# Patient Record
Sex: Female | Born: 1970 | Hispanic: No | Marital: Married | Smoking: Current every day smoker
Health system: Southern US, Community
[De-identification: ages and names within clinical notes are randomized; demographics above are authoritative.]

## PROBLEM LIST (undated history)

## (undated) DIAGNOSIS — N189 Chronic kidney disease, unspecified: Secondary | ICD-10-CM

## (undated) DIAGNOSIS — N3010 Interstitial cystitis (chronic) without hematuria: Secondary | ICD-10-CM

## (undated) DIAGNOSIS — G629 Polyneuropathy, unspecified: Secondary | ICD-10-CM

## (undated) DIAGNOSIS — D649 Anemia, unspecified: Secondary | ICD-10-CM

## (undated) DIAGNOSIS — J45909 Unspecified asthma, uncomplicated: Secondary | ICD-10-CM

## (undated) DIAGNOSIS — J189 Pneumonia, unspecified organism: Secondary | ICD-10-CM

## (undated) DIAGNOSIS — F1010 Alcohol abuse, uncomplicated: Secondary | ICD-10-CM

## (undated) DIAGNOSIS — I7300 Raynaud's syndrome without gangrene: Secondary | ICD-10-CM

## (undated) DIAGNOSIS — F552 Abuse of laxatives: Secondary | ICD-10-CM

## (undated) DIAGNOSIS — K219 Gastro-esophageal reflux disease without esophagitis: Secondary | ICD-10-CM

## (undated) HISTORY — PX: LEG SURGERY: SHX1003

## (undated) HISTORY — PX: FRACTURE SURGERY: SHX138

## (undated) HISTORY — PX: ESOPHAGOGASTRODUODENOSCOPY: SHX1529

## (undated) HISTORY — PX: COLONOSCOPY: SHX174

---

## 2014-11-18 DIAGNOSIS — N3010 Interstitial cystitis (chronic) without hematuria: Secondary | ICD-10-CM | POA: Diagnosis present

## 2014-12-31 DIAGNOSIS — F552 Abuse of laxatives: Secondary | ICD-10-CM | POA: Diagnosis present

## 2018-09-03 DIAGNOSIS — K5901 Slow transit constipation: Secondary | ICD-10-CM | POA: Insufficient documentation

## 2019-07-04 ENCOUNTER — Other Ambulatory Visit: Payer: Self-pay

## 2019-07-04 ENCOUNTER — Ambulatory Visit: Payer: Self-pay | Attending: Internal Medicine

## 2019-07-04 DIAGNOSIS — Z23 Encounter for immunization: Secondary | ICD-10-CM

## 2019-07-04 NOTE — Progress Notes (Signed)
   Covid-19 Vaccination Clinic  Name:  Katherine Perez    MRN: [id number] DOB: 03/15/1971  07/04/2019  [name] was observed post Covid-19 immunization for 15 minutes without incident. She was provided with Vaccine Information Sheet and instruction to access the V-Safe system.   [name] was instructed to call 911 with any severe reactions post vaccine: Marland Kitchen Difficulty breathing  . Swelling of face and throat  . A fast heartbeat  . A bad rash all over body  . Dizziness and weakness   Immunizations Administered    Name Date Dose VIS Date Route   Pfizer COVID-19 Vaccine 07/04/2019 10:01 AM 0.3 mL 04/03/2019 Intramuscular   Manufacturer: [place]   Lot: [id number]   [place]: [address]-0

## 2019-07-28 ENCOUNTER — Other Ambulatory Visit: Payer: Self-pay

## 2019-07-28 ENCOUNTER — Ambulatory Visit: Payer: Self-pay | Attending: Internal Medicine

## 2019-07-28 DIAGNOSIS — Z23 Encounter for immunization: Secondary | ICD-10-CM

## 2019-07-28 NOTE — Progress Notes (Signed)
   Covid-19 Vaccination Clinic  Name:  Katherine Perez    MRN: [id number] DOB: [birth year]  07/28/2019  Katherine Perez was observed post Covid-19 immunization for 15 minutes without incident. She was provided with Vaccine Information Sheet and instruction to access the V-Safe system.   Katherine Perez was instructed to call 911 with any severe reactions post vaccine: Marland Kitchen Difficulty breathing  . Swelling of face and throat  . A fast heartbeat  . A bad rash all over body  . Dizziness and weakness   Immunizations Administered    Name Date Dose VIS Date Route   Pfizer COVID-19 Vaccine 07/28/2019  3:12 PM 0.3 mL 04/03/2019 Intramuscular   Manufacturer: [place]   Lot: [id number]   [place]: [id number]

## 2019-10-26 ENCOUNTER — Other Ambulatory Visit: Payer: Self-pay

## 2019-10-26 ENCOUNTER — Inpatient Hospital Stay
Admission: EM | Admit: 2019-10-26 | Discharge: 2019-11-03 | DRG: 871 | Disposition: A | Discharge status: Home Health | Payer: BC Managed Care – PPO | Attending: Internal Medicine | Admitting: Internal Medicine

## 2019-10-26 ENCOUNTER — Emergency Department: Payer: BC Managed Care – PPO

## 2019-10-26 ENCOUNTER — Encounter: Payer: Self-pay | Admitting: Emergency Medicine

## 2019-10-26 ENCOUNTER — Inpatient Hospital Stay: Payer: BC Managed Care – PPO

## 2019-10-26 DIAGNOSIS — D72829 Elevated white blood cell count, unspecified: Secondary | ICD-10-CM | POA: Diagnosis not present

## 2019-10-26 DIAGNOSIS — Z79891 Long term (current) use of opiate analgesic: Secondary | ICD-10-CM

## 2019-10-26 DIAGNOSIS — N184 Chronic kidney disease, stage 4 (severe): Secondary | ICD-10-CM | POA: Diagnosis present

## 2019-10-26 DIAGNOSIS — E876 Hypokalemia: Secondary | ICD-10-CM | POA: Diagnosis present

## 2019-10-26 DIAGNOSIS — B961 Klebsiella pneumoniae [K. pneumoniae] as the cause of diseases classified elsewhere: Secondary | ICD-10-CM | POA: Diagnosis not present

## 2019-10-26 DIAGNOSIS — E872 Acidosis: Secondary | ICD-10-CM | POA: Diagnosis present

## 2019-10-26 DIAGNOSIS — J690 Pneumonitis due to inhalation of food and vomit: Secondary | ICD-10-CM | POA: Diagnosis present

## 2019-10-26 DIAGNOSIS — T39395A Adverse effect of other nonsteroidal anti-inflammatory drugs [NSAID], initial encounter: Secondary | ICD-10-CM | POA: Diagnosis present

## 2019-10-26 DIAGNOSIS — Z23 Encounter for immunization: Secondary | ICD-10-CM | POA: Diagnosis not present

## 2019-10-26 DIAGNOSIS — N179 Acute kidney failure, unspecified: Secondary | ICD-10-CM | POA: Diagnosis present

## 2019-10-26 DIAGNOSIS — Z681 Body mass index (BMI) 19 or less, adult: Secondary | ICD-10-CM | POA: Diagnosis not present

## 2019-10-26 DIAGNOSIS — K219 Gastro-esophageal reflux disease without esophagitis: Secondary | ICD-10-CM | POA: Diagnosis present

## 2019-10-26 DIAGNOSIS — E871 Hypo-osmolality and hyponatremia: Secondary | ICD-10-CM | POA: Diagnosis present

## 2019-10-26 DIAGNOSIS — F17210 Nicotine dependence, cigarettes, uncomplicated: Secondary | ICD-10-CM | POA: Diagnosis present

## 2019-10-26 DIAGNOSIS — Z20822 Contact with and (suspected) exposure to covid-19: Secondary | ICD-10-CM | POA: Diagnosis present

## 2019-10-26 DIAGNOSIS — R319 Hematuria, unspecified: Secondary | ICD-10-CM | POA: Diagnosis not present

## 2019-10-26 DIAGNOSIS — E875 Hyperkalemia: Secondary | ICD-10-CM | POA: Diagnosis not present

## 2019-10-26 DIAGNOSIS — R7881 Bacteremia: Secondary | ICD-10-CM

## 2019-10-26 DIAGNOSIS — A4159 Other Gram-negative sepsis: Principal | ICD-10-CM | POA: Diagnosis present

## 2019-10-26 DIAGNOSIS — N3011 Interstitial cystitis (chronic) with hematuria: Secondary | ICD-10-CM | POA: Diagnosis present

## 2019-10-26 DIAGNOSIS — J45909 Unspecified asthma, uncomplicated: Secondary | ICD-10-CM | POA: Diagnosis present

## 2019-10-26 DIAGNOSIS — K828 Other specified diseases of gallbladder: Secondary | ICD-10-CM | POA: Diagnosis present

## 2019-10-26 DIAGNOSIS — D509 Iron deficiency anemia, unspecified: Secondary | ICD-10-CM | POA: Diagnosis present

## 2019-10-26 DIAGNOSIS — B370 Candidal stomatitis: Secondary | ICD-10-CM | POA: Diagnosis present

## 2019-10-26 DIAGNOSIS — Z1624 Resistance to multiple antibiotics: Secondary | ICD-10-CM | POA: Diagnosis present

## 2019-10-26 DIAGNOSIS — E43 Unspecified severe protein-calorie malnutrition: Secondary | ICD-10-CM | POA: Diagnosis present

## 2019-10-26 DIAGNOSIS — N1831 Chronic kidney disease, stage 3a: Secondary | ICD-10-CM | POA: Diagnosis present

## 2019-10-26 DIAGNOSIS — G9341 Metabolic encephalopathy: Secondary | ICD-10-CM | POA: Diagnosis present

## 2019-10-26 DIAGNOSIS — E8729 Other acidosis: Secondary | ICD-10-CM | POA: Diagnosis present

## 2019-10-26 DIAGNOSIS — Z888 Allergy status to other drugs, medicaments and biological substances status: Secondary | ICD-10-CM

## 2019-10-26 DIAGNOSIS — F552 Abuse of laxatives: Secondary | ICD-10-CM | POA: Diagnosis present

## 2019-10-26 DIAGNOSIS — N390 Urinary tract infection, site not specified: Secondary | ICD-10-CM | POA: Diagnosis not present

## 2019-10-26 DIAGNOSIS — Z881 Allergy status to other antibiotic agents status: Secondary | ICD-10-CM

## 2019-10-26 DIAGNOSIS — N189 Chronic kidney disease, unspecified: Secondary | ICD-10-CM | POA: Diagnosis not present

## 2019-10-26 DIAGNOSIS — K5909 Other constipation: Secondary | ICD-10-CM | POA: Diagnosis present

## 2019-10-26 DIAGNOSIS — E860 Dehydration: Secondary | ICD-10-CM | POA: Diagnosis present

## 2019-10-26 DIAGNOSIS — Z1612 Extended spectrum beta lactamase (ESBL) resistance: Secondary | ICD-10-CM | POA: Diagnosis present

## 2019-10-26 DIAGNOSIS — Z87440 Personal history of urinary (tract) infections: Secondary | ICD-10-CM

## 2019-10-26 DIAGNOSIS — R109 Unspecified abdominal pain: Secondary | ICD-10-CM

## 2019-10-26 DIAGNOSIS — L8990 Pressure ulcer of unspecified site, unspecified stage: Secondary | ICD-10-CM | POA: Insufficient documentation

## 2019-10-26 DIAGNOSIS — L89151 Pressure ulcer of sacral region, stage 1: Secondary | ICD-10-CM | POA: Diagnosis present

## 2019-10-26 DIAGNOSIS — Z79899 Other long term (current) drug therapy: Secondary | ICD-10-CM

## 2019-10-26 DIAGNOSIS — Z7951 Long term (current) use of inhaled steroids: Secondary | ICD-10-CM

## 2019-10-26 DIAGNOSIS — R471 Dysarthria and anarthria: Secondary | ICD-10-CM | POA: Diagnosis present

## 2019-10-26 DIAGNOSIS — R6520 Severe sepsis without septic shock: Secondary | ICD-10-CM | POA: Diagnosis present

## 2019-10-26 DIAGNOSIS — I7300 Raynaud's syndrome without gangrene: Secondary | ICD-10-CM | POA: Diagnosis present

## 2019-10-26 DIAGNOSIS — Z7289 Other problems related to lifestyle: Secondary | ICD-10-CM

## 2019-10-26 HISTORY — DX: Chronic kidney disease, unspecified: N18.9

## 2019-10-26 HISTORY — DX: Anemia, unspecified: D64.9

## 2019-10-26 HISTORY — DX: Polyneuropathy, unspecified: G62.9

## 2019-10-26 HISTORY — DX: Alcohol abuse, uncomplicated: F10.10

## 2019-10-26 HISTORY — DX: Interstitial cystitis (chronic) without hematuria: N30.10

## 2019-10-26 HISTORY — DX: Abuse of laxatives: F55.2

## 2019-10-26 HISTORY — DX: Gastro-esophageal reflux disease without esophagitis: K21.9

## 2019-10-26 HISTORY — DX: Raynaud's syndrome without gangrene: I73.00

## 2019-10-26 LAB — CBC WITH DIFFERENTIAL/PLATELET
Abs Immature Granulocytes: 1.73 10*3/uL — ABNORMAL HIGH (ref 0.00–0.07)
Basophils Absolute: 0 10*3/uL (ref 0.0–0.1)
Basophils Relative: 0 %
Eosinophils Absolute: 0 10*3/uL (ref 0.0–0.5)
Eosinophils Relative: 0 %
HCT: 33.2 % — ABNORMAL LOW (ref 36.0–46.0)
Hemoglobin: 12.3 g/dL (ref 12.0–15.0)
Immature Granulocytes: 8 %
Lymphocytes Relative: 4 %
Lymphs Abs: 0.9 10*3/uL (ref 0.7–4.0)
MCH: 31.8 pg (ref 26.0–34.0)
MCHC: 37 g/dL — ABNORMAL HIGH (ref 30.0–36.0)
MCV: 85.8 fL (ref 80.0–100.0)
Monocytes Absolute: 1 10*3/uL (ref 0.1–1.0)
Monocytes Relative: 5 %
Neutro Abs: 17.9 10*3/uL — ABNORMAL HIGH (ref 1.7–7.7)
Neutrophils Relative %: 83 %
Platelets: 267 10*3/uL (ref 150–400)
RBC: 3.87 MIL/uL (ref 3.87–5.11)
RDW: 13.2 % (ref 11.5–15.5)
Smear Review: NORMAL
WBC: 21.6 10*3/uL — ABNORMAL HIGH (ref 4.0–10.5)
nRBC: 0.1 % (ref 0.0–0.2)

## 2019-10-26 LAB — HCG, QUANTITATIVE, PREGNANCY: hCG, Beta Chain, Quant, S: 3 m[IU]/mL (ref <5)

## 2019-10-26 LAB — LIPASE, BLOOD: Lipase: 24 U/L (ref 11–51)

## 2019-10-26 LAB — BASIC METABOLIC PANEL
Anion gap: 24 — ABNORMAL HIGH (ref 5–15)
BUN: 134 mg/dL — ABNORMAL HIGH (ref 6–20)
CO2: 7 mmol/L — ABNORMAL LOW (ref 22–32)
Calcium: 7.6 mg/dL — ABNORMAL LOW (ref 8.9–10.3)
Chloride: 87 mmol/L — ABNORMAL LOW (ref 98–111)
Creatinine, Ser: 4.95 mg/dL — ABNORMAL HIGH (ref 0.44–1.00)
GFR calc Af Amer: 11 mL/min — ABNORMAL LOW (ref >60)
GFR calc non Af Amer: 10 mL/min — ABNORMAL LOW (ref >60)
Glucose, Bld: 78 mg/dL (ref 70–99)
Potassium: 3.7 mmol/L (ref 3.5–5.1)
Sodium: 118 mmol/L — CL (ref 135–145)

## 2019-10-26 LAB — COMPREHENSIVE METABOLIC PANEL WITH GFR
ALT: 31 U/L (ref 0–44)
AST: 39 U/L (ref 15–41)
Albumin: 2.9 g/dL — ABNORMAL LOW (ref 3.5–5.0)
Alkaline Phosphatase: 177 U/L — ABNORMAL HIGH (ref 38–126)
Anion gap: 25 — ABNORMAL HIGH (ref 5–15)
BUN: 137 mg/dL — ABNORMAL HIGH (ref 6–20)
CO2: 7 mmol/L — ABNORMAL LOW (ref 22–32)
Calcium: 8.1 mg/dL — ABNORMAL LOW (ref 8.9–10.3)
Chloride: 86 mmol/L — ABNORMAL LOW (ref 98–111)
Creatinine, Ser: 4.88 mg/dL — ABNORMAL HIGH (ref 0.44–1.00)
GFR calc Af Amer: 11 mL/min — ABNORMAL LOW
GFR calc non Af Amer: 10 mL/min — ABNORMAL LOW
Glucose, Bld: 86 mg/dL (ref 70–99)
Potassium: 3.8 mmol/L (ref 3.5–5.1)
Sodium: 118 mmol/L — CL (ref 135–145)
Total Bilirubin: 1.6 mg/dL — ABNORMAL HIGH (ref 0.3–1.2)
Total Protein: 7.4 g/dL (ref 6.5–8.1)

## 2019-10-26 LAB — TROPONIN I (HIGH SENSITIVITY)
Troponin I (High Sensitivity): 28 ng/L — ABNORMAL HIGH (ref <18)
Troponin I (High Sensitivity): 29 ng/L — ABNORMAL HIGH (ref <18)

## 2019-10-26 LAB — URINALYSIS, COMPLETE (UACMP) WITH MICROSCOPIC
Bilirubin Urine: NEGATIVE
Glucose, UA: NEGATIVE mg/dL
Ketones, ur: 5 mg/dL — AB
Nitrite: POSITIVE — AB
Protein, ur: 30 mg/dL — AB
Specific Gravity, Urine: 1.008 (ref 1.005–1.030)
Squamous Epithelial / HPF: NONE SEEN (ref 0–5)
WBC, UA: >50 WBC/hpf — ABNORMAL HIGH (ref 0–5)
pH: 5 (ref 5.0–8.0)

## 2019-10-26 LAB — MAGNESIUM: Magnesium: 3 mg/dL — ABNORMAL HIGH (ref 1.7–2.4)

## 2019-10-26 LAB — SODIUM: Sodium: 118 mmol/L — CL (ref 135–145)

## 2019-10-26 LAB — LACTIC ACID, PLASMA
Lactic Acid, Venous: 0.9 mmol/L (ref 0.5–1.9)
Lactic Acid, Venous: 0.7 mmol/L (ref 0.5–1.9)

## 2019-10-26 LAB — ETHANOL: Alcohol, Ethyl (B): <10 mg/dL (ref <10)

## 2019-10-26 LAB — OSMOLALITY, URINE: Osmolality, Ur: 233 mosm/kg — ABNORMAL LOW (ref 300–900)

## 2019-10-26 LAB — SARS CORONAVIRUS 2 BY RT PCR (HOSPITAL ORDER, PERFORMED IN ~~LOC~~ HOSPITAL LAB): SARS Coronavirus 2: NEGATIVE

## 2019-10-26 LAB — AMMONIA: Ammonia: 16 umol/L (ref 9–35)

## 2019-10-26 LAB — GROUP A STREP BY PCR: Group A Strep by PCR: NOT DETECTED

## 2019-10-26 LAB — ACETAMINOPHEN LEVEL: Acetaminophen (Tylenol), Serum: 11 ug/mL (ref 10–30)

## 2019-10-26 LAB — PROCALCITONIN: Procalcitonin: 30.57 ng/mL

## 2019-10-26 LAB — SODIUM, URINE, RANDOM: Sodium, Ur: 27 mmol/L

## 2019-10-26 LAB — BETA-HYDROXYBUTYRIC ACID: Beta-Hydroxybutyric Acid: 2.23 mmol/L — ABNORMAL HIGH (ref 0.05–0.27)

## 2019-10-26 LAB — OSMOLALITY: Osmolality: 295 mOsm/kg (ref 275–295)

## 2019-10-26 LAB — SALICYLATE LEVEL: Salicylate Lvl: <7 mg/dL — ABNORMAL LOW (ref 7.0–30.0)

## 2019-10-26 LAB — BRAIN NATRIURETIC PEPTIDE: B Natriuretic Peptide: 230 pg/mL — ABNORMAL HIGH (ref 0.0–100.0)

## 2019-10-26 MED ORDER — FAMOTIDINE 20 MG PO TABS
40.0000 mg | ORAL_TABLET | Freq: Every day | ORAL | Status: DC
  Administered 2019-10-26: 40 mg via ORAL
  Filled 2019-10-26: qty 2

## 2019-10-26 MED ORDER — THEOPHYLLINE ER 400 MG PO TB24
400.0000 mg | ORAL_TABLET | Freq: Two times a day (BID) | ORAL | Status: DC
  Administered 2019-10-26: 400 mg via ORAL
  Filled 2019-10-26 (×3): qty 1

## 2019-10-26 MED ORDER — SODIUM BICARBONATE-DEXTROSE 150-5 MEQ/L-% IV SOLN
150.0000 meq | INTRAVENOUS | Status: DC
  Administered 2019-10-26: 150 meq via INTRAVENOUS
  Filled 2019-10-26 (×2): qty 1000

## 2019-10-26 MED ORDER — MOMETASONE FURO-FORMOTEROL FUM 100-5 MCG/ACT IN AERO
2.0000 | INHALATION_SPRAY | Freq: Two times a day (BID) | RESPIRATORY_TRACT | Status: DC
  Administered 2019-10-29 – 2019-11-03 (×11): 2 via RESPIRATORY_TRACT
  Filled 2019-10-26 (×2): qty 8.8

## 2019-10-26 MED ORDER — SODIUM CHLORIDE 0.9 % IV BOLUS
500.0000 mL | Freq: Once | INTRAVENOUS | Status: AC
  Administered 2019-10-26: 500 mL via INTRAVENOUS

## 2019-10-26 MED ORDER — THIAMINE HCL 100 MG PO TABS
100.0000 mg | ORAL_TABLET | Freq: Every day | ORAL | Status: DC
  Administered 2019-10-26 – 2019-11-03 (×8): 100 mg via ORAL
  Filled 2019-10-26 (×8): qty 1

## 2019-10-26 MED ORDER — SODIUM CHLORIDE 0.9 % IV SOLN
3.0000 g | Freq: Two times a day (BID) | INTRAVENOUS | Status: DC
  Administered 2019-10-26 – 2019-10-28 (×4): 3 g via INTRAVENOUS
  Filled 2019-10-26 (×3): qty 8
  Filled 2019-10-26: qty 3
  Filled 2019-10-26: qty 8

## 2019-10-26 MED ORDER — TAMSULOSIN HCL 0.4 MG PO CAPS
0.4000 mg | ORAL_CAPSULE | Freq: Every day | ORAL | Status: DC
  Administered 2019-10-26 – 2019-11-03 (×8): 0.4 mg via ORAL
  Filled 2019-10-26 (×8): qty 1

## 2019-10-26 MED ORDER — FLUCONAZOLE 100MG IVPB
100.0000 mg | INTRAVENOUS | Status: AC
  Administered 2019-10-26 – 2019-11-01 (×7): 100 mg via INTRAVENOUS
  Filled 2019-10-26 (×8): qty 50

## 2019-10-26 MED ORDER — HEPARIN SODIUM (PORCINE) 5000 UNIT/ML IJ SOLN
5000.0000 [IU] | Freq: Three times a day (TID) | INTRAMUSCULAR | Status: DC
  Administered 2019-10-26 – 2019-11-03 (×22): 5000 [IU] via SUBCUTANEOUS
  Filled 2019-10-26 (×22): qty 1

## 2019-10-26 MED ORDER — PLECANATIDE 3 MG PO TABS: 3.0000 mg | ORAL_TABLET | Freq: Every day | ORAL | Status: DC

## 2019-10-26 MED ORDER — SODIUM CHLORIDE 0.9 % IV BOLUS
250.0000 mL | Freq: Once | INTRAVENOUS | Status: AC
  Administered 2019-10-26: 250 mL via INTRAVENOUS

## 2019-10-26 MED ORDER — SODIUM CHLORIDE 0.45 % IV SOLN: INTRAVENOUS | Status: DC

## 2019-10-26 MED ORDER — SODIUM CHLORIDE 0.9% FLUSH
3.0000 mL | Freq: Two times a day (BID) | INTRAVENOUS | Status: DC
  Administered 2019-10-28 – 2019-11-01 (×7): 3 mL via INTRAVENOUS

## 2019-10-26 MED ORDER — NICOTINE 21 MG/24HR TD PT24
21.0000 mg | MEDICATED_PATCH | Freq: Every day | TRANSDERMAL | Status: DC
  Administered 2019-10-28 – 2019-11-03 (×7): 21 mg via TRANSDERMAL
  Filled 2019-10-26 (×8): qty 1

## 2019-10-26 MED ORDER — ONDANSETRON HCL 4 MG/2ML IJ SOLN: 4.0000 mg | Freq: Four times a day (QID) | INTRAMUSCULAR | Status: DC | PRN

## 2019-10-26 MED ORDER — ACETAMINOPHEN 325 MG PO TABS
650.0000 mg | ORAL_TABLET | Freq: Four times a day (QID) | ORAL | Status: DC | PRN
  Administered 2019-10-29: 650 mg via ORAL
  Filled 2019-10-26: qty 2

## 2019-10-26 MED ORDER — THIAMINE HCL 100 MG/ML IJ SOLN
100.0000 mg | Freq: Every day | INTRAMUSCULAR | Status: DC
  Administered 2019-10-27: 100 mg via INTRAVENOUS
  Filled 2019-10-26 (×2): qty 2

## 2019-10-26 MED ORDER — SODIUM CHLORIDE 0.9 % IV SOLN
1.0000 g | Freq: Once | INTRAVENOUS | Status: AC
  Administered 2019-10-26: 1 g via INTRAVENOUS
  Filled 2019-10-26: qty 1

## 2019-10-26 MED ORDER — ONDANSETRON HCL 4 MG PO TABS: 4.0000 mg | ORAL_TABLET | Freq: Four times a day (QID) | ORAL | Status: DC | PRN

## 2019-10-26 MED ORDER — LORAZEPAM 2 MG/ML IJ SOLN
1.0000 mg | INTRAMUSCULAR | Status: AC | PRN
  Administered 2019-10-27: 4 mg via INTRAVENOUS
  Filled 2019-10-26 (×2): qty 1

## 2019-10-26 MED ORDER — ADULT MULTIVITAMIN W/MINERALS CH
1.0000 | ORAL_TABLET | Freq: Every day | ORAL | Status: DC
  Administered 2019-10-26 – 2019-11-03 (×8): 1 via ORAL
  Filled 2019-10-26 (×8): qty 1

## 2019-10-26 MED ORDER — ALBUTEROL SULFATE (2.5 MG/3ML) 0.083% IN NEBU: 2.5000 mg | INHALATION_SOLUTION | RESPIRATORY_TRACT | Status: DC | PRN

## 2019-10-26 MED ORDER — FLUTICASONE PROPIONATE 50 MCG/ACT NA SUSP
2.0000 | Freq: Two times a day (BID) | NASAL | Status: DC
  Administered 2019-10-28 – 2019-11-03 (×12): 2 via NASAL
  Filled 2019-10-26 (×2): qty 16

## 2019-10-26 MED ORDER — ACETAMINOPHEN 650 MG RE SUPP: 650.0000 mg | Freq: Four times a day (QID) | RECTAL | Status: DC | PRN

## 2019-10-26 MED ORDER — SODIUM CHLORIDE 0.9 % IV SOLN: 2.0000 g | INTRAVENOUS | Status: DC

## 2019-10-26 MED ORDER — FOLIC ACID 1 MG PO TABS
1.0000 mg | ORAL_TABLET | Freq: Every day | ORAL | Status: DC
  Administered 2019-10-26 – 2019-11-03 (×8): 1 mg via ORAL
  Filled 2019-10-26 (×8): qty 1

## 2019-10-26 MED ORDER — LORAZEPAM 1 MG PO TABS
1.0000 mg | ORAL_TABLET | ORAL | Status: AC | PRN
  Administered 2019-10-26: 1 mg via ORAL
  Filled 2019-10-26: qty 1

## 2019-10-26 MED ORDER — PANTOPRAZOLE SODIUM 40 MG PO TBEC
40.0000 mg | DELAYED_RELEASE_TABLET | Freq: Every day | ORAL | Status: DC
  Administered 2019-10-26 – 2019-11-03 (×8): 40 mg via ORAL
  Filled 2019-10-26 (×8): qty 1

## 2019-10-26 NOTE — ED Triage Notes (Addendum)
Pt confused.  Mild dysarthria noted.  Pt feels has difficulty finding words at times.  fingers noted to be purple in color, when asked does have hx raynaud's. Pt poor historian, called husband to attempt to obtain symptoms and history; pt is [facility].  Here for [facility] per husband and confusion along with difficulty speaking.  Has been in bed past few days and not eating/drinkign well. 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  + fatigue.  Difficulty to obtain current history r/t pt mental status. Pt drinks 4-5 drinks per week per husband report. Skin color hard tell, pt uses lotion tanner.

## 2019-10-26 NOTE — Consult Note (Addendum)
[address] [place], [place] [address] Phone [phone]. Fax [phone]  Date: 10/26/2019                  Patient Name:  Katherine Perez  MRN: [id number]  DOB: 1970-10-03  Age / Sex: 49 y.o., female         PCP: Patient, No Pcp Per                 Service Requesting Consult: IM/ Modena Jansky, MD                 Reason for Consult: ARF            History of Present Illness: Patient is a 49 y.o. female with medical problems of multiple medical problems, who was admitted to [facility] on 10/26/2019 for evaluation of confusion and dysarthria.   Presented for dysarthryia and word finding difficulty. Fingers noted to be purple. Also concern of Shortness of breath and confusion. H/o laxative abuse. Ongoing alcohol abuse - 4-5 drinks per husband H/o colonic intertia, chronic constipation, pelvic floor dysfuncion, gastroparesis, malnutrition  + NSAIDs- diclonofenac-misoprostol started 09/25/2019  Most information is obtained from patient's husband and chart and primary team.  Patient is not able to provide a lot of information as she feels sick and had confusion earlier today.  Husband reports that she has improved; was much more confused this morning  Multiple lab abnormalities noted including severe acidosis, ARB and hyponatremia     Medications: Outpatient medications: (Not in a hospital admission)   Current medications: Current Facility-Administered Medications  Medication Dose Route Frequency Provider Last Rate Last Admin  . 0.45 % sodium chloride infusion   Intravenous Continuous Hongalgi, Anand D, MD      . acetaminophen (TYLENOL) tablet 650 mg  650 mg Oral Q6H PRN Hongalgi, Lenis Dickinson, MD       Or  . acetaminophen (TYLENOL) suppository 650 mg  650 mg Rectal Q6H PRN Hongalgi, Anand D, MD      . albuterol (PROVENTIL) (2.5 MG/3ML) 0.083% nebulizer solution 2.5 mg  2.5 mg Nebulization Q2H PRN Hongalgi, Anand D, MD      . heparin injection 5,000 Units  5,000 Units  Subcutaneous Q8H Hongalgi, Anand D, MD      . nicotine (NICODERM CQ - dosed in mg/24 hours) patch 21 mg  21 mg Transdermal Daily Hongalgi, Anand D, MD      . ondansetron (ZOFRAN) tablet 4 mg  4 mg Oral Q6H PRN Hongalgi, Lenis Dickinson, MD       Or  . ondansetron (ZOFRAN) injection 4 mg  4 mg Intravenous Q6H PRN Hongalgi, Anand D, MD      . sodium chloride flush (NS) 0.9 % injection 3 mL  3 mL Intravenous Q12H Hongalgi, Lenis Dickinson, MD       Current Outpatient Medications  Medication Sig Dispense Refill  . Calcium Polycarbophil (FIBER) 625 MG TABS Take 2 capsules by mouth 2 (two) times daily.    . Diclofenac-miSOPROStol 75-0.2 MG TBEC Take 1 tablet by mouth in the morning, at noon, and at bedtime.     . famotidine (PEPCID) 40 MG tablet Take 40 mg by mouth daily.    . fluticasone (FLONASE) 50 MCG/ACT nasal spray Place 2 sprays into both nostrils 2 (two) times daily.     . Fluticasone-Salmeterol (ADVAIR) 100-50 MCG/DOSE AEPB Inhale 1 puff into the lungs 2 (two) times daily.    . Fluticasone-Salmeterol (ADVAIR) 250-50 MCG/DOSE AEPB Inhale  1 puff into the lungs 2 (two) times daily.    Marland Kitchen gabapentin (NEURONTIN) 300 MG capsule Take 300 mg by mouth 5 (five) times daily as needed.    . metoCLOPramide (REGLAN) 10 MG tablet Take 20 mg by mouth in the morning and at bedtime.     . ondansetron (ZOFRAN) 8 MG tablet Take 16 mg by mouth 2 (two) times daily.     . promethazine (PHENERGAN) 25 MG tablet Take 25 mg by mouth every 8 (eight) hours as needed for nausea or vomiting.     . RABEprazole (ACIPHEX) 20 MG tablet Take 20 mg by mouth in the morning and at bedtime.     . tamsulosin (FLOMAX) 0.4 MG CAPS capsule Take 0.4 mg by mouth daily.    . theophylline (UNIPHYL) 400 MG 24 hr tablet Take 400 mg by mouth 2 (two) times daily.     Marland Kitchen trimethoprim (TRIMPEX) 100 MG tablet Take 100 mg by mouth daily.    . TRULANCE 3 MG TABS Take 3 mg by mouth daily at 6 (six) AM.     . albuterol (VENTOLIN HFA) 108 (90 Base) MCG/ACT inhaler  Inhale 2 puffs into the lungs every 4 (four) hours as needed.    . fexofenadine (ALLEGRA) 180 MG tablet Take 180 mg by mouth daily as needed for allergies.    Marland Kitchen psyllium (FIBER LAXATIVE) 0.52 g capsule Take 2 capsules by mouth 2 (two) times daily. (Patient not taking: Reported on 10/26/2019)    . traMADol (ULTRAM) 50 MG tablet Take 50 mg by mouth 3 (three) times daily as needed.        Allergies: Allergies  Allergen Reactions  . Cetirizine Hives  . Diazepam Other (See Comments)    Depression   . Baclofen Anxiety and Other (See Comments)    AMS - "Really messed me up, caused me to drop things"       Past Medical History: Past Medical History:  Diagnosis Date  . Anemia   . CKD (chronic kidney disease)   . ETOH abuse   . GERD (gastroesophageal reflux disease)   . IC (interstitial cystitis)   . Laxative abuse   . Neuropathy   . Raynaud disease      Past Surgical History: Past Surgical History:  Procedure Laterality Date  . LEG SURGERY       Family History: History reviewed. No pertinent family history.   Social History: Social History   Socioeconomic History  . Marital status: Married    Spouse name: Not on file  . Number of children: Not on file  . Years of education: Not on file  . Highest education level: Not on file  Occupational History  . Not on file  Tobacco Use  . Smoking status: Current Every Day Smoker  . Smokeless tobacco: Never Used  Substance and Sexual Activity  . Alcohol use: Yes    Comment: 4-5 drink per week  . Drug use: Not Currently  . Sexual activity: Not on file  Other Topics Concern  . Not on file  Social History Narrative  . Not on file   Social Determinants of Health   Financial Resource Strain:   . Difficulty of Paying Living Expenses:   Food Insecurity:   . Worried About Charity fundraiser in the Last Year:   . Arboriculturist in the Last Year:   Transportation Needs:   . Film/video editor (Medical):   Marland Kitchen Lack of  Transportation (  Non-Medical):   Physical Activity:   . Days of Exercise per Week:   . Minutes of Exercise per Session:   Stress:   . Feeling of Stress :   Social Connections:   . Frequency of Communication with Friends and Family:   . Frequency of Social Gatherings with Friends and Family:   . Attends Religious Services:   . Active Member of Clubs or Organizations:   . Attends Archivist Meetings:   Marland Kitchen Marital Status:   Intimate Partner Violence:   . Fear of Current or Ex-Partner:   . Emotionally Abused:   Marland Kitchen Physically Abused:   . Sexually Abused:      Review of Systems: not reliable Gen:  HEENT:  CV:  Resp:  GI: GU :  MS:  Derm:    Psych: Heme:  Neuro:  Endocrine  Vital Signs: Blood pressure 125/64, pulse (!) 105, temperature 97.7 F (36.5 C), temperature source Oral, resp. rate 18, height 5\' 9"  (1.753 m), weight 63.5 kg, SpO2 100 %.   Intake/Output Summary (Last 24 hours) at 10/26/2019 1646 Last data filed at 10/26/2019 1459 Gross per 24 hour  Intake 250 ml  Output --  Net 250 ml    Weight trends: [employer]   10/26/19 1128  Weight: 63.5 kg    Physical Exam: General:  alert, NAD  HEENT Dry oral mucus membranes  Neck:  supple, no JVD  Lungs: Coarse breath sounds b/l, room air  Heart::  regular  Abdomen: Soft, mild diffuse tenderness  Extremities:  no edema  Neurologic: Alert, able to answer questions appropriately  Skin: No acute rashes, fingertip cyanosis    Lab results: Basic Metabolic Panel: Recent Labs  Lab 10/26/19 1150 10/26/19 1303 10/26/19 1545  NA 118* 118* 118*  K 3.8  --  3.7  CL 86*  --  87*  CO2 7*  --  7*  GLUCOSE 86  --  78  BUN 137*  --  134*  CREATININE 4.88*  --  4.95*  CALCIUM 8.1*  --  7.6*  MG 3.0*  --   --     Liver Function Tests: Recent Labs  Lab 10/26/19 1150  AST 39  ALT 31  ALKPHOS 177*  BILITOT 1.6*  PROT 7.4  ALBUMIN 2.9*   Recent Labs  Lab 10/26/19 1150  LIPASE 24   Recent Labs   Lab 10/26/19 1150  AMMONIA 16    CBC: Recent Labs  Lab 10/26/19 1150  WBC 21.6*  NEUTROABS 17.9*  HGB 12.3  HCT 33.2*  MCV 85.8  PLT 267    Cardiac Enzymes: No results for input(s): CKTOTAL, TROPONINI in the last 168 hours.  BNP: Invalid input(s): POCBNP  CBG: No results for input(s): GLUCAP in the last 168 hours.  Microbiology: Recent Results (from the past 720 hour(s))  Group A Strep by PCR ([facility] Only)     Status: None   Collection Time: 10/26/19  1:20 PM   Specimen: Throat; Sterile Swab  Result Value Ref Range Status   Group A Strep by PCR NOT DETECTED NOT DETECTED Final    Comment: Performed at [address] [facility], [address] [place], [place] [address]  SARS Coronavirus 2 by RT PCR (hospital order, performed in [facility] hospital lab) Nasopharyngeal Nasopharyngeal Swab     Status: None   Collection Time: 10/26/19  1:40 PM   Specimen: Nasopharyngeal Swab  Result Value Ref Range Status   SARS Coronavirus 2 NEGATIVE NEGATIVE Final  Comment: (NOTE) SARS-CoV-2 target nucleic acids are NOT DETECTED.  The SARS-CoV-2 RNA is generally detectable in upper and lower respiratory specimens during the acute phase of infection. The lowest concentration of SARS-CoV-2 viral copies this assay can detect is 250 copies / mL. A negative result does not preclude SARS-CoV-2 infection and should not be used as the sole basis for treatment or other patient management decisions.  A negative result may occur with improper specimen collection / handling, submission of specimen other than nasopharyngeal swab, presence of viral mutation(s) within the areas targeted by this assay, and inadequate number of viral copies (<250 copies / mL). A negative result must be combined with clinical observations, patient history, and epidemiological information.  Fact Sheet for Patients:   StrictlyIdeas.no  Fact Sheet for Healthcare  Providers: BankingDealers.co.za  This test is not yet approved or  cleared by the [place] FDA and has been authorized for detection and/or diagnosis of SARS-CoV-2 by FDA under an Emergency Use Authorization (EUA).  This EUA will remain in effect (meaning this test can be used) for the duration of the COVID-19 declaration under Section 564(b)(1) of the Act, 21 U.S.C. section 360bbb-3(b)(1), unless the authorization is terminated or revoked sooner.  Performed at [facility], [address] [place], [place] [address]      Coagulation Studies: No results for input(s): LABPROT, INR in the last 72 hours.  Urinalysis: Recent Labs    10/26/19 1231  COLORURINE BIOCHEMICALS MAY BE AFFECTED BY COLOR*  LABSPEC 1.008  PHURINE 5.0  GLUCOSEU NEGATIVE  HGBUR MODERATE*  BILIRUBINUR NEGATIVE  KETONESUR 5*  PROTEINUR 30*  NITRITE POSITIVE*  LEUKOCYTESUR LARGE*        Imaging: DG Chest 2 View  Result Date: 10/26/2019 CLINICAL DATA:  Shortness of breath, slurred speech and dizziness. EXAM: CHEST - 2 VIEW COMPARISON:  None FINDINGS: Trachea midline. Cardiomediastinal contours and hilar structures are normal. Mild increased interstitial change in the lingula and likely in RIGHT middle lobe. No lobar level consolidative change.  No sign of pleural effusion. On limited assessment skeletal structures are unremarkable. IMPRESSION: Mild increased interstitial prominence without lobar consolidation predominantly in lingula and RIGHT middle lobe lower lobe. Correlate with any history of chronic infection. Findings could also be due to mild and or developing pneumonitis would also correlate for any risk factors for aspiration. Electronically Signed   By: Zetta Bills M.D.   On: 10/26/2019 12:54   CT Head Wo Contrast  Result Date: 10/26/2019 CLINICAL DATA:  Mental status change and dysarthria. EXAM: CT HEAD WITHOUT CONTRAST TECHNIQUE: Contiguous axial images were  obtained from the base of the skull through the vertex without intravenous contrast. COMPARISON:  None. FINDINGS: Brain: No evidence of acute infarction, hemorrhage, hydrocephalus, extra-axial collection or mass lesion/mass effect. Vascular: No hyperdense vessel or unexpected calcification. Skull: Normal. Negative for fracture or focal lesion. Sinuses/Orbits: No acute finding. Other: None. IMPRESSION: No focal acute intracranial abnormality identified. Electronically Signed   By: Abelardo Diesel M.D.   On: 10/26/2019 13:06     Assessment & Plan: Pt is a 49 y.o. 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, pelvic floor dysfunction, acid reflux, gastroparesis, iron deficiency anemia, malnutrition, alcohol abuse, tobacco abuse, anemia, asthma, chronic kidney disease, laxative abuse, recurrent UTIs, interstitial cystitis, Raynaud's disease, was admitted on 10/26/2019 with Hyponatremia [E87.1]   # ARF # Hyponatremia # Substance abuse - alcohol # heavy smoker 1.5 ppd # severe acidosis # pyuria (chronic interstitial cystitis)  ARF is likely multifactorial from volume depletion, possibly underlying infection and NSAIDs No recent iv contrast exposure -Baseline Cr 1.40/ GFR 45 from 09/23/2019 -obtain US renal - gentle volume repletion -Hold NSAIDs, avoid hypotension Hyponatremia is also multifactorial with contribution from volume depletion, alcohol abuse. Other ddx includes SIADH -Patient has received IV normal saline boluses but sodium has not improved - will correct underlying dehydration and treat with iv bicarb to correct acidosis - monitor Na frequently -goal of correction upto 124-126 by AM - if Na starts to go lower, would add 3% saline Withdrawal precautions as per ICU team Possible aspiration pneumonia treatment as per ICU Send urine for culture - patient has h/o interstitial cystitis- treat only if culture positive     LOS: 0 Patrik Turnbaugh Candiss Norse 7/5/20214:46  PM    Note: This note was prepared with Dragon dictation. Any transcription errors are unintentional

## 2019-10-26 NOTE — H&P (Addendum)
History and Physical    Katherine Perez ZSW:[id number] DOB: Dec 05, 1970 DOA: 10/26/2019  PCP: Patient, No Pcp Per   I have briefly reviewed patients previous medical reports in [facility].  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  Abuse, anemia, anorexia, asthma, stage III CKD, laxative abuse, recurrent UTI/interstitial cystitis, Raynaud's disease, ongoing tobacco abuse presented to [facility] ED with above complaints.  Patient and spouse at bedside provided history.  Patient has chronic GI symptoms but otherwise was in her usual state of health until 5 days ago when she started having decreased oral intake of both liquids and foods, decreased energy, progressive lethargy, mostly stayed in bed.  Both patient and spouse deny that she has been vomiting spontaneously or induced.  No history of diarrhea.  Chronic abdominal pain without change.  2 days PTA she noticed dyspnea, weak voice, mostly dry cough, chills but no fevers.  No chest pains.  Since last night spouse noticed that patient has been confused but no auditory or visual hallucinations.  No agitation.  Patient and spouse both deny her having suicidal or homicidal ideations.  Due to progressive symptoms as above, they presented to the ED for further evaluation and management  ED Course: Afebrile, not tachypneic, transient mild tachycardia, BP in the 150s/60s, not hypoxic.  Lab work shows multiple severe significant abnormalities: Sodium 118, chloride 86, bicarbonate 7, BUN 137,  creatinine 4.88 (1.4 about 4 weeks ago), anion gap 25, magnesium 3, lipase normal.  WBC 21.6, neutrophils 17.9.  BNP 230.  Troponin XX 8 > 29.  Lactate x2 normal.  Serum osmolarity 295.  Procalcitonin 30.57.  Beta hydroxybutyrate 2.23.  COVID-19 testing pending.  Urine microscopy: Large leukocyte, positive for nitrites, rare bacteria and >50 WBCs.  Group A strep PCR negative.  Urine culture pending.  And: No focal acute intracranial abnormality identified.  Chest x-ray: Mild increased interstitial prominence without lobar consolidation predominantly in lingula and right middle lobe lower lobe.  Review of Systems:  All other systems reviewed and apart from HPI, are negative.  Past Medical History:  Diagnosis Date  . Anemia   . CKD (chronic kidney disease)   . ETOH abuse   . GERD (gastroesophageal reflux disease)   . IC (interstitial cystitis)   . Laxative abuse   . Neuropathy   . Raynaud disease     Past Surgical History:  Procedure Laterality Date  . LEG SURGERY      Social History  reports that she has been smoking. She has never used smokeless tobacco. She reports current alcohol use. She reports previous drug use.  Allergies  Allergen Reactions  . Cetirizine Hives  . Diazepam Other (See Comments)    Depression   . Baclofen Anxiety and Other (See Comments)    AMS - "Really messed me up, caused me to drop things"     History reviewed. No pertinent family history.   Prior to Admission medications   Medication Sig Start Date End Date Taking? Authorizing Provider  Diclofenac-miSOPROStol 75-0.2 MG TBEC Take 1 tablet by mouth in the morning, at noon, and at bedtime.  09/25/19  Yes [provider]  famotidine (PEPCID) 40 MG tablet Take 40 mg by mouth daily. 10/07/19  Yes [provider]  fluticasone (FLONASE) 50 MCG/ACT nasal spray Place 2 sprays into both nostrils 2 (two) times daily.  09/14/19  Yes [provider]  gabapentin (NEURONTIN) 300 MG capsule  Take 300 mg by mouth 5 (five) times daily as needed. 10/07/19  Yes [provider]  metoCLOPramide (REGLAN) 10 MG tablet Take 20 mg by mouth in the morning and at bedtime.  10/17/19  Yes [provider]  ondansetron (ZOFRAN) 8 MG tablet Take 16 mg by mouth 2 (two) times daily.  10/23/19  Yes [provider]  promethazine (PHENERGAN) 25 MG tablet Take 25 mg by mouth every 8 (eight) hours as needed for nausea or vomiting.  10/23/19  Yes [provider]  RABEprazole (ACIPHEX) 20 MG tablet Take 20 mg by mouth in the morning and at bedtime.  10/12/19  Yes [provider]  tamsulosin (FLOMAX) 0.4 MG CAPS capsule Take 0.4 mg by mouth daily. 10/10/19  Yes [provider]  theophylline (UNIPHYL) 400 MG 24 hr tablet Take 400 mg by mouth 2 (two) times daily.  10/10/19  Yes [provider]  trimethoprim (TRIMPEX) 100 MG tablet Take 100 mg by mouth daily. 10/13/19  Yes [provider]  TRULANCE 3 MG TABS Take 3 mg by mouth daily at 6 (six) AM.  10/12/19  Yes [provider]  traMADol (ULTRAM) 50 MG tablet Take 50 mg by mouth 3 (three) times daily as needed. 10/03/19   [provider]    Physical Exam: Vitals:   10/26/19 1255 10/26/19 1400 10/26/19 1430 10/26/19 1554  BP:  (!) 152/82 (!) 152/66 125/64  Pulse: 93 98 (!) 102 (!) 105  Resp: 12 12 14 18   Temp:      TempSrc:      SpO2: 94% 100% 100% 100%  Weight:      Height:          Constitutional: Young female, moderately built, frail and chronically ill looking lying comfortably propped up in bed. Eyes: PERTLA, lids and conjunctivae normal ENMT: Mucous membranes extremely dry.  Tongue is beefy red. Posterior pharynx also dry and scaly. Normal dentition.  Neck: supple, no masses, no thyromegaly Respiratory: Slightly harsh breath sounds but no wheezing, rhonchi or crackles appreciated.  No increased work of breathing. Cardiovascular: S1 & S2 heard, regular rate and rhythm.  No JVD, murmurs, rubs or clicks. No pedal edema. Abdomen: Non distended. Non tender. Soft. No organomegaly or masses appreciated. No clinical Ascites. Normal bowel sounds heard. Musculoskeletal: no clubbing.  Fingertips are slightly dusky?  Related to her Raynaud's. No joint deformity upper and lower extremities. Good ROM, no contractures. Normal muscle tone.  Skin: no rashes, lesions, ulcers. No induration Neurologic: CN 2-12 grossly intact. Sensation intact, DTR normal. Strength 5/5 in all 4 limbs.  Psychiatric: Alert and oriented to person, place and partly to time.  Slightly impaired insight and judgment at this time.  Pleasant and cooperative..     Labs on Admission: I have personally reviewed following labs and imaging studies  CBC: Recent Labs  Lab 10/26/19 1150  WBC 21.6*  NEUTROABS 17.9*  HGB 12.3  HCT 33.2*  MCV 85.8  PLT 300    Basic Metabolic Panel: Recent Labs  Lab 10/26/19 1150 10/26/19 1303  NA 118* 118*  K 3.8  --   CL 86*  --   CO2 7*  --  GLUCOSE 86  --   BUN 137*  --   CREATININE 4.88*  --   CALCIUM 8.1*  --   MG 3.0*  --     Liver Function Tests: Recent Labs  Lab 10/26/19 1150  AST 39  ALT 31  ALKPHOS 177*  BILITOT 1.6*  PROT 7.4  ALBUMIN 2.9*    Urine analysis:    Component Value Date/Time   COLORURINE BIOCHEMICALS MAY BE AFFECTED BY COLOR (A) 10/26/2019 1231   APPEARANCEUR CLOUDY (A) 10/26/2019 1231   LABSPEC 1.008 10/26/2019 1231   PHURINE 5.0 10/26/2019 1231   GLUCOSEU NEGATIVE 10/26/2019 1231   HGBUR MODERATE (A) 10/26/2019 1231   BILIRUBINUR NEGATIVE 10/26/2019 1231   KETONESUR 5 (A) 10/26/2019 1231   PROTEINUR 30 (A) 10/26/2019 1231   NITRITE POSITIVE (A) 10/26/2019 1231   LEUKOCYTESUR LARGE (A) 10/26/2019 1231     Radiological Exams on Admission: DG Chest 2 View  Result Date: 10/26/2019 CLINICAL DATA:  Shortness of breath, slurred speech and dizziness. EXAM: CHEST - 2 VIEW COMPARISON:  None FINDINGS: Trachea midline.  Cardiomediastinal contours and hilar structures are normal. Mild increased interstitial change in the lingula and likely in RIGHT middle lobe. No lobar level consolidative change.  No sign of pleural effusion. On limited assessment skeletal structures are unremarkable. IMPRESSION: Mild increased interstitial prominence without lobar consolidation predominantly in lingula and RIGHT middle lobe lower lobe. Correlate with any history of chronic infection. Findings could also be due to mild and or developing pneumonitis would also correlate for any risk factors for aspiration. Electronically Signed   By: Zetta Bills M.D.   On: 10/26/2019 12:54   CT Head Wo Contrast  Result Date: 10/26/2019 CLINICAL DATA:  Mental status change and dysarthria. EXAM: CT HEAD WITHOUT CONTRAST TECHNIQUE: Contiguous axial images were obtained from the base of the skull through the vertex without intravenous contrast. COMPARISON:  None. FINDINGS: Brain: No evidence of acute infarction, hemorrhage, hydrocephalus, extra-axial collection or mass lesion/mass effect. Vascular: No hyperdense vessel or unexpected calcification. Skull: Normal. Negative for fracture or focal lesion. Sinuses/Orbits: No acute finding. Other: None. IMPRESSION: No focal acute intracranial abnormality identified. Electronically Signed   By: Abelardo Diesel M.D.   On: 10/26/2019 13:06    EKG: Independently reviewed.  Sinus tachycardia at 104/min, no acute findings, QTC 533 ms  Assessment/Plan Principal Problem:   Hyponatremia Active Problems:   High anion gap metabolic acidosis   Acute kidney injury superimposed on CKD ([place])   Dehydration   Acute metabolic encephalopathy   Leukocytosis     Hyponatremia: Sodium on 6/2: 134.  Presented with sodium of 118.  This could be acute or subacute (likely).  Multifactorial, likely due to dehydration from poor oral intake, ongoing free water intake and unclear if there is any history of intentional emesis or  catharsis.  Patient received normal saline bolus 750x1, repeat BMP still is 118.  IV hypertonic saline was ordered by EDP but canceled.  Patient has mild mental status changes but can carry on simple conversation and no focal deficits.  Ideally should be in ICU due to need for close monitoring and high risk for decompensation.  EDP and I communicated with CCM but unfortunately they have no beds and have couple of other patients still waiting in ED for ICU admission.  Thereby admitting to stepdown unit with frequent neuro checks q. hourly, close BMP monitoring q. hourly until sodium stabilizes.  I consulted critical care and Nephrology.  As discussed with nephrology,  goal is to correct serum sodium no faster than 8 to 10 mEq per 24 hours and would start with IV half normal saline at 75 mL per hour.  Acute on stage III chronic kidney disease: Creatinine was 1.4 on 6/2.  Now presents with creatinine of 4.88.  Most likely secondary to dehydration, home NSAID use, rule out obstruction.  IV fluids and follow BMP closely.  Strict intake output and daily weights.  No indications for acute dialysis.  Nephrology consulted.  Check renal ultrasound.  Hold NSAIDs.  High anion gap metabolic acidosis: Secondary to acute kidney injury and dehydration.  Lactate normal.  Elevated beta hydroxybutyrate.  Treat acute kidney injury as above, monitor BMP q. hourly.  If does not improve then may need to consider a bicarbonate drip.  Suspected aspiration pneumonia: Likely related to mental status changes.  Change to IV Unasyn per pharmacy.  Possible UTI complicating chronic interstitial cystitis: Follow urine culture results.  Changed to IV Unasyn per pharmacy.  Leukocytosis: Likely secondary to pneumonia, UTI and stress response.  Follow CBC daily.  Chronic constipation/anorexia: Bowel regimen for home.  PPI  Asthma: Stable without clinical bronchospasm.  As needed albuterol nebs.  Continue Advair/Dulera, also on  theophylline.  Tobacco abuse: Cessation counseled.  Nicotine patch per request.  Alcohol use: CIWA protocol.  Prolonged QTC: Avoid QT prolonging medications as much as possible.  Follow on telemetry.  Periodically check EKG.  Potassium is 3.8.  Magnesium is 3.   Addendum: Despite saline bolus in ED, sodium persists at 118, nephrology has evaluated and recommend sodium bicarbonate drip for slow correction of low sodium and also the acidosis and indicate that her mental status is already better.  DVT prophylaxis: Subcutaneous heparin Code Status: Full, confirmed with spouse at bedside Family Communication: Patient was evaluated with spouse at bedside at all times.  Updated care and answered questions Disposition Plan:   Patient is from:  Home  Anticipated DC to:  Home  Anticipated DC date:  To be determined  Anticipated DC barriers: To be determined   Consults called: Nephrology, CCM Admission status: Stepdown, inpatient  Severity of Illness: The appropriate patient status for this patient is INPATIENT. Inpatient status is judged to be reasonable and necessary in order to provide the required intensity of service to ensure the patient's safety. The patient's presenting symptoms, physical exam findings, and initial radiographic and laboratory data in the context of their chronic comorbidities is felt to place them at high risk for further clinical deterioration. Furthermore, it is not anticipated that the patient will be medically stable for discharge from the hospital within 2 midnights of admission. The following factors support the patient status of inpatient.   " The patient's presenting symptoms include poor oral intake, decreased energy, lethargy, dyspnea, cough and altered mental. " The worrisome physical exam findings include mucosa, slightly disoriented. " The initial radiographic and laboratory data are worrisome because of sodium 118, creatinine greater than 4, bicarbonate  7. " The chronic co-morbidities include anorexia, chronic constipation, stage III chronic kidney disease.   * I certify that at the point of admission it is my clinical judgment that the patient will require inpatient hospital care spanning beyond 2 midnights from the point of admission due to high intensity of service, high risk for further deterioration and high frequency of surveillance required.Vernell Leep MD [facility]  To contact the attending provider between 7A-7P or the covering provider during after hours 7P-7A, please  log into the web site [email or web address] and access using universal [place] password for that web site. If you do not have the password, please call the hospital operator.  10/26/2019, 4:16 PM

## 2019-10-26 NOTE — Consult Note (Signed)
Pharmacy Antibiotic Note  Katherine Perez is a 49 y.o. female admitted on 10/26/2019 with aspiration pneumoniaand uti.    Pharmacy has been consulted for Cefepime dosing.  Plan: Will dose Cefepime 2g q24h  Height: 5\' 9"  (175.3 cm) Weight: 63.5 kg (140 lb) IBW/kg (Calculated) : 66.2  Temp (24hrs), Avg:97.7 F (36.5 C), Min:97.7 F (36.5 C), Max:97.7 F (36.5 C)  Recent Labs  Lab 10/26/19 1150 10/26/19 1309 10/26/19 1447 10/26/19 1545  WBC 21.6*  --   --   --   CREATININE 4.88*  --   --  4.95*  LATICACIDVEN  --  0.9 0.7  --     Estimated Creatinine Clearance: 13.9 mL/min (A) (by C-G formula based on SCr of 4.95 mg/dL (H)).    Allergies  Allergen Reactions  . Cetirizine Hives  . Diazepam Other (See Comments)    Depression   . Baclofen Anxiety and Other (See Comments)    AMS - "Really messed me up, caused me to drop things"     Antimicrobials this admission: Cefepime 7/5 >>  Dose adjustments this admission: None  Microbiology results: 7/5 BCx: pending 7/5 UCx: pending  COVID NEG Group A Strep not detected  Thank you for allowing pharmacy to be a part of this patient's care.  Lu Duffel, PharmD, BCPS Clinical Pharmacist 10/26/2019 5:07 PM

## 2019-10-26 NOTE — Consult Note (Signed)
Pharmacy Antibiotic Note  Katherine Perez is a 49 y.o. female admitted on 10/26/2019 with aspiration pneumonia and uti.  Pharmacy has been consulted for Unasyn dosing.  Patient with AKI not on HD.  Plan: Cefepime changed to Unasyn 3g q12h.  Height: 5\' 9"  (175.3 cm) Weight: 63.5 kg (140 lb) IBW/kg (Calculated) : 66.2  Temp (24hrs), Avg:97.7 F (36.5 C), Min:97.7 F (36.5 C), Max:97.7 F (36.5 C)  Recent Labs  Lab 10/26/19 1150 10/26/19 1309 10/26/19 1447 10/26/19 1545  WBC 21.6*  --   --   --   CREATININE 4.88*  --   --  4.95*  LATICACIDVEN  --  0.9 0.7  --     Estimated Creatinine Clearance: 13.9 mL/min (A) (by C-G formula based on SCr of 4.95 mg/dL (H)).    Allergies  Allergen Reactions  . Cetirizine Hives  . Diazepam Other (See Comments)    Depression   . Baclofen Anxiety and Other (See Comments)    AMS - "Really messed me up, caused me to drop things"     Antimicrobials this admission: Cefepime 1g IV x 1   Unasyn 7/5 >>  Dose adjustments this admission: None  Microbiology results: 7/5 BCx: pending 7/5 UCx: pending  COVID NEG Group A Strep not detected  Thank you for allowing pharmacy to be a part of this patient's care.  Lu Duffel, PharmD, BCPS Clinical Pharmacist 10/26/2019 6:15 PM

## 2019-10-26 NOTE — ED Notes (Addendum)
Pt placed on 2L [place] per Dr Cinda Quest. Seizure pads placed on side rails. Suction and oxygen available at bedside.

## 2019-10-26 NOTE — ED Provider Notes (Addendum)
[facility] Emergency Department Provider Note   ____________________________________________   First MD Initiated Contact with Patient 10/26/19 1252     (approximate)  I have reviewed the triage vital signs and the nursing notes.   HISTORY  Chief Complaint Weakness    HPI Katherine Perez is a 49 y.o. female who comes in with intermittent confusion and some mild dysarthria blue fingers.  Patient's husband is here with her.  She is somewhat short of breath and has been in bed for the last few days not eating or drinking well.  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  Has been think she might still be using laxatives.  Patient drinks 4-5 drinks per week per her husband.  O2 sats on the earlobe 100% on the singular 88%.         Past Medical History:  Diagnosis Date  . Anemia   . CKD (chronic kidney disease)   . ETOH abuse   . GERD (gastroesophageal reflux disease)   . IC (interstitial cystitis)   . Laxative abuse   . Neuropathy   . Raynaud disease     There are no problems to display for this patient.   Past Surgical History:  Procedure Laterality Date  . LEG SURGERY      Prior to Admission medications   Not on File    Allergies Baclofen, Diazepam, and Zyrtec [cetirizine]  History reviewed. No pertinent family history.  Social History Social History   Tobacco Use  . Smoking status: Current Every Day Smoker  . Smokeless tobacco: Never Used  Substance Use Topics  . Alcohol use: Yes    Comment: 4-5 drink per week  . Drug use: Not Currently    Review of Systems  Constitutional: No fever she does complain of chills Eyes: No visual changes. ENT: No sore throat. Cardiovascular: Denies chest pain. Respiratory: Denies shortness of breath. Gastrointestinal: No abdominal pain.  No nausea, no vomiting.  No diarrhea.  No constipation. Genitourinary: Negative for dysuria. Musculoskeletal: Negative for  back pain. Skin: Negative for rash. Neurological: Negative for headaches, focal weakness   ____________________________________________   PHYSICAL EXAM:  VITAL SIGNS: ED Triage Vitals  Enc Vitals Group     BP 10/26/19 1130 (!) 154/74     Pulse Rate 10/26/19 1130 80     Resp 10/26/19 1130 (!) 24     Temp 10/26/19 1130 97.7 F (36.5 C)     Temp Source 10/26/19 1130 Oral     SpO2 10/26/19 1130 95 %     Weight 10/26/19 1128 140 lb (63.5 kg)     Height 10/26/19 1128 5\' 9"  (1.753 m)     Head Circumference --      Peak Flow --      Pain Score 10/26/19 1124 6     Pain Loc --      Pain Edu? --      Excl. in [place]? --     Constitutional: Currently alert and oriented. Well appearing and in no acute distress. Eyes: Conjunctivae are normal. PERRL. EOMI. Head: Atraumatic. Nose: No congestion/rhinnorhea. Mouth/Throat: Mucous membranes are dry oropharynx non-erythematous but dry. Neck: No stridor. Cardiovascular: Normal rate, regular rhythm. Grossly normal heart sounds.  Good peripheral circulation. Respiratory: Normal respiratory effort.  No retractions. Lungs scattered occasional wheezes Gastrointestinal: Soft and nontender. No distention. No abdominal bruits. No CVA tenderness. Musculoskeletal: No lower extremity tenderness nor edema. Neurologic:  Normal speech and language. No  gross focal neurologic deficits are appreciated. Skin:  Skin is warm, dry and intact. No rash noted.  Fingertips are blue as noted above   ____________________________________________   LABS (all labs ordered are listed, but only abnormal results are displayed)  Labs Reviewed  CBC WITH DIFFERENTIAL/PLATELET - Abnormal; Notable for the following components:      Result Value   WBC 21.6 (*)    HCT 33.2 (*)    MCHC 37.0 (*)    Neutro Abs 17.9 (*)    Abs Immature Granulocytes 1.73 (*)    All other components within normal limits  COMPREHENSIVE METABOLIC PANEL - Abnormal; Notable for the following  components:   Sodium 118 (*)    Chloride 86 (*)    CO2 7 (*)    BUN 137 (*)    Creatinine, Ser 4.88 (*)    Calcium 8.1 (*)    Albumin 2.9 (*)    Alkaline Phosphatase 177 (*)    Total Bilirubin 1.6 (*)    GFR calc non Af Amer 10 (*)    GFR calc Af Amer 11 (*)    Anion gap 25 (*)    All other components within normal limits  URINALYSIS, COMPLETE (UACMP) WITH MICROSCOPIC - Abnormal; Notable for the following components:   Color, Urine BIOCHEMICALS MAY BE AFFECTED BY COLOR (*)    APPearance CLOUDY (*)    Hgb urine dipstick MODERATE (*)    Ketones, ur 5 (*)    Protein, ur 30 (*)    Nitrite POSITIVE (*)    Leukocytes,Ua LARGE (*)    WBC, UA >50 (*)    Bacteria, UA RARE (*)    All other components within normal limits  MAGNESIUM - Abnormal; Notable for the following components:   Magnesium 3.0 (*)    All other components within normal limits  GROUP A STREP BY PCR  URINE CULTURE  CULTURE, BLOOD (ROUTINE X 2)  CULTURE, BLOOD (ROUTINE X 2)  LIPASE, BLOOD  AMMONIA  SODIUM  SODIUM  SODIUM  OSMOLALITY  OSMOLALITY, URINE  SODIUM, URINE, RANDOM  PROCALCITONIN  BRAIN NATRIURETIC PEPTIDE  LACTIC ACID, PLASMA  LACTIC ACID, PLASMA  BLOOD GAS, VENOUS  THEOPHYLLINE LEVEL  ETHANOL  ACETAMINOPHEN LEVEL  SALICYLATE LEVEL  VOLATILES,BLD-ACETONE,ETHANOL,ISOPROP,METHANOL  BETA-HYDROXYBUTYRIC ACID  TROPONIN I (HIGH SENSITIVITY)   ____________________________________________  EKG  EKG read interpreted by me shows sinus tachycardia rate of 104 normal axis no acute ST-T changes ____________________________________________  RADIOLOGY  ED MD interpretation: Chest x-ray shows some interstitial prominence in the lingula and the right middle lobe.  This could be infectious.  CT read by radiology as no acute disease.  I reviewed both of these films.  Official radiology report(s): DG Chest 2 View  Result Date: 10/26/2019 CLINICAL DATA:  Shortness of breath, slurred speech and dizziness.  EXAM: CHEST - 2 VIEW COMPARISON:  None FINDINGS: Trachea midline. Cardiomediastinal contours and hilar structures are normal. Mild increased interstitial change in the lingula and likely in RIGHT middle lobe. No lobar level consolidative change.  No sign of pleural effusion. On limited assessment skeletal structures are unremarkable. IMPRESSION: Mild increased interstitial prominence without lobar consolidation predominantly in lingula and RIGHT middle lobe lower lobe. Correlate with any history of chronic infection. Findings could also be due to mild and or developing pneumonitis would also correlate for any risk factors for aspiration. Electronically Signed   By: Zetta Bills M.D.   On: 10/26/2019 12:54   CT Head Wo Contrast  Result Date:  10/26/2019 CLINICAL DATA:  Mental status change and dysarthria. EXAM: CT HEAD WITHOUT CONTRAST TECHNIQUE: Contiguous axial images were obtained from the base of the skull through the vertex without intravenous contrast. COMPARISON:  None. FINDINGS: Brain: No evidence of acute infarction, hemorrhage, hydrocephalus, extra-axial collection or mass lesion/mass effect. Vascular: No hyperdense vessel or unexpected calcification. Skull: Normal. Negative for fracture or focal lesion. Sinuses/Orbits: No acute finding. Other: None. IMPRESSION: No focal acute intracranial abnormality identified. Electronically Signed   By: Abelardo Diesel M.D.   On: 10/26/2019 13:06    ____________________________________________   PROCEDURES  Procedure(s) performed (including Critical Care): Critical care time 30 minutes.  This includes reviewing the patient's old records speaking with the patient and husband and talking to the hospitalist and examining the patient.  Procedures   ____________________________________________   INITIAL IMPRESSION / ASSESSMENT AND PLAN / ED COURSE  Patient is hyponatremic with hypermagnesemia and AKI with an elevated white blood count.  She has a slight  cough.  She has chills but no fever.  She has urinary frequency.  She has a UTI she appears to be dehydrated.  She also is hyponatremic.  I will give her some IV fluids and some hyperosmolar saline as well.  She smells ketotic and likely has starvation ketosis.  She has a history consistent with the likelihood of starvation ketosis.  We will have to get her in the hospital.              ____________________________________________   FINAL CLINICAL IMPRESSION(S) / ED DIAGNOSES  Final diagnoses:  Dehydration  AKI (acute kidney injury) ([place])  Hyponatremia  Urinary tract infection with hematuria, site unspecified     ED Discharge Orders    None       Note:  This document was prepared using Dragon voice recognition software and may include unintentional dictation errors.    Nena Polio, MD 10/26/19 1420    Nena Polio, MD 10/26/19 1421    Nena Polio, MD 10/26/19 1423 ----------------------------------------- 3:04 PM on 10/26/2019 -----------------------------------------  Discussed patient with hospitalist who wanted me to talk to the intensivist as the hospitalist was worried about acute decompensation.  I spoke with the intensivist who reviewed the patient's chart and told me that there are no beds in the ICU currently and the patient would then have to stay in the ER.  He thought the patient probably could go to stepdown without any difficulty.  I called the and internal medicine hospitalist back again and relayed this information to them went over the patient with him in some detail.  He will come down and evaluate the patient himself.   Nena Polio, MD 10/26/19 [phone]

## 2019-10-26 NOTE — Consult Note (Addendum)
CRITICAL CARE PROGRESS NOTE    Name: Katherine Perez MRN: [id number] DOB: 05-28-1970     LOS: 0   SUBJECTIVE FINDINGS & SIGNIFICANT EVENTS    Patient description:  61 female admitted with altered mental status with confusion as well as acrocyanosis, mild shortness of breath, history of anorexia and bulimia with laxative abuse.  Patient admits to malnourishment as well as active alcoholism however states this has improved over the last 10 years.  She has a history of recurrent urinary tract infections and states that she has not been without a urinary infection last 6 months.  She does not have a history of recent weight loss and reports an improvement in her weight over the last 1 month however has not been able to eat over the last 1 week.  Generally patient is Pescatarian and eats fish for protein. She had mother with breast and ovarian cancer. She is actively smoking 1.5packs daily.  She has significant oral thrush extending down past uvula on exam.   Lines/tubes :   Microbiology/Sepsis markers: Results for orders placed or performed during the hospital encounter of 10/26/19  Group A Strep by PCR ([place] Only)     Status: None   Collection Time: 10/26/19  1:20 PM   Specimen: Throat; Sterile Swab  Result Value Ref Range Status   Group A Strep by PCR NOT DETECTED NOT DETECTED Final    Comment: Performed at [facility], [address] [place], [place] [address]    Anti-infectives:  Anti-infectives (From admission, onward)   Start     Dose/Rate Route Frequency Ordered Stop   10/26/19 1315  ceFEPIme (MAXIPIME) 1 g in sodium chloride 0.9 % 100 mL IVPB        1 g 200 mL/hr over 30 Minutes Intravenous  Once 10/26/19 1308 10/26/19 1441        PAST MEDICAL HISTORY   Past Medical History:  Diagnosis  Date  . Anemia   . CKD (chronic kidney disease)   . ETOH abuse   . GERD (gastroesophageal reflux disease)   . IC (interstitial cystitis)   . Laxative abuse   . Neuropathy   . Raynaud disease      SURGICAL HISTORY   Past Surgical History:  Procedure Laterality Date  . LEG SURGERY       FAMILY HISTORY   History reviewed. No pertinent family history.   SOCIAL HISTORY   Social History   Tobacco Use  . Smoking status: Current Every Day Smoker  . Smokeless tobacco: Never Used  Substance Use Topics  . Alcohol use: Yes    Comment: 4-5 drink per week  . Drug use: Not Currently     MEDICATIONS   Current Medication:  Current Facility-Administered Medications:  .  0.45 % sodium chloride infusion, , Intravenous, Continuous, Hongalgi, Anand D, MD .  acetaminophen (TYLENOL) tablet 650 mg, 650 mg, Oral, Q6H PRN **OR** acetaminophen (TYLENOL) suppository 650 mg, 650 mg, Rectal, Q6H PRN, Hongalgi, Anand D, MD .  albuterol (PROVENTIL) (2.5 MG/3ML) 0.083% nebulizer solution 2.5 mg, 2.5 mg, Nebulization, Q2H PRN, Hongalgi, Anand D, MD .  heparin injection 5,000 Units, 5,000 Units, Subcutaneous, Q8H, Hongalgi, Anand D, MD .  nicotine (NICODERM CQ - dosed in mg/24 hours) patch 21 mg, 21 mg, Transdermal, Daily, Hongalgi, Anand D, MD .  ondansetron (ZOFRAN) tablet 4 mg, 4 mg, Oral, Q6H PRN **OR** ondansetron (ZOFRAN) injection 4 mg, 4 mg, Intravenous, Q6H PRN, Modena Jansky, MD .  sodium chloride flush (NS) 0.9 % injection 3 mL, 3 mL, Intravenous, Q12H, Hongalgi, Lenis Dickinson, MD  Current Outpatient Medications:  .  Diclofenac-miSOPROStol 75-0.2 MG TBEC, Take 1 tablet by mouth in the morning, at noon, and at bedtime. , Disp: , Rfl:  .  famotidine (PEPCID) 40 MG tablet, Take 40 mg by mouth daily., Disp: , Rfl:  .  fluticasone (FLONASE) 50 MCG/ACT nasal spray, Place 2 sprays into both nostrils 2 (two) times daily. , Disp: , Rfl:  .  gabapentin (NEURONTIN) 300 MG capsule, Take 300 mg by  mouth 5 (five) times daily as needed., Disp: , Rfl:  .  metoCLOPramide (REGLAN) 10 MG tablet, Take 20 mg by mouth in the morning and at bedtime. , Disp: , Rfl:  .  ondansetron (ZOFRAN) 8 MG tablet, Take 16 mg by mouth 2 (two) times daily. , Disp: , Rfl:  .  promethazine (PHENERGAN) 25 MG tablet, Take 25 mg by mouth every 8 (eight) hours as needed for nausea or vomiting. , Disp: , Rfl:  .  RABEprazole (ACIPHEX) 20 MG tablet, Take 20 mg by mouth in the morning and at bedtime. , Disp: , Rfl:  .  tamsulosin (FLOMAX) 0.4 MG CAPS capsule, Take 0.4 mg by mouth daily., Disp: , Rfl:  .  theophylline (UNIPHYL) 400 MG 24 hr tablet, Take 400 mg by mouth 2 (two) times daily. , Disp: , Rfl:  .  trimethoprim (TRIMPEX) 100 MG tablet, Take 100 mg by mouth daily., Disp: , Rfl:  .  TRULANCE 3 MG TABS, Take 3 mg by mouth daily at 6 (six) AM. , Disp: , Rfl:  .  traMADol (ULTRAM) 50 MG tablet, Take 50 mg by mouth 3 (three) times daily as needed., Disp: , Rfl:     ALLERGIES   Cetirizine, Diazepam, and Baclofen    REVIEW OF SYSTEMS    10 point ROS done and is negative except    PHYSICAL EXAMINATION   Vital Signs: Temp:  [97.7 F (36.5 C)] 97.7 F (36.5 C) (07/05 1130) Pulse Rate:  [80-105] 105 (07/05 1554) Resp:  [12-24] 18 (07/05 1554) BP: (125-156)/(64-82) 125/64 (07/05 1554) SpO2:  [94 %-100 %] 100 % (07/05 1554) Weight:  [63.5 kg] 63.5 kg (07/05 1128)  GENERAL:NAD mild confusion HEAD: Normocephalic, atraumatic.  EYES: Pupils equal, round, reactive to light.  No scleral icterus.  MOUTH: Moist mucosal membrane. NECK: Supple. No thyromegaly. No nodules. No JVD.  PULMONARY: CTAB CARDIOVASCULAR: S1 and S2. Regular rate and rhythm. No murmurs, rubs, or gallops.  GASTROINTESTINAL: Soft, nontender, non-distended. No masses. Positive bowel sounds. No hepatosplenomegaly.  MUSCULOSKELETAL: No swelling, clubbing, or edema.  NEUROLOGIC: Mild distress due to acute  illness SKIN:intact,warm,dry   PERTINENT DATA     Infusions: . sodium chloride     Scheduled Medications: . heparin  5,000 Units Subcutaneous Q8H  . nicotine  21 mg Transdermal Daily  . sodium chloride flush  3 mL Intravenous Q12H   PRN Medications: acetaminophen **OR** acetaminophen, albuterol, ondansetron **OR** ondansetron (ZOFRAN) IV Hemodynamic parameters:   Intake/Output: No intake/output data recorded.  Ventilator  Settings:      LAB RESULTS:  Basic Metabolic Panel: Recent Labs  Lab 10/26/19 1150 10/26/19 1303  NA 118* 118*  K 3.8  --   CL 86*  --   CO2 7*  --   GLUCOSE 86  --   BUN 137*  --   CREATININE 4.88*  --   CALCIUM 8.1*  --   MG  3.0*  --    Liver Function Tests: Recent Labs  Lab 10/26/19 1150  AST 39  ALT 31  ALKPHOS 177*  BILITOT 1.6*  PROT 7.4  ALBUMIN 2.9*   Recent Labs  Lab 10/26/19 1150  LIPASE 24   Recent Labs  Lab 10/26/19 1150  AMMONIA 16   CBC: Recent Labs  Lab 10/26/19 1150  WBC 21.6*  NEUTROABS 17.9*  HGB 12.3  HCT 33.2*  MCV 85.8  PLT 267   Cardiac Enzymes: No results for input(s): CKTOTAL, CKMB, CKMBINDEX, TROPONINI in the last 168 hours. BNP: Invalid input(s): POCBNP CBG: No results for input(s): GLUCAP in the last 168 hours.     IMAGING RESULTS:  Imaging: DG Chest 2 View  Result Date: 10/26/2019 CLINICAL DATA:  Shortness of breath, slurred speech and dizziness. EXAM: CHEST - 2 VIEW COMPARISON:  None FINDINGS: Trachea midline. Cardiomediastinal contours and hilar structures are normal. Mild increased interstitial change in the lingula and likely in RIGHT middle lobe. No lobar level consolidative change.  No sign of pleural effusion. On limited assessment skeletal structures are unremarkable. IMPRESSION: Mild increased interstitial prominence without lobar consolidation predominantly in lingula and RIGHT middle lobe lower lobe. Correlate with any history of chronic infection. Findings could also be  due to mild and or developing pneumonitis would also correlate for any risk factors for aspiration. Electronically Signed   By: Zetta Bills M.D.   On: 10/26/2019 12:54   CT Head Wo Contrast  Result Date: 10/26/2019 CLINICAL DATA:  Mental status change and dysarthria. EXAM: CT HEAD WITHOUT CONTRAST TECHNIQUE: Contiguous axial images were obtained from the base of the skull through the vertex without intravenous contrast. COMPARISON:  None. FINDINGS: Brain: No evidence of acute infarction, hemorrhage, hydrocephalus, extra-axial collection or mass lesion/mass effect. Vascular: No hyperdense vessel or unexpected calcification. Skull: Normal. Negative for fracture or focal lesion. Sinuses/Orbits: No acute finding. Other: None. IMPRESSION: No focal acute intracranial abnormality identified. Electronically Signed   By: Abelardo Diesel M.D.   On: 10/26/2019 13:06   @PROBHOSP @ DG Chest 2 View  Result Date: 10/26/2019 CLINICAL DATA:  Shortness of breath, slurred speech and dizziness. EXAM: CHEST - 2 VIEW COMPARISON:  None FINDINGS: Trachea midline. Cardiomediastinal contours and hilar structures are normal. Mild increased interstitial change in the lingula and likely in RIGHT middle lobe. No lobar level consolidative change.  No sign of pleural effusion. On limited assessment skeletal structures are unremarkable. IMPRESSION: Mild increased interstitial prominence without lobar consolidation predominantly in lingula and RIGHT middle lobe lower lobe. Correlate with any history of chronic infection. Findings could also be due to mild and or developing pneumonitis would also correlate for any risk factors for aspiration. Electronically Signed   By: Zetta Bills M.D.   On: 10/26/2019 12:54   CT Head Wo Contrast  Result Date: 10/26/2019 CLINICAL DATA:  Mental status change and dysarthria. EXAM: CT HEAD WITHOUT CONTRAST TECHNIQUE: Contiguous axial images were obtained from the base of the skull through the vertex  without intravenous contrast. COMPARISON:  None. FINDINGS: Brain: No evidence of acute infarction, hemorrhage, hydrocephalus, extra-axial collection or mass lesion/mass effect. Vascular: No hyperdense vessel or unexpected calcification. Skull: Normal. Negative for fracture or focal lesion. Sinuses/Orbits: No acute finding. Other: None. IMPRESSION: No focal acute intracranial abnormality identified. Electronically Signed   By: Abelardo Diesel M.D.   On: 10/26/2019 13:06       ASSESSMENT AND PLAN    -Multidisciplinary rounds held today   Altered mental status  with confusion  - due to hyponatremia with toxic metabolic encephalopathy and intercurrent UTI with interstitial nephritis -slow correction of Na per nephro  COPD with centrilobular emphysema   -continue home Advair   - no signs of exacerbation during examination today   Aspiration pneumonia   - hx of EtOH   -Unasyn    -monitor signs of whithdrawal   - CIWA protocol   - thiamine/folate repletion   Oral thrush   - HCV/HIV eval    - patient also reports hx of vaginal yeast infection    - Diflucan 100 IV daily x5d    Severe protein calorie malnutrition   -low albumin   - bitemporal and peripheral muscle wasting.    ID -continue IV abx as prescibed -follow up cultures  GI/Nutrition GI PROPHYLAXIS as indicated DIET-->TF's as tolerated Constipation protocol as indicated  ENDO - ICU hypoglycemic\Hyperglycemia protocol -check FSBS per protocol   ELECTROLYTES -follow labs as needed -replace as needed -pharmacy consultation   DVT/GI PRX ordered -SCDs  TRANSFUSIONS AS NEEDED MONITOR FSBS ASSESS the need for LABS as needed   Critical care provider statement:    Critical care time (minutes):  34   Critical care time was exclusive of:  Separately billable procedures and treating other patients   Critical care was necessary to treat or prevent imminent or life-threatening deterioration of the following  conditions:  Altered mental status, confusion, AKI, anorexia, severe protein cal malnutrition   Critical care was time spent personally by me on the following activities:  Development of treatment plan with patient or surrogate, discussions with consultants, evaluation of patient's response to treatment, examination of patient, obtaining history from patient or surrogate, ordering and performing treatments and interventions, ordering and review of laboratory studies and re-evaluation of patient's condition.  I assumed direction of critical care for this patient from another provider in my specialty: no    This document was prepared using Dragon voice recognition software and may include unintentional dictation errors.    Ottie Glazier, M.D.  Division of [facility]

## 2019-10-27 DIAGNOSIS — L8990 Pressure ulcer of unspecified site, unspecified stage: Secondary | ICD-10-CM | POA: Insufficient documentation

## 2019-10-27 LAB — BASIC METABOLIC PANEL
Anion gap: 23 — ABNORMAL HIGH (ref 5–15)
Anion gap: 23 — ABNORMAL HIGH (ref 5–15)
Anion gap: 22 — ABNORMAL HIGH (ref 5–15)
Anion gap: 24 — ABNORMAL HIGH (ref 5–15)
BUN: 127 mg/dL — ABNORMAL HIGH (ref 6–20)
BUN: 134 mg/dL — ABNORMAL HIGH (ref 6–20)
BUN: 133 mg/dL — ABNORMAL HIGH (ref 6–20)
BUN: 134 mg/dL — ABNORMAL HIGH (ref 6–20)
CO2: 7 mmol/L — ABNORMAL LOW (ref 22–32)
CO2: 9 mmol/L — ABNORMAL LOW (ref 22–32)
CO2: 12 mmol/L — ABNORMAL LOW (ref 22–32)
CO2: 9 mmol/L — ABNORMAL LOW (ref 22–32)
Calcium: 7.2 mg/dL — ABNORMAL LOW (ref 8.9–10.3)
Calcium: 7.3 mg/dL — ABNORMAL LOW (ref 8.9–10.3)
Calcium: 6.6 mg/dL — ABNORMAL LOW (ref 8.9–10.3)
Calcium: 7.1 mg/dL — ABNORMAL LOW (ref 8.9–10.3)
Chloride: 87 mmol/L — ABNORMAL LOW (ref 98–111)
Chloride: 88 mmol/L — ABNORMAL LOW (ref 98–111)
Chloride: 87 mmol/L — ABNORMAL LOW (ref 98–111)
Chloride: 89 mmol/L — ABNORMAL LOW (ref 98–111)
Creatinine, Ser: 4.94 mg/dL — ABNORMAL HIGH (ref 0.44–1.00)
Creatinine, Ser: 4.95 mg/dL — ABNORMAL HIGH (ref 0.44–1.00)
Creatinine, Ser: 4.58 mg/dL — ABNORMAL HIGH (ref 0.44–1.00)
Creatinine, Ser: 4.78 mg/dL — ABNORMAL HIGH (ref 0.44–1.00)
GFR calc Af Amer: 11 mL/min — ABNORMAL LOW (ref >60)
GFR calc Af Amer: 11 mL/min — ABNORMAL LOW (ref >60)
GFR calc Af Amer: 12 mL/min — ABNORMAL LOW (ref >60)
GFR calc Af Amer: 12 mL/min — ABNORMAL LOW (ref >60)
GFR calc non Af Amer: 10 mL/min — ABNORMAL LOW (ref >60)
GFR calc non Af Amer: 10 mL/min — ABNORMAL LOW (ref >60)
GFR calc non Af Amer: 10 mL/min — ABNORMAL LOW (ref >60)
GFR calc non Af Amer: 11 mL/min — ABNORMAL LOW (ref >60)
Glucose, Bld: 76 mg/dL (ref 70–99)
Glucose, Bld: 85 mg/dL (ref 70–99)
Glucose, Bld: 110 mg/dL — ABNORMAL HIGH (ref 70–99)
Glucose, Bld: 82 mg/dL (ref 70–99)
Potassium: 3.4 mmol/L — ABNORMAL LOW (ref 3.5–5.1)
Potassium: 3.6 mmol/L (ref 3.5–5.1)
Potassium: 2.4 mmol/L — CL (ref 3.5–5.1)
Potassium: 3.1 mmol/L — ABNORMAL LOW (ref 3.5–5.1)
Sodium: 118 mmol/L — CL (ref 135–145)
Sodium: 119 mmol/L — CL (ref 135–145)
Sodium: 118 mmol/L — CL (ref 135–145)
Sodium: 125 mmol/L — ABNORMAL LOW (ref 135–145)

## 2019-10-27 LAB — BLOOD GAS, VENOUS
Acid-base deficit: 11.5 mmol/L — ABNORMAL HIGH (ref 0.0–2.0)
Bicarbonate: 12.4 mmol/L — ABNORMAL LOW (ref 20.0–28.0)
O2 Saturation: 88.5 %
Patient temperature: 37
pCO2, Ven: 22 mmHg — ABNORMAL LOW (ref 44.0–60.0)
pH, Ven: 7.36 (ref 7.250–7.430)
pO2, Ven: 58 mmHg — ABNORMAL HIGH (ref 32.0–45.0)

## 2019-10-27 LAB — COMPREHENSIVE METABOLIC PANEL
ALT: 24 U/L (ref 0–44)
AST: 37 U/L (ref 15–41)
Albumin: 2.2 g/dL — ABNORMAL LOW (ref 3.5–5.0)
Alkaline Phosphatase: 144 U/L — ABNORMAL HIGH (ref 38–126)
Anion gap: 25 — ABNORMAL HIGH (ref 5–15)
BUN: 138 mg/dL — ABNORMAL HIGH (ref 6–20)
CO2: 10 mmol/L — ABNORMAL LOW (ref 22–32)
Calcium: 6.9 mg/dL — ABNORMAL LOW (ref 8.9–10.3)
Chloride: 88 mmol/L — ABNORMAL LOW (ref 98–111)
Creatinine, Ser: 4.71 mg/dL — ABNORMAL HIGH (ref 0.44–1.00)
GFR calc Af Amer: 12 mL/min — ABNORMAL LOW (ref >60)
GFR calc non Af Amer: 10 mL/min — ABNORMAL LOW (ref >60)
Glucose, Bld: 93 mg/dL (ref 70–99)
Potassium: 2.8 mmol/L — ABNORMAL LOW (ref 3.5–5.1)
Sodium: 123 mmol/L — ABNORMAL LOW (ref 135–145)
Total Bilirubin: 1.8 mg/dL — ABNORMAL HIGH (ref 0.3–1.2)
Total Protein: 5.9 g/dL — ABNORMAL LOW (ref 6.5–8.1)

## 2019-10-27 LAB — CBC
HCT: 27.3 % — ABNORMAL LOW (ref 36.0–46.0)
Hemoglobin: 10.1 g/dL — ABNORMAL LOW (ref 12.0–15.0)
MCH: 31.4 pg (ref 26.0–34.0)
MCHC: 37 g/dL — ABNORMAL HIGH (ref 30.0–36.0)
MCV: 84.8 fL (ref 80.0–100.0)
Platelets: 228 10*3/uL (ref 150–400)
RBC: 3.22 MIL/uL — ABNORMAL LOW (ref 3.87–5.11)
RDW: 12.4 % (ref 11.5–15.5)
WBC: 17.6 10*3/uL — ABNORMAL HIGH (ref 4.0–10.5)
nRBC: 0 % (ref 0.0–0.2)

## 2019-10-27 LAB — MRSA PCR SCREENING: MRSA by PCR: NEGATIVE

## 2019-10-27 LAB — HIV ANTIBODY (ROUTINE TESTING W REFLEX): HIV Screen 4th Generation wRfx: NONREACTIVE

## 2019-10-27 LAB — SODIUM
Sodium: 129 mmol/L — ABNORMAL LOW (ref 135–145)
Sodium: 131 mmol/L — ABNORMAL LOW (ref 135–145)

## 2019-10-27 LAB — PHOSPHORUS: Phosphorus: 7.2 mg/dL — ABNORMAL HIGH (ref 2.5–4.6)

## 2019-10-27 LAB — OSMOLALITY, URINE: Osmolality, Ur: 233 mOsm/kg — ABNORMAL LOW (ref 300–900)

## 2019-10-27 LAB — MAGNESIUM: Magnesium: 2.3 mg/dL (ref 1.7–2.4)

## 2019-10-27 LAB — SODIUM, URINE, RANDOM: Sodium, Ur: 27 mmol/L

## 2019-10-27 MED ORDER — SODIUM BICARBONATE-DEXTROSE 150-5 MEQ/L-% IV SOLN
150.0000 meq | INTRAVENOUS | Status: DC
  Administered 2019-10-27 – 2019-10-28 (×3): 150 meq via INTRAVENOUS
  Filled 2019-10-27 (×4): qty 1000

## 2019-10-27 MED ORDER — SODIUM CHLORIDE 3 % IV SOLN
INTRAVENOUS | Status: DC
  Filled 2019-10-27 (×2): qty 500

## 2019-10-27 MED ORDER — POTASSIUM CHLORIDE 10 MEQ/100ML IV SOLN
10.0000 meq | INTRAVENOUS | Status: AC
  Administered 2019-10-27 (×4): 10 meq via INTRAVENOUS
  Filled 2019-10-27 (×4): qty 100

## 2019-10-27 MED ORDER — POTASSIUM CHLORIDE CRYS ER 20 MEQ PO TBCR
40.0000 meq | EXTENDED_RELEASE_TABLET | Freq: Once | ORAL | Status: DC
  Filled 2019-10-27: qty 2

## 2019-10-27 MED ORDER — CHLORHEXIDINE GLUCONATE CLOTH 2 % EX PADS
6.0000 | MEDICATED_PAD | Freq: Every day | CUTANEOUS | Status: DC
  Administered 2019-10-27 – 2019-11-03 (×6): 6 via TOPICAL

## 2019-10-27 MED ORDER — FAMOTIDINE 20 MG PO TABS
20.0000 mg | ORAL_TABLET | Freq: Every day | ORAL | Status: DC
  Administered 2019-10-28 – 2019-11-03 (×7): 20 mg via ORAL
  Filled 2019-10-27 (×7): qty 1

## 2019-10-27 MED ORDER — SODIUM CHLORIDE 0.9% FLUSH
10.0000 mL | Freq: Two times a day (BID) | INTRAVENOUS | Status: DC
  Administered 2019-10-27 – 2019-11-01 (×6): 10 mL

## 2019-10-27 MED ORDER — SODIUM BICARBONATE 8.4 % IV SOLN
INTRAVENOUS | Status: AC
  Filled 2019-10-27: qty 50

## 2019-10-27 MED ORDER — SODIUM CHLORIDE 0.9% FLUSH: 10.0000 mL | INTRAVENOUS | Status: DC | PRN

## 2019-10-27 MED ORDER — SODIUM BICARBONATE 8.4 % IV SOLN
100.0000 meq | Freq: Once | INTRAVENOUS | Status: AC
  Administered 2019-10-27: 100 meq via INTRAVENOUS
  Filled 2019-10-27: qty 50

## 2019-10-27 NOTE — Progress Notes (Signed)
Pt resting in bed quietly with no s/s of distress at this time. 3% hypertonic saline and bicarb gtt infusing. Pt required ativan once throughout shift per CIWA protocol. Pt has been very lethargic since ativan given this afternoon. VSS on room air. Safety sitter remains at bedside. Husband has visited twice today.

## 2019-10-27 NOTE — ED Notes (Signed)
Pt assisted with sliding up in bed. Pt slid self up on own and HOB elevated for comfort

## 2019-10-27 NOTE — Progress Notes (Signed)
Patient tried to get up again. Bed alarm on. RN and Charge RN attempted to redirect patient. Patient insisting to go to bathroom and get up. Placed bedpan under patient. Even with bedpan on, patient tried several times to get out of bed. RN at bedside during that time and tried to redirect patient. Patient unable to pee with bedpan. Bedpan removed. With bed alarm on, RN fetched bladder scanner. Patient's bed alarm went off and the patient was halfway out of the bed with legs dangling. Patient did not fall. Pulled the patient back to bed. Bladder scanned patient and found 643 mL urine in bladder. NP notified about situation. Received new orders for intermittent catheter and a Air cabin crew. Got out 600 mL urine from the intermittent catheter.

## 2019-10-27 NOTE — Consult Note (Signed)
CRITICAL CARE PROGRESS NOTE    Name: Katherine Perez MRN: [id number] DOB: 1970-09-07     LOS: 1   SUBJECTIVE FINDINGS & SIGNIFICANT EVENTS    Patient description:  23 female admitted with altered mental status with confusion as well as acrocyanosis, mild shortness of breath, history of anorexia and bulimia with laxative abuse.  Patient admits to malnourishment as well as active alcoholism however states this has improved over the last 10 years.  She has a history of recurrent urinary tract infections and states that she has not been without a urinary infection last 6 months.  She does not have a history of recent weight loss and reports an improvement in her weight over the last 1 month however has not been able to eat over the last 1 week.  Generally patient is Pescatarian and eats fish for protein. She had mother with breast and ovarian cancer. She is actively smoking 1.5packs daily.  She has significant oral thrush extending down past uvula on exam.   Lines/tubes : 3NS  Microbiology/Sepsis markers: Results for orders placed or performed during the hospital encounter of 10/26/19  Group A Strep by PCR ([facility] Only)     Status: None   Collection Time: 10/26/19  1:20 PM   Specimen: Throat; Sterile Swab  Result Value Ref Range Status   Group A Strep by PCR NOT DETECTED NOT DETECTED Final    Comment: Performed at [facility], [address] [place], [place] [address]  SARS Coronavirus 2 by RT PCR (hospital order, performed in [facility] hospital lab) Nasopharyngeal Nasopharyngeal Swab     Status: None   Collection Time: 10/26/19  1:40 PM   Specimen: Nasopharyngeal Swab  Result Value Ref Range Status   SARS Coronavirus 2 NEGATIVE NEGATIVE Final    Comment: (NOTE) SARS-CoV-2 target nucleic acids are NOT  DETECTED.  The SARS-CoV-2 RNA is generally detectable in upper and lower respiratory specimens during the acute phase of infection. The lowest concentration of SARS-CoV-2 viral copies this assay can detect is 250 copies / mL. A negative result does not preclude SARS-CoV-2 infection and should not be used as the sole basis for treatment or other patient management decisions.  A negative result may occur with improper specimen collection / handling, submission of specimen other than nasopharyngeal swab, presence of viral mutation(s) within the areas targeted by this assay, and inadequate number of viral copies (<250 copies / mL). A negative result must be combined with clinical observations, patient history, and epidemiological information.  Fact Sheet for Patients:   StrictlyIdeas.no  Fact Sheet for Healthcare Providers: BankingDealers.co.za  This test is not yet approved or  cleared by the [place] FDA and has been authorized for detection and/or diagnosis of SARS-CoV-2 by FDA under an Emergency Use Authorization (EUA).  This EUA will remain in effect (meaning this test can be used) for the duration of the COVID-19 declaration under Section 564(b)(1) of the Act, 21 U.S.C. section 360bbb-3(b)(1), unless the authorization is terminated or revoked sooner.  Performed at [facility], [address] [place], [place] [address]   Culture, blood (routine x 2)     Status: None (Preliminary result)   Collection Time: 10/26/19  1:41 PM   Specimen: BLOOD  Result Value Ref Range Status   Specimen Description BLOOD RIGHT ANTECUBITAL  Final   Special Requests   Final    BOTTLES DRAWN AEROBIC AND ANAEROBIC Blood Culture adequate volume   Culture   Final  NO GROWTH < 24 HOURS Performed at [facility], [address] [place], [place] [address]    Report Status PENDING  Incomplete  Culture, blood (routine x 2)      Status: None (Preliminary result)   Collection Time: 10/26/19  1:41 PM   Specimen: BLOOD  Result Value Ref Range Status   Specimen Description BLOOD BLOOD LEFT FOREARM  Final   Special Requests   Final    BOTTLES DRAWN AEROBIC AND ANAEROBIC Blood Culture adequate volume   Culture   Final    NO GROWTH < 24 HOURS Performed at [facility], [address] [place], [place] [address]    Report Status PENDING  Incomplete  MRSA PCR Screening     Status: None   Collection Time: 10/27/19  2:34 AM   Specimen: Nasopharyngeal  Result Value Ref Range Status   MRSA by PCR NEGATIVE NEGATIVE Final    Comment:        The GeneXpert MRSA Assay (FDA approved for NASAL specimens only), is one component of a comprehensive MRSA colonization surveillance program. It is not intended to diagnose MRSA infection nor to guide or monitor treatment for MRSA infections. Performed at [facility], [address], [place], [place] [address]     Anti-infectives:  Anti-infectives (From admission, onward)   Start     Dose/Rate Route Frequency Ordered Stop   10/26/19 2200  ceFEPIme (MAXIPIME) 2 g in sodium chloride 0.9 % 100 mL IVPB  Status:  Discontinued        2 g 200 mL/hr over 30 Minutes Intravenous Every 24 hours 10/26/19 1708 10/26/19 1751   10/26/19 2000  Ampicillin-Sulbactam (UNASYN) 3 g in sodium chloride 0.9 % 100 mL IVPB     Discontinue     3 g 200 mL/hr over 30 Minutes Intravenous Every 12 hours 10/26/19 1817     10/26/19 1900  fluconazole (DIFLUCAN) IVPB 100 mg     Discontinue     100 mg 50 mL/hr over 60 Minutes Intravenous Every 24 hours 10/26/19 1757     10/26/19 1315  ceFEPIme (MAXIPIME) 1 g in sodium chloride 0.9 % 100 mL IVPB        1 g 200 mL/hr over 30 Minutes Intravenous  Once 10/26/19 1308 10/26/19 1441        PAST MEDICAL HISTORY   Past Medical History:  Diagnosis Date  . Anemia   . CKD (chronic kidney disease)   . ETOH abuse   . GERD (gastroesophageal  reflux disease)   . IC (interstitial cystitis)   . Laxative abuse   . Neuropathy   . Raynaud disease      SURGICAL HISTORY   Past Surgical History:  Procedure Laterality Date  . LEG SURGERY       FAMILY HISTORY   History reviewed. No pertinent family history.   SOCIAL HISTORY   Social History   Tobacco Use  . Smoking status: Current Every Day Smoker  . Smokeless tobacco: Never Used  Substance Use Topics  . Alcohol use: Yes    Comment: 4-5 drink per week  . Drug use: Not Currently     MEDICATIONS   Current Medication:  Current Facility-Administered Medications:  .  acetaminophen (TYLENOL) tablet 650 mg, 650 mg, Oral, Q6H PRN **OR** acetaminophen (TYLENOL) suppository 650 mg, 650 mg, Rectal, Q6H PRN, Hongalgi, Anand D, MD .  albuterol (PROVENTIL) (2.5 MG/3ML) 0.083% nebulizer solution 2.5 mg, 2.5 mg, Nebulization, Q2H PRN, Hongalgi, Anand  D, MD .  Ampicillin-Sulbactam (UNASYN) 3 g in sodium chloride 0.9 % 100 mL IVPB, 3 g, Intravenous, Q12H, Lu Duffel, RPH, Stopped at 10/27/19 [phone] .  Chlorhexidine Gluconate Cloth 2 % PADS 6 each, 6 each, Topical, Q0600, Sharion Settler, NP, 6 each at 10/27/19 0226 .  [START ON 10/28/2019] famotidine (PEPCID) tablet 20 mg, 20 mg, Oral, Daily, Dallie Piles, RPH .  fluconazole (DIFLUCAN) IVPB 100 mg, 100 mg, Intravenous, Q24H, Ottie Glazier, MD, Paused at 10/26/19 2155 .  fluticasone (FLONASE) 50 MCG/ACT nasal spray 2 spray, 2 spray, Each Nare, BID, Hongalgi, Anand D, MD .  folic acid (FOLVITE) tablet 1 mg, 1 mg, Oral, Daily, Hongalgi, Anand D, MD, 1 mg at 10/26/19 1853 .  heparin injection 5,000 Units, 5,000 Units, Subcutaneous, Q8H, Modena Jansky, MD, 5,000 Units at 10/27/19 0946 .  LORazepam (ATIVAN) tablet 1-4 mg, 1-4 mg, Oral, Q1H PRN, 1 mg at 10/26/19 2052 **OR** LORazepam (ATIVAN) injection 1-4 mg, 1-4 mg, Intravenous, Q1H PRN, Hongalgi, Anand D, MD, 4 mg at 10/27/19 1019 .  mometasone-formoterol (DULERA) 100-5  MCG/ACT inhaler 2 puff, 2 puff, Inhalation, BID, Hongalgi, Anand D, MD .  multivitamin with minerals tablet 1 tablet, 1 tablet, Oral, Daily, Hongalgi, Lenis Dickinson, MD, 1 tablet at 10/26/19 1852 .  nicotine (NICODERM CQ - dosed in mg/24 hours) patch 21 mg, 21 mg, Transdermal, Daily, Hongalgi, Anand D, MD .  ondansetron (ZOFRAN) tablet 4 mg, 4 mg, Oral, Q6H PRN **OR** ondansetron (ZOFRAN) injection 4 mg, 4 mg, Intravenous, Q6H PRN, Hongalgi, Anand D, MD .  pantoprazole (PROTONIX) EC tablet 40 mg, 40 mg, Oral, Daily, Hongalgi, Anand D, MD, 40 mg at 10/26/19 1852 .  Plecanatide TABS 3 mg, 3 mg, Oral, Q0600, Hongalgi, Anand D, MD .  potassium chloride 10 mEq in 100 mL IVPB, 10 mEq, Intravenous, Q1 Hr x 4, Singh, Harmeet, MD, Last Rate: 100 mL/hr at 10/27/19 1209, 10 mEq at 10/27/19 1209 .  potassium chloride SA (KLOR-CON) CR tablet 40 mEq, 40 mEq, Oral, Once, Sharion Settler, NP .  sodium bicarbonate 150 mEq in dextrose 5% 1000 mL infusion, 150 mEq, Intravenous, Continuous, Blakeney, Dreama Saa, NP, Last Rate: 100 mL/hr at 10/27/19 1112, Rate Verify at 10/27/19 1112 .  sodium chloride (hypertonic) 3 % solution, , Intravenous, Continuous, Hall, Scott A, RPH, Last Rate: 25 mL/hr at 10/27/19 1112, Rate Verify at 10/27/19 1112 .  sodium chloride flush (NS) 0.9 % injection 10-40 mL, 10-40 mL, Intracatheter, Q12H, Hongalgi, Anand D, MD, 10 mL at 10/27/19 0140 .  sodium chloride flush (NS) 0.9 % injection 10-40 mL, 10-40 mL, Intracatheter, PRN, Hongalgi, Anand D, MD .  sodium chloride flush (NS) 0.9 % injection 3 mL, 3 mL, Intravenous, Q12H, Hongalgi, Anand D, MD .  tamsulosin (FLOMAX) capsule 0.4 mg, 0.4 mg, Oral, Daily, Hongalgi, Anand D, MD, 0.4 mg at 10/26/19 1853 .  thiamine tablet 100 mg, 100 mg, Oral, Daily, 100 mg at 10/26/19 1853 **OR** thiamine (B-1) injection 100 mg, 100 mg, Intravenous, Daily, Hongalgi, Anand D, MD, 100 mg at 10/27/19 0943    ALLERGIES   Cetirizine, Diazepam, and Baclofen    REVIEW  OF SYSTEMS    10 point ROS done and is negative except    PHYSICAL EXAMINATION   Vital Signs: Temp:  [97.3 F (36.3 C)-97.9 F (36.6 C)] 97.9 F (36.6 C) (07/06 0800) Pulse Rate:  [93-113] 110 (07/06 1200) Resp:  [12-28] 15 (07/06 1200) BP: (102-156)/(57-83) 119/83 (07/06 1200) SpO2:  [  94 %-100 %] 97 % (07/06 1200) Weight:  [57.4 kg] 57.4 kg (07/06 0230)  GENERAL:NAD mild confusion HEAD: Normocephalic, atraumatic.  EYES: Pupils equal, round, reactive to light.  No scleral icterus.  MOUTH: Moist mucosal membrane. NECK: Supple. No thyromegaly. No nodules. No JVD.  PULMONARY: CTAB CARDIOVASCULAR: S1 and S2. Regular rate and rhythm. No murmurs, rubs, or gallops.  GASTROINTESTINAL: Soft, nontender, non-distended. No masses. Positive bowel sounds. No hepatosplenomegaly.  MUSCULOSKELETAL: No swelling, clubbing, or edema.  NEUROLOGIC: Mild distress due to acute illness SKIN:intact,warm,dry   PERTINENT DATA     Infusions: . ampicillin-sulbactam (UNASYN) IV Stopped (10/27/19 0816)  . fluconazole (DIFLUCAN) IV Stopped (10/26/19 2155)  . potassium chloride 10 mEq (10/27/19 1209)  . sodium bicarbonate 150 mEq in dextrose 5% 1000 mL 100 mL/hr at 10/27/19 1112  . sodium chloride (hypertonic) 25 mL/hr at 10/27/19 1112   Scheduled Medications: . Chlorhexidine Gluconate Cloth  6 each Topical Q0600  . [START ON 10/28/2019] famotidine  20 mg Oral Daily  . fluticasone  2 spray Each Nare BID  . folic acid  1 mg Oral Daily  . heparin  5,000 Units Subcutaneous Q8H  . mometasone-formoterol  2 puff Inhalation BID  . multivitamin with minerals  1 tablet Oral Daily  . nicotine  21 mg Transdermal Daily  . pantoprazole  40 mg Oral Daily  . Plecanatide  3 mg Oral Q0600  . potassium chloride  40 mEq Oral Once  . sodium chloride flush  10-40 mL Intracatheter Q12H  . sodium chloride flush  3 mL Intravenous Q12H  . tamsulosin  0.4 mg Oral Daily  . thiamine  100 mg Oral Daily   Or  . thiamine   100 mg Intravenous Daily   PRN Medications: acetaminophen **OR** acetaminophen, albuterol, LORazepam **OR** LORazepam, ondansetron **OR** ondansetron (ZOFRAN) IV, sodium chloride flush Hemodynamic parameters:   Intake/Output: 07/05 0701 - 07/06 0700 In: 1590.6 [I.V.:728.4; IV Piggyback:862.2] Out: 600 [Urine:600]  Ventilator  Settings:      LAB RESULTS:  Basic Metabolic Panel: Recent Labs  Lab 10/26/19 1150 10/26/19 1303 10/26/19 2000 10/26/19 2000 10/26/19 2354 10/26/19 2354 10/27/19 0241 10/27/19 0241 10/27/19 0545 10/27/19 0957  NA 118*   < > 118*  --  119*  --  118*  --  123* 125*  K 3.8   < > 3.6   < > 3.4*   < > 3.1*   < > 2.8* 2.4*  CL 86*   < > 88*  --  87*  --  87*  --  88* 89*  CO2 7*   < > 7*  --  9*  --  9*  --  10* 12*  GLUCOSE 86   < > 76  --  85  --  82  --  93 110*  BUN 137*   < > 134*  --  127*  --  133*  --  138* 134*  CREATININE 4.88*   < > 4.95*  --  4.94*  --  4.78*  --  4.71* 4.58*  CALCIUM 8.1*   < > 7.3*  --  7.2*  --  7.1*  --  6.9* 6.6*  MG 3.0*  --   --   --   --   --   --   --  2.3  --   PHOS  --   --   --   --   --   --   --   --  7.2*  --    < > =  values in this interval not displayed.   Liver Function Tests: Recent Labs  Lab 10/26/19 1150 10/27/19 0545  AST 39 37  ALT 31 24  ALKPHOS 177* 144*  BILITOT 1.6* 1.8*  PROT 7.4 5.9*  ALBUMIN 2.9* 2.2*   Recent Labs  Lab 10/26/19 1150  LIPASE 24   Recent Labs  Lab 10/26/19 1150  AMMONIA 16   CBC: Recent Labs  Lab 10/26/19 1150 10/27/19 0545  WBC 21.6* 17.6*  NEUTROABS 17.9*  --   HGB 12.3 10.1*  HCT 33.2* 27.3*  MCV 85.8 84.8  PLT 267 228   Cardiac Enzymes: No results for input(s): CKTOTAL, CKMB, CKMBINDEX, TROPONINI in the last 168 hours. BNP: Invalid input(s): POCBNP CBG: No results for input(s): GLUCAP in the last 168 hours.     IMAGING RESULTS:  Imaging: DG Chest 2 View  Result Date: 10/26/2019 CLINICAL DATA:  Shortness of breath, slurred speech and  dizziness. EXAM: CHEST - 2 VIEW COMPARISON:  None FINDINGS: Trachea midline. Cardiomediastinal contours and hilar structures are normal. Mild increased interstitial change in the lingula and likely in RIGHT middle lobe. No lobar level consolidative change.  No sign of pleural effusion. On limited assessment skeletal structures are unremarkable. IMPRESSION: Mild increased interstitial prominence without lobar consolidation predominantly in lingula and RIGHT middle lobe lower lobe. Correlate with any history of chronic infection. Findings could also be due to mild and or developing pneumonitis would also correlate for any risk factors for aspiration. Electronically Signed   By: Zetta Bills M.D.   On: 10/26/2019 12:54   CT Head Wo Contrast  Result Date: 10/26/2019 CLINICAL DATA:  Mental status change and dysarthria. EXAM: CT HEAD WITHOUT CONTRAST TECHNIQUE: Contiguous axial images were obtained from the base of the skull through the vertex without intravenous contrast. COMPARISON:  None. FINDINGS: Brain: No evidence of acute infarction, hemorrhage, hydrocephalus, extra-axial collection or mass lesion/mass effect. Vascular: No hyperdense vessel or unexpected calcification. Skull: Normal. Negative for fracture or focal lesion. Sinuses/Orbits: No acute finding. Other: None. IMPRESSION: No focal acute intracranial abnormality identified. Electronically Signed   By: Abelardo Diesel M.D.   On: 10/26/2019 13:06   US RENAL  Result Date: 10/26/2019 CLINICAL DATA:  Acute renal failure EXAM: RENAL / URINARY TRACT ULTRASOUND COMPLETE COMPARISON:  None. FINDINGS: Right Kidney: Renal measurements: 12.0 x 5.3 x 6.9 cm = volume: 227 mL . Echogenicity is increased. No mass or hydronephrosis visualized. Left Kidney: Renal measurements: 10.5 x 5.6 x 4.7 cm = volume: 147 mL. Echogenicity is increased. No mass or hydronephrosis visualized. Bladder: Debris is seen within the bladder. Other: None. IMPRESSION: Increased renal cortical  echogenicity may reflect chronic renal disease. No hydronephrosis. Electronically Signed   By: Zerita Boers M.D.   On: 10/26/2019 17:23   @PROBHOSP @ CT Head Wo Contrast  Result Date: 10/26/2019 CLINICAL DATA:  Mental status change and dysarthria. EXAM: CT HEAD WITHOUT CONTRAST TECHNIQUE: Contiguous axial images were obtained from the base of the skull through the vertex without intravenous contrast. COMPARISON:  None. FINDINGS: Brain: No evidence of acute infarction, hemorrhage, hydrocephalus, extra-axial collection or mass lesion/mass effect. Vascular: No hyperdense vessel or unexpected calcification. Skull: Normal. Negative for fracture or focal lesion. Sinuses/Orbits: No acute finding. Other: None. IMPRESSION: No focal acute intracranial abnormality identified. Electronically Signed   By: Abelardo Diesel M.D.   On: 10/26/2019 13:06   US RENAL  Result Date: 10/26/2019 CLINICAL DATA:  Acute renal failure EXAM: RENAL / URINARY TRACT ULTRASOUND COMPLETE COMPARISON:  None. FINDINGS: Right Kidney: Renal measurements: 12.0 x 5.3 x 6.9 cm = volume: 227 mL . Echogenicity is increased. No mass or hydronephrosis visualized. Left Kidney: Renal measurements: 10.5 x 5.6 x 4.7 cm = volume: 147 mL. Echogenicity is increased. No mass or hydronephrosis visualized. Bladder: Debris is seen within the bladder. Other: None. IMPRESSION: Increased renal cortical echogenicity may reflect chronic renal disease. No hydronephrosis. Electronically Signed   By: Zerita Boers M.D.   On: 10/26/2019 17:23       ASSESSMENT AND PLAN    -Multidisciplinary rounds held today   Altered mental status with confusion  - due to hyponatremia with toxic metabolic encephalopathy and intercurrent UTI with interstitial nephritis -slow correction of Na per nephro  COPD with centrilobular emphysema   -continue home Advair   - no signs of exacerbation during examination today   Aspiration pneumonia   - hx of EtOH   -Unasyn    -monitor  signs of whithdrawal   - CIWA protocol   - thiamine/folate repletion   Oral thrush   - HCV/HIV eval    - patient also reports hx of vaginal yeast infection    - Diflucan 100 IV daily x5d    Severe protein calorie malnutrition   -low albumin   - bitemporal and peripheral muscle wasting.    ID -continue IV abx as prescibed -follow up cultures  GI/Nutrition GI PROPHYLAXIS as indicated DIET-->TF's as tolerated Constipation protocol as indicated  ENDO - ICU hypoglycemic\Hyperglycemia protocol -check FSBS per protocol   ELECTROLYTES -follow labs as needed -replace as needed -pharmacy consultation   DVT/GI PRX ordered -SCDs  TRANSFUSIONS AS NEEDED MONITOR FSBS ASSESS the need for LABS as needed   Critical care provider statement:    Critical care time (minutes):  34   Critical care time was exclusive of:  Separately billable procedures and treating other patients   Critical care was necessary to treat or prevent imminent or life-threatening deterioration of the following conditions:  Altered mental status, confusion, AKI, anorexia, severe protein cal malnutrition   Critical care was time spent personally by me on the following activities:  Development of treatment plan with patient or surrogate, discussions with consultants, evaluation of patient's response to treatment, examination of patient, obtaining history from patient or surrogate, ordering and performing treatments and interventions, ordering and review of laboratory studies and re-evaluation of patient's condition.  I assumed direction of critical care for this patient from another provider in my specialty: no    This document was prepared using Dragon voice recognition software and may include unintentional dictation errors.    Ottie Glazier, M.D.  Division of [place]

## 2019-10-27 NOTE — Consult Note (Signed)
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 Abuse, anemia, anorexia, asthma, stage III CKD, laxative abuse, recurrent UTI/interstitial cystitis, Raynaud's disease, ongoing tobacco abuse admitted for hyponatremia.  Goal of Therapy:  Electrolytes WNL  Plan:   Continue 3% hypertonic saline per nephrology at 25 mL/hr  Sodium checks every 4 hours  Most recent sodium level 125 mmol/L (0957)  Goal of correction:  131-133 by tomorrow morning (per Dr Candiss Norse)  < 6 mmol/L increase per 4 hour interval (per protocol)  She remains on a bicarb drip at 100 mL/hr per Dr Candiss Norse  10 mEq IV KCl x 4 per Dr Truitt Merle ,PharmD Clinical Pharmacist 10/27/2019 12:53 PM

## 2019-10-27 NOTE — Progress Notes (Signed)
Patient trying to get out of bed, sayng that she needed to get up to the bathroom. Patient had purewick on. RN explained several times what it was designed for. Patient extremely forgetful and kept trying to get up no matter how many times RN explained to patient about the [place]. Bed alarm on. RN got BSC as the patient was still insisting on getting up and was not understanding of accepting the education RN was giving to her.RN assisted patient to [facility]. When getting back to bed with RN holding onto the patient. Patient was trying to walk away from the bed. Patient wobbly and fell to the floor. Patient was saying she needed to go to the bathroom. RN explained to patient she already went on the [facility].RN called for help. Another RN came to help assist RN to get patient back in bed. VSS. No new bruising noted.

## 2019-10-27 NOTE — Progress Notes (Signed)
[address] [place], [place] [address] Phone [phone]. Fax [phone]  Date: 10/27/2019                  Patient Name:  Katherine Perez  MRN: [id number]  DOB: 1970-11-24  Age / Sex: 50 y.o., female         PCP: Patient, No Pcp Per                 Service Requesting Consult: IM/ Bonnielee Haff, MD                 Reason for Consult: ARF            History of Present Illness: Patient is a 49 y.o. female with medical problems of multiple medical problems, who was admitted to [facility] on 10/26/2019 for evaluation of confusion and dysarthria.   Presented for dysarthryia and word finding difficulty. Fingers noted to be purple. Also concern of Shortness of breath and confusion. H/o laxative abuse. Ongoing alcohol abuse - 4-5 drinks per husband H/o colonic intertia, chronic constipation, pelvic floor dysfuncion, gastroparesis, malnutrition  + NSAIDs- diclonofenac-misoprostol started 09/25/2019  Patient is very lethargic this morning She has been agitated overnight and try to climb out of bed Currently there is a sitter in the room  Current medications: Current Facility-Administered Medications  Medication Dose Route Frequency Provider Last Rate Last Admin  . acetaminophen (TYLENOL) tablet 650 mg  650 mg Oral Q6H PRN Hongalgi, Lenis Dickinson, MD       Or  . acetaminophen (TYLENOL) suppository 650 mg  650 mg Rectal Q6H PRN Hongalgi, Anand D, MD      . albuterol (PROVENTIL) (2.5 MG/3ML) 0.083% nebulizer solution 2.5 mg  2.5 mg Nebulization Q2H PRN Hongalgi, Anand D, MD      . Ampicillin-Sulbactam (UNASYN) 3 g in sodium chloride 0.9 % 100 mL IVPB  3 g Intravenous Q12H Lu Duffel, RPH 200 mL/hr at 10/27/19 0815 Rate Verify at 10/27/19 0815  . Chlorhexidine Gluconate Cloth 2 % PADS 6 each  6 each Topical Q0600 Sharion Settler, NP   6 each at 10/27/19 0226  . famotidine (PEPCID) tablet 40 mg  40 mg Oral Daily Modena Jansky, MD   40 mg at 10/26/19 1853  . fluconazole  (DIFLUCAN) IVPB 100 mg  100 mg Intravenous Q24H Ottie Glazier, MD   Paused at 10/26/19 2155  . fluticasone (FLONASE) 50 MCG/ACT nasal spray 2 spray  2 spray Each Nare BID Hongalgi, Anand D, MD      . folic acid (FOLVITE) tablet 1 mg  1 mg Oral Daily Modena Jansky, MD   1 mg at 10/26/19 1853  . heparin injection 5,000 Units  5,000 Units Subcutaneous Q8H Modena Jansky, MD   5,000 Units at 10/27/19 0152  . LORazepam (ATIVAN) tablet 1-4 mg  1-4 mg Oral Q1H PRN Modena Jansky, MD   1 mg at 10/26/19 2052   Or  . LORazepam (ATIVAN) injection 1-4 mg  1-4 mg Intravenous Q1H PRN Hongalgi, Anand D, MD      . mometasone-formoterol (DULERA) 100-5 MCG/ACT inhaler 2 puff  2 puff Inhalation BID Hongalgi, Anand D, MD      . multivitamin with minerals tablet 1 tablet  1 tablet Oral Daily Modena Jansky, MD   1 tablet at 10/26/19 1852  . nicotine (NICODERM CQ - dosed in mg/24 hours) patch 21 mg  21 mg Transdermal Daily Modena Jansky, MD      .  ondansetron (ZOFRAN) tablet 4 mg  4 mg Oral Q6H PRN Hongalgi, Lenis Dickinson, MD       Or  . ondansetron (ZOFRAN) injection 4 mg  4 mg Intravenous Q6H PRN Hongalgi, Anand D, MD      . pantoprazole (PROTONIX) EC tablet 40 mg  40 mg Oral Daily Modena Jansky, MD   40 mg at 10/26/19 1852  . Plecanatide TABS 3 mg  3 mg Oral Q0600 Hongalgi, Anand D, MD      . potassium chloride 10 mEq in 100 mL IVPB  10 mEq Intravenous Q1 Hr x 4 Emira Eubanks, MD      . potassium chloride SA (KLOR-CON) CR tablet 40 mEq  40 mEq Oral Once Sharion Settler, NP      . sodium bicarbonate 150 mEq in dextrose 5% 1000 mL infusion  150 mEq Intravenous Continuous Awilda Bill, NP 100 mL/hr at 10/27/19 0815 Rate Verify at 10/27/19 0815  . sodium chloride (hypertonic) 3 % solution   Intravenous Continuous Hart Robinsons A, RPH 25 mL/hr at 10/27/19 0815 Rate Verify at 10/27/19 0815  . sodium chloride flush (NS) 0.9 % injection 10-40 mL  10-40 mL Intracatheter Q12H Hongalgi, Lenis Dickinson, MD   10 mL  at 10/27/19 0140  . sodium chloride flush (NS) 0.9 % injection 10-40 mL  10-40 mL Intracatheter PRN Hongalgi, Anand D, MD      . sodium chloride flush (NS) 0.9 % injection 3 mL  3 mL Intravenous Q12H Hongalgi, Anand D, MD      . tamsulosin (FLOMAX) capsule 0.4 mg  0.4 mg Oral Daily Modena Jansky, MD   0.4 mg at 10/26/19 1853  . theophylline (UNIPHYL) 400 MG 24 hr tablet 400 mg  400 mg Oral BID Modena Jansky, MD   400 mg at 10/26/19 2245  . thiamine tablet 100 mg  100 mg Oral Daily Vernell Leep D, MD   100 mg at 10/26/19 1853   Or  . thiamine (B-1) injection 100 mg  100 mg Intravenous Daily Hongalgi, Lenis Dickinson, MD          Vital Signs: Blood pressure (!) 102/57, pulse (!) 105, temperature 97.9 F (36.6 C), temperature source Oral, resp. rate (!) 26, height 5\' 9"  (1.753 m), weight 57.4 kg, SpO2 100 %.   Intake/Output Summary (Last 24 hours) at 10/27/2019 0922 Last data filed at 10/27/2019 0815 Gross per 24 hour  Intake 2174.64 ml  Output 600 ml  Net 1574.64 ml    Weight trends: Filed Weights   10/26/19 1128 10/27/19 0230  Weight: 63.5 kg 57.4 kg    Physical Exam: General:  NAD  HEENT Dry oral mucus membranes  Neck:  supple, no JVD  Lungs: Coarse breath sounds b/l, room air  Heart::  regular  Abdomen: Soft, nontender  Extremities:  no edema  Neurologic:  Sleeping.  Did not wake up to answer questions.  Skin: No acute rashes,    Lab results: Basic Metabolic Panel: Recent Labs  Lab 10/26/19 1150 10/26/19 1303 10/26/19 2354 10/27/19 0241 10/27/19 0545  NA 118*   < > 119* 118* 123*  K 3.8   < > 3.4* 3.1* 2.8*  CL 86*   < > 87* 87* 88*  CO2 7*   < > 9* 9* 10*  GLUCOSE 86   < > 85 82 93  BUN 137*   < > 127* 269* 138*  CREATININE 4.88*   < > 4.94* 4.78* 4.71*  CALCIUM 8.1*   < > 7.2* 7.1* 6.9*  MG 3.0*  --   --   --  2.3  PHOS  --   --   --   --  7.2*   < > = values in this interval not displayed.    Liver Function Tests: Recent Labs  Lab 10/27/19 0545   AST 37  ALT 24  ALKPHOS 144*  BILITOT 1.8*  PROT 5.9*  ALBUMIN 2.2*   Recent Labs  Lab 10/26/19 1150  LIPASE 24   Recent Labs  Lab 10/26/19 1150  AMMONIA 16    CBC: Recent Labs  Lab 10/26/19 1150 10/27/19 0545  WBC 21.6* 17.6*  NEUTROABS 17.9*  --   HGB 12.3 10.1*  HCT 33.2* 27.3*  MCV 85.8 84.8  PLT 267 228    Cardiac Enzymes: No results for input(s): CKTOTAL, TROPONINI in the last 168 hours.  BNP: Invalid input(s): POCBNP  CBG: No results for input(s): GLUCAP in the last 168 hours.  Microbiology: Recent Results (from the past 720 hour(s))  Group A Strep by PCR ([facility] Only)     Status: None   Collection Time: 10/26/19  1:20 PM   Specimen: Throat; Sterile Swab  Result Value Ref Range Status   Group A Strep by PCR NOT DETECTED NOT DETECTED Final    Comment: Performed at [facility], [address] [place], [place] [address]  SARS Coronavirus 2 by RT PCR (hospital order, performed in [facility] hospital lab) Nasopharyngeal Nasopharyngeal Swab     Status: None   Collection Time: 10/26/19  1:40 PM   Specimen: Nasopharyngeal Swab  Result Value Ref Range Status   SARS Coronavirus 2 NEGATIVE NEGATIVE Final    Comment: (NOTE) SARS-CoV-2 target nucleic acids are NOT DETECTED.  The SARS-CoV-2 RNA is generally detectable in upper and lower respiratory specimens during the acute phase of infection. The lowest concentration of SARS-CoV-2 viral copies this assay can detect is 250 copies / mL. A negative result does not preclude SARS-CoV-2 infection and should not be used as the sole basis for treatment or other patient management decisions.  A negative result may occur with improper specimen collection / handling, submission of specimen other than nasopharyngeal swab, presence of viral mutation(s) within the areas targeted by this assay, and inadequate number of viral copies (<250 copies / mL). A negative result must be combined with  clinical observations, patient history, and epidemiological information.  Fact Sheet for Patients:   StrictlyIdeas.no  Fact Sheet for Healthcare Providers: BankingDealers.co.za  This test is not yet approved or  cleared by the [place] FDA and has been authorized for detection and/or diagnosis of SARS-CoV-2 by FDA under an Emergency Use Authorization (EUA).  This EUA will remain in effect (meaning this test can be used) for the duration of the COVID-19 declaration under Section 564(b)(1) of the Act, 21 U.S.C. section 360bbb-3(b)(1), unless the authorization is terminated or revoked sooner.  Performed at [facility], [address] [place], [place] [address]   Culture, blood (routine x 2)     Status: None (Preliminary result)   Collection Time: 10/26/19  1:41 PM   Specimen: BLOOD  Result Value Ref Range Status   Specimen Description BLOOD RIGHT ANTECUBITAL  Final   Special Requests   Final    BOTTLES DRAWN AEROBIC AND ANAEROBIC Blood Culture adequate volume   Culture   Final    NO GROWTH < 24 HOURS Performed at [facility], [address] [place], [place] [address]    Report Status PENDING  Incomplete  Culture, blood (routine x 2)     Status: None (Preliminary result)   Collection Time: 10/26/19  1:41 PM   Specimen: BLOOD  Result Value Ref Range Status   Specimen Description BLOOD BLOOD LEFT FOREARM  Final   Special Requests   Final    BOTTLES DRAWN AEROBIC AND ANAEROBIC Blood Culture adequate volume   Culture   Final    NO GROWTH < 24 HOURS Performed at [facility], [address] [place], [place] [address]    Report Status PENDING  Incomplete  MRSA PCR Screening     Status: None   Collection Time: 10/27/19  2:34 AM   Specimen: Nasopharyngeal  Result Value Ref Range Status   MRSA by PCR NEGATIVE NEGATIVE Final    Comment:        The GeneXpert MRSA Assay (FDA approved for NASAL  specimens only), is one component of a comprehensive MRSA colonization surveillance program. It is not intended to diagnose MRSA infection nor to guide or monitor treatment for MRSA infections. Performed at [facility], [address] [place],  [address]      Coagulation Studies: No results for input(s): LABPROT, INR in the last 72 hours.  Urinalysis: Recent Labs    10/26/19 1231  COLORURINE BIOCHEMICALS MAY BE AFFECTED BY COLOR*  LABSPEC 1.008  PHURINE 5.0  GLUCOSEU NEGATIVE  HGBUR MODERATE*  BILIRUBINUR NEGATIVE  KETONESUR 5*  PROTEINUR 30*  NITRITE POSITIVE*  LEUKOCYTESUR LARGE*        Imaging: DG Chest 2 View  Result Date: 10/26/2019 CLINICAL DATA:  Shortness of breath, slurred speech and dizziness. EXAM: CHEST - 2 VIEW COMPARISON:  None FINDINGS: Trachea midline. Cardiomediastinal contours and hilar structures are normal. Mild increased interstitial change in the lingula and likely in RIGHT middle lobe. No lobar level consolidative change.  No sign of pleural effusion. On limited assessment skeletal structures are unremarkable. IMPRESSION: Mild increased interstitial prominence without lobar consolidation predominantly in lingula and RIGHT middle lobe lower lobe. Correlate with any history of chronic infection. Findings could also be due to mild and or developing pneumonitis would also correlate for any risk factors for aspiration. Electronically Signed   By: Zetta Bills M.D.   On: 10/26/2019 12:54   CT Head Wo Contrast  Result Date: 10/26/2019 CLINICAL DATA:  Mental status change and dysarthria. EXAM: CT HEAD WITHOUT CONTRAST TECHNIQUE: Contiguous axial images were obtained from the base of the skull through the vertex without intravenous contrast. COMPARISON:  None. FINDINGS: Brain: No evidence of acute infarction, hemorrhage, hydrocephalus, extra-axial collection or mass lesion/mass effect. Vascular: No hyperdense vessel or unexpected  calcification. Skull: Normal. Negative for fracture or focal lesion. Sinuses/Orbits: No acute finding. Other: None. IMPRESSION: No focal acute intracranial abnormality identified. Electronically Signed   By: Abelardo Diesel M.D.   On: 10/26/2019 13:06   US RENAL  Result Date: 10/26/2019 CLINICAL DATA:  Acute renal failure EXAM: RENAL / URINARY TRACT ULTRASOUND COMPLETE COMPARISON:  None. FINDINGS: Right Kidney: Renal measurements: 12.0 x 5.3 x 6.9 cm = volume: 227 mL . Echogenicity is increased. No mass or hydronephrosis visualized. Left Kidney: Renal measurements: 10.5 x 5.6 x 4.7 cm = volume: 147 mL. Echogenicity is increased. No mass or hydronephrosis visualized. Bladder: Debris is seen within the bladder. Other: None. IMPRESSION: Increased renal cortical echogenicity may reflect chronic renal disease. No hydronephrosis. Electronically Signed   By: Dorothea Ogle  Litton M.D.   On: 10/26/2019 17:23     Assessment & Plan: Pt is a 49 y.o. 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, pelvic floor dysfunction, acid reflux, gastroparesis, iron deficiency anemia, malnutrition, alcohol abuse, tobacco abuse, anemia, asthma, chronic kidney disease, laxative abuse, recurrent UTIs, interstitial cystitis, Raynaud's disease, was admitted on 10/26/2019 with Dehydration [E86.0] Hyponatremia [E87.1] ARF (acute renal failure) (HCC) [N17.9] AKI (acute kidney injury) ([place]) [N17.9] Urinary tract infection with hematuria, site unspecified [N39.0, R31.9]   # ARF # Hyponatremia # Substance abuse - alcohol # heavy smoker 1.5 ppd # severe acidosis # pyuria (chronic interstitial cystitis)  ARF is likely multifactorial from volume depletion, possibly underlying infection and NSAIDs No recent iv contrast exposure -Baseline Cr 1.40/ GFR 45 from 09/23/2019 -US renal: Debris seen within bladder.  Increased renal cortical echogenicity.  No hydronephrosis - gentle volume repletion -Hold NSAIDs, avoid  hypotension  Hyponatremia is also multifactorial with contribution from volume depletion, alcohol abuse. Other ddx includes SIADH -Patient has received IV normal saline boluses but sodium has not improved - will correct underlying dehydration and treat with iv bicarb to correct acidosis - monitor Na frequently -Continue low-dose 3% saline at 25 cc/h.  Monitor sodium frequently. -Goal of correction is 131-133 by tomorrow morning  Alcohol withdrawal precautions as per ICU team Possible aspiration pneumonia treatment as per ICU Send urine for culture - patient has h/o interstitial cystitis- treat only if culture positive     LOS: 1 Galo Sayed 7/6/20219:22 AM    Note: This note was prepared with Dragon dictation. Any transcription errors are unintentional

## 2019-10-27 NOTE — ED Notes (Signed)
Pt had removed IV due to crawling to end of bed and sitting on edn of bed. Site cleaned

## 2019-10-27 NOTE — ED Notes (Signed)
Respiratory placed VBG that was resulted for pt at 1400 but never came into computer.   Results are as followed:  PH 7.09 PCO2 21 PO2 41 tHb 11.0 O2Hb 56.1 COHb 1.6 MetHb 2.1 sO2 58.3 BE(B) -21.7 SO2(C) 53.8 HCO3(c) 6.4 Hct(c) 33

## 2019-10-27 NOTE — Progress Notes (Signed)
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  Abuse, anemia, anorexia, asthma, stage III CKD, laxative abuse, recurrent UTI/interstitial cystitis, Raynaud's disease, ongoing tobacco abuse presented to [facility] ED with poor appetite, decreased energy lethargy and confusion.  She was found to have severe hyponatremia, urine concerning for infection chest x-ray also concerning for infection.  She was hospitalized for further management.    Reason for Visit: Hyponatremia  Consultants: Nephrology.  Critical care medicine.  Procedures: None yet  Antibiotics: Anti-infectives (From admission, onward)   Start     Dose/Rate Route Frequency Ordered Stop   10/26/19 2200  ceFEPIme (MAXIPIME) 2 g in sodium chloride 0.9 % 100 mL IVPB  Status:  Discontinued        2 g 200 mL/hr over 30 Minutes Intravenous Every 24 hours 10/26/19 1708 10/26/19 1751   10/26/19 2000  Ampicillin-Sulbactam (UNASYN) 3 g in sodium chloride 0.9 % 100 mL IVPB     Discontinue     3 g 200 mL/hr over 30 Minutes Intravenous Every 12 hours 10/26/19 1817     10/26/19 1900  fluconazole (DIFLUCAN) IVPB 100 mg     Discontinue     100 mg 50 mL/hr over 60 Minutes Intravenous Every 24 hours 10/26/19 1757     10/26/19 1315  ceFEPIme (MAXIPIME) 1 g in sodium chloride 0.9 % 100 mL IVPB        1 g 200 mL/hr over 30 Minutes Intravenous  Once 10/26/19 1308 10/26/19 1441      Subjective/Interval History: Patient noted to be confused this morning.   Overnight events noted.  She was noted to be retaining urine.  Discussed with nursing staff.  Patient not very communicative.  She does open her eyes.  ROS: Unable to do due to her encephalopathy.    Assessment/Plan:  Hyponatremia Appears to be acute.  Her sodium level was 134 on 6/2.  Presented with a sodium of 118.  Likely multifactorial including dehydration, alcohol abuse.  Patient was given IV fluids with no improvement in sodium level.  Nephrology was consulted.  Urine osmolality noted to be 233.  Serum osmolality 295.  Patient was subsequently started on 3% saline last night with improvement in sodium level this morning.  Further management per nephrology.  Acute metabolic encephalopathy Most likely due to combination of hyponatremia, infection, acidosis.  No focal neurological deficits noted.  Continue to monitor closely.  Reorient.  CT head does not show any acute findings.  Continue thiamine.  Acute on chronic kidney disease stage III Creatinine was 1.4 on June 2.  Presented with a creatinine of 4.88.  Most likely due to NSAID use and dehydration.  Continue with IV fluids.  She has been retaining urine.  If she has more urinary retention may benefit from Foley catheter placement.  Nephrology is following.  Electrolytes to be corrected by ICU pharmacy.  She remains on bicarbonate infusion as well.  Acidosis appears to be improving.  Bicarbonate was noted to be 10 this morning.  Continue to monitor labs and monitor urine output.  Renal ultrasound does not show any hydronephrosis.  Elevated  anion gap metabolic acidosis Most likely due to acute kidney injury and hypovolemia.  Alcohol likely contributing as well.  She is not a diabetic.  Ketones noted in the urine however this could be due to starvation.  Suspected aspiration pneumonia Continue cefepime per pharmacy.  Possible UTI complicating chronic interstitial cystitis Follow-up urine culture reports.  Continue  cefepime.  Leukocytosis Nature of infection as well as reactive.  Continue to monitor.  Chronic constipation and anorexia Bowel regimen to continue.  History of asthma Patient noted to be on inhalers.  Also noted to be on theophylline.  Will hold for now.  Tobacco abuse Nicotine patch  History of alcohol abuse Continue CIWA protocol.  Prolonged QTC Avoid QT prolonging medications.  Check EKGs periodically.  Correct electrolytes.  Pressure injury Pressure Injury 10/27/19 Sacrum Stage 1 -  Intact skin with non-blanchable redness of a localized area usually over a bony prominence. (Active)  10/27/19 0820  Location: Sacrum  Location Orientation:   Staging: Stage 1 -  Intact skin with non-blanchable redness of a localized area usually over a bony prominence.  Wound Description (Comments):   Present on Admission: Yes    DVT Prophylaxis: Subcutaneous heparin Code Status: Full code Family Communication: No family at bedside. Disposition Plan:  Status is: Inpatient  Remains inpatient appropriate because:Persistent severe electrolyte disturbances and Altered mental status   Dispo: The patient is from: Home              Anticipated d/c is to: To be determined              Anticipated d/c date is: > 3 days              Patient currently is not medically stable to d/c.      Medications:  Scheduled:  Chlorhexidine Gluconate Cloth  6 each Topical Q0600   [START ON 10/28/2019] famotidine  20 mg Oral Daily   fluticasone  2 spray Each Nare BID   folic acid  1 mg Oral Daily   heparin  5,000 Units Subcutaneous Q8H   mometasone-formoterol  2 puff Inhalation BID   multivitamin with minerals  1 tablet Oral Daily   nicotine  21 mg Transdermal Daily   pantoprazole  40 mg Oral Daily   Plecanatide  3 mg Oral Q0600   potassium chloride  40 mEq Oral Once   sodium chloride flush  10-40 mL Intracatheter Q12H   sodium chloride flush  3 mL Intravenous Q12H   tamsulosin  0.4  mg Oral Daily   theophylline  400 mg Oral BID   thiamine  100 mg Oral Daily   Or   thiamine  100 mg Intravenous Daily   Continuous:  ampicillin-sulbactam (UNASYN) IV 200 mL/hr at 10/27/19 0815   fluconazole (DIFLUCAN) IV Stopped (10/26/19 2155)   potassium chloride 10 mEq (10/27/19 0948)   sodium bicarbonate 150 mEq in dextrose 5% 1000 mL 100 mL/hr at 10/27/19 0815   sodium chloride (hypertonic) 25 mL/hr at 10/27/19 0815   NWG:[id number] **OR** acetaminophen, albuterol, LORazepam **OR** LORazepam, ondansetron **OR** ondansetron (ZOFRAN) IV, sodium chloride flush   Objective:  Vital Signs  Vitals:   10/27/19 0700 10/27/19 0800 10/27/19 0900 10/27/19 1000  BP: 108/69 (!) 102/57 104/66   Pulse: (!) 109 (!) 105 (!) 102 (!) 108  Resp: (!) 24 (!) 26 15 (!) 22  Temp:  97.9 F (36.6 C)    TempSrc:  Oral    SpO2: 97% 100% 100% 100%  Weight:      Height:        Intake/Output Summary (Last 24 hours) at 10/27/2019 1019 Last data filed at 10/27/2019 0815 Gross per 24 hour  Intake 2174.64 ml  Output 600 ml  Net 1574.64 ml   Filed Weights   10/26/19 1128 10/27/19 0230  Weight: 63.5 kg 57.4 kg    General appearance: Somnolent easily arousable.  Encephalopathic.  In no distress. Resp: Mildly tachypneic.  Coarse breath sounds with crackles at the bases.  No wheezing or rhonchi.   Cardio: S1-S2 is normal regular.  No S3-S4.  No rubs murmurs or bruit GI: Abdomen is soft.  Nontender nondistended.  Bowel sounds are present normal.  No masses organomegaly Extremities: No edema.  Moving all extremities. Neurologic: Noted to be delirious.  Does not follow commands.  Moving all her extremities.  No obvious focal deficits appreciated.   Lab Results:  Data Reviewed: I have personally reviewed following labs and imaging studies  CBC: Recent Labs  Lab 10/26/19 1150 10/27/19 0545  WBC 21.6* 17.6*  NEUTROABS 17.9*  --   HGB 12.3 10.1*  HCT 33.2* 27.3*  MCV 85.8 84.8  PLT 267  253    Basic Metabolic Panel: Recent Labs  Lab 10/26/19 1150 10/26/19 1303 10/26/19 1545 10/26/19 2000 10/26/19 2354 10/27/19 0241 10/27/19 0545  NA 118*   < > 118* 118* 119* 118* 123*  K 3.8   < > 3.7 3.6 3.4* 3.1* 2.8*  CL 86*   < > 87* 88* 87* 87* 88*  CO2 7*   < > 7* 7* 9* 9* 10*  GLUCOSE 86   < > 78 76 85 82 93  BUN 137*   < > 134* 134* 127* 269* 138*  CREATININE 4.88*   < > 4.95* 4.95* 4.94* 4.78* 4.71*  CALCIUM 8.1*   < > 7.6* 7.3* 7.2* 7.1* 6.9*  MG 3.0*  --   --   --   --   --  2.3  PHOS  --   --   --   --   --   --  7.2*   < > = values in this interval not displayed.    GFR: Estimated Creatinine Clearance: 13.2 mL/min (A) (by C-G formula based on SCr of 4.71 mg/dL (H)).  Liver Function Tests: Recent Labs  Lab 10/26/19 1150 10/27/19 0545  AST 39 37  ALT 31 24  ALKPHOS 177* 144*  BILITOT 1.6* 1.8*  PROT 7.4 5.9*  ALBUMIN 2.9* 2.2*    Recent Labs  Lab 10/26/19 1150  LIPASE 24   Recent Labs  Lab 10/26/19 1150  AMMONIA 16     Recent Results (from the past 240 hour(s))  Group A Strep by PCR ([place] Only)     Status: None   Collection Time: 10/26/19  1:20 PM   Specimen: Throat; Sterile Swab  Result Value Ref Range Status   Group A Strep by PCR NOT DETECTED NOT DETECTED Final    Comment: Performed at [facility], [address] [place], [place] [address]  SARS Coronavirus 2 by RT PCR (hospital order, performed in [facility] hospital lab) Nasopharyngeal Nasopharyngeal Swab     Status: None   Collection Time: 10/26/19  1:40 PM   Specimen: Nasopharyngeal Swab  Result Value Ref Range Status   SARS Coronavirus 2 NEGATIVE NEGATIVE Final    Comment: (NOTE) SARS-CoV-2 target nucleic acids are NOT DETECTED.  The SARS-CoV-2 RNA is generally detectable in upper  and lower respiratory specimens during the acute phase of infection. The lowest concentration of SARS-CoV-2 viral copies this assay can detect is 250 copies / mL. A negative result does  not preclude SARS-CoV-2 infection and should not be used as the sole basis for treatment or other patient management decisions.  A negative result may occur with improper specimen collection / handling, submission of specimen other than nasopharyngeal swab, presence of viral mutation(s) within the areas targeted by this assay, and inadequate number of viral copies (<250 copies / mL). A negative result must be combined with clinical observations, patient history, and epidemiological information.  Fact Sheet for Patients:   StrictlyIdeas.no  Fact Sheet for Healthcare Providers: BankingDealers.co.za  This test is not yet approved or  cleared by the [place] FDA and has been authorized for detection and/or diagnosis of SARS-CoV-2 by FDA under an Emergency Use Authorization (EUA).  This EUA will remain in effect (meaning this test can be used) for the duration of the COVID-19 declaration under Section 564(b)(1) of the Act, 21 U.S.C. section 360bbb-3(b)(1), unless the authorization is terminated or revoked sooner.  Performed at [facility], [address] [place], [place] [address]   Culture, blood (routine x 2)     Status: None (Preliminary result)   Collection Time: 10/26/19  1:41 PM   Specimen: BLOOD  Result Value Ref Range Status   Specimen Description BLOOD RIGHT ANTECUBITAL  Final   Special Requests   Final    BOTTLES DRAWN AEROBIC AND ANAEROBIC Blood Culture adequate volume   Culture   Final    NO GROWTH < 24 HOURS Performed at [facility], [address] [place], [place] [address]    Report Status PENDING  Incomplete  Culture, blood (routine x 2)     Status: None (Preliminary result)   Collection Time: 10/26/19  1:41 PM   Specimen: BLOOD  Result Value Ref Range Status   Specimen Description BLOOD BLOOD LEFT FOREARM  Final   Special Requests   Final    BOTTLES DRAWN AEROBIC AND ANAEROBIC Blood  Culture adequate volume   Culture   Final    NO GROWTH < 24 HOURS Performed at [facility], [address] [place], [place] [address]    Report Status PENDING  Incomplete  MRSA PCR Screening     Status: None   Collection Time: 10/27/19  2:34 AM   Specimen: Nasopharyngeal  Result Value Ref Range Status   MRSA by PCR NEGATIVE NEGATIVE Final    Comment:        The GeneXpert MRSA Assay (FDA approved for NASAL specimens only), is one component of a comprehensive MRSA colonization surveillance program. It is not intended to diagnose MRSA infection nor to guide or monitor treatment for MRSA infections. Performed at [facility], [address], [place], [place] [address]       Radiology Studies: DG Chest 2 View  Result Date: 10/26/2019 CLINICAL DATA:  Shortness of breath, slurred speech and dizziness. EXAM: CHEST - 2 VIEW COMPARISON:  None FINDINGS: Trachea midline. Cardiomediastinal contours and hilar structures are normal. Mild increased interstitial change in the lingula and likely in RIGHT middle lobe. No lobar level consolidative change.  No sign of pleural effusion. On limited assessment skeletal structures are unremarkable. IMPRESSION: Mild increased interstitial prominence without lobar consolidation predominantly in lingula and RIGHT middle lobe lower lobe. Correlate with any history of chronic infection. Findings could also be due to mild and or developing  pneumonitis would also correlate for any risk factors for aspiration. Electronically Signed   By: Zetta Bills M.D.   On: 10/26/2019 12:54   CT Head Wo Contrast  Result Date: 10/26/2019 CLINICAL DATA:  Mental status change and dysarthria. EXAM: CT HEAD WITHOUT CONTRAST TECHNIQUE: Contiguous axial images were obtained from the base of the skull through the vertex without intravenous contrast. COMPARISON:  None. FINDINGS: Brain: No evidence of acute infarction, hemorrhage, hydrocephalus, extra-axial  collection or mass lesion/mass effect. Vascular: No hyperdense vessel or unexpected calcification. Skull: Normal. Negative for fracture or focal lesion. Sinuses/Orbits: No acute finding. Other: None. IMPRESSION: No focal acute intracranial abnormality identified. Electronically Signed   By: Abelardo Diesel M.D.   On: 10/26/2019 13:06   US RENAL  Result Date: 10/26/2019 CLINICAL DATA:  Acute renal failure EXAM: RENAL / URINARY TRACT ULTRASOUND COMPLETE COMPARISON:  None. FINDINGS: Right Kidney: Renal measurements: 12.0 x 5.3 x 6.9 cm = volume: 227 mL . Echogenicity is increased. No mass or hydronephrosis visualized. Left Kidney: Renal measurements: 10.5 x 5.6 x 4.7 cm = volume: 147 mL. Echogenicity is increased. No mass or hydronephrosis visualized. Bladder: Debris is seen within the bladder. Other: None. IMPRESSION: Increased renal cortical echogenicity may reflect chronic renal disease. No hydronephrosis. Electronically Signed   By: Zerita Boers M.D.   On: 10/26/2019 17:23       LOS: 1 day   [facility] Pager on [email or web address]  10/27/2019, 10:19 AM

## 2019-10-27 NOTE — ED Notes (Signed)
IV team at bedside 

## 2019-10-28 ENCOUNTER — Inpatient Hospital Stay: Payer: BC Managed Care – PPO

## 2019-10-28 LAB — BLOOD CULTURE ID PANEL (REFLEXED)

## 2019-10-28 LAB — VOLATILES,BLD-ACETONE,ETHANOL,ISOPROP,METHANOL
Acetone, blood: <0.01 g/dL (ref 0.000–0.010)
Ethanol, blood: <0.01 g/dL (ref 0.000–0.010)
Isopropanol, blood: <0.01 g/dL (ref 0.000–0.010)
Methanol, blood: <0.01 g/dL (ref 0.000–0.010)

## 2019-10-28 LAB — CBC
HCT: 27.8 % — ABNORMAL LOW (ref 36.0–46.0)
Hemoglobin: 10.4 g/dL — ABNORMAL LOW (ref 12.0–15.0)
MCH: 31.1 pg (ref 26.0–34.0)
MCHC: 37.4 g/dL — ABNORMAL HIGH (ref 30.0–36.0)
MCV: 83.2 fL (ref 80.0–100.0)
Platelets: 231 10*3/uL (ref 150–400)
RBC: 3.34 MIL/uL — ABNORMAL LOW (ref 3.87–5.11)
RDW: 12 % (ref 11.5–15.5)
WBC: 19.5 10*3/uL — ABNORMAL HIGH (ref 4.0–10.5)
nRBC: 0 % (ref 0.0–0.2)

## 2019-10-28 LAB — BLOOD GAS, VENOUS
Acid-base deficit: 19 mmol/L — ABNORMAL HIGH (ref 0.0–2.0)
Bicarbonate: 7.3 mmol/L — ABNORMAL LOW (ref 20.0–28.0)
FIO2: 0.21
O2 Saturation: 54.6 %
Patient temperature: 37
pCO2, Ven: 19 mmHg — CL (ref 44.0–60.0)

## 2019-10-28 LAB — BASIC METABOLIC PANEL
Anion gap: 18 — ABNORMAL HIGH (ref 5–15)
BUN: 119 mg/dL — ABNORMAL HIGH (ref 6–20)
CO2: 25 mmol/L (ref 22–32)
Calcium: 6.9 mg/dL — ABNORMAL LOW (ref 8.9–10.3)
Chloride: 92 mmol/L — ABNORMAL LOW (ref 98–111)
Creatinine, Ser: 4.18 mg/dL — ABNORMAL HIGH (ref 0.44–1.00)
GFR calc Af Amer: 14 mL/min — ABNORMAL LOW (ref >60)
GFR calc non Af Amer: 12 mL/min — ABNORMAL LOW (ref >60)
Glucose, Bld: 144 mg/dL — ABNORMAL HIGH (ref 70–99)
Potassium: 2.3 mmol/L — CL (ref 3.5–5.1)
Sodium: 135 mmol/L (ref 135–145)

## 2019-10-28 LAB — THEOPHYLLINE LEVEL
Theophylline Lvl: 22.4 ug/mL — ABNORMAL HIGH (ref 10.0–20.0)
Theophylline Lvl: 17.8 ug/mL (ref 10.0–20.0)

## 2019-10-28 LAB — MAGNESIUM: Magnesium: 2.3 mg/dL (ref 1.7–2.4)

## 2019-10-28 LAB — GLUCOSE, CAPILLARY: Glucose-Capillary: 135 mg/dL — ABNORMAL HIGH (ref 70–99)

## 2019-10-28 LAB — SODIUM: Sodium: 133 mmol/L — ABNORMAL LOW (ref 135–145)

## 2019-10-28 LAB — PHOSPHORUS: Phosphorus: 4.5 mg/dL (ref 2.5–4.6)

## 2019-10-28 MED ORDER — POTASSIUM CHLORIDE 10 MEQ/100ML IV SOLN
10.0000 meq | INTRAVENOUS | Status: AC
  Administered 2019-10-28 (×4): 10 meq via INTRAVENOUS
  Filled 2019-10-28 (×4): qty 100

## 2019-10-28 MED ORDER — METRONIDAZOLE IN NACL 5-0.79 MG/ML-% IV SOLN
500.0000 mg | Freq: Three times a day (TID) | INTRAVENOUS | Status: DC
  Administered 2019-10-28 – 2019-10-29 (×3): 500 mg via INTRAVENOUS
  Filled 2019-10-28 (×4): qty 100

## 2019-10-28 MED ORDER — SODIUM CHLORIDE 0.9 % IV SOLN
2.0000 g | INTRAVENOUS | Status: DC
  Administered 2019-10-28: 2 g via INTRAVENOUS
  Filled 2019-10-28: qty 20
  Filled 2019-10-28: qty 2

## 2019-10-28 MED ORDER — LACTATED RINGERS IV SOLN: INTRAVENOUS | Status: DC

## 2019-10-28 MED ORDER — WHITE PETROLATUM EX OINT
TOPICAL_OINTMENT | CUTANEOUS | Status: DC | PRN
  Administered 2019-10-29 (×2): 1 via TOPICAL
  Filled 2019-10-28 (×4): qty 5

## 2019-10-28 NOTE — Consult Note (Signed)
CRITICAL CARE PROGRESS NOTE    Name: Yazaira Speas MRN: [id number] DOB: 1970/10/06     LOS: 2   SUBJECTIVE FINDINGS & SIGNIFICANT EVENTS    Patient description:  77 female admitted with altered mental status with confusion as well as acrocyanosis, mild shortness of breath, history of anorexia and bulimia with laxative abuse.  Patient admits to malnourishment as well as active alcoholism however states this has improved over the last 10 years.  She has a history of recurrent urinary tract infections and states that she has not been without a urinary infection last 6 months.  She does not have a history of recent weight loss and reports an improvement in her weight over the last 1 month however has not been able to eat over the last 1 week.  Generally patient is Pescatarian and eats fish for protein. She had mother with breast and ovarian cancer. She is actively smoking 1.5packs daily.  She has significant oral thrush extending down past uvula on exam.   Lines/tubes : 3NS  Microbiology/Sepsis markers: Results for orders placed or performed during the hospital encounter of 10/26/19  Group A Strep by PCR ([facility] Only)     Status: None   Collection Time: 10/26/19  1:20 PM   Specimen: Throat; Sterile Swab  Result Value Ref Range Status   Group A Strep by PCR NOT DETECTED NOT DETECTED Final    Comment: Performed at [facility], [address] [place], [place] [address]  Urine culture     Status: Abnormal (Preliminary result)   Collection Time: 10/26/19  1:21 PM   Specimen: Urine, Random  Result Value Ref Range Status   Specimen Description   Final    URINE, RANDOM Performed at [facility], [address] [place], [place] [address]    Special Requests   Final    NONE Performed at [facility], [address], [place], [place] [address]    Culture (A)  Final    >=100,000 COLONIES/mL KLEBSIELLA PNEUMONIAE SUSCEPTIBILITIES TO FOLLOW Performed at [facility], [place] [address] [place], [place] [address]    Report Status PENDING  Incomplete  SARS Coronavirus 2 by RT PCR (hospital order, performed in [facility] hospital lab) Nasopharyngeal Nasopharyngeal Swab     Status: None   Collection Time: 10/26/19  1:40 PM   Specimen: Nasopharyngeal Swab  Result Value Ref Range Status   SARS Coronavirus 2 NEGATIVE NEGATIVE Final    Comment: (NOTE) SARS-CoV-2 target nucleic acids are NOT DETECTED.  The SARS-CoV-2 RNA is generally detectable in upper and lower respiratory specimens during the acute phase of infection. The lowest concentration of SARS-CoV-2 viral copies this assay can detect is 250 copies / mL. A negative result does not preclude SARS-CoV-2 infection and should not be used as the sole basis for treatment or other patient management decisions.  A negative result may occur with improper specimen collection / handling, submission of specimen other than nasopharyngeal swab, presence of viral mutation(s) within the areas targeted by this assay, and inadequate number of viral copies (<250 copies / mL). A negative result must be combined with clinical observations, patient history, and epidemiological information.  Fact Sheet for Patients:   StrictlyIdeas.no  Fact Sheet for Healthcare Providers: BankingDealers.co.za  This test is not yet approved or  cleared by the [place] FDA and has been authorized for detection and/or diagnosis of SARS-CoV-2 by FDA under an Emergency Use Authorization (EUA).  This EUA  will remain in effect (meaning this test can be used) for the duration of the COVID-19 declaration under Section 564(b)(1) of the Act, 21 U.S.C. section 360bbb-3(b)(1), unless the authorization is  terminated or revoked sooner.  Performed at [facility], [address] [place], [place] [address]   Culture, blood (routine x 2)     Status: None (Preliminary result)   Collection Time: 10/26/19  1:41 PM   Specimen: BLOOD  Result Value Ref Range Status   Specimen Description BLOOD RIGHT ANTECUBITAL  Final   Special Requests   Final    BOTTLES DRAWN AEROBIC AND ANAEROBIC Blood Culture adequate volume   Culture   Final    NO GROWTH 2 DAYS Performed at [facility], [address] [place], [place] [address]    Report Status PENDING  Incomplete  Culture, blood (routine x 2)     Status: None (Preliminary result)   Collection Time: 10/26/19  1:41 PM   Specimen: BLOOD  Result Value Ref Range Status   Specimen Description BLOOD BLOOD LEFT FOREARM  Final   Special Requests   Final    BOTTLES DRAWN AEROBIC AND ANAEROBIC Blood Culture adequate volume   Culture  Setup Time   Final    GRAM NEGATIVE RODS ANAEROBIC BOTTLE ONLY Organism ID to follow CRITICAL RESULT CALLED TO, READ BACK BY AND VERIFIED WITH: SCOTT HALL AT 0559 10/28/19 SDR Performed at [facility], [address] [place], [place] [address]    Culture GRAM NEGATIVE RODS  Final   Report Status PENDING  Incomplete  Blood Culture ID Panel (Reflexed)     Status: Abnormal   Collection Time: 10/26/19  1:41 PM  Result Value Ref Range Status   Enterococcus species NOT DETECTED NOT DETECTED Final   Listeria monocytogenes NOT DETECTED NOT DETECTED Final   Staphylococcus species NOT DETECTED NOT DETECTED Final   Staphylococcus aureus (BCID) NOT DETECTED NOT DETECTED Final   Streptococcus species NOT DETECTED NOT DETECTED Final   Streptococcus agalactiae NOT DETECTED NOT DETECTED Final   Streptococcus pneumoniae NOT DETECTED NOT DETECTED Final   Streptococcus pyogenes NOT DETECTED NOT DETECTED Final   Acinetobacter baumannii NOT DETECTED NOT DETECTED Final   Enterobacteriaceae species DETECTED (A)  NOT DETECTED Final    Comment: Enterobacteriaceae represent a large family of gram-negative bacteria, not a single organism. CRITICAL RESULT CALLED TO, READ BACK BY AND VERIFIED WITH:  SCOTT HALL AT 0559 10/28/19 SDR    Enterobacter cloacae complex NOT DETECTED NOT DETECTED Final   Escherichia coli NOT DETECTED NOT DETECTED Final   Klebsiella oxytoca NOT DETECTED NOT DETECTED Final   Klebsiella pneumoniae DETECTED (A) NOT DETECTED Final    Comment: CRITICAL RESULT CALLED TO, READ BACK BY AND VERIFIED WITH: SCOTT HALL AT 0559 10/28/19 SDR    Proteus species NOT DETECTED NOT DETECTED Final   Serratia marcescens NOT DETECTED NOT DETECTED Final   Carbapenem resistance NOT DETECTED NOT DETECTED Final   Haemophilus influenzae NOT DETECTED NOT DETECTED Final   Neisseria meningitidis NOT DETECTED NOT DETECTED Final   Pseudomonas aeruginosa NOT DETECTED NOT DETECTED Final   Candida albicans NOT DETECTED NOT DETECTED Final   Candida glabrata NOT DETECTED NOT DETECTED Final   Candida krusei NOT DETECTED NOT DETECTED Final   Candida parapsilosis NOT DETECTED NOT DETECTED Final   Candida tropicalis NOT DETECTED NOT DETECTED Final    Comment: Performed at [facility], [address] [place], [place] [address]  MRSA PCR Screening  Status: None   Collection Time: 10/27/19  2:34 AM   Specimen: Nasopharyngeal  Result Value Ref Range Status   MRSA by PCR NEGATIVE NEGATIVE Final    Comment:        The GeneXpert MRSA Assay (FDA approved for NASAL specimens only), is one component of a comprehensive MRSA colonization surveillance program. It is not intended to diagnose MRSA infection nor to guide or monitor treatment for MRSA infections. Performed at [facility], [address], [place], [place] [address]     Anti-infectives:  Anti-infectives (From admission, onward)   Start     Dose/Rate Route Frequency Ordered Stop   10/28/19 0900  cefTRIAXone (ROCEPHIN) 2 g in sodium  chloride 0.9 % 100 mL IVPB     Discontinue     2 g 200 mL/hr over 30 Minutes Intravenous Every 24 hours 10/28/19 0634     10/26/19 2200  ceFEPIme (MAXIPIME) 2 g in sodium chloride 0.9 % 100 mL IVPB  Status:  Discontinued        2 g 200 mL/hr over 30 Minutes Intravenous Every 24 hours 10/26/19 1708 10/26/19 1751   10/26/19 2000  Ampicillin-Sulbactam (UNASYN) 3 g in sodium chloride 0.9 % 100 mL IVPB     Discontinue     3 g 200 mL/hr over 30 Minutes Intravenous Every 12 hours 10/26/19 1817     10/26/19 1900  fluconazole (DIFLUCAN) IVPB 100 mg     Discontinue     100 mg 50 mL/hr over 60 Minutes Intravenous Every 24 hours 10/26/19 1757     10/26/19 1315  ceFEPIme (MAXIPIME) 1 g in sodium chloride 0.9 % 100 mL IVPB        1 g 200 mL/hr over 30 Minutes Intravenous  Once 10/26/19 1308 10/26/19 1441        PAST MEDICAL HISTORY   Past Medical History:  Diagnosis Date  . Anemia   . CKD (chronic kidney disease)   . ETOH abuse   . GERD (gastroesophageal reflux disease)   . IC (interstitial cystitis)   . Laxative abuse   . Neuropathy   . Raynaud disease      SURGICAL HISTORY   Past Surgical History:  Procedure Laterality Date  . LEG SURGERY       FAMILY HISTORY   History reviewed. No pertinent family history.   SOCIAL HISTORY   Social History   Tobacco Use  . Smoking status: Current Every Day Smoker  . Smokeless tobacco: Never Used  Substance Use Topics  . Alcohol use: Yes    Comment: 4-5 drink per week  . Drug use: Not Currently     MEDICATIONS   Current Medication:  Current Facility-Administered Medications:  .  acetaminophen (TYLENOL) tablet 650 mg, 650 mg, Oral, Q6H PRN **OR** acetaminophen (TYLENOL) suppository 650 mg, 650 mg, Rectal, Q6H PRN, Hongalgi, Anand D, MD .  albuterol (PROVENTIL) (2.5 MG/3ML) 0.083% nebulizer solution 2.5 mg, 2.5 mg, Nebulization, Q2H PRN, Hongalgi, Anand D, MD .  Ampicillin-Sulbactam (UNASYN) 3 g in sodium chloride 0.9 % 100 mL  IVPB, 3 g, Intravenous, Q12H, Shanlever, Pierce Crane, RPH, Last Rate: 200 mL/hr at 10/28/19 0803, 3 g at 10/28/19 0803 .  cefTRIAXone (ROCEPHIN) 2 g in sodium chloride 0.9 % 100 mL IVPB, 2 g, Intravenous, Q24H, Hall, Scott A, RPH, Last Rate: 200 mL/hr at 10/28/19 0919, 2 g at 10/28/19 0919 .  Chlorhexidine Gluconate Cloth 2 % PADS 6 each, 6 each, Topical, Q0600, Sharion Settler, NP,  6 each at 10/27/19 0226 .  famotidine (PEPCID) tablet 20 mg, 20 mg, Oral, Daily, Dallie Piles, RPH, 20 mg at 10/28/19 0950 .  fluconazole (DIFLUCAN) IVPB 100 mg, 100 mg, Intravenous, Q24H, Ottie Glazier, MD, Stopped at 10/27/19 1840 .  fluticasone (FLONASE) 50 MCG/ACT nasal spray 2 spray, 2 spray, Each Nare, BID, Hongalgi, Anand D, MD .  folic acid (FOLVITE) tablet 1 mg, 1 mg, Oral, Daily, Hongalgi, Anand D, MD, 1 mg at 10/28/19 0950 .  heparin injection 5,000 Units, 5,000 Units, Subcutaneous, Q8H, Modena Jansky, MD, 5,000 Units at 10/28/19 0950 .  lactated ringers infusion, , Intravenous, Continuous, Murlean Iba, MD, Last Rate: 100 mL/hr at 10/28/19 0952, New Bag at 10/28/19 0952 .  LORazepam (ATIVAN) tablet 1-4 mg, 1-4 mg, Oral, Q1H PRN, 1 mg at 10/26/19 2052 **OR** LORazepam (ATIVAN) injection 1-4 mg, 1-4 mg, Intravenous, Q1H PRN, Hongalgi, Anand D, MD, 4 mg at 10/27/19 1019 .  mometasone-formoterol (DULERA) 100-5 MCG/ACT inhaler 2 puff, 2 puff, Inhalation, BID, Hongalgi, Anand D, MD .  multivitamin with minerals tablet 1 tablet, 1 tablet, Oral, Daily, Hongalgi, Lenis Dickinson, MD, 1 tablet at 10/28/19 0950 .  nicotine (NICODERM CQ - dosed in mg/24 hours) patch 21 mg, 21 mg, Transdermal, Daily, Hongalgi, Anand D, MD, 21 mg at 10/28/19 1002 .  ondansetron (ZOFRAN) tablet 4 mg, 4 mg, Oral, Q6H PRN **OR** ondansetron (ZOFRAN) injection 4 mg, 4 mg, Intravenous, Q6H PRN, Hongalgi, Anand D, MD .  pantoprazole (PROTONIX) EC tablet 40 mg, 40 mg, Oral, Daily, Hongalgi, Anand D, MD, 40 mg at 10/28/19 0950 .  Plecanatide TABS  3 mg, 3 mg, Oral, Q0600, Hongalgi, Anand D, MD .  potassium chloride 10 mEq in 100 mL IVPB, 10 mEq, Intravenous, Q1 Hr x 4, Dallie Piles, RPH, Last Rate: 100 mL/hr at 10/28/19 0955, 10 mEq at 10/28/19 0955 .  potassium chloride SA (KLOR-CON) CR tablet 40 mEq, 40 mEq, Oral, Once, Sharion Settler, NP .  sodium chloride flush (NS) 0.9 % injection 10-40 mL, 10-40 mL, Intracatheter, Q12H, Hongalgi, Anand D, MD, 10 mL at 10/27/19 0140 .  sodium chloride flush (NS) 0.9 % injection 10-40 mL, 10-40 mL, Intracatheter, PRN, Hongalgi, Anand D, MD .  sodium chloride flush (NS) 0.9 % injection 3 mL, 3 mL, Intravenous, Q12H, Hongalgi, Anand D, MD .  tamsulosin (FLOMAX) capsule 0.4 mg, 0.4 mg, Oral, Daily, Hongalgi, Anand D, MD, 0.4 mg at 10/28/19 0950 .  thiamine tablet 100 mg, 100 mg, Oral, Daily, 100 mg at 10/28/19 0950 **OR** thiamine (B-1) injection 100 mg, 100 mg, Intravenous, Daily, Hongalgi, Anand D, MD, 100 mg at 10/27/19 0943    ALLERGIES   Cetirizine, Diazepam, and Baclofen    REVIEW OF SYSTEMS    10 point ROS done and is negative except    PHYSICAL EXAMINATION   Vital Signs: Temp:  [97.8 F (36.6 C)-99.3 F (37.4 C)] 97.8 F (36.6 C) (07/07 0153) Pulse Rate:  [53-113] 88 (07/07 0600) Resp:  [14-30] 14 (07/07 0600) BP: (96-147)/(61-86) 143/82 (07/07 0600) SpO2:  [93 %-100 %] 97 % (07/07 0600) Weight:  [57.5 kg] 57.5 kg (07/07 0155)  GENERAL:NAD mild confusion HEAD: Normocephalic, atraumatic.  EYES: Pupils equal, round, reactive to light.  No scleral icterus.  MOUTH: Moist mucosal membrane. NECK: Supple. No thyromegaly. No nodules. No JVD.  PULMONARY: CTAB CARDIOVASCULAR: S1 and S2. Regular rate and rhythm. No murmurs, rubs, or gallops.  GASTROINTESTINAL: Soft, nontender, non-distended. No masses. Positive bowel sounds. No  hepatosplenomegaly.  MUSCULOSKELETAL: No swelling, clubbing, or edema.  NEUROLOGIC: Mild distress due to acute  illness SKIN:intact,warm,dry   PERTINENT DATA     Infusions: . ampicillin-sulbactam (UNASYN) IV 3 g (10/28/19 0803)  . cefTRIAXone (ROCEPHIN)  IV 2 g (10/28/19 0919)  . fluconazole (DIFLUCAN) IV Stopped (10/27/19 1840)  . lactated ringers 100 mL/hr at 10/28/19 0952  . potassium chloride 10 mEq (10/28/19 0955)   Scheduled Medications: . Chlorhexidine Gluconate Cloth  6 each Topical Q0600  . famotidine  20 mg Oral Daily  . fluticasone  2 spray Each Nare BID  . folic acid  1 mg Oral Daily  . heparin  5,000 Units Subcutaneous Q8H  . mometasone-formoterol  2 puff Inhalation BID  . multivitamin with minerals  1 tablet Oral Daily  . nicotine  21 mg Transdermal Daily  . pantoprazole  40 mg Oral Daily  . Plecanatide  3 mg Oral Q0600  . potassium chloride  40 mEq Oral Once  . sodium chloride flush  10-40 mL Intracatheter Q12H  . sodium chloride flush  3 mL Intravenous Q12H  . tamsulosin  0.4 mg Oral Daily  . thiamine  100 mg Oral Daily   Or  . thiamine  100 mg Intravenous Daily   PRN Medications: acetaminophen **OR** acetaminophen, albuterol, LORazepam **OR** LORazepam, ondansetron **OR** ondansetron (ZOFRAN) IV, sodium chloride flush Hemodynamic parameters:   Intake/Output: 07/06 0701 - 07/07 0700 In: 3357.6 [I.V.:3002.3; IV Piggyback:355.3] Out: 1025 [Urine:1025]  Ventilator  Settings:      LAB RESULTS:  Basic Metabolic Panel: Recent Labs  Lab 10/26/19 1150 10/26/19 1303 10/26/19 2354 10/26/19 2354 10/27/19 0241 10/27/19 0241 10/27/19 0545 10/27/19 0545 10/27/19 0957 10/27/19 1601 10/27/19 2058 10/27/19 2329 10/28/19 0654  NA 118*   < > 119*   < > 118*   < > 123*   < > 125* 129* 131* 133* 135  K 3.8   < > 3.4*   < > 3.1*   < > 2.8*   < > 2.4*  --   --   --  2.3*  CL 86*   < > 87*  --  87*  --  88*  --  89*  --   --   --  92*  CO2 7*   < > 9*  --  9*  --  10*  --  12*  --   --   --  25  GLUCOSE 86   < > 85  --  82  --  93  --  110*  --   --   --  144*  BUN  137*   < > 127*  --  133*  --  138*  --  134*  --   --   --  119*  CREATININE 4.88*   < > 4.94*  --  4.78*  --  4.71*  --  4.58*  --   --   --  4.18*  CALCIUM 8.1*   < > 7.2*  --  7.1*  --  6.9*  --  6.6*  --   --   --  6.9*  MG 3.0*  --   --   --   --   --  2.3  --   --   --   --   --  2.3  PHOS  --   --   --   --   --   --  7.2*  --   --   --   --   --  4.5   < > = values in this interval not displayed.   Liver Function Tests: Recent Labs  Lab 10/26/19 1150 10/27/19 0545  AST 39 37  ALT 31 24  ALKPHOS 177* 144*  BILITOT 1.6* 1.8*  PROT 7.4 5.9*  ALBUMIN 2.9* 2.2*   Recent Labs  Lab 10/26/19 1150  LIPASE 24   Recent Labs  Lab 10/26/19 1150  AMMONIA 16   CBC: Recent Labs  Lab 10/26/19 1150 10/27/19 0545 10/28/19 0654  WBC 21.6* 17.6* 19.5*  NEUTROABS 17.9*  --   --   HGB 12.3 10.1* 10.4*  HCT 33.2* 27.3* 27.8*  MCV 85.8 84.8 83.2  PLT 267 228 231   Cardiac Enzymes: No results for input(s): CKTOTAL, CKMB, CKMBINDEX, TROPONINI in the last 168 hours. BNP: Invalid input(s): POCBNP CBG: No results for input(s): GLUCAP in the last 168 hours.     IMAGING RESULTS:  Imaging: DG Chest 2 View  Result Date: 10/26/2019 CLINICAL DATA:  Shortness of breath, slurred speech and dizziness. EXAM: CHEST - 2 VIEW COMPARISON:  None FINDINGS: Trachea midline. Cardiomediastinal contours and hilar structures are normal. Mild increased interstitial change in the lingula and likely in RIGHT middle lobe. No lobar level consolidative change.  No sign of pleural effusion. On limited assessment skeletal structures are unremarkable. IMPRESSION: Mild increased interstitial prominence without lobar consolidation predominantly in lingula and RIGHT middle lobe lower lobe. Correlate with any history of chronic infection. Findings could also be due to mild and or developing pneumonitis would also correlate for any risk factors for aspiration. Electronically Signed   By: Zetta Bills M.D.   On:  10/26/2019 12:54   CT Head Wo Contrast  Result Date: 10/26/2019 CLINICAL DATA:  Mental status change and dysarthria. EXAM: CT HEAD WITHOUT CONTRAST TECHNIQUE: Contiguous axial images were obtained from the base of the skull through the vertex without intravenous contrast. COMPARISON:  None. FINDINGS: Brain: No evidence of acute infarction, hemorrhage, hydrocephalus, extra-axial collection or mass lesion/mass effect. Vascular: No hyperdense vessel or unexpected calcification. Skull: Normal. Negative for fracture or focal lesion. Sinuses/Orbits: No acute finding. Other: None. IMPRESSION: No focal acute intracranial abnormality identified. Electronically Signed   By: Abelardo Diesel M.D.   On: 10/26/2019 13:06   US RENAL  Result Date: 10/26/2019 CLINICAL DATA:  Acute renal failure EXAM: RENAL / URINARY TRACT ULTRASOUND COMPLETE COMPARISON:  None. FINDINGS: Right Kidney: Renal measurements: 12.0 x 5.3 x 6.9 cm = volume: 227 mL . Echogenicity is increased. No mass or hydronephrosis visualized. Left Kidney: Renal measurements: 10.5 x 5.6 x 4.7 cm = volume: 147 mL. Echogenicity is increased. No mass or hydronephrosis visualized. Bladder: Debris is seen within the bladder. Other: None. IMPRESSION: Increased renal cortical echogenicity may reflect chronic renal disease. No hydronephrosis. Electronically Signed   By: Zerita Boers M.D.   On: 10/26/2019 17:23   @PROBHOSP @ No results found.     ASSESSMENT AND PLAN    -Multidisciplinary rounds held today   Altered mental status with confusion  - due to hyponatremia with toxic metabolic encephalopathy and intercurrent UTI with interstitial nephritis -slow correction of Na per nephro-improved 10/28/19  COPD with centrilobular emphysema   -continue home Advair   - no signs of exacerbation during examination today   Sepsis with multi organ dysfunction syndrome  - present on admission - due to resistant Klebsiella bacteremia from Klebsiella UTI  - discussed  with pharmD - reviewed previous sensitivities - will narrow regimen to Rocephin and flagyl-no ESBL  per previous cx  - patient is not currently requiring vasopressor support -ID on case - appreciate input   Aspiration pneumonia   - hx of EtOH   -Unasyn    -monitor signs of whithdrawal   - CIWA protocol   - thiamine/folate repletion   Oral thrush   - HCV/HIV eval negative    - patient also reports hx of vaginal yeast infection    - Diflucan 100 IV daily x5d    Severe protein calorie malnutrition   -low albumin   - bitemporal and peripheral muscle wasting.    ID -continue IV abx as prescibed -follow up cultures  GI/Nutrition GI PROPHYLAXIS as indicated DIET-->TF's as tolerated Constipation protocol as indicated  ENDO - ICU hypoglycemic\Hyperglycemia protocol -check FSBS per protocol   ELECTROLYTES -follow labs as needed -replace as needed -pharmacy consultation   DVT/GI PRX ordered -SCDs  TRANSFUSIONS AS NEEDED MONITOR FSBS ASSESS the need for LABS as needed   Critical care provider statement:    Critical care time (minutes):  34   Critical care time was exclusive of:  Separately billable procedures and treating other patients   Critical care was necessary to treat or prevent imminent or life-threatening deterioration of the following conditions:  Altered mental status, confusion, AKI, anorexia, severe protein cal malnutrition   Critical care was time spent personally by me on the following activities:  Development of treatment plan with patient or surrogate, discussions with consultants, evaluation of patient's response to treatment, examination of patient, obtaining history from patient or surrogate, ordering and performing treatments and interventions, ordering and review of laboratory studies and re-evaluation of patient's condition.  I assumed direction of critical care for this patient from another provider in my specialty: no    This document was prepared  using Dragon voice recognition software and may include unintentional dictation errors.    Ottie Glazier, M.D.  Division of [place]

## 2019-10-28 NOTE — Progress Notes (Signed)
[address] [place], [place] [address] Phone [phone]. Fax [phone]  Date: 10/28/2019                  Patient Name:  Katherine Perez  MRN: [id number]  DOB: 15-Feb-1971  Age / Sex: 49 y.o., female         PCP: Patient, No Pcp Per                 Service Requesting Consult: IM/ Bonnielee Haff, MD                 Reason for Consult: ARF            History of Present Illness: Patient is a 49 y.o. female with medical problems of multiple medical problems, who was admitted to [facility] on 10/26/2019 for evaluation of confusion and dysarthria.   Presented for dysarthryia and word finding difficulty. Fingers noted to be purple. Also concern of Shortness of breath and confusion. H/o laxative abuse. Ongoing alcohol abuse - 4-5 drinks per husband H/o colonic intertia, chronic constipation, pelvic floor dysfuncion, gastroparesis, malnutrition  + NSAIDs- diclonofenac-misoprostol started 09/25/2019  Patient remains very lethargic this morning Nursing staff report that she is not eating much.  Patient's husband is at bedside Lab results were reviewed on the computer and discussed with her husband  Current medications: Current Facility-Administered Medications  Medication Dose Route Frequency Provider Last Rate Last Admin  . acetaminophen (TYLENOL) tablet 650 mg  650 mg Oral Q6H PRN Hongalgi, Lenis Dickinson, MD       Or  . acetaminophen (TYLENOL) suppository 650 mg  650 mg Rectal Q6H PRN Hongalgi, Anand D, MD      . albuterol (PROVENTIL) (2.5 MG/3ML) 0.083% nebulizer solution 2.5 mg  2.5 mg Nebulization Q2H PRN Hongalgi, Anand D, MD      . Ampicillin-Sulbactam (UNASYN) 3 g in sodium chloride 0.9 % 100 mL IVPB  3 g Intravenous Q12H Lu Duffel, RPH 200 mL/hr at 10/28/19 0803 3 g at 10/28/19 0803  . cefTRIAXone (ROCEPHIN) 2 g in sodium chloride 0.9 % 100 mL IVPB  2 g Intravenous Q24H Hall, Scott A, RPH 200 mL/hr at 10/28/19 0919 2 g at 10/28/19 0919  . Chlorhexidine Gluconate Cloth  2 % PADS 6 each  6 each Topical Q0600 Sharion Settler, NP   6 each at 10/27/19 0226  . famotidine (PEPCID) tablet 20 mg  20 mg Oral Daily Dallie Piles, [facility]      . fluconazole (DIFLUCAN) IVPB 100 mg  100 mg Intravenous Q24H Ottie Glazier, MD   Stopped at 10/27/19 1840  . fluticasone (FLONASE) 50 MCG/ACT nasal spray 2 spray  2 spray Each Nare BID Hongalgi, Anand D, MD      . folic acid (FOLVITE) tablet 1 mg  1 mg Oral Daily Modena Jansky, MD   1 mg at 10/26/19 1853  . heparin injection 5,000 Units  5,000 Units Subcutaneous Q8H Modena Jansky, MD   5,000 Units at 10/28/19 0149  . LORazepam (ATIVAN) tablet 1-4 mg  1-4 mg Oral Q1H PRN Modena Jansky, MD   1 mg at 10/26/19 2052   Or  . LORazepam (ATIVAN) injection 1-4 mg  1-4 mg Intravenous Q1H PRN Modena Jansky, MD   4 mg at 10/27/19 1019  . mometasone-formoterol (DULERA) 100-5 MCG/ACT inhaler 2 puff  2 puff Inhalation BID Hongalgi, Anand D, MD      . multivitamin with minerals tablet 1 tablet  1 tablet Oral Daily Modena Jansky, MD   1 tablet at 10/26/19 2956  . nicotine (NICODERM CQ - dosed in mg/24 hours) patch 21 mg  21 mg Transdermal Daily Hongalgi, Anand D, MD      . ondansetron (ZOFRAN) tablet 4 mg  4 mg Oral Q6H PRN Hongalgi, Lenis Dickinson, MD       Or  . ondansetron (ZOFRAN) injection 4 mg  4 mg Intravenous Q6H PRN Hongalgi, Anand D, MD      . pantoprazole (PROTONIX) EC tablet 40 mg  40 mg Oral Daily Modena Jansky, MD   40 mg at 10/26/19 1852  . Plecanatide TABS 3 mg  3 mg Oral Q0600 Hongalgi, Anand D, MD      . potassium chloride 10 mEq in 100 mL IVPB  10 mEq Intravenous Q1 Hr x 4 Dallie Piles, RPH      . potassium chloride SA (KLOR-CON) CR tablet 40 mEq  40 mEq Oral Once Sharion Settler, NP      . sodium bicarbonate 150 mEq in dextrose 5% 1000 mL infusion  150 mEq Intravenous Continuous Awilda Bill, NP 100 mL/hr at 10/28/19 0620 Rate Verify at 10/28/19 0620  . sodium chloride flush (NS) 0.9 % injection 10-40 mL   10-40 mL Intracatheter Q12H Hongalgi, Lenis Dickinson, MD   10 mL at 10/27/19 0140  . sodium chloride flush (NS) 0.9 % injection 10-40 mL  10-40 mL Intracatheter PRN Hongalgi, Anand D, MD      . sodium chloride flush (NS) 0.9 % injection 3 mL  3 mL Intravenous Q12H Hongalgi, Anand D, MD      . tamsulosin (FLOMAX) capsule 0.4 mg  0.4 mg Oral Daily Modena Jansky, MD   0.4 mg at 10/26/19 1853  . thiamine tablet 100 mg  100 mg Oral Daily Vernell Leep D, MD   100 mg at 10/26/19 1853   Or  . thiamine (B-1) injection 100 mg  100 mg Intravenous Daily Vernell Leep D, MD   100 mg at 10/27/19 0943      Vital Signs: Blood pressure (!) 143/82, pulse 88, temperature 97.8 F (36.6 C), temperature source Oral, resp. rate 14, height 5\' 9"  (1.753 m), weight 57.5 kg, SpO2 97 %.   Intake/Output Summary (Last 24 hours) at 10/28/2019 0935 Last data filed at 10/28/2019 2130 Gross per 24 hour  Intake 2773.52 ml  Output 1025 ml  Net 1748.52 ml    Weight trends: Filed Weights   10/26/19 1128 10/27/19 0230 10/28/19 0155  Weight: 63.5 kg 57.4 kg 57.5 kg    Physical Exam: General:  NAD  HEENT Dry oral mucus membranes  Neck:  supple, no JVD  Lungs: Coarse breath sounds b/l, room air  Heart::  regular  Abdomen: Soft, nontender  Extremities:  no edema  Neurologic:  Very lethargic, arousable, able to follow few simple commands  Skin: No acute rashes,    Lab results: Basic Metabolic Panel: Recent Labs  Lab 10/26/19 1150 10/26/19 1303 10/27/19 0545 10/27/19 0545 10/27/19 0957 10/27/19 1601 10/27/19 2058 10/27/19 2329 10/28/19 0654  NA 118*   < > 123*   < > 125*   < > 131* 133* 135  K 3.8   < > 2.8*  --  2.4*  --   --   --  2.3*  CL 86*   < > 88*  --  89*  --   --   --  92*  CO2 7*   < >  10*  --  12*  --   --   --  25  GLUCOSE 86   < > 93  --  110*  --   --   --  144*  BUN 137*   < > 138*  --  134*  --   --   --  119*  CREATININE 4.88*   < > 4.71*  --  4.58*  --   --   --  4.18*  CALCIUM 8.1*    < > 6.9*  --  6.6*  --   --   --  6.9*  MG 3.0*  --  2.3  --   --   --   --   --  2.3  PHOS  --   --  7.2*  --   --   --   --   --  4.5   < > = values in this interval not displayed.    Liver Function Tests: Recent Labs  Lab 10/27/19 0545  AST 37  ALT 24  ALKPHOS 144*  BILITOT 1.8*  PROT 5.9*  ALBUMIN 2.2*   Recent Labs  Lab 10/26/19 1150  LIPASE 24   Recent Labs  Lab 10/26/19 1150  AMMONIA 16    CBC: Recent Labs  Lab 10/26/19 1150 10/26/19 1150 10/27/19 0545 10/28/19 0654  WBC 21.6*   < > 17.6* 19.5*  NEUTROABS 17.9*  --   --   --   HGB 12.3   < > 10.1* 10.4*  HCT 33.2*   < > 27.3* 27.8*  MCV 85.8   < > 84.8 83.2  PLT 267   < > 228 231   < > = values in this interval not displayed.    Cardiac Enzymes: No results for input(s): CKTOTAL, TROPONINI in the last 168 hours.  BNP: Invalid input(s): POCBNP  CBG: No results for input(s): GLUCAP in the last 168 hours.  Microbiology: Recent Results (from the past 720 hour(s))  Group A Strep by PCR ([facility] Only)     Status: None   Collection Time: 10/26/19  1:20 PM   Specimen: Throat; Sterile Swab  Result Value Ref Range Status   Group A Strep by PCR NOT DETECTED NOT DETECTED Final    Comment: Performed at [facility], [address], [place], [place] [address]  Urine culture     Status: Abnormal (Preliminary result)   Collection Time: 10/26/19  1:21 PM   Specimen: Urine, Random  Result Value Ref Range Status   Specimen Description   Final    URINE, RANDOM Performed at [facility], [address], [place], [place] [address]    Special Requests   Final    NONE Performed at [facility], [address], [place], [place] [address]    Culture (A)  Final    >=100,000 COLONIES/mL Lonell Grandchild NEGATIVE RODS SUSCEPTIBILITIES TO FOLLOW Performed at [facility], [place] [address], [place], [place] [address]    Report Status PENDING  Incomplete  SARS Coronavirus 2 by RT PCR (hospital  order, performed in [facility] hospital lab) Nasopharyngeal Nasopharyngeal Swab     Status: None   Collection Time: 10/26/19  1:40 PM   Specimen: Nasopharyngeal Swab  Result Value Ref Range Status   SARS Coronavirus 2 NEGATIVE NEGATIVE Final    Comment: (NOTE) SARS-CoV-2 target nucleic acids are NOT DETECTED.  The SARS-CoV-2 RNA is generally detectable in upper and lower respiratory specimens during the acute  phase of infection. The lowest concentration of SARS-CoV-2 viral copies this assay can detect is 250 copies / mL. A negative result does not preclude SARS-CoV-2 infection and should not be used as the sole basis for treatment or other patient management decisions.  A negative result may occur with improper specimen collection / handling, submission of specimen other than nasopharyngeal swab, presence of viral mutation(s) within the areas targeted by this assay, and inadequate number of viral copies (<250 copies / mL). A negative result must be combined with clinical observations, patient history, and epidemiological information.  Fact Sheet for Patients:   StrictlyIdeas.no  Fact Sheet for Healthcare Providers: BankingDealers.co.za  This test is not yet approved or  cleared by the [place] FDA and has been authorized for detection and/or diagnosis of SARS-CoV-2 by FDA under an Emergency Use Authorization (EUA).  This EUA will remain in effect (meaning this test can be used) for the duration of the COVID-19 declaration under Section 564(b)(1) of the Act, 21 U.S.C. section 360bbb-3(b)(1), unless the authorization is terminated or revoked sooner.  Performed at [facility], [address] [place], [place] [address]   Culture, blood (routine x 2)     Status: None (Preliminary result)   Collection Time: 10/26/19  1:41 PM   Specimen: BLOOD  Result Value Ref Range Status   Specimen Description BLOOD RIGHT ANTECUBITAL   Final   Special Requests   Final    BOTTLES DRAWN AEROBIC AND ANAEROBIC Blood Culture adequate volume   Culture   Final    NO GROWTH 2 DAYS Performed at [facility], [address], [place], [place] [address]    Report Status PENDING  Incomplete  Culture, blood (routine x 2)     Status: None (Preliminary result)   Collection Time: 10/26/19  1:41 PM   Specimen: BLOOD  Result Value Ref Range Status   Specimen Description BLOOD BLOOD LEFT FOREARM  Final   Special Requests   Final    BOTTLES DRAWN AEROBIC AND ANAEROBIC Blood Culture adequate volume   Culture  Setup Time   Final    GRAM NEGATIVE RODS ANAEROBIC BOTTLE ONLY Organism ID to follow CRITICAL RESULT CALLED TO, READ BACK BY AND VERIFIED WITH: SCOTT HALL AT 0559 10/28/19 SDR Performed at [facility], [address] [place], [place] [address]    Culture GRAM NEGATIVE RODS  Final   Report Status PENDING  Incomplete  Blood Culture ID Panel (Reflexed)     Status: Abnormal   Collection Time: 10/26/19  1:41 PM  Result Value Ref Range Status   Enterococcus species NOT DETECTED NOT DETECTED Final   Listeria monocytogenes NOT DETECTED NOT DETECTED Final   Staphylococcus species NOT DETECTED NOT DETECTED Final   Staphylococcus aureus (BCID) NOT DETECTED NOT DETECTED Final   Streptococcus species NOT DETECTED NOT DETECTED Final   Streptococcus agalactiae NOT DETECTED NOT DETECTED Final   Streptococcus pneumoniae NOT DETECTED NOT DETECTED Final   Streptococcus pyogenes NOT DETECTED NOT DETECTED Final   Acinetobacter baumannii NOT DETECTED NOT DETECTED Final   Enterobacteriaceae species DETECTED (A) NOT DETECTED Final    Comment: Enterobacteriaceae represent a large family of gram-negative bacteria, not a single organism. CRITICAL RESULT CALLED TO, READ BACK BY AND VERIFIED WITH:  SCOTT HALL AT 0559 10/28/19 SDR    Enterobacter cloacae complex NOT DETECTED NOT DETECTED Final   Escherichia coli NOT DETECTED NOT  DETECTED Final   Klebsiella oxytoca NOT DETECTED NOT DETECTED Final  Klebsiella pneumoniae DETECTED (A) NOT DETECTED Final    Comment: CRITICAL RESULT CALLED TO, READ BACK BY AND VERIFIED WITH: SCOTT HALL AT 0559 10/28/19 SDR    Proteus species NOT DETECTED NOT DETECTED Final   Serratia marcescens NOT DETECTED NOT DETECTED Final   Carbapenem resistance NOT DETECTED NOT DETECTED Final   Haemophilus influenzae NOT DETECTED NOT DETECTED Final   Neisseria meningitidis NOT DETECTED NOT DETECTED Final   Pseudomonas aeruginosa NOT DETECTED NOT DETECTED Final   Candida albicans NOT DETECTED NOT DETECTED Final   Candida glabrata NOT DETECTED NOT DETECTED Final   Candida krusei NOT DETECTED NOT DETECTED Final   Candida parapsilosis NOT DETECTED NOT DETECTED Final   Candida tropicalis NOT DETECTED NOT DETECTED Final    Comment: Performed at [facility], [address] [place], [place] [address]  MRSA PCR Screening     Status: None   Collection Time: 10/27/19  2:34 AM   Specimen: Nasopharyngeal  Result Value Ref Range Status   MRSA by PCR NEGATIVE NEGATIVE Final    Comment:        The GeneXpert MRSA Assay (FDA approved for NASAL specimens only), is one component of a comprehensive MRSA colonization surveillance program. It is not intended to diagnose MRSA infection nor to guide or monitor treatment for MRSA infections. Performed at [facility], [address] [place], [place] [address]      Coagulation Studies: No results for input(s): LABPROT, INR in the last 72 hours.  Urinalysis: Recent Labs    10/26/19 1231  COLORURINE BIOCHEMICALS MAY BE AFFECTED BY COLOR*  LABSPEC 1.008  PHURINE 5.0  GLUCOSEU NEGATIVE  HGBUR MODERATE*  BILIRUBINUR NEGATIVE  KETONESUR 5*  PROTEINUR 30*  NITRITE POSITIVE*  LEUKOCYTESUR LARGE*        Imaging: DG Chest 2 View  Result Date: 10/26/2019 CLINICAL DATA:  Shortness of breath, slurred speech and dizziness. EXAM:  CHEST - 2 VIEW COMPARISON:  None FINDINGS: Trachea midline. Cardiomediastinal contours and hilar structures are normal. Mild increased interstitial change in the lingula and likely in RIGHT middle lobe. No lobar level consolidative change.  No sign of pleural effusion. On limited assessment skeletal structures are unremarkable. IMPRESSION: Mild increased interstitial prominence without lobar consolidation predominantly in lingula and RIGHT middle lobe lower lobe. Correlate with any history of chronic infection. Findings could also be due to mild and or developing pneumonitis would also correlate for any risk factors for aspiration. Electronically Signed   By: Zetta Bills M.D.   On: 10/26/2019 12:54   CT Head Wo Contrast  Result Date: 10/26/2019 CLINICAL DATA:  Mental status change and dysarthria. EXAM: CT HEAD WITHOUT CONTRAST TECHNIQUE: Contiguous axial images were obtained from the base of the skull through the vertex without intravenous contrast. COMPARISON:  None. FINDINGS: Brain: No evidence of acute infarction, hemorrhage, hydrocephalus, extra-axial collection or mass lesion/mass effect. Vascular: No hyperdense vessel or unexpected calcification. Skull: Normal. Negative for fracture or focal lesion. Sinuses/Orbits: No acute finding. Other: None. IMPRESSION: No focal acute intracranial abnormality identified. Electronically Signed   By: Abelardo Diesel M.D.   On: 10/26/2019 13:06   US RENAL  Result Date: 10/26/2019 CLINICAL DATA:  Acute renal failure EXAM: RENAL / URINARY TRACT ULTRASOUND COMPLETE COMPARISON:  None. FINDINGS: Right Kidney: Renal measurements: 12.0 x 5.3 x 6.9 cm = volume: 227 mL . Echogenicity is increased. No mass or hydronephrosis visualized. Left Kidney: Renal measurements: 10.5 x 5.6 x 4.7 cm = volume: 147 mL. Echogenicity is  increased. No mass or hydronephrosis visualized. Bladder: Debris is seen within the bladder. Other: None. IMPRESSION: Increased renal cortical echogenicity may  reflect chronic renal disease. No hydronephrosis. Electronically Signed   By: Zerita Boers M.D.   On: 10/26/2019 17:23     Assessment & Plan: Pt is a 49 y.o. 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, pelvic floor dysfunction, acid reflux, gastroparesis, iron deficiency anemia, malnutrition, alcohol abuse, tobacco abuse, anemia, asthma, chronic kidney disease, laxative abuse, recurrent UTIs, interstitial cystitis, Raynaud's disease, was admitted on 10/26/2019 with Dehydration [E86.0] Hyponatremia [E87.1] ARF (acute renal failure) (HCC) [N17.9] AKI (acute kidney injury) ([place]) [N17.9] Urinary tract infection with hematuria, site unspecified [N39.0, R31.9]   # ARF # Hyponatremia # Substance abuse - alcohol # heavy smoker 1.5 ppd # severe acidosis # pyuria (chronic interstitial cystitis) #Sepsis-Klebsiella and Enterococcus in blood #Severe hypokalemia  ARF is likely multifactorial from volume depletion, sepsis, and NSAIDs leading to ATN No recent iv contrast exposure -Baseline Cr 1.40/ GFR 45 from 09/23/2019 -US renal: Debris seen within bladder.  Increased renal cortical echogenicity.  No hydronephrosis - gentle volume repletion -Hold NSAIDs, avoid hypotension  Lab Results  Component Value Date   CREATININE 4.18 (H) 10/28/2019   CREATININE 4.58 (H) 10/27/2019   CREATININE 4.71 (H) 10/27/2019   Serum creatinine trend is improving slowly UOP >1000 cc Electrolytes and volume status are acceptable.  No acute indication for dialysis.  Hyponatremia is also multifactorial with contribution from volume depletion, alcohol abuse. Other ddx includes SIADH -Sodium has improved slowly with 3% saline given at a lower rate of 25 cc/h.  It was discontinued yesterday when sodium improved to 131 -Sodium level now up to 135 Patient's husband reports that she drinks excessive amount of water at home but does not eat much IV fluids now changed to LR at 100 cc/h for volume  repletion  Alcohol withdrawal precautions as per ICU team Possible aspiration pneumonia treatment as per ICU  Sepsis With Enterobacter species and Klebsiella detected in the blood Greater than 100,000 Klebsiella in the urine Sepsis is likely from urinary source Primary team is also contemplating a CT of the abdomen noncontrast  Hypokalemia Replace IV as needed Discontinue bicarb and change IV fluids to lactated Ringer    LOS: 2 Luvina Poirier 7/7/20219:35 AM    Note: This note was prepared with Dragon dictation. Any transcription errors are unintentional

## 2019-10-28 NOTE — Progress Notes (Addendum)
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  Abuse, anemia, anorexia, asthma, stage III CKD, laxative abuse, recurrent UTI/interstitial cystitis, Raynaud's disease, ongoing tobacco abuse presented to [facility] ED with poor appetite, decreased energy lethargy and confusion.  She was found to have severe hyponatremia, urine concerning for infection chest x-ray also concerning for infection.  She was hospitalized for further management.    Reason for Visit: Hyponatremia  Consultants: Nephrology.  Critical care medicine.  Procedures: None yet  Antibiotics: Anti-infectives (From admission, onward)   Start     Dose/Rate Route Frequency Ordered Stop   10/28/19 0900  cefTRIAXone (ROCEPHIN) 2 g in sodium chloride 0.9 % 100 mL IVPB     Discontinue     2 g 200 mL/hr over 30 Minutes Intravenous Every 24 hours 10/28/19 0634     10/26/19 2200  ceFEPIme (MAXIPIME) 2 g in sodium chloride 0.9 % 100 mL IVPB  Status:  Discontinued        2 g 200 mL/hr over 30 Minutes Intravenous Every 24 hours 10/26/19 1708 10/26/19 1751   10/26/19 2000  Ampicillin-Sulbactam (UNASYN) 3 g in sodium chloride 0.9 % 100 mL IVPB     Discontinue     3 g 200 mL/hr over 30 Minutes Intravenous Every 12 hours 10/26/19 1817     10/26/19 1900  fluconazole (DIFLUCAN) IVPB 100 mg     Discontinue     100 mg 50 mL/hr over 60 Minutes Intravenous Every 24 hours 10/26/19 1757     10/26/19 1315  ceFEPIme (MAXIPIME) 1 g in sodium chloride 0.9 %  100 mL IVPB        1 g 200 mL/hr over 30 Minutes Intravenous  Once 10/26/19 1308 10/26/19 1441      Subjective/Interval History: Patient noted to be much more responsive today compared to yesterday.  Still confused and distracted.  Unable to answer questions appropriately.  But does follow commands.    ROS: Unable to do due to her encephalopathy.    Assessment/Plan:  Hyponatremia Appears to be acute.  Her sodium level was 134 on 6/2.  Presented with a sodium of 118.  Likely multifactorial including dehydration, alcohol abuse.  Patient was given IV fluids with no improvement in sodium level.  Nephrology was consulted.  Urine osmolality noted to be 233.  Serum osmolality 295.  Patient was subsequently started on 3% saline. Her sodium has responded.  Her 3% saline was discontinued last night.  Sodium is 135 this morning.  Nephrology continues to follow.    Acute metabolic encephalopathy Most likely due to combination of hyponatremia, infection, acidosis, alcohol withdrawal.  No focal neurological deficits noted.  Seems to be a bit more responsive today compared to yesterday.  CT head did not show any acute findings.  Continue thiamine.  Continue CIWA protocol.   Acute on chronic kidney disease stage III/hypokalemia Creatinine was 1.4 on June 2.  Presented with a creatinine of 4.88.  Most likely due to NSAID use and dehydration.   She was started on IV fluids.  Nephrology is following.  Creatinine has improved to 4.18 today.  BUN is also improved to 119.  Potassium level remains low.  This is being repleted by pharmacy in the ICU.  Nephrology also assisting.  Her bicarbonate level has also improved.  She was on a bicarbonate infusion.  This has been changed over to LR this morning.   Continue to monitor labs and monitor urine output.  Renal ultrasound does not show any hydronephrosis.  Elevated anion gap metabolic acidosis Most likely due to acute kidney injury and hypovolemia.  Alcohol  likely contributing as well.  She is not a diabetic.  Ketones noted in the urine however this could be due to starvation.  Metabolic acidosis has resolved this morning.  Bicarbonate infusion has been discontinued.  Klebsiella bacteremia/UTI/chronic interstitial cystitis/sepsis present on admission. This was reported last night.  Patient was on Unasyn for aspiration pneumonia.  It appears that ceftriaxone has been added.  Source of this bacteremia is most likely GU tract.  Noted to be abnormal with positive nitrite large leukocytes and more than 50 WBC.  Urine culture is growing gram-negative bacteria.  Nonspecific abdominal tenderness on examination.  Will do CT scan of her abdomen and pelvis.  Oral candidiasis On Diflucan per PCCM.  Suspected aspiration pneumonia Continue Unasyn  Leukocytosis Secondary to infection as well as reactive component.   Normocytic anemia Likely due to acute illness.  No evidence for overt bleeding.  Continue to monitor.  Chronic constipation and anorexia Bowel regimen to continue.  History of asthma Patient noted to be on inhalers.  Also noted to be on theophylline.  Will hold for now.  Theophylline level is 17.8.  Tobacco abuse Nicotine patch  History of alcohol abuse Continue CIWA protocol.  Prolonged QTC Avoid QT prolonging medications.  Check EKGs periodically.  Correct electrolytes.  Pressure injury Pressure Injury 10/27/19 Sacrum Stage 1 -  Intact skin with non-blanchable redness of a localized area usually over a bony prominence. (Active)  10/27/19 0820  Location: Sacrum  Location Orientation:   Staging: Stage 1 -  Intact skin with non-blanchable redness of a localized area usually over a bony prominence.  Wound Description (Comments):   Present on Admission: Yes    DVT Prophylaxis: Subcutaneous heparin Code Status: Full code Family Communication: No family at bedside. Disposition Plan:  Status is: Inpatient  Remains inpatient  appropriate because:Persistent severe electrolyte disturbances   Dispo:  Patient From: Home  Planned Disposition: To be determined  Expected discharge date: 11/02/19  Medically stable for discharge: No     Medications:  Scheduled: . Chlorhexidine Gluconate Cloth  6 each Topical Q0600  . famotidine  20 mg Oral Daily  . fluticasone  2 spray Each Nare BID  . folic acid  1 mg Oral Daily  . heparin  5,000 Units Subcutaneous Q8H  . mometasone-formoterol  2 puff Inhalation BID  . multivitamin with minerals  1 tablet Oral Daily  . nicotine  21 mg Transdermal Daily  . pantoprazole  40 mg Oral Daily  . Plecanatide  3 mg Oral Q0600  . potassium chloride  40 mEq Oral Once  . sodium chloride flush  10-40 mL Intracatheter Q12H  . sodium chloride flush  3 mL Intravenous Q12H  . tamsulosin  0.4 mg Oral Daily  . thiamine  100 mg Oral Daily   Or  . thiamine  100 mg Intravenous Daily   Continuous: . ampicillin-sulbactam (UNASYN) IV 3 g (10/28/19 0803)  . cefTRIAXone (ROCEPHIN)  IV 2 g (10/28/19 0919)  . fluconazole (DIFLUCAN) IV Stopped (  10/27/19 1840)  . potassium chloride    . sodium bicarbonate 150 mEq in dextrose 5% 1000 mL 100 mL/hr at 10/28/19 0620   XFG:[id number] **OR** acetaminophen, albuterol, LORazepam **OR** LORazepam, ondansetron **OR** ondansetron (ZOFRAN) IV, sodium chloride flush   Objective:  Vital Signs  Vitals:   10/28/19 0300 10/28/19 0400 10/28/19 0500 10/28/19 0600  BP: 138/69 (!) 143/77 (!) 147/86 (!) 143/82  Pulse: 98 (!) 53 99 88  Resp: 18 (!) 21 20 14   Temp:      TempSrc:      SpO2: 99% 98% 99% 97%  Weight:      Height:        Intake/Output Summary (Last 24 hours) at 10/28/2019 0944 Last data filed at 10/28/2019 9381 Gross per 24 hour  Intake 2773.52 ml  Output 1025 ml  Net 1748.52 ml   Filed Weights   10/26/19 1128 10/27/19 0230 10/28/19 0155  Weight: 63.5 kg 57.4 kg 57.5 kg    General appearance: Lethargic but easily arousable.   Distracted. Resp: Clear to auscultation bilaterally.  Normal effort Cardio: S1-S2 is normal regular.  No S3-S4.  No rubs murmurs or bruit GI: Abdomen is soft.  Nonspecific tenderness appreciated throughout the abdomen without any rebound rigidity or guarding.  No masses organomegaly.  Extremities: No edema.  Noted to be moving all her extremities. Neurologic: Remains delirious.  No obvious focal deficits.    Lab Results:  Data Reviewed: I have personally reviewed following labs and imaging studies  CBC: Recent Labs  Lab 10/26/19 1150 10/27/19 0545 10/28/19 0654  WBC 21.6* 17.6* 19.5*  NEUTROABS 17.9*  --   --   HGB 12.3 10.1* 10.4*  HCT 33.2* 27.3* 27.8*  MCV 85.8 84.8 83.2  PLT 267 228 017    Basic Metabolic Panel: Recent Labs  Lab 10/26/19 1150 10/26/19 1303 10/26/19 2354 10/26/19 2354 10/27/19 0241 10/27/19 0241 10/27/19 0545 10/27/19 0545 10/27/19 0957 10/27/19 1601 10/27/19 2058 10/27/19 2329 10/28/19 0654  NA 118*   < > 119*   < > 118*   < > 123*   < > 125* 129* 131* 133* 135  K 3.8   < > 3.4*  --  3.1*  --  2.8*  --  2.4*  --   --   --  2.3*  CL 86*   < > 87*  --  87*  --  88*  --  89*  --   --   --  92*  CO2 7*   < > 9*  --  9*  --  10*  --  12*  --   --   --  25  GLUCOSE 86   < > 85  --  82  --  93  --  110*  --   --   --  144*  BUN 137*   < > 127*  --  133*  --  138*  --  134*  --   --   --  119*  CREATININE 4.88*   < > 4.94*  --  4.78*  --  4.71*  --  4.58*  --   --   --  4.18*  CALCIUM 8.1*   < > 7.2*  --  7.1*  --  6.9*  --  6.6*  --   --   --  6.9*  MG 3.0*  --   --   --   --   --  2.3  --   --   --   --   --  2.3  PHOS  --   --   --   --   --   --  7.2*  --   --   --   --   --  4.5   < > = values in this interval not displayed.    GFR: Estimated Creatinine Clearance: 14.9 mL/min (A) (by C-G formula based on SCr of 4.18 mg/dL (H)).  Liver Function Tests: Recent Labs  Lab 10/26/19 1150 10/27/19 0545  AST 39 37  ALT 31 24  ALKPHOS 177* 144*    BILITOT 1.6* 1.8*  PROT 7.4 5.9*  ALBUMIN 2.9* 2.2*    Recent Labs  Lab 10/26/19 1150  LIPASE 24   Recent Labs  Lab 10/26/19 1150  AMMONIA 16     Recent Results (from the past 240 hour(s))  Group A Strep by PCR ([place] Only)     Status: None   Collection Time: 10/26/19  1:20 PM   Specimen: Throat; Sterile Swab  Result Value Ref Range Status   Group A Strep by PCR NOT DETECTED NOT DETECTED Final    Comment: Performed at [facility], [address] [place], [place] [address]  Urine culture     Status: Abnormal (Preliminary result)   Collection Time: 10/26/19  1:21 PM   Specimen: Urine, Random  Result Value Ref Range Status   Specimen Description   Final    URINE, RANDOM Performed at [facility], [address] [place], [place] [address]    Special Requests   Final    NONE Performed at [facility], [address], [place], [place] [address]    Culture (A)  Final    >=100,000 COLONIES/mL GRAM NEGATIVE RODS SUSCEPTIBILITIES TO FOLLOW Performed at [facility], [place] [address] [place], [place] [address]    Report Status PENDING  Incomplete  SARS Coronavirus 2 by RT PCR (hospital order, performed in [facility] hospital lab) Nasopharyngeal Nasopharyngeal Swab     Status: None   Collection Time: 10/26/19  1:40 PM   Specimen: Nasopharyngeal Swab  Result Value Ref Range Status   SARS Coronavirus 2 NEGATIVE NEGATIVE Final    Comment: (NOTE) SARS-CoV-2 target nucleic acids are NOT DETECTED.  The SARS-CoV-2 RNA is generally detectable in upper and lower respiratory specimens during the acute phase of infection. The lowest concentration of SARS-CoV-2 viral copies this assay can detect is 250 copies / mL. A negative result does not preclude SARS-CoV-2 infection and should not be used as the sole basis for treatment or other patient management decisions.  A negative result may occur with improper specimen collection / handling,  submission of specimen other than nasopharyngeal swab, presence of viral mutation(s) within the areas targeted by this assay, and inadequate number of viral copies (<250 copies / mL). A negative result must be combined with clinical observations, patient history, and epidemiological information.  Fact Sheet for Patients:   StrictlyIdeas.no  Fact Sheet for Healthcare Providers: BankingDealers.co.za  This test is not yet approved or  cleared by the [place] FDA and has been authorized for detection and/or diagnosis of SARS-CoV-2 by FDA under an Emergency Use Authorization (EUA).  This EUA will remain in effect (meaning this test can be used) for the duration of the COVID-19 declaration under Section 564(b)(1) of the Act, 21 U.S.C. section 360bbb-3(b)(1), unless the authorization is terminated or revoked sooner.  Performed at [facility], [address], [place], [place] [address]   Culture, blood (  routine x 2)     Status: None (Preliminary result)   Collection Time: 10/26/19  1:41 PM   Specimen: BLOOD  Result Value Ref Range Status   Specimen Description BLOOD RIGHT ANTECUBITAL  Final   Special Requests   Final    BOTTLES DRAWN AEROBIC AND ANAEROBIC Blood Culture adequate volume   Culture   Final    NO GROWTH 2 DAYS Performed at [facility], [address] [place], [place] [address]    Report Status PENDING  Incomplete  Culture, blood (routine x 2)     Status: None (Preliminary result)   Collection Time: 10/26/19  1:41 PM   Specimen: BLOOD  Result Value Ref Range Status   Specimen Description BLOOD BLOOD LEFT FOREARM  Final   Special Requests   Final    BOTTLES DRAWN AEROBIC AND ANAEROBIC Blood Culture adequate volume   Culture  Setup Time   Final    GRAM NEGATIVE RODS ANAEROBIC BOTTLE ONLY Organism ID to follow CRITICAL RESULT CALLED TO, READ BACK BY AND VERIFIED WITH: SCOTT HALL AT 0559 10/28/19  SDR Performed at [facility], [address] [place], [place] [address]    Culture GRAM NEGATIVE RODS  Final   Report Status PENDING  Incomplete  Blood Culture ID Panel (Reflexed)     Status: Abnormal   Collection Time: 10/26/19  1:41 PM  Result Value Ref Range Status   Enterococcus species NOT DETECTED NOT DETECTED Final   Listeria monocytogenes NOT DETECTED NOT DETECTED Final   Staphylococcus species NOT DETECTED NOT DETECTED Final   Staphylococcus aureus (BCID) NOT DETECTED NOT DETECTED Final   Streptococcus species NOT DETECTED NOT DETECTED Final   Streptococcus agalactiae NOT DETECTED NOT DETECTED Final   Streptococcus pneumoniae NOT DETECTED NOT DETECTED Final   Streptococcus pyogenes NOT DETECTED NOT DETECTED Final   Acinetobacter baumannii NOT DETECTED NOT DETECTED Final   Enterobacteriaceae species DETECTED (A) NOT DETECTED Final    Comment: Enterobacteriaceae represent a large family of gram-negative bacteria, not a single organism. CRITICAL RESULT CALLED TO, READ BACK BY AND VERIFIED WITH:  SCOTT HALL AT 0559 10/28/19 SDR    Enterobacter cloacae complex NOT DETECTED NOT DETECTED Final   Escherichia coli NOT DETECTED NOT DETECTED Final   Klebsiella oxytoca NOT DETECTED NOT DETECTED Final   Klebsiella pneumoniae DETECTED (A) NOT DETECTED Final    Comment: CRITICAL RESULT CALLED TO, READ BACK BY AND VERIFIED WITH: SCOTT HALL AT 0559 10/28/19 SDR    Proteus species NOT DETECTED NOT DETECTED Final   Serratia marcescens NOT DETECTED NOT DETECTED Final   Carbapenem resistance NOT DETECTED NOT DETECTED Final   Haemophilus influenzae NOT DETECTED NOT DETECTED Final   Neisseria meningitidis NOT DETECTED NOT DETECTED Final   Pseudomonas aeruginosa NOT DETECTED NOT DETECTED Final   Candida albicans NOT DETECTED NOT DETECTED Final   Candida glabrata NOT DETECTED NOT DETECTED Final   Candida krusei NOT DETECTED NOT DETECTED Final   Candida parapsilosis NOT DETECTED NOT  DETECTED Final   Candida tropicalis NOT DETECTED NOT DETECTED Final    Comment: Performed at [facility], [address] [place],  [address]  MRSA PCR Screening     Status: None   Collection Time: 10/27/19  2:34 AM   Specimen: Nasopharyngeal  Result Value Ref Range Status   MRSA by PCR NEGATIVE NEGATIVE Final    Comment:        The GeneXpert MRSA Assay (FDA approved for NASAL specimens only),  is one component of a comprehensive MRSA colonization surveillance program. It is not intended to diagnose MRSA infection nor to guide or monitor treatment for MRSA infections. Performed at [facility], [address], [place], [place] [address]       Radiology Studies: DG Chest 2 View  Result Date: 10/26/2019 CLINICAL DATA:  Shortness of breath, slurred speech and dizziness. EXAM: CHEST - 2 VIEW COMPARISON:  None FINDINGS: Trachea midline. Cardiomediastinal contours and hilar structures are normal. Mild increased interstitial change in the lingula and likely in RIGHT middle lobe. No lobar level consolidative change.  No sign of pleural effusion. On limited assessment skeletal structures are unremarkable. IMPRESSION: Mild increased interstitial prominence without lobar consolidation predominantly in lingula and RIGHT middle lobe lower lobe. Correlate with any history of chronic infection. Findings could also be due to mild and or developing pneumonitis would also correlate for any risk factors for aspiration. Electronically Signed   By: Zetta Bills M.D.   On: 10/26/2019 12:54   CT Head Wo Contrast  Result Date: 10/26/2019 CLINICAL DATA:  Mental status change and dysarthria. EXAM: CT HEAD WITHOUT CONTRAST TECHNIQUE: Contiguous axial images were obtained from the base of the skull through the vertex without intravenous contrast. COMPARISON:  None. FINDINGS: Brain: No evidence of acute infarction, hemorrhage, hydrocephalus, extra-axial collection or mass lesion/mass  effect. Vascular: No hyperdense vessel or unexpected calcification. Skull: Normal. Negative for fracture or focal lesion. Sinuses/Orbits: No acute finding. Other: None. IMPRESSION: No focal acute intracranial abnormality identified. Electronically Signed   By: Abelardo Diesel M.D.   On: 10/26/2019 13:06   US RENAL  Result Date: 10/26/2019 CLINICAL DATA:  Acute renal failure EXAM: RENAL / URINARY TRACT ULTRASOUND COMPLETE COMPARISON:  None. FINDINGS: Right Kidney: Renal measurements: 12.0 x 5.3 x 6.9 cm = volume: 227 mL . Echogenicity is increased. No mass or hydronephrosis visualized. Left Kidney: Renal measurements: 10.5 x 5.6 x 4.7 cm = volume: 147 mL. Echogenicity is increased. No mass or hydronephrosis visualized. Bladder: Debris is seen within the bladder. Other: None. IMPRESSION: Increased renal cortical echogenicity may reflect chronic renal disease. No hydronephrosis. Electronically Signed   By: Zerita Boers M.D.   On: 10/26/2019 17:23       LOS: 2 days   [facility] Pager on [email or web address]  10/28/2019, 9:44 AM

## 2019-10-28 NOTE — Consult Note (Signed)
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 Abuse, anemia, anorexia, asthma, stage III CKD, laxative abuse, recurrent UTI/interstitial cystitis, Raynaud's disease, ongoing tobacco abuse admitted for hyponatremia.  Goal of Therapy:  Electrolytes WNL  Plan:  Hyponatremia resolved: hypertonic saline stopped by Dr Candiss Norse  bicarb drip stopped and lactated ringers infusion started at 100 mL/hr per Dr Candiss Norse  Repeat 10 mEq IV KCl x 4   F/u electrolytes in am 7/8  Dallie Piles ,PharmD Clinical Pharmacist 10/28/2019 8:44 AM

## 2019-10-28 NOTE — Consult Note (Signed)
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 Abuse, anemia, anorexia, asthma, stage III CKD, laxative abuse, recurrent UTI/interstitial cystitis, Raynaud's disease, ongoing tobacco abuse admitted for hyponatremia.  Goal of Therapy:  Electrolytes WNL  Plan:   Continue 3% hypertonic saline per nephrology at 25 mL/hr  Sodium checks every 4 hours  Most recent sodium level m133 mmol/L (2329)  Goal of correction:  131-133 by tomorrow morning (per Dr Candiss Norse)  < 6 mmol/L increase per 4 hour interval (per protocol)  She remains on a bicarb drip at 100 mL/hr per Dr Noberto Retort, Elayne Snare ,PharmD Clinical Pharmacist 10/28/2019 1:14 AM

## 2019-10-28 NOTE — Progress Notes (Signed)
PHARMACY - PHYSICIAN COMMUNICATION CRITICAL VALUE ALERT - BLOOD CULTURE IDENTIFICATION (BCID)  Katherine Perez is an 49 y.o. female who presented to [facility] on 10/26/2019 with a chief complaint of SOB  Assessment:  Lab reports 1 of 4 bottles w/ GNR, Kleb pneumo, KPC (-)  Name of physician (or Provider) ContactedRachael Fee, NP  Current antibiotics: Unasyn  Changes to prescribed antibiotics recommended: Add Rocephin pending more data Recommendations accepted by provider  Results for orders placed or performed during the hospital encounter of 10/26/19  Blood Culture ID Panel (Reflexed) (Collected: 10/26/2019  1:41 PM)  Result Value Ref Range   Enterococcus species NOT DETECTED NOT DETECTED   Listeria monocytogenes NOT DETECTED NOT DETECTED   Staphylococcus species NOT DETECTED NOT DETECTED   Staphylococcus aureus (BCID) NOT DETECTED NOT DETECTED   Streptococcus species NOT DETECTED NOT DETECTED   Streptococcus agalactiae NOT DETECTED NOT DETECTED   Streptococcus pneumoniae NOT DETECTED NOT DETECTED   Streptococcus pyogenes NOT DETECTED NOT DETECTED   Acinetobacter baumannii NOT DETECTED NOT DETECTED   Enterobacteriaceae species DETECTED (A) NOT DETECTED   Enterobacter cloacae complex NOT DETECTED NOT DETECTED   Escherichia coli NOT DETECTED NOT DETECTED   Klebsiella oxytoca NOT DETECTED NOT DETECTED   Klebsiella pneumoniae DETECTED (A) NOT DETECTED   Proteus species NOT DETECTED NOT DETECTED   Serratia marcescens NOT DETECTED NOT DETECTED   Carbapenem resistance NOT DETECTED NOT DETECTED   Haemophilus influenzae NOT DETECTED NOT DETECTED   Neisseria meningitidis NOT DETECTED NOT DETECTED   Pseudomonas aeruginosa NOT DETECTED NOT DETECTED   Candida albicans NOT DETECTED NOT DETECTED   Candida glabrata NOT DETECTED NOT DETECTED   Candida krusei NOT DETECTED NOT DETECTED   Candida parapsilosis NOT DETECTED NOT DETECTED   Candida tropicalis NOT DETECTED NOT DETECTED    Hart Robinsons A 10/28/2019  6:04 AM

## 2019-10-29 ENCOUNTER — Inpatient Hospital Stay: Payer: BC Managed Care – PPO

## 2019-10-29 DIAGNOSIS — D72829 Elevated white blood cell count, unspecified: Secondary | ICD-10-CM

## 2019-10-29 DIAGNOSIS — R7881 Bacteremia: Secondary | ICD-10-CM

## 2019-10-29 DIAGNOSIS — N189 Chronic kidney disease, unspecified: Secondary | ICD-10-CM

## 2019-10-29 DIAGNOSIS — N390 Urinary tract infection, site not specified: Secondary | ICD-10-CM

## 2019-10-29 DIAGNOSIS — B961 Klebsiella pneumoniae [K. pneumoniae] as the cause of diseases classified elsewhere: Secondary | ICD-10-CM

## 2019-10-29 DIAGNOSIS — N179 Acute kidney failure, unspecified: Secondary | ICD-10-CM

## 2019-10-29 DIAGNOSIS — G9341 Metabolic encephalopathy: Secondary | ICD-10-CM

## 2019-10-29 DIAGNOSIS — R319 Hematuria, unspecified: Secondary | ICD-10-CM

## 2019-10-29 LAB — URINE CULTURE: Culture: >=100000 — AB

## 2019-10-29 LAB — COMPREHENSIVE METABOLIC PANEL
ALT: 18 U/L (ref 0–44)
AST: 21 U/L (ref 15–41)
Albumin: 1.9 g/dL — ABNORMAL LOW (ref 3.5–5.0)
Alkaline Phosphatase: 120 U/L (ref 38–126)
Anion gap: 17 — ABNORMAL HIGH (ref 5–15)
BUN: 96 mg/dL — ABNORMAL HIGH (ref 6–20)
CO2: 24 mmol/L (ref 22–32)
Calcium: 7.4 mg/dL — ABNORMAL LOW (ref 8.9–10.3)
Chloride: 90 mmol/L — ABNORMAL LOW (ref 98–111)
Creatinine, Ser: 3.84 mg/dL — ABNORMAL HIGH (ref 0.44–1.00)
GFR calc Af Amer: 15 mL/min — ABNORMAL LOW (ref >60)
GFR calc non Af Amer: 13 mL/min — ABNORMAL LOW (ref >60)
Glucose, Bld: 100 mg/dL — ABNORMAL HIGH (ref 70–99)
Potassium: 2.6 mmol/L — CL (ref 3.5–5.1)
Sodium: 131 mmol/L — ABNORMAL LOW (ref 135–145)
Total Bilirubin: 1.2 mg/dL (ref 0.3–1.2)
Total Protein: 5.8 g/dL — ABNORMAL LOW (ref 6.5–8.1)

## 2019-10-29 LAB — CBC
HCT: 25.8 % — ABNORMAL LOW (ref 36.0–46.0)
Hemoglobin: 9.5 g/dL — ABNORMAL LOW (ref 12.0–15.0)
MCH: 31.3 pg (ref 26.0–34.0)
MCHC: 36.8 g/dL — ABNORMAL HIGH (ref 30.0–36.0)
MCV: 84.9 fL (ref 80.0–100.0)
Platelets: 269 10*3/uL (ref 150–400)
RBC: 3.04 MIL/uL — ABNORMAL LOW (ref 3.87–5.11)
RDW: 12.1 % (ref 11.5–15.5)
WBC: 17.2 10*3/uL — ABNORMAL HIGH (ref 4.0–10.5)
nRBC: 0 % (ref 0.0–0.2)

## 2019-10-29 LAB — MAGNESIUM: Magnesium: 1.8 mg/dL (ref 1.7–2.4)

## 2019-10-29 LAB — GLUCOSE, CAPILLARY: Glucose-Capillary: 109 mg/dL — ABNORMAL HIGH (ref 70–99)

## 2019-10-29 LAB — PHOSPHORUS: Phosphorus: 3.3 mg/dL (ref 2.5–4.6)

## 2019-10-29 MED ORDER — SPIRONOLACTONE 25 MG PO TABS
25.0000 mg | ORAL_TABLET | Freq: Every day | ORAL | Status: DC
  Administered 2019-10-29 – 2019-10-31 (×3): 25 mg via ORAL
  Filled 2019-10-29 (×3): qty 1

## 2019-10-29 MED ORDER — POTASSIUM CHLORIDE 2 MEQ/ML IV SOLN
INTRAVENOUS | Status: DC
  Filled 2019-10-29 (×4): qty 1000

## 2019-10-29 MED ORDER — SODIUM CHLORIDE 0.9 % IV SOLN
500.0000 mg | Freq: Two times a day (BID) | INTRAVENOUS | Status: DC
  Administered 2019-10-29 – 2019-11-01 (×7): 500 mg via INTRAVENOUS
  Filled 2019-10-29: qty 500
  Filled 2019-10-29: qty 0.5
  Filled 2019-10-29: qty 500
  Filled 2019-10-29: qty 0.5
  Filled 2019-10-29: qty 500
  Filled 2019-10-29: qty 0.5
  Filled 2019-10-29: qty 500
  Filled 2019-10-29 (×2): qty 0.5

## 2019-10-29 MED ORDER — MAGNESIUM SULFATE IN D5W 1-5 GM/100ML-% IV SOLN
1.0000 g | Freq: Once | INTRAVENOUS | Status: AC
  Administered 2019-10-29: 1 g via INTRAVENOUS
  Filled 2019-10-29: qty 100

## 2019-10-29 NOTE — Progress Notes (Signed)
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  Abuse, anemia, anorexia, asthma, stage III CKD, laxative abuse, recurrent UTI/interstitial cystitis, Raynaud's disease, ongoing tobacco abuse presented to [facility] ED with poor appetite, decreased energy lethargy and confusion.  She was found to have severe hyponatremia, urine concerning for infection chest x-ray also concerning for infection.  She was hospitalized for further management.    Reason for Visit: Hyponatremia  Consultants: Nephrology.  Critical care medicine.  Procedures: None yet  Antibiotics: Anti-infectives (From admission, onward)   Start     Dose/Rate Route Frequency Ordered Stop   10/29/19 1000  meropenem (MERREM) 500 mg in sodium chloride 0.9 % 100 mL IVPB     Discontinue     500 mg 200 mL/hr over 30 Minutes Intravenous Every 12 hours 10/29/19 0801     10/28/19 1400  metroNIDAZOLE (FLAGYL) IVPB 500 mg  Status:  Discontinued        500 mg 100 mL/hr over 60 Minutes Intravenous Every 8 hours 10/28/19 1138 10/29/19 0755   10/28/19 0900  cefTRIAXone (ROCEPHIN) 2 g in sodium chloride 0.9 % 100 mL IVPB  Status:  Discontinued        2 g 200 mL/hr over 30 Minutes Intravenous Every 24 hours 10/28/19 0634 10/29/19 0755   10/26/19 2200  ceFEPIme (MAXIPIME) 2 g in sodium chloride 0.9 % 100 mL IVPB  Status:  Discontinued        2 g 200 mL/hr over 30 Minutes Intravenous Every 24 hours 10/26/19 1708 10/26/19 1751   10/26/19 2000   Ampicillin-Sulbactam (UNASYN) 3 g in sodium chloride 0.9 % 100 mL IVPB  Status:  Discontinued        3 g 200 mL/hr over 30 Minutes Intravenous Every 12 hours 10/26/19 1817 10/28/19 1138   10/26/19 1900  fluconazole (DIFLUCAN) IVPB 100 mg     Discontinue     100 mg 50 mL/hr over 60 Minutes Intravenous Every 24 hours 10/26/19 1757     10/26/19 1315  ceFEPIme (MAXIPIME) 1 g in sodium chloride 0.9 % 100 mL IVPB        1 g 200 mL/hr over 30 Minutes Intravenous  Once 10/26/19 1308 10/26/19 1441      Subjective/Interval History: Patient much more awake and responsive today.  Following commands.  Still quite distracted.  Mentions some abdominal pain.     ROS: Unable to do due to her encephalopathy.    Assessment/Plan:  Hyponatremia Appears to be acute. Presented with a sodium of 118. Her sodium level was 134 on 6/2.    Likely multifactorial including dehydration, alcohol abuse.  Patient was given IV fluids with no improvement in sodium level.  Nephrology was consulted.  Urine osmolality noted to be 233.  Serum osmolality 295.  Patient was subsequently started on 3% saline. Her sodium has responded.  Her 3% saline was discontinued .she was changed over to LR infusion.  Sodium level noted to be 131 today.  Discussed with nephrology who continues to follow.     Acute metabolic encephalopathy Most likely due to combination of hyponatremia, infection, acidosis, alcohol withdrawal.  Mentation seems to be improving gradually though she remains distracted and mildly encephalopathic.  CT head did not show any acute findings.  No focal neurological deficits noted.  Continue thiamine.  Continue CIWA protocol for now.    Acute on chronic kidney disease stage III/hypokalemia Creatinine was 1.4 on June 2.  Presented with a creatinine of 4.88.  Most likely due to NSAID use and dehydration.   She was started on IV fluids.  Nephrology was consulted. Creatinine gradually improving.  Down to 3.84 today.  Monitor  urine output.  Potassium level remains low which is being corrected by pharmacy.   Acidosis has also improved.  Bicarbonate infusion was discontinued. Continue to monitor labs and monitor urine output.  Renal ultrasound does not show any hydronephrosis.  Elevated anion gap metabolic acidosis Most likely due to acute kidney injury and hypovolemia.  Alcohol likely contributing as well.  She is not a diabetic.  Ketones noted in the urine however this could be due to starvation.  Metabolic acidosis has resolved.  Bicarbonate infusion was discontinued.  Klebsiella bacteremia/UTI with ESBL Klebsiella/chronic interstitial cystitis/sepsis present on admission. Patient was initially on Unasyn for aspiration pneumonia.  Subsequently on ceftriaxone and metronidazole.  Her urine culture is growing ESBL Klebsiella.  She was changed over to meropenem this morning. She was also bacteremic.  CT scan of the abdomen pelvis did not show any acute issues in the GU tract however an enlarged gallbladder was noted.  Tiny bilateral nonobstructing renal calculi were seen.  Sepsis physiology appears to be improving.  We will also request ID input.  Significantly distended gallbladder The scan.  She does have vague abdominal tenderness more so in the right upper quadrant.  Her LFTs however are unremarkable.  Bilirubin is normal.  Alkaline phosphatase is normal today.  We will get general surgery input.  Oral candidiasis On Diflucan per PCCM.  Suspected aspiration pneumonia Respiratory status seems to be stable.  Continue antibacterials.  Leukocytosis Secondary to infection as well as reactive component.  WBC slowly improving.  Normocytic anemia Likely due to acute illness.  No evidence for overt bleeding.  Hemoglobin stable for the most part.  No evidence of overt bleeding.  Chronic constipation and anorexia Bowel regimen to continue.  History of asthma Patient noted to be on inhalers.  Also noted to be on  theophylline.  Holding it for now.  Theophylline level is 17.8.  Tobacco abuse Nicotine patch  History of alcohol abuse Continue CIWA protocol.  Prolonged QTC Avoid QT prolonging medications.  Check EKG today.  Correct electrolytes.  Pressure injury Pressure Injury 10/27/19 Sacrum Stage 1 -  Intact skin with non-blanchable redness of a localized area usually over a bony prominence. (Active)  10/27/19 0820  Location: Sacrum  Location Orientation:   Staging: Stage 1 -  Intact skin with non-blanchable redness of a localized area usually over a bony prominence.  Wound Description (Comments):   Present on Admission: Yes    DVT Prophylaxis: Subcutaneous heparin Code Status: Full code Family Communication: No family at bedside. Disposition Plan:  Status is: Inpatient  Remains inpatient appropriate because:Persistent severe electrolyte disturbances and Altered mental status   Dispo:  Patient From: Home  Planned Disposition: To be determined  Expected discharge date: 11/02/19  Medically stable for discharge: No      Medications:  Scheduled: . Chlorhexidine Gluconate Cloth  6 each Topical Q0600  . famotidine  20 mg Oral Daily  . fluticasone  2 spray Each Nare BID  .  folic acid  1 mg Oral Daily  . heparin  5,000 Units Subcutaneous Q8H  . mometasone-formoterol  2 puff Inhalation BID  . multivitamin with minerals  1 tablet Oral Daily  . nicotine  21 mg Transdermal Daily  . pantoprazole  40 mg Oral Daily  . Plecanatide  3 mg Oral Q0600  . sodium chloride flush  10-40 mL Intracatheter Q12H  . sodium chloride flush  3 mL Intravenous Q12H  . tamsulosin  0.4 mg Oral Daily  . thiamine  100 mg Oral Daily   Or  . thiamine  100 mg Intravenous Daily   Continuous: . fluconazole (DIFLUCAN) IV Stopped (10/28/19 1921)  . lactated ringers with kcl 100 mL/hr at 10/29/19 0940  . meropenem (MERREM) IV 500 mg (10/29/19 0946)   IWL:NLGXQJJHERDEY **OR** acetaminophen, albuterol,  LORazepam **OR** LORazepam, ondansetron **OR** ondansetron (ZOFRAN) IV, sodium chloride flush, white petrolatum   Objective:  Vital Signs  Vitals:   10/29/19 0300 10/29/19 0400 10/29/19 0500 10/29/19 0600  BP: 127/72 133/75 124/66 128/77  Pulse: 95 91 86 93  Resp: 14 12 12 10   Temp:      TempSrc:      SpO2: 99% 100% 99% 100%  Weight:   57.1 kg   Height:        Intake/Output Summary (Last 24 hours) at 10/29/2019 1005 Last data filed at 10/29/2019 0623 Gross per 24 hour  Intake 1943.32 ml  Output 1950 ml  Net -6.68 ml   Filed Weights   10/27/19 0230 10/28/19 0155 10/29/19 0500  Weight: 57.4 kg 57.5 kg 57.1 kg    General appearance: Much more awake and alert though remains distracted.  In no distress. Resp: Normal effort at rest.  Few crackles at the bases.  No wheezing or rhonchi.  Cardio: S1-S2 is normal regular.  No S3-S4.  No rubs murmurs or bruit GI: Abdomen is soft.  Nonspecific tenderness appreciated throughout abdomen more so in the right upper quadrant.  No rebound rigidity or guarding.  Unable to ascertain Murphy sign.   Extremities: No edema.  Moving all her extremities. Neurologic: Awake alert.  Disoriented.  No focal neurological deficits.     Lab Results:  Data Reviewed: I have personally reviewed following labs and imaging studies  CBC: Recent Labs  Lab 10/26/19 1150 10/27/19 0545 10/28/19 0654 10/29/19 0421  WBC 21.6* 17.6* 19.5* 17.2*  NEUTROABS 17.9*  --   --   --   HGB 12.3 10.1* 10.4* 9.5*  HCT 33.2* 27.3* 27.8* 25.8*  MCV 85.8 84.8 83.2 84.9  PLT 267 228 231 814    Basic Metabolic Panel: Recent Labs  Lab 10/26/19 1150 10/26/19 1303 10/27/19 0241 10/27/19 0241 10/27/19 0545 10/27/19 0545 10/27/19 0957 10/27/19 0957 10/27/19 1601 10/27/19 2058 10/27/19 2329 10/28/19 0654 10/29/19 0421  NA 118*   < > 118*   < > 123*   < > 125*   < > 129* 131* 133* 135 131*  K 3.8   < > 3.1*  --  2.8*  --  2.4*  --   --   --   --  2.3* 2.6*  CL 86*    < > 87*  --  88*  --  89*  --   --   --   --  92* 90*  CO2 7*   < > 9*  --  10*  --  12*  --   --   --   --  25 24  GLUCOSE 86   < > 82  --  93  --  110*  --   --   --   --  144* 100*  BUN 137*   < > 133*  --  138*  --  134*  --   --   --   --  119* 96*  CREATININE 4.88*   < > 4.78*  --  4.71*  --  4.58*  --   --   --   --  4.18* 3.84*  CALCIUM 8.1*   < > 7.1*  --  6.9*  --  6.6*  --   --   --   --  6.9* 7.4*  MG 3.0*  --   --   --  2.3  --   --   --   --   --   --  2.3 1.8  PHOS  --   --   --   --  7.2*  --   --   --   --   --   --  4.5 3.3   < > = values in this interval not displayed.    GFR: Estimated Creatinine Clearance: 16.2 mL/min (A) (by C-G formula based on SCr of 3.84 mg/dL (H)).  Liver Function Tests: Recent Labs  Lab 10/26/19 1150 10/27/19 0545 10/29/19 0421  AST 39 37 21  ALT 31 24 18   ALKPHOS 177* 144* 120  BILITOT 1.6* 1.8* 1.2  PROT 7.4 5.9* 5.8*  ALBUMIN 2.9* 2.2* 1.9*    Recent Labs  Lab 10/26/19 1150  LIPASE 24   Recent Labs  Lab 10/26/19 1150  AMMONIA 16     Recent Results (from the past 240 hour(s))  Group A Strep by PCR ([facility] Only)     Status: None   Collection Time: 10/26/19  1:20 PM   Specimen: Throat; Sterile Swab  Result Value Ref Range Status   Group A Strep by PCR NOT DETECTED NOT DETECTED Final    Comment: Performed at [facility], [address], [place], [place] [address]  Urine culture     Status: Abnormal   Collection Time: 10/26/19  1:21 PM   Specimen: Urine, Random  Result Value Ref Range Status   Specimen Description   Final    URINE, RANDOM Performed at [facility], [address] [place], [place] [address]    Special Requests   Final    NONE Performed at [facility], [address] [place], [place] [address]    Culture (A)  Final    >=100,000 COLONIES/mL KLEBSIELLA PNEUMONIAE Confirmed Extended Spectrum Beta-Lactamase Producer (ESBL).  In bloodstream infections from ESBL organisms,  carbapenems are preferred over piperacillin/tazobactam. They are shown to have a lower risk of mortality.    Report Status 10/29/2019 FINAL  Final   Organism ID, Bacteria KLEBSIELLA PNEUMONIAE (A)  Final      Susceptibility   Klebsiella pneumoniae - MIC*    AMPICILLIN >=32 RESISTANT Resistant     CEFAZOLIN >=64 RESISTANT Resistant     CEFTRIAXONE >=64 RESISTANT Resistant     CIPROFLOXACIN >=4 RESISTANT Resistant     GENTAMICIN <=1 SENSITIVE Sensitive     IMIPENEM <=0.25 SENSITIVE Sensitive     NITROFURANTOIN 64 INTERMEDIATE Intermediate     TRIMETH/SULFA >=320 RESISTANT Resistant     AMPICILLIN/SULBACTAM >=32 RESISTANT Resistant     PIP/TAZO 16 SENSITIVE Sensitive     * >=100,000 COLONIES/mL KLEBSIELLA PNEUMONIAE  SARS Coronavirus 2  by RT PCR (hospital order, performed in [facility] hospital lab) Nasopharyngeal Nasopharyngeal Swab     Status: None   Collection Time: 10/26/19  1:40 PM   Specimen: Nasopharyngeal Swab  Result Value Ref Range Status   SARS Coronavirus 2 NEGATIVE NEGATIVE Final    Comment: (NOTE) SARS-CoV-2 target nucleic acids are NOT DETECTED.  The SARS-CoV-2 RNA is generally detectable in upper and lower respiratory specimens during the acute phase of infection. The lowest concentration of SARS-CoV-2 viral copies this assay can detect is 250 copies / mL. A negative result does not preclude SARS-CoV-2 infection and should not be used as the sole basis for treatment or other patient management decisions.  A negative result may occur with improper specimen collection / handling, submission of specimen other than nasopharyngeal swab, presence of viral mutation(s) within the areas targeted by this assay, and inadequate number of viral copies (<250 copies / mL). A negative result must be combined with clinical observations, patient history, and epidemiological information.  Fact Sheet for Patients:   StrictlyIdeas.no  Fact Sheet for  Healthcare Providers: BankingDealers.co.za  This test is not yet approved or  cleared by the [place] FDA and has been authorized for detection and/or diagnosis of SARS-CoV-2 by FDA under an Emergency Use Authorization (EUA).  This EUA will remain in effect (meaning this test can be used) for the duration of the COVID-19 declaration under Section 564(b)(1) of the Act, 21 U.S.C. section 360bbb-3(b)(1), unless the authorization is terminated or revoked sooner.  Performed at [facility], [address] [place], [place] [address]   Culture, blood (routine x 2)     Status: None (Preliminary result)   Collection Time: 10/26/19  1:41 PM   Specimen: BLOOD  Result Value Ref Range Status   Specimen Description BLOOD RIGHT ANTECUBITAL  Final   Special Requests   Final    BOTTLES DRAWN AEROBIC AND ANAEROBIC Blood Culture adequate volume   Culture  Setup Time   Final    GRAM NEGATIVE RODS AEROBIC BOTTLE ONLY Performed at [facility], [address], [place], [place] [address]    Culture GRAM NEGATIVE RODS  Final   Report Status PENDING  Incomplete  Culture, blood (routine x 2)     Status: Abnormal (Preliminary result)   Collection Time: 10/26/19  1:41 PM   Specimen: BLOOD  Result Value Ref Range Status   Specimen Description   Final    BLOOD BLOOD LEFT FOREARM Performed at [facility], [address], [place], [place] [address]    Special Requests   Final    BOTTLES DRAWN AEROBIC AND ANAEROBIC Blood Culture adequate volume Performed at [facility], [address] [place], [place] [address]    Culture  Setup Time   Final    GRAM NEGATIVE RODS IN BOTH AEROBIC AND ANAEROBIC BOTTLES CRITICAL RESULT CALLED TO, READ BACK BY AND VERIFIED WITH: [place] 10/28/19 SDR Performed at [facility], [place] [address] [place], [place] [address]    Culture KLEBSIELLA PNEUMONIAE (A)  Final   Report Status PENDING   Incomplete  Blood Culture ID Panel (Reflexed)     Status: Abnormal   Collection Time: 10/26/19  1:41 PM  Result Value Ref Range Status   Enterococcus species NOT DETECTED NOT DETECTED Final   Listeria monocytogenes NOT DETECTED NOT DETECTED Final   Staphylococcus species NOT DETECTED NOT DETECTED Final   Staphylococcus aureus (BCID) NOT DETECTED NOT DETECTED Final  Streptococcus species NOT DETECTED NOT DETECTED Final   Streptococcus agalactiae NOT DETECTED NOT DETECTED Final   Streptococcus pneumoniae NOT DETECTED NOT DETECTED Final   Streptococcus pyogenes NOT DETECTED NOT DETECTED Final   Acinetobacter baumannii NOT DETECTED NOT DETECTED Final   Enterobacteriaceae species DETECTED (A) NOT DETECTED Final    Comment: Enterobacteriaceae represent a large family of gram-negative bacteria, not a single organism. CRITICAL RESULT CALLED TO, READ BACK BY AND VERIFIED WITH:  SCOTT HALL AT 0559 10/28/19 SDR    Enterobacter cloacae complex NOT DETECTED NOT DETECTED Final   Escherichia coli NOT DETECTED NOT DETECTED Final   Klebsiella oxytoca NOT DETECTED NOT DETECTED Final   Klebsiella pneumoniae DETECTED (A) NOT DETECTED Final    Comment: CRITICAL RESULT CALLED TO, READ BACK BY AND VERIFIED WITH: SCOTT HALL AT 0559 10/28/19 SDR    Proteus species NOT DETECTED NOT DETECTED Final   Serratia marcescens NOT DETECTED NOT DETECTED Final   Carbapenem resistance NOT DETECTED NOT DETECTED Final   Haemophilus influenzae NOT DETECTED NOT DETECTED Final   Neisseria meningitidis NOT DETECTED NOT DETECTED Final   Pseudomonas aeruginosa NOT DETECTED NOT DETECTED Final   Candida albicans NOT DETECTED NOT DETECTED Final   Candida glabrata NOT DETECTED NOT DETECTED Final   Candida krusei NOT DETECTED NOT DETECTED Final   Candida parapsilosis NOT DETECTED NOT DETECTED Final   Candida tropicalis NOT DETECTED NOT DETECTED Final    Comment: Performed at [facility], [address] [place],  [place] [address]  MRSA PCR Screening     Status: None   Collection Time: 10/27/19  2:34 AM   Specimen: Nasopharyngeal  Result Value Ref Range Status   MRSA by PCR NEGATIVE NEGATIVE Final    Comment:        The GeneXpert MRSA Assay (FDA approved for NASAL specimens only), is one component of a comprehensive MRSA colonization surveillance program. It is not intended to diagnose MRSA infection nor to guide or monitor treatment for MRSA infections. Performed at [facility], [address], [place], [place] [address]       Radiology Studies: CT ABDOMEN PELVIS WO CONTRAST  Result Date: 10/28/2019 CLINICAL DATA:  Chronic constipation due to colonic inertia, pelvic floor dysfunction, acid reflux, gastric paresis, iron deficiency anemia, malabsorption, recurrent UTI and interstitial cystitis, ethanol abuse, smoker, presents with severe hyponatremia EXAM: CT ABDOMEN AND PELVIS WITHOUT CONTRAST TECHNIQUE: Multidetector CT imaging of the abdomen and pelvis was performed following the standard protocol without IV contrast. Sagittal and coronal MPR images reconstructed from axial data set. No oral contrast was administered. Exam utilized mA and/or kV adjustment based on patient size in order to minimize patient radiation dose. COMPARISON:  None FINDINGS: Lower chest: Patchy bibasilar airspace infiltrates consistent with multifocal pneumonia Hepatobiliary: Significantly distended gallbladder 6.4 x 6.5 cm in transverse dimensions and extending it least 11 cm length no definite wall thickening. Large calcified granuloma RIGHT lobe liver posterior to gallbladder. Remainder of liver unremarkable. Pancreas: Normal appearance Spleen: Normal appearance Adrenals/Urinary Tract: Adrenal thickening without focal mass. Tiny BILATERAL renal calculi. No renal mass lesion. Minimal stranding of perinephric fat planes bilaterally. Mild RIGHT renal collecting system dilatation. Ureters unremarkable. Bladder well distended,  unremarkable. Stomach/Bowel: Slightly prominent stool in rectum. Diffuse wall thickening of the colon consistent with colitis. Normal appendix. Small bowel loops less well evaluated, grossly unremarkable. Stomach underdistended, cannot exclude gastric wall thickening diffusely. Vascular/Lymphatic: Extensive atherosclerotic calcifications aorta and iliac arteries for age. Aorta normal caliber. No adenopathy.  Reproductive: Atrophic uterus.  Nonvisualization of ovaries. Other: Minimal free fluid. No free air. Tiny umbilical hernia containing fat. Musculoskeletal: Unremarkable IMPRESSION: Patchy bibasilar airspace infiltrates consistent with multifocal pneumonia. Significantly distended question hydropic gallbladder 6.4 x 6.5 x 11 cm in transverse dimensions, recommend correlation with LFTs and potentially RIGHT upper quadrant ultrasound. Diffuse wall thickening of the colon consistent with colitis; differential diagnosis includes infection and inflammatory bowel disease, ischemia considered less likely due to length of involvement. Tiny umbilical hernia containing fat. Tiny BILATERAL nonobstructing renal calculi. Questionable gastric wall thickening versus artifact related to underdistention. Aortic Atherosclerosis (ICD10-I70.0). Electronically Signed   By: Lavonia Dana M.D.   On: 10/28/2019 14:37       LOS: 3 days   [facility] Pager on [email or web address]  10/29/2019, 10:05 AM

## 2019-10-29 NOTE — Consult Note (Signed)
NAME: Katherine Perez  DOB: [birth year]  MRN: [id number]  Date/Time: 10/29/2019 6:11 PM  REQUESTING PROVIDER: Dr. Lanney Gins Subjective:  REASON FOR CONSULT: Klebsiella bacteremia ?History from chart and spoke to her husband as well- some from patient Katherine Perez is a 49 y.o. female with a complex history of interstitial cystitis, chronic constipation requiring laxative use, chronic kidney disease, hyponatremia osteomyelitis of mandible presented to the ED on 10/26/2019 with confusion and dysarthria.  As per patient and her spouse patient has had chronic GI symptoms otherwise was in her usual state of health until 5 days ago when she started having decreased oral intake of both liquids and food.  She also noted decreased energy and was progressively lethargic staying mostly in bed.   In the ED temperature of 97.7, blood pressure of 154/74, pulse rate of 80, sats of 95%, and respiratory rate of 24. Labs revealed a WBC of 21.6, hemoglobin of 12.3 and platelet of 267.  Sodium was 118, creatinine 4.88, BUN 137, potassium 3.8, chloride of 86 and CO2 of 7.  Blood cultures were sent. Osmolality was 295 and procalcitonin was 30.57 CT head no evidence of acute infarction, hemorrhage, hydrocephalus.  Ultrasound done on 10/26/2019 showed increased renal cortical echogenicity  and no hydronephrosis. She was seen by intensivist and admitted to ICU. She was seen by renal and the acute renal failure was thought to be due to volume depletion and underlying infection and NSAID use. I am seeing the patient for Klebsiella bacteremia and Klebsiella in the urine which is extended spectrum beta-lactamase producing organism.  Patient initially was on cefepime and Unasyn and now she is on meropenem.  Medical history history of mandible osteomyelitis in 2020 and was treated with ciprofloxacin and clindamycin for a few months.  Was followed by ID at [facility]. 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  Seen by neurologist who thought it was a combination of history of alcohol abuse, CKD, history of malnutrition. ESBL Klebsiella in the urine January 2020.  Past Surgical History:  Procedure Laterality Date   LEG SURGERY      Social History   Socioeconomic History   Marital status: Married    Spouse name: Not on file   Number of children: Not on file   Years of education: Not on file   Highest education level: Not on file  Occupational History   Not on file  Tobacco Use   Smoking status: Current Every Day Smoker   Smokeless tobacco: Never Used  Substance and Sexual Activity   Alcohol use: Yes    Comment: 4-5 drink per week   Drug use: Not Currently   Sexual activity: Not on file  Other Topics Concern   Not on file  Social History Narrative   Not on file   Social Determinants of Health   Financial Resource Strain:    Difficulty of Paying Living Expenses:   Food Insecurity:    Worried About [place] in the Last Year:    Arboriculturist in the Last Year:   Transportation Needs:    Film/video editor (Medical):    Lack  of Transportation (Non-Medical):   Physical Activity:    Days of Exercise per Week:    Minutes of Exercise per Session:   Stress:    Feeling of Stress :   Social Connections:    Frequency of Communication with Friends and Family:    Frequency of Social Gatherings with Friends and Family:    Attends Religious Services:    Active Member of Clubs or Organizations:    Attends Programme researcher, broadcasting/film/video:    Marital Status:   Intimate Partner Violence:    Fear of Current or Ex-Partner:    Emotionally Abused:    Physically Abused:    Sexually Abused:     History reviewed. No pertinent family history. Allergies  Allergen Reactions   Cetirizine Hives   Diazepam Other (See Comments)    Depression    Baclofen Anxiety and Other (See Comments)    AMS - "Really messed me up, caused me to drop things"    Current Facility-Administered Medications  Medication Dose Route Frequency Provider Last Rate Last Admin   acetaminophen (TYLENOL) tablet 650 mg  650 mg Oral Q6H PRN Hongalgi, Lenis Dickinson, MD       Or   acetaminophen (TYLENOL) suppository 650 mg  650 mg Rectal Q6H PRN Hongalgi, Anand D, MD       albuterol (PROVENTIL) (2.5 MG/3ML) 0.083% nebulizer solution 2.5 mg  2.5 mg Nebulization Q2H PRN Hongalgi, Lenis Dickinson, MD       Chlorhexidine Gluconate Cloth 2 % PADS 6 each  6 each Topical Q0600 Sharion Settler, NP   6 each at 10/29/19 0633   famotidine (PEPCID) tablet 20 mg  20 mg Oral Daily Dallie Piles, RPH   20 mg at 10/29/19 4540   fluconazole (DIFLUCAN) IVPB 100 mg  100 mg Intravenous Q24H Ottie Glazier, MD   Stopped at 10/28/19 1921   fluticasone (FLONASE) 50 MCG/ACT nasal spray 2 spray  2 spray Each Nare BID Modena Jansky, MD   2 spray at 98/11/91 4782   folic acid (FOLVITE) tablet 1 mg  1 mg Oral Daily Vernell Leep D, MD   1 mg at 10/29/19 0938   heparin injection 5,000 Units  5,000 Units Subcutaneous Q8H Hongalgi, Everlene Farrier D, MD   5,000 Units at 10/29/19 1737   lactated ringers 1,000 mL with potassium chloride 40 mEq infusion   Intravenous Continuous Dallie Piles, RPH 100 mL/hr at 10/29/19 1500 Rate Verify at 10/29/19 1500   meropenem (MERREM) 500 mg in sodium chloride 0.9 % 100 mL IVPB  500 mg Intravenous Q12H Dallie Piles, RPH   Stopped at 10/29/19 1016   mometasone-formoterol (DULERA) 100-5 MCG/ACT inhaler 2 puff  2 puff Inhalation BID  Modena Jansky, MD   2 puff at 10/29/19 0840   multivitamin with minerals tablet 1 tablet  1 tablet Oral Daily Modena Jansky, MD   1 tablet at 10/29/19 0936   nicotine (NICODERM CQ - dosed in mg/24 hours) patch 21 mg  21 mg Transdermal Daily Vernell Leep D, MD   21 mg at 10/29/19 0939   ondansetron (ZOFRAN) tablet 4 mg  4 mg Oral Q6H PRN Hongalgi, Lenis Dickinson, MD       Or   ondansetron (ZOFRAN) injection 4 mg  4 mg Intravenous Q6H PRN Hongalgi, Lenis Dickinson, MD       pantoprazole (PROTONIX) EC tablet 40 mg  40 mg Oral Daily Modena Jansky, MD   40 mg at 10/29/19  7564   Plecanatide TABS 3 mg  3 mg Oral Q0600 Hongalgi, Anand D, MD       sodium chloride flush (NS) 0.9 % injection 10-40 mL  10-40 mL Intracatheter Q12H Hongalgi, Anand D, MD   10 mL at 10/28/19 2200   sodium chloride flush (NS) 0.9 % injection 10-40 mL  10-40 mL Intracatheter PRN Hongalgi, Lenis Dickinson, MD       sodium chloride flush (NS) 0.9 % injection 3 mL  3 mL Intravenous Q12H Hongalgi, Lenis Dickinson, MD   3 mL at 10/28/19 2200   spironolactone (ALDACTONE) tablet 25 mg  25 mg Oral Daily Murlean Iba, MD   25 mg at 10/29/19 1218   tamsulosin (FLOMAX) capsule 0.4 mg  0.4 mg Oral Daily Vernell Leep D, MD   0.4 mg at 10/29/19 0940   thiamine tablet 100 mg  100 mg Oral Daily Vernell Leep D, MD   100 mg at 10/29/19 0940   Or   thiamine (B-1) injection 100 mg  100 mg Intravenous Daily Vernell Leep D, MD   100 mg at 10/27/19 3329   white petrolatum (VASELINE) gel   Topical PRN Bonnielee Haff, MD   1 application at 51/88/41 6606     Abtx:  Anti-infectives (From admission, onward)   Start     Dose/Rate Route Frequency Ordered Stop   10/29/19 1000  meropenem (MERREM) 500 mg in sodium chloride 0.9 % 100 mL IVPB     Discontinue     500 mg 200 mL/hr over 30 Minutes Intravenous Every 12 hours 10/29/19 0801     10/28/19 1400  metroNIDAZOLE (FLAGYL) IVPB 500 mg  Status:  Discontinued        500 mg 100 mL/hr over 60  Minutes Intravenous Every 8 hours 10/28/19 1138 10/29/19 0755   10/28/19 0900  cefTRIAXone (ROCEPHIN) 2 g in sodium chloride 0.9 % 100 mL IVPB  Status:  Discontinued        2 g 200 mL/hr over 30 Minutes Intravenous Every 24 hours 10/28/19 0634 10/29/19 0755   10/26/19 2200  ceFEPIme (MAXIPIME) 2 g in sodium chloride 0.9 % 100 mL IVPB  Status:  Discontinued        2 g 200 mL/hr over 30 Minutes Intravenous Every 24 hours 10/26/19 1708 10/26/19 1751   10/26/19 2000  Ampicillin-Sulbactam (UNASYN) 3 g in sodium chloride 0.9 % 100 mL IVPB  Status:  Discontinued        3 g 200 mL/hr over 30 Minutes Intravenous Every 12 hours 10/26/19 1817 10/28/19 1138   10/26/19 1900  fluconazole (DIFLUCAN) IVPB 100 mg     Discontinue     100 mg 50 mL/hr over 60 Minutes Intravenous Every 24 hours 10/26/19 1757     10/26/19 1315  ceFEPIme (MAXIPIME) 1 g in sodium chloride 0.9 % 100 mL IVPB        1 g 200 mL/hr over 30 Minutes Intravenous  Once 10/26/19 1308 10/26/19 1441      REVIEW OF SYSTEMS:  Const: no  fever, chills, negative weight loss Eyes: negative diplopia or visual changes, negative eye pain ENT: negative coryza, negative sore throat Resp: ++ cough, no hemoptysis, dyspnea Cards: negative for chest pain, palpitations, lower extremity edema GU:  frequency, dysuria  GI: Negative for abdominal pain, diarrhea, bleeding, has chronic constipation Skin: negative for rash and pruritus Heme: negative for easy bruising and gum/nose bleeding MS: muscle weakness Neurolo: dizziness, confusion Endocrine: negative for thyroid, diabetes Allergy/Immunology-as  above Objective:  VITALS:  BP 125/78    Pulse 98    Temp 98.3 F (36.8 C) (Oral)    Resp (!) 9    Ht 5\' 9"  (1.753 m)    Wt 57.1 kg    SpO2 94%    BMI 18.59 kg/m  PHYSICAL EXAM:  General: Alert, cooperative, no distress, still unable to recollect  Head: Normocephalic, without obvious abnormality, atraumatic. Eyes: Conjunctivae clear, anicteric sclerae.  Pupils are equal ENT Nares normal. No drainage or sinus tenderness. Lips, mucosa, and tongue normal. No Thrush Neck: Supple, symmetrical, no adenopathy, thyroid: non tender no carotid bruit and no JVD. Back: No CVA tenderness. Lungs: b/l air entry Heart: s1s2 Abdomen: Soft, non-tender,not distended. Bowel sounds normal. No masses Extremities: arthritic changes fingers Skin: No rashes or lesions. Or bruising Lymph: Cervical, supraclavicular normal. Neurologic: Grossly non-focal Pertinent Labs Lab Results CBC    Component Value Date/Time   WBC 17.2 (H) 10/29/2019 0421   RBC 3.04 (L) 10/29/2019 0421   HGB 9.5 (L) 10/29/2019 0421   HCT 25.8 (L) 10/29/2019 0421   PLT 269 10/29/2019 0421   MCV 84.9 10/29/2019 0421   MCH 31.3 10/29/2019 0421   MCHC 36.8 (H) 10/29/2019 0421   RDW 12.1 10/29/2019 0421   LYMPHSABS 0.9 10/26/2019 1150   MONOABS 1.0 10/26/2019 1150   EOSABS 0.0 10/26/2019 1150   BASOSABS 0.0 10/26/2019 1150    CMP Latest Ref Rng & Units 10/29/2019 10/28/2019 10/27/2019  Glucose 70 - 99 mg/dL 100(H) 144(H) -  BUN 6 - 20 mg/dL 96(H) 119(H) -  Creatinine 0.44 - 1.00 mg/dL 3.84(H) 4.18(H) -  Sodium 135 - 145 mmol/L 131(L) 135 133(L)  Potassium 3.5 - 5.1 mmol/L 2.6(LL) 2.3(LL) -  Chloride 98 - 111 mmol/L 90(L) 92(L) -  CO2 22 - 32 mmol/L 24 25 -  Calcium 8.9 - 10.3 mg/dL 7.4(L) 6.9(L) -  Total Protein 6.5 - 8.1 g/dL 5.8(L) - -  Total Bilirubin 0.3 - 1.2 mg/dL 1.2 - -  Alkaline Phos 38 - 126 U/L 120 - -  AST 15 - 41 U/L 21 - -  ALT 0 - 44 U/L 18 - -      Microbiology: Recent Results (from the past 240 hour(s))  Group A Strep by PCR ([facility] Only)     Status: None   Collection Time: 10/26/19  1:20 PM   Specimen: Throat; Sterile Swab  Result Value Ref Range Status   Group A Strep by PCR NOT DETECTED NOT DETECTED Final    Comment: Performed at [facility], [address], [place], [place] [address]  Urine culture     Status: Abnormal   Collection Time:  10/26/19  1:21 PM   Specimen: Urine, Random  Result Value Ref Range Status   Specimen Description   Final    URINE, RANDOM Performed at [facility], [address] [place], [place] [address]    Special Requests   Final    NONE Performed at [facility], [address] [place], [place] [address]    Culture (A)  Final    >=100,000 COLONIES/mL KLEBSIELLA PNEUMONIAE Confirmed Extended Spectrum Beta-Lactamase Producer (ESBL).  In bloodstream infections from ESBL organisms, carbapenems are preferred over piperacillin/tazobactam. They are shown to have a lower risk of mortality.    Report Status 10/29/2019 FINAL  Final   Organism ID, Bacteria KLEBSIELLA PNEUMONIAE (A)  Final      Susceptibility   Klebsiella pneumoniae - MIC*  AMPICILLIN >=32 RESISTANT Resistant     CEFAZOLIN >=64 RESISTANT Resistant     CEFTRIAXONE >=64 RESISTANT Resistant     CIPROFLOXACIN >=4 RESISTANT Resistant     GENTAMICIN <=1 SENSITIVE Sensitive     IMIPENEM <=0.25 SENSITIVE Sensitive     NITROFURANTOIN 64 INTERMEDIATE Intermediate     TRIMETH/SULFA >=320 RESISTANT Resistant     AMPICILLIN/SULBACTAM >=32 RESISTANT Resistant     PIP/TAZO 16 SENSITIVE Sensitive     * >=100,000 COLONIES/mL KLEBSIELLA PNEUMONIAE  SARS Coronavirus 2 by RT PCR (hospital order, performed in [place] hospital lab) Nasopharyngeal Nasopharyngeal Swab     Status: None   Collection Time: 10/26/19  1:40 PM   Specimen: Nasopharyngeal Swab  Result Value Ref Range Status   SARS Coronavirus 2 NEGATIVE NEGATIVE Final    Comment: (NOTE) SARS-CoV-2 target nucleic acids are NOT DETECTED.  The SARS-CoV-2 RNA is generally detectable in upper and lower respiratory specimens during the acute phase of infection. The lowest concentration of SARS-CoV-2 viral copies this assay can detect is 250 copies / mL. A negative result does not preclude SARS-CoV-2 infection and should not be used as the sole basis for treatment or  other patient management decisions.  A negative result may occur with improper specimen collection / handling, submission of specimen other than nasopharyngeal swab, presence of viral mutation(s) within the areas targeted by this assay, and inadequate number of viral copies (<250 copies / mL). A negative result must be combined with clinical observations, patient history, and epidemiological information.  Fact Sheet for Patients:   StrictlyIdeas.no  Fact Sheet for Healthcare Providers: BankingDealers.co.za  This test is not yet approved or  cleared by the [place] FDA and has been authorized for detection and/or diagnosis of SARS-CoV-2 by FDA under an Emergency Use Authorization (EUA).  This EUA will remain in effect (meaning this test can be used) for the duration of the COVID-19 declaration under Section 564(b)(1) of the Act, 21 U.S.C. section 360bbb-3(b)(1), unless the authorization is terminated or revoked sooner.  Performed at [facility], [address] [place], [place] [address]   Culture, blood (routine x 2)     Status: None (Preliminary result)   Collection Time: 10/26/19  1:41 PM   Specimen: BLOOD  Result Value Ref Range Status   Specimen Description BLOOD RIGHT ANTECUBITAL  Final   Special Requests   Final    BOTTLES DRAWN AEROBIC AND ANAEROBIC Blood Culture adequate volume   Culture  Setup Time   Final    GRAM NEGATIVE RODS AEROBIC BOTTLE ONLY Performed at [facility], [address], [place], [place] [address]    Culture GRAM NEGATIVE RODS  Final   Report Status PENDING  Incomplete  Culture, blood (routine x 2)     Status: Abnormal (Preliminary result)   Collection Time: 10/26/19  1:41 PM   Specimen: BLOOD  Result Value Ref Range Status   Specimen Description   Final    BLOOD BLOOD LEFT FOREARM Performed at [facility], [address], [place], [place] [address]    Special  Requests   Final    BOTTLES DRAWN AEROBIC AND ANAEROBIC Blood Culture adequate volume Performed at [facility], [address] [place], [place] [address]    Culture  Setup Time   Final    GRAM NEGATIVE RODS IN BOTH AEROBIC AND ANAEROBIC BOTTLES CRITICAL RESULT CALLED TO, READ BACK BY AND VERIFIED WITH: [place] 10/28/19 SDR Performed at [place]  [facility], [place] [address] [place], [place] [address]    Culture KLEBSIELLA PNEUMONIAE (A)  Final   Report Status PENDING  Incomplete  Blood Culture ID Panel (Reflexed)     Status: Abnormal   Collection Time: 10/26/19  1:41 PM  Result Value Ref Range Status   Enterococcus species NOT DETECTED NOT DETECTED Final   Listeria monocytogenes NOT DETECTED NOT DETECTED Final   Staphylococcus species NOT DETECTED NOT DETECTED Final   Staphylococcus aureus (BCID) NOT DETECTED NOT DETECTED Final   Streptococcus species NOT DETECTED NOT DETECTED Final   Streptococcus agalactiae NOT DETECTED NOT DETECTED Final   Streptococcus pneumoniae NOT DETECTED NOT DETECTED Final   Streptococcus pyogenes NOT DETECTED NOT DETECTED Final   Acinetobacter baumannii NOT DETECTED NOT DETECTED Final   Enterobacteriaceae species DETECTED (A) NOT DETECTED Final    Comment: Enterobacteriaceae represent a large family of gram-negative bacteria, not a single organism. CRITICAL RESULT CALLED TO, READ BACK BY AND VERIFIED WITH:  SCOTT HALL AT 0559 10/28/19 SDR    Enterobacter cloacae complex NOT DETECTED NOT DETECTED Final   Escherichia coli NOT DETECTED NOT DETECTED Final   Klebsiella oxytoca NOT DETECTED NOT DETECTED Final   Klebsiella pneumoniae DETECTED (A) NOT DETECTED Final    Comment: CRITICAL RESULT CALLED TO, READ BACK BY AND VERIFIED WITH: SCOTT HALL AT 0559 10/28/19 SDR    Proteus species NOT DETECTED NOT DETECTED Final   Serratia marcescens NOT DETECTED NOT DETECTED Final   Carbapenem resistance NOT DETECTED NOT DETECTED Final   Haemophilus  influenzae NOT DETECTED NOT DETECTED Final   Neisseria meningitidis NOT DETECTED NOT DETECTED Final   Pseudomonas aeruginosa NOT DETECTED NOT DETECTED Final   Candida albicans NOT DETECTED NOT DETECTED Final   Candida glabrata NOT DETECTED NOT DETECTED Final   Candida krusei NOT DETECTED NOT DETECTED Final   Candida parapsilosis NOT DETECTED NOT DETECTED Final   Candida tropicalis NOT DETECTED NOT DETECTED Final    Comment: Performed at [facility], [address] [place], [place] [address]  MRSA PCR Screening     Status: None   Collection Time: 10/27/19  2:34 AM   Specimen: Nasopharyngeal  Result Value Ref Range Status   MRSA by PCR NEGATIVE NEGATIVE Final    Comment:        The GeneXpert MRSA Assay (FDA approved for NASAL specimens only), is one component of a comprehensive MRSA colonization surveillance program. It is not intended to diagnose MRSA infection nor to guide or monitor treatment for MRSA infections. Performed at [facility], [address] [place], [place] [address]     IMAGING RESULTS: CT abdomen and pelvis showed adrenal thickening without focal mass Tiny bilateral renal calculi Minimal stranding of perinephric fat planes bilaterally Mild renal collecting system dilatation Slightly prominent stool in rectum Diffuse wall thickening of the colon consistent with colitis Significantly distended hydropic gallbladder 6.4 into 6.5 into 11 cm in transverse dimension  I have personally reviewed the films ? Impression/Recommendation ?   Metabolic encephalopathy secondary to hyponatremia and uremic encephalopathy.  Much improved  Klebsiella bacteremia Klebsiella urinary tract infection with possible pyelonephritis.  Patient is currently on meropenem as the Klebsiella is an ESBL. Patient is going to need at least 10- days of IV antibiotic on discharge.  Very likely that would be ertapenem.  End date will be 11/07/2019. Recommend to check a  post void bladder scan to make sure there is no incomplete bladder emptying History of interstitial cystitis  followed at [facility] History of recurrent UTI as per patient has been on multiple antibiotics in the past   Acute on chronic renal disease: Multifactorial: Seen by nephrology and he thinks the combination of infection, NSAID and volume depletion  CT scan yesterday showed significantly distended gallbladder which could be due to her not eating.  Further imaging pending  CT scan also showed thickening of the colon wall.  Suggesting colitis but patient clinically does not behave like colitis.  ___________________________________________________ Discussed with patient, and her husband and the requesting provider Note:  This document was prepared using Dragon voice recognition software and may include unintentional dictation errors.

## 2019-10-29 NOTE — Consult Note (Signed)
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 Abuse, anemia, anorexia, asthma, stage III CKD, laxative abuse, recurrent UTI/interstitial cystitis, Raynaud's disease, ongoing tobacco abuse admitted for hyponatremia. spironolactone 25 mg daily was started today  Goal of Therapy:  Electrolytes WNL  Plan:  lactated ringers infusion at 100 mL/hr per Dr Candiss Norse  Add 40 mEq/L KCl and continue at 100 mL/hr  IV magnesium sulfate 1 gram x 1  F/u electrolytes in am 7/9  Dallie Piles ,PharmD Clinical Pharmacist 10/29/2019 7:00 AM

## 2019-10-29 NOTE — Progress Notes (Signed)
CRITICAL CARE PROGRESS NOTE    Name: Jimmye Wisnieski MRN: [id number] DOB: 1970-09-10     LOS: 3   SUBJECTIVE FINDINGS & SIGNIFICANT EVENTS    Patient description:  51 female admitted with altered mental status with confusion as well as acrocyanosis, mild shortness of breath, history of anorexia and bulimia with laxative abuse.  Patient admits to malnourishment as well as active alcoholism however states this has improved over the last 10 years.  She has a history of recurrent urinary tract infections and states that she has not been without a urinary infection last 6 months.  She does not have a history of recent weight loss and reports an improvement in her weight over the last 1 month however has not been able to eat over the last 1 week.  Generally patient is Pescatarian and eats fish for protein. She had mother with breast and ovarian cancer. She is actively smoking 1.5packs daily.  She has significant oral thrush extending down past uvula on exam.   Lines/tubes : 3NS  10/29/19 - patient clinically improved, no overnight events in MICU/SDU.  Discussed case with surgery regarding abdominal pain, no surgical intervention, patient with high stool load on CT abd indicative of constipation will increase bowel regimen.   Microbiology/Sepsis markers: Results for orders placed or performed during the hospital encounter of 10/26/19  Group A Strep by PCR ([place] Only)     Status: None   Collection Time: 10/26/19  1:20 PM   Specimen: Throat; Sterile Swab  Result Value Ref Range Status   Group A Strep by PCR NOT DETECTED NOT DETECTED Final    Comment: Performed at [facility], [address], [place], [place] [address]  Urine culture     Status: Abnormal   Collection Time: 10/26/19  1:21 PM   Specimen: Urine, Random    Result Value Ref Range Status   Specimen Description   Final    URINE, RANDOM Performed at [facility], [address], [place], [place] [address]    Special Requests   Final    NONE Performed at [facility], [address] [place], [place] [address]    Culture (A)  Final    >=100,000 COLONIES/mL KLEBSIELLA PNEUMONIAE Confirmed Extended Spectrum Beta-Lactamase Producer (ESBL).  In bloodstream infections from ESBL organisms, carbapenems are preferred over piperacillin/tazobactam. They are shown to have a lower risk of mortality.    Report Status 10/29/2019 FINAL  Final   Organism ID, Bacteria KLEBSIELLA PNEUMONIAE (A)  Final      Susceptibility   Klebsiella pneumoniae - MIC*    AMPICILLIN >=32 RESISTANT Resistant     CEFAZOLIN >=64 RESISTANT Resistant     CEFTRIAXONE >=64 RESISTANT Resistant     CIPROFLOXACIN >=4 RESISTANT Resistant     GENTAMICIN <=1 SENSITIVE Sensitive     IMIPENEM <=0.25 SENSITIVE Sensitive     NITROFURANTOIN 64 INTERMEDIATE Intermediate     TRIMETH/SULFA >=320 RESISTANT Resistant     AMPICILLIN/SULBACTAM >=32 RESISTANT Resistant     PIP/TAZO 16 SENSITIVE Sensitive     * >=100,000 COLONIES/mL KLEBSIELLA PNEUMONIAE  SARS Coronavirus 2 by RT PCR (hospital order, performed in [place] hospital lab) Nasopharyngeal Nasopharyngeal Swab     Status: None   Collection Time: 10/26/19  1:40 PM   Specimen: Nasopharyngeal Swab  Result Value Ref Range Status   SARS Coronavirus 2 NEGATIVE NEGATIVE Final    Comment: (NOTE) SARS-CoV-2 target nucleic acids are NOT DETECTED.  The SARS-CoV-2 RNA  is generally detectable in upper and lower respiratory specimens during the acute phase of infection. The lowest concentration of SARS-CoV-2 viral copies this assay can detect is 250 copies / mL. A negative result does not preclude SARS-CoV-2 infection and should not be used as the sole basis for treatment or other patient management decisions.  A negative  result may occur with improper specimen collection / handling, submission of specimen other than nasopharyngeal swab, presence of viral mutation(s) within the areas targeted by this assay, and inadequate number of viral copies (<250 copies / mL). A negative result must be combined with clinical observations, patient history, and epidemiological information.  Fact Sheet for Patients:   StrictlyIdeas.no  Fact Sheet for Healthcare Providers: BankingDealers.co.za  This test is not yet approved or  cleared by the [place] FDA and has been authorized for detection and/or diagnosis of SARS-CoV-2 by FDA under an Emergency Use Authorization (EUA).  This EUA will remain in effect (meaning this test can be used) for the duration of the COVID-19 declaration under Section 564(b)(1) of the Act, 21 U.S.C. section 360bbb-3(b)(1), unless the authorization is terminated or revoked sooner.  Performed at [facility], [address] [place], [place] [address]   Culture, blood (routine x 2)     Status: None (Preliminary result)   Collection Time: 10/26/19  1:41 PM   Specimen: BLOOD  Result Value Ref Range Status   Specimen Description BLOOD RIGHT ANTECUBITAL  Final   Special Requests   Final    BOTTLES DRAWN AEROBIC AND ANAEROBIC Blood Culture adequate volume   Culture  Setup Time   Final    GRAM NEGATIVE RODS AEROBIC BOTTLE ONLY Performed at [facility], [address], [place], [place] [address]    Culture GRAM NEGATIVE RODS  Final   Report Status PENDING  Incomplete  Culture, blood (routine x 2)     Status: Abnormal (Preliminary result)   Collection Time: 10/26/19  1:41 PM   Specimen: BLOOD  Result Value Ref Range Status   Specimen Description   Final    BLOOD BLOOD LEFT FOREARM Performed at [facility], [address], [place], [place] [address]    Special Requests   Final    BOTTLES DRAWN AEROBIC AND  ANAEROBIC Blood Culture adequate volume Performed at [facility], [address] [place], [place] [address]    Culture  Setup Time   Final    GRAM NEGATIVE RODS IN BOTH AEROBIC AND ANAEROBIC BOTTLES CRITICAL RESULT CALLED TO, READ BACK BY AND VERIFIED WITH: [place] 10/28/19 SDR Performed at [facility], [place] [address] [place], [place] [address]    Culture KLEBSIELLA PNEUMONIAE (A)  Final   Report Status PENDING  Incomplete  Blood Culture ID Panel (Reflexed)     Status: Abnormal   Collection Time: 10/26/19  1:41 PM  Result Value Ref Range Status   Enterococcus species NOT DETECTED NOT DETECTED Final   Listeria monocytogenes NOT DETECTED NOT DETECTED Final   Staphylococcus species NOT DETECTED NOT DETECTED Final   Staphylococcus aureus (BCID) NOT DETECTED NOT DETECTED Final   Streptococcus species NOT DETECTED NOT DETECTED Final   Streptococcus agalactiae NOT DETECTED NOT DETECTED Final   Streptococcus pneumoniae NOT DETECTED NOT DETECTED Final   Streptococcus pyogenes NOT DETECTED NOT DETECTED Final   Acinetobacter baumannii NOT DETECTED NOT DETECTED Final   Enterobacteriaceae species DETECTED (A) NOT DETECTED Final    Comment: Enterobacteriaceae represent a large family  of gram-negative bacteria, not a single organism. CRITICAL RESULT CALLED TO, READ BACK BY AND VERIFIED WITH:  SCOTT HALL AT 0559 10/28/19 SDR    Enterobacter cloacae complex NOT DETECTED NOT DETECTED Final   Escherichia coli NOT DETECTED NOT DETECTED Final   Klebsiella oxytoca NOT DETECTED NOT DETECTED Final   Klebsiella pneumoniae DETECTED (A) NOT DETECTED Final    Comment: CRITICAL RESULT CALLED TO, READ BACK BY AND VERIFIED WITH: SCOTT HALL AT 0559 10/28/19 SDR    Proteus species NOT DETECTED NOT DETECTED Final   Serratia marcescens NOT DETECTED NOT DETECTED Final   Carbapenem resistance NOT DETECTED NOT DETECTED Final   Haemophilus influenzae NOT DETECTED NOT DETECTED Final    Neisseria meningitidis NOT DETECTED NOT DETECTED Final   Pseudomonas aeruginosa NOT DETECTED NOT DETECTED Final   Candida albicans NOT DETECTED NOT DETECTED Final   Candida glabrata NOT DETECTED NOT DETECTED Final   Candida krusei NOT DETECTED NOT DETECTED Final   Candida parapsilosis NOT DETECTED NOT DETECTED Final   Candida tropicalis NOT DETECTED NOT DETECTED Final    Comment: Performed at [facility], [address] [place], [place] [address]  MRSA PCR Screening     Status: None   Collection Time: 10/27/19  2:34 AM   Specimen: Nasopharyngeal  Result Value Ref Range Status   MRSA by PCR NEGATIVE NEGATIVE Final    Comment:        The GeneXpert MRSA Assay (FDA approved for NASAL specimens only), is one component of a comprehensive MRSA colonization surveillance program. It is not intended to diagnose MRSA infection nor to guide or monitor treatment for MRSA infections. Performed at [facility] [address] [place], [place] [address]     Anti-infectives:  Anti-infectives (From admission, onward)   Start     Dose/Rate Route Frequency Ordered Stop   10/29/19 1000  meropenem (MERREM) 500 mg in sodium chloride 0.9 % 100 mL IVPB     Discontinue     500 mg 200 mL/hr over 30 Minutes Intravenous Every 12 hours 10/29/19 0801     10/28/19 1400  metroNIDAZOLE (FLAGYL) IVPB 500 mg  Status:  Discontinued        500 mg 100 mL/hr over 60 Minutes Intravenous Every 8 hours 10/28/19 1138 10/29/19 0755   10/28/19 0900  cefTRIAXone (ROCEPHIN) 2 g in sodium chloride 0.9 % 100 mL IVPB  Status:  Discontinued        2 g 200 mL/hr over 30 Minutes Intravenous Every 24 hours 10/28/19 0634 10/29/19 0755   10/26/19 2200  ceFEPIme (MAXIPIME) 2 g in sodium chloride 0.9 % 100 mL IVPB  Status:  Discontinued        2 g 200 mL/hr over 30 Minutes Intravenous Every 24 hours 10/26/19 1708 10/26/19 1751   10/26/19 2000  Ampicillin-Sulbactam (UNASYN) 3 g in sodium chloride 0.9 % 100 mL  IVPB  Status:  Discontinued        3 g 200 mL/hr over 30 Minutes Intravenous Every 12 hours 10/26/19 1817 10/28/19 1138   10/26/19 1900  fluconazole (DIFLUCAN) IVPB 100 mg     Discontinue     100 mg 50 mL/hr over 60 Minutes Intravenous Every 24 hours 10/26/19 1757     10/26/19 1315  ceFEPIme (MAXIPIME) 1 g in sodium chloride 0.9 % 100 mL IVPB        1 g 200 mL/hr over 30 Minutes Intravenous  Once 10/26/19 1308 10/26/19 1441  PAST MEDICAL HISTORY   Past Medical History:  Diagnosis Date  . Anemia   . CKD (chronic kidney disease)   . ETOH abuse   . GERD (gastroesophageal reflux disease)   . IC (interstitial cystitis)   . Laxative abuse   . Neuropathy   . Raynaud disease      SURGICAL HISTORY   Past Surgical History:  Procedure Laterality Date  . LEG SURGERY       FAMILY HISTORY   History reviewed. No pertinent family history.   SOCIAL HISTORY   Social History   Tobacco Use  . Smoking status: Current Every Day Smoker  . Smokeless tobacco: Never Used  Substance Use Topics  . Alcohol use: Yes    Comment: 4-5 drink per week  . Drug use: Not Currently     MEDICATIONS   Current Medication:  Current Facility-Administered Medications:  .  acetaminophen (TYLENOL) tablet 650 mg, 650 mg, Oral, Q6H PRN **OR** acetaminophen (TYLENOL) suppository 650 mg, 650 mg, Rectal, Q6H PRN, Hongalgi, Anand D, MD .  albuterol (PROVENTIL) (2.5 MG/3ML) 0.083% nebulizer solution 2.5 mg, 2.5 mg, Nebulization, Q2H PRN, Hongalgi, Anand D, MD .  Chlorhexidine Gluconate Cloth 2 % PADS 6 each, 6 each, Topical, Q0600, Sharion Settler, NP, 6 each at 10/29/19 [phone] .  famotidine (PEPCID) tablet 20 mg, 20 mg, Oral, Daily, Dallie Piles, RPH, 20 mg at 10/29/19 3790 .  fluconazole (DIFLUCAN) IVPB 100 mg, 100 mg, Intravenous, Q24H, Ottie Glazier, MD, Stopped at 10/28/19 1921 .  fluticasone (FLONASE) 50 MCG/ACT nasal spray 2 spray, 2 spray, Each Nare, BID, Modena Jansky, MD, 2 spray  at 10/29/19 [phone] .  folic acid (FOLVITE) tablet 1 mg, 1 mg, Oral, Daily, Hongalgi, Anand D, MD, 1 mg at 10/29/19 0938 .  heparin injection 5,000 Units, 5,000 Units, Subcutaneous, Q8H, Modena Jansky, MD, 5,000 Units at 10/29/19 [phone] .  lactated ringers 1,000 mL with potassium chloride 40 mEq infusion, , Intravenous, Continuous, Dallie Piles, [facility], Last Rate: 100 mL/hr at 10/29/19 0940, New Bag at 10/29/19 0940 .  LORazepam (ATIVAN) tablet 1-4 mg, 1-4 mg, Oral, Q1H PRN, 1 mg at 10/26/19 2052 **OR** LORazepam (ATIVAN) injection 1-4 mg, 1-4 mg, Intravenous, Q1H PRN, Hongalgi, Anand D, MD, 4 mg at 10/27/19 1019 .  meropenem (MERREM) 500 mg in sodium chloride 0.9 % 100 mL IVPB, 500 mg, Intravenous, Q12H, Dallie Piles, RPH, Last Rate: 200 mL/hr at 10/29/19 0946, 500 mg at 10/29/19 0946 .  mometasone-formoterol (DULERA) 100-5 MCG/ACT inhaler 2 puff, 2 puff, Inhalation, BID, Hongalgi, Lenis Dickinson, MD, 2 puff at 10/29/19 0840 .  multivitamin with minerals tablet 1 tablet, 1 tablet, Oral, Daily, Hongalgi, Anand D, MD, 1 tablet at 10/29/19 0936 .  nicotine (NICODERM CQ - dosed in mg/24 hours) patch 21 mg, 21 mg, Transdermal, Daily, Hongalgi, Anand D, MD, 21 mg at 10/29/19 0939 .  ondansetron (ZOFRAN) tablet 4 mg, 4 mg, Oral, Q6H PRN **OR** ondansetron (ZOFRAN) injection 4 mg, 4 mg, Intravenous, Q6H PRN, Hongalgi, Anand D, MD .  pantoprazole (PROTONIX) EC tablet 40 mg, 40 mg, Oral, Daily, Hongalgi, Anand D, MD, 40 mg at 10/29/19 0936 .  Plecanatide TABS 3 mg, 3 mg, Oral, Q0600, Hongalgi, Anand D, MD .  sodium chloride flush (NS) 0.9 % injection 10-40 mL, 10-40 mL, Intracatheter, Q12H, Hongalgi, Anand D, MD, 10 mL at 10/28/19 2200 .  sodium chloride flush (NS) 0.9 % injection 10-40 mL, 10-40 mL, Intracatheter, PRN, Modena Jansky, MD .  sodium chloride flush (NS) 0.9 % injection 3 mL, 3 mL, Intravenous, Q12H, Hongalgi, Anand D, MD, 3 mL at 10/28/19 2200 .  tamsulosin (FLOMAX) capsule 0.4 mg, 0.4 mg, Oral,  Daily, Hongalgi, Anand D, MD, 0.4 mg at 10/29/19 0940 .  thiamine tablet 100 mg, 100 mg, Oral, Daily, 100 mg at 10/29/19 0940 **OR** thiamine (B-1) injection 100 mg, 100 mg, Intravenous, Daily, Hongalgi, Anand D, MD, 100 mg at 10/27/19 0943 .  white petrolatum (VASELINE) gel, , Topical, PRN, Bonnielee Haff, MD, 1 application at 50/53/97 0936    ALLERGIES   Cetirizine, Diazepam, and Baclofen    REVIEW OF SYSTEMS    10 point ROS done and is negative except    PHYSICAL EXAMINATION   Vital Signs: Temp:  [98.2 F (36.8 C)-98.9 F (37.2 C)] 98.2 F (36.8 C) (07/07 2000) Pulse Rate:  [50-102] 93 (07/08 0600) Resp:  [10-25] 10 (07/08 0600) BP: (115-149)/(66-88) 128/77 (07/08 0600) SpO2:  [97 %-100 %] 100 % (07/08 0600) Weight:  [57.1 kg] 57.1 kg (07/08 0500)  GENERAL:NAD mild confusion HEAD: Normocephalic, atraumatic.  EYES: Pupils equal, round, reactive to light.  No scleral icterus.  MOUTH: Moist mucosal membrane. NECK: Supple. No thyromegaly. No nodules. No JVD.  PULMONARY: CTAB CARDIOVASCULAR: S1 and S2. Regular rate and rhythm. No murmurs, rubs, or gallops.  GASTROINTESTINAL: Soft, nontender, non-distended. No masses. Positive bowel sounds. No hepatosplenomegaly. Mild LUQ tenderness MUSCULOSKELETAL: No swelling, clubbing, or edema.  NEUROLOGIC: Mild distress due to acute illness SKIN:intact,warm,dry   PERTINENT DATA     Infusions: . fluconazole (DIFLUCAN) IV Stopped (10/28/19 1921)  . lactated ringers with kcl 100 mL/hr at 10/29/19 0940  . meropenem (MERREM) IV 500 mg (10/29/19 0946)   Scheduled Medications: . Chlorhexidine Gluconate Cloth  6 each Topical Q0600  . famotidine  20 mg Oral Daily  . fluticasone  2 spray Each Nare BID  . folic acid  1 mg Oral Daily  . heparin  5,000 Units Subcutaneous Q8H  . mometasone-formoterol  2 puff Inhalation BID  . multivitamin with minerals  1 tablet Oral Daily  . nicotine  21 mg Transdermal Daily  . pantoprazole  40 mg  Oral Daily  . Plecanatide  3 mg Oral Q0600  . sodium chloride flush  10-40 mL Intracatheter Q12H  . sodium chloride flush  3 mL Intravenous Q12H  . tamsulosin  0.4 mg Oral Daily  . thiamine  100 mg Oral Daily   Or  . thiamine  100 mg Intravenous Daily   PRN Medications: acetaminophen **OR** acetaminophen, albuterol, LORazepam **OR** LORazepam, ondansetron **OR** ondansetron (ZOFRAN) IV, sodium chloride flush, white petrolatum Hemodynamic parameters:   Intake/Output: 07/07 0701 - 07/08 0700 In: 1943.3 [P.O.:480; I.V.:613.3; IV Piggyback:850] Out: 1950 [Urine:1950]  Ventilator  Settings:      LAB RESULTS:  Basic Metabolic Panel: Recent Labs  Lab 10/26/19 1150 10/26/19 1303 10/27/19 0241 10/27/19 0241 10/27/19 0545 10/27/19 0545 10/27/19 0957 10/27/19 0957 10/27/19 1601 10/27/19 2058 10/27/19 2329 10/28/19 0654 10/29/19 0421  NA 118*   < > 118*   < > 123*   < > 125*   < > 129* 131* 133* 135 131*  K 3.8   < > 3.1*   < > 2.8*   < > 2.4*   < >  --   --   --  2.3* 2.6*  CL 86*   < > 87*  --  88*  --  89*  --   --   --   --  92* 90*  CO2 7*   < > 9*  --  10*  --  12*  --   --   --   --  25 24  GLUCOSE 86   < > 82  --  93  --  110*  --   --   --   --  144* 100*  BUN 137*   < > 133*  --  138*  --  134*  --   --   --   --  119* 96*  CREATININE 4.88*   < > 4.78*  --  4.71*  --  4.58*  --   --   --   --  4.18* 3.84*  CALCIUM 8.1*   < > 7.1*  --  6.9*  --  6.6*  --   --   --   --  6.9* 7.4*  MG 3.0*  --   --   --  2.3  --   --   --   --   --   --  2.3 1.8  PHOS  --   --   --   --  7.2*  --   --   --   --   --   --  4.5 3.3   < > = values in this interval not displayed.   Liver Function Tests: Recent Labs  Lab 10/26/19 1150 10/27/19 0545 10/29/19 0421  AST 39 37 21  ALT 31 24 18   ALKPHOS 177* 144* 120  BILITOT 1.6* 1.8* 1.2  PROT 7.4 5.9* 5.8*  ALBUMIN 2.9* 2.2* 1.9*   Recent Labs  Lab 10/26/19 1150  LIPASE 24   Recent Labs  Lab 10/26/19 1150  AMMONIA 16    CBC: Recent Labs  Lab 10/26/19 1150 10/27/19 0545 10/28/19 0654 10/29/19 0421  WBC 21.6* 17.6* 19.5* 17.2*  NEUTROABS 17.9*  --   --   --   HGB 12.3 10.1* 10.4* 9.5*  HCT 33.2* 27.3* 27.8* 25.8*  MCV 85.8 84.8 83.2 84.9  PLT 267 228 231 269   Cardiac Enzymes: No results for input(s): CKTOTAL, CKMB, CKMBINDEX, TROPONINI in the last 168 hours. 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 Sagittal and coronal MPR images reconstructed from axial data set. No oral contrast was administered. Exam utilized mA and/or kV adjustment based on patient size in order to minimize patient radiation dose. COMPARISON:  None FINDINGS: Lower chest: Patchy bibasilar airspace infiltrates consistent with multifocal pneumonia Hepatobiliary: Significantly distended gallbladder 6.4 x 6.5 cm in transverse dimensions and extending it least 11 cm length no definite wall thickening. Large calcified granuloma RIGHT lobe liver posterior to gallbladder. Remainder of liver unremarkable. Pancreas: Normal appearance Spleen: Normal appearance Adrenals/Urinary Tract: Adrenal thickening without focal mass. Tiny BILATERAL renal calculi. No renal mass lesion. Minimal stranding of perinephric fat planes bilaterally. Mild RIGHT renal collecting system dilatation. Ureters unremarkable. Bladder well distended, unremarkable. Stomach/Bowel: Slightly prominent stool in  rectum. Diffuse wall thickening of the colon  consistent with colitis. Normal appendix. Small bowel loops less well evaluated, grossly unremarkable. Stomach underdistended, cannot exclude gastric wall thickening diffusely. Vascular/Lymphatic: Extensive atherosclerotic calcifications aorta and iliac arteries for age. Aorta normal caliber. No adenopathy. Reproductive: Atrophic uterus.  Nonvisualization of ovaries. Other: Minimal free fluid. No free air. Tiny umbilical hernia containing fat. Musculoskeletal: Unremarkable IMPRESSION: Patchy bibasilar airspace infiltrates consistent with multifocal pneumonia. Significantly distended question hydropic gallbladder 6.4 x 6.5 x 11 cm in transverse dimensions, recommend correlation with LFTs and potentially RIGHT upper quadrant ultrasound. Diffuse wall thickening of the colon consistent with colitis; differential diagnosis includes infection and inflammatory bowel disease, ischemia considered less likely due to length of involvement. Tiny umbilical hernia containing fat. Tiny BILATERAL nonobstructing renal calculi. Questionable gastric wall thickening versus artifact related to underdistention. Aortic Atherosclerosis (ICD10-I70.0). Electronically Signed   By: Lavonia Dana M.D.   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 Sagittal and coronal MPR images reconstructed from axial data set. No oral contrast was administered. Exam utilized mA and/or kV adjustment based on patient size in order to minimize patient  radiation dose. COMPARISON:  None FINDINGS: Lower chest: Patchy bibasilar airspace infiltrates consistent with multifocal pneumonia Hepatobiliary: Significantly distended gallbladder 6.4 x 6.5 cm in transverse dimensions and extending it least 11 cm length no definite wall thickening. Large calcified granuloma RIGHT lobe liver posterior to gallbladder. Remainder of liver unremarkable. Pancreas: Normal appearance Spleen: Normal appearance Adrenals/Urinary Tract: Adrenal thickening without focal mass. Tiny BILATERAL renal calculi. No renal mass lesion. Minimal stranding of perinephric fat planes bilaterally. Mild RIGHT renal collecting system dilatation. Ureters unremarkable. Bladder well distended, unremarkable. Stomach/Bowel: Slightly prominent stool in rectum. Diffuse wall thickening of the colon consistent with colitis. Normal appendix. Small bowel loops less well evaluated, grossly unremarkable. Stomach underdistended, cannot exclude gastric wall thickening diffusely. Vascular/Lymphatic: Extensive atherosclerotic calcifications aorta and iliac arteries for age. Aorta normal caliber. No adenopathy. Reproductive: Atrophic uterus.  Nonvisualization of ovaries. Other: Minimal free fluid. No free air. Tiny umbilical hernia containing fat. Musculoskeletal: Unremarkable IMPRESSION: Patchy bibasilar airspace infiltrates consistent with multifocal pneumonia. Significantly distended question hydropic gallbladder 6.4 x 6.5 x 11 cm in transverse dimensions, recommend correlation with LFTs and potentially RIGHT upper quadrant ultrasound. Diffuse wall thickening of the colon consistent with colitis; differential diagnosis includes infection and inflammatory bowel disease, ischemia considered less likely due to length of involvement. Tiny umbilical hernia containing fat. Tiny BILATERAL nonobstructing renal calculi. Questionable gastric wall thickening versus artifact related to underdistention. Aortic Atherosclerosis  (ICD10-I70.0). Electronically Signed   By: Lavonia Dana M.D.   On: 10/28/2019 14:37       ASSESSMENT AND PLAN    -Multidisciplinary rounds held today   Altered mental status with confusion  - due to hyponatremia with toxic metabolic encephalopathy and intercurrent UTI with interstitial nephritis -slow correction of Na per nephro-improved 10/28/19  COPD with centrilobular emphysema   -continue home Advair   - no signs of exacerbation during examination today   Sepsis with multi organ dysfunction syndrome  - present on admission - due to resistant Klebsiella bacteremia from Klebsiella UTI  - discussed with pharmD -  reviewed previous sensitivities - will narrow regimen to Rocephin and flagyl-no ESBL per previous cx  - patient is not currently requiring vasopressor support -ID on case - appreciate input   Aspiration pneumonia   - hx of EtOH   -Unasyn    -monitor signs of whithdrawal   - CIWA protocol   - thiamine/folate repletion   Oral thrush   - HCV/HIV eval negative    - patient also reports hx of vaginal yeast infection    - Diflucan 100 IV daily x5d    Severe protein calorie malnutrition   -low albumin   - bitemporal and peripheral muscle wasting.    ID -continue IV abx as prescibed -follow up cultures  GI/Nutrition GI PROPHYLAXIS as indicated DIET-->TF's as tolerated Constipation protocol as indicated  ENDO - ICU hypoglycemic\Hyperglycemia protocol -check FSBS per protocol   ELECTROLYTES -follow labs as needed -replace as needed -pharmacy consultation   DVT/GI PRX ordered -SCDs  TRANSFUSIONS AS NEEDED MONITOR FSBS ASSESS the need for LABS as needed   Critical care provider statement:    Critical care time (minutes):  34   Critical care time was exclusive of:  Separately billable procedures and treating other patients   Critical care was necessary to treat or prevent imminent or life-threatening deterioration of the following conditions:   Altered mental status, confusion, AKI, anorexia, severe protein cal malnutrition   Critical care was time spent personally by me on the following activities:  Development of treatment plan with patient or surrogate, discussions with consultants, evaluation of patient's response to treatment, examination of patient, obtaining history from patient or surrogate, ordering and performing treatments and interventions, ordering and review of laboratory studies and re-evaluation of patient's condition.  I assumed direction of critical care for this patient from another provider in my specialty: no    This document was prepared using Dragon voice recognition software and may include unintentional dictation errors.    Ottie Glazier, M.D.  Division of [place]

## 2019-10-29 NOTE — Progress Notes (Signed)
[address] [place], [place] [address] Phone [phone]. Fax [phone]  Date: 10/29/2019                  Patient Name:  Katherine Perez  MRN: [id number]  DOB: 1970-09-27  Age / Sex: 49 y.o., female         PCP: Patient, No Pcp Per                 Service Requesting Consult: IM/ Bonnielee Haff, MD                 Reason for Consult: ARF            History of Present Illness: Patient is a 49 y.o. female with medical problems of multiple medical problems, who was admitted to [facility] on 10/26/2019 for evaluation of confusion and dysarthria.   Presented for dysarthryia and word finding difficulty. Fingers noted to be purple. Also concern of Shortness of breath and confusion. H/o laxative abuse. Ongoing alcohol abuse - 4-5 drinks per husband H/o colonic intertia, chronic constipation, pelvic floor dysfuncion, gastroparesis, malnutrition  + NSAIDs- diclonofenac-misoprostol started 09/25/2019  Mental status has improved today.  Patient is able to answer many questions appropriately. She is on clear liquid diet and tolerating.   Current medications: Current Facility-Administered Medications  Medication Dose Route Frequency Provider Last Rate Last Admin  . acetaminophen (TYLENOL) tablet 650 mg  650 mg Oral Q6H PRN Hongalgi, Lenis Dickinson, MD       Or  . acetaminophen (TYLENOL) suppository 650 mg  650 mg Rectal Q6H PRN Hongalgi, Anand D, MD      . albuterol (PROVENTIL) (2.5 MG/3ML) 0.083% nebulizer solution 2.5 mg  2.5 mg Nebulization Q2H PRN Hongalgi, Lenis Dickinson, MD      . Chlorhexidine Gluconate Cloth 2 % PADS 6 each  6 each Topical Q0600 Sharion Settler, NP   6 each at 10/29/19 [phone]  . famotidine (PEPCID) tablet 20 mg  20 mg Oral Daily Dallie Piles, RPH   20 mg at 10/28/19 0950  . fluconazole (DIFLUCAN) IVPB 100 mg  100 mg Intravenous Q24H Ottie Glazier, MD   Stopped at 10/28/19 1921  . fluticasone (FLONASE) 50 MCG/ACT nasal spray 2 spray  2 spray Each Nare BID Modena Jansky, MD   2 spray at 10/28/19 2226  . folic acid (FOLVITE) tablet 1 mg  1 mg Oral Daily Hongalgi, Anand D, MD   1 mg at 10/28/19 0950  . heparin injection 5,000 Units  5,000 Units Subcutaneous Q8H Modena Jansky, MD   5,000 Units at 10/29/19 0200  . lactated ringers 1,000 mL with potassium chloride 40 mEq infusion   Intravenous Continuous Dallie Piles, RPH      . LORazepam (ATIVAN) tablet 1-4 mg  1-4 mg Oral Q1H PRN Modena Jansky, MD   1 mg at 10/26/19 2052   Or  . LORazepam (ATIVAN) injection 1-4 mg  1-4 mg Intravenous Q1H PRN Modena Jansky, MD   4 mg at 10/27/19 1019  . meropenem (MERREM) 500 mg in sodium chloride 0.9 % 100 mL IVPB  500 mg Intravenous Q12H Dallie Piles, RPH      . mometasone-formoterol (DULERA) 100-5 MCG/ACT inhaler 2 puff  2 puff Inhalation BID Hongalgi, Anand D, MD      . multivitamin with minerals tablet 1 tablet  1 tablet Oral Daily Modena Jansky, MD   1 tablet at 10/28/19 0950  . nicotine (NICODERM  CQ - dosed in mg/24 hours) patch 21 mg  21 mg Transdermal Daily Modena Jansky, MD   21 mg at 10/28/19 1002  . ondansetron (ZOFRAN) tablet 4 mg  4 mg Oral Q6H PRN Hongalgi, Lenis Dickinson, MD       Or  . ondansetron (ZOFRAN) injection 4 mg  4 mg Intravenous Q6H PRN Hongalgi, Anand D, MD      . pantoprazole (PROTONIX) EC tablet 40 mg  40 mg Oral Daily Modena Jansky, MD   40 mg at 10/28/19 0950  . Plecanatide TABS 3 mg  3 mg Oral Q0600 Hongalgi, Anand D, MD      . sodium chloride flush (NS) 0.9 % injection 10-40 mL  10-40 mL Intracatheter Q12H Hongalgi, Lenis Dickinson, MD   10 mL at 10/28/19 2200  . sodium chloride flush (NS) 0.9 % injection 10-40 mL  10-40 mL Intracatheter PRN Hongalgi, Anand D, MD      . sodium chloride flush (NS) 0.9 % injection 3 mL  3 mL Intravenous Q12H Hongalgi, Lenis Dickinson, MD   3 mL at 10/28/19 2200  . tamsulosin (FLOMAX) capsule 0.4 mg  0.4 mg Oral Daily Vernell Leep D, MD   0.4 mg at 10/28/19 0950  . thiamine tablet 100 mg  100 mg Oral  Daily Vernell Leep D, MD   100 mg at 10/28/19 0950   Or  . thiamine (B-1) injection 100 mg  100 mg Intravenous Daily Modena Jansky, MD   100 mg at 10/27/19 0943  . white petrolatum (VASELINE) gel   Topical PRN Bonnielee Haff, MD   1 application at 69/48/54 0359      Vital Signs: Blood pressure 128/77, pulse 93, temperature 98.2 F (36.8 C), temperature source Oral, resp. rate 10, height 5\' 9"  (1.753 m), weight 57.1 kg, SpO2 100 %.   Intake/Output Summary (Last 24 hours) at 10/29/2019 0934 Last data filed at 10/29/2019 6270 Gross per 24 hour  Intake 1943.32 ml  Output 1950 ml  Net -6.68 ml    Weight trends: Filed Weights   10/27/19 0230 10/28/19 0155 10/29/19 0500  Weight: 57.4 kg 57.5 kg 57.1 kg    Physical Exam: General:  NAD  HEENT  moist oral mucus membranes  Neck:  supple, no JVD  Lungs: Coarse breath sounds b/l, room air  Heart::  regular  Abdomen: Soft, nontender  Extremities:  no edema  Neurologic:  Alert, oriented, able to follow commands  Skin: No acute rashes,    Lab results: Basic Metabolic Panel: Recent Labs  Lab 10/27/19 0545 10/27/19 0545 10/27/19 0957 10/27/19 1601 10/27/19 2329 10/28/19 0654 10/29/19 0421  NA 123*   < > 125*   < > 133* 135 131*  K 2.8*   < > 2.4*  --   --  2.3* 2.6*  CL 88*   < > 89*  --   --  92* 90*  CO2 10*   < > 12*  --   --  25 24  GLUCOSE 93   < > 110*  --   --  144* 100*  BUN 138*   < > 134*  --   --  119* 96*  CREATININE 4.71*   < > 4.58*  --   --  4.18* 3.84*  CALCIUM 6.9*   < > 6.6*  --   --  6.9* 7.4*  MG 2.3  --   --   --   --  2.3 1.8  PHOS  7.2*  --   --   --   --  4.5 3.3   < > = values in this interval not displayed.    Liver Function Tests: Recent Labs  Lab 10/29/19 0421  AST 21  ALT 18  ALKPHOS 120  BILITOT 1.2  PROT 5.8*  ALBUMIN 1.9*   Recent Labs  Lab 10/26/19 1150  LIPASE 24   Recent Labs  Lab 10/26/19 1150  AMMONIA 16    CBC: Recent Labs  Lab 10/26/19 1150 10/27/19 0545  10/28/19 0654 10/29/19 0421  WBC 21.6*   < > 19.5* 17.2*  NEUTROABS 17.9*  --   --   --   HGB 12.3   < > 10.4* 9.5*  HCT 33.2*   < > 27.8* 25.8*  MCV 85.8   < > 83.2 84.9  PLT 267   < > 231 269   < > = values in this interval not displayed.    Cardiac Enzymes: No results for input(s): CKTOTAL, TROPONINI in the last 168 hours.  BNP: Invalid input(s): POCBNP  CBG: Recent Labs  Lab 10/28/19 1955 10/29/19 0355  GLUCAP 135* 109*    Microbiology: Recent Results (from the past 720 hour(s))  Group A Strep by PCR ([place] Only)     Status: None   Collection Time: 10/26/19  1:20 PM   Specimen: Throat; Sterile Swab  Result Value Ref Range Status   Group A Strep by PCR NOT DETECTED NOT DETECTED Final    Comment: Performed at [facility], [address], [place], [place] [address]  Urine culture     Status: Abnormal   Collection Time: 10/26/19  1:21 PM   Specimen: Urine, Random  Result Value Ref Range Status   Specimen Description   Final    URINE, RANDOM Performed at [facility], [address], [place], [place] [address]    Special Requests   Final    NONE Performed at [facility], [address] [place],  [address]    Culture (A)  Final    >=100,000 COLONIES/mL KLEBSIELLA PNEUMONIAE Confirmed Extended Spectrum Beta-Lactamase Producer (ESBL).  In bloodstream infections from ESBL organisms, carbapenems are preferred over piperacillin/tazobactam. They are shown to have a lower risk of mortality.    Report Status 10/29/2019 FINAL  Final   Organism ID, Bacteria KLEBSIELLA PNEUMONIAE (A)  Final      Susceptibility   Klebsiella pneumoniae - MIC*    AMPICILLIN >=32 RESISTANT Resistant     CEFAZOLIN >=64 RESISTANT Resistant     CEFTRIAXONE >=64 RESISTANT Resistant     CIPROFLOXACIN >=4 RESISTANT Resistant     GENTAMICIN <=1 SENSITIVE Sensitive     IMIPENEM <=0.25 SENSITIVE Sensitive     NITROFURANTOIN 64 INTERMEDIATE Intermediate      TRIMETH/SULFA >=320 RESISTANT Resistant     AMPICILLIN/SULBACTAM >=32 RESISTANT Resistant     PIP/TAZO 16 SENSITIVE Sensitive     * >=100,000 COLONIES/mL KLEBSIELLA PNEUMONIAE  SARS Coronavirus 2 by RT PCR (hospital order, performed in [place] hospital lab) Nasopharyngeal Nasopharyngeal Swab     Status: None   Collection Time: 10/26/19  1:40 PM   Specimen: Nasopharyngeal Swab  Result Value Ref Range Status   SARS Coronavirus 2 NEGATIVE NEGATIVE Final    Comment: (NOTE) SARS-CoV-2 target nucleic acids are NOT DETECTED.  The SARS-CoV-2 RNA is generally detectable in upper and lower respiratory specimens during the acute phase of infection. The lowest concentration of SARS-CoV-2 viral copies this assay  can detect is 250 copies / mL. A negative result does not preclude SARS-CoV-2 infection and should not be used as the sole basis for treatment or other patient management decisions.  A negative result may occur with improper specimen collection / handling, submission of specimen other than nasopharyngeal swab, presence of viral mutation(s) within the areas targeted by this assay, and inadequate number of viral copies (<250 copies / mL). A negative result must be combined with clinical observations, patient history, and epidemiological information.  Fact Sheet for Patients:   StrictlyIdeas.no  Fact Sheet for Healthcare Providers: BankingDealers.co.za  This test is not yet approved or  cleared by the [place] FDA and has been authorized for detection and/or diagnosis of SARS-CoV-2 by FDA under an Emergency Use Authorization (EUA).  This EUA will remain in effect (meaning this test can be used) for the duration of the COVID-19 declaration under Section 564(b)(1) of the Act, 21 U.S.C. section 360bbb-3(b)(1), unless the authorization is terminated or revoked sooner.  Performed at [facility], [address]  [place], [place] [address]   Culture, blood (routine x 2)     Status: None (Preliminary result)   Collection Time: 10/26/19  1:41 PM   Specimen: BLOOD  Result Value Ref Range Status   Specimen Description BLOOD RIGHT ANTECUBITAL  Final   Special Requests   Final    BOTTLES DRAWN AEROBIC AND ANAEROBIC Blood Culture adequate volume   Culture  Setup Time   Final    GRAM NEGATIVE RODS AEROBIC BOTTLE ONLY Performed at [facility], [address], [place], [place] [address]    Culture GRAM NEGATIVE RODS  Final   Report Status PENDING  Incomplete  Culture, blood (routine x 2)     Status: Abnormal (Preliminary result)   Collection Time: 10/26/19  1:41 PM   Specimen: BLOOD  Result Value Ref Range Status   Specimen Description   Final    BLOOD BLOOD LEFT FOREARM Performed at [facility], [address], [place], [place] [address]    Special Requests   Final    BOTTLES DRAWN AEROBIC AND ANAEROBIC Blood Culture adequate volume Performed at [facility], [address] [place], [place] [address]    Culture  Setup Time   Final    GRAM NEGATIVE RODS IN BOTH AEROBIC AND ANAEROBIC BOTTLES CRITICAL RESULT CALLED TO, READ BACK BY AND VERIFIED WITH: [place] 10/28/19 SDR Performed at [facility], [place] [address], [place], [place] [address]    Culture KLEBSIELLA PNEUMONIAE (A)  Final   Report Status PENDING  Incomplete  Blood Culture ID Panel (Reflexed)     Status: Abnormal   Collection Time: 10/26/19  1:41 PM  Result Value Ref Range Status   Enterococcus species NOT DETECTED NOT DETECTED Final   Listeria monocytogenes NOT DETECTED NOT DETECTED Final   Staphylococcus species NOT DETECTED NOT DETECTED Final   Staphylococcus aureus (BCID) NOT DETECTED NOT DETECTED Final   Streptococcus species NOT DETECTED NOT DETECTED Final   Streptococcus agalactiae NOT DETECTED NOT DETECTED Final   Streptococcus pneumoniae NOT DETECTED NOT DETECTED Final    Streptococcus pyogenes NOT DETECTED NOT DETECTED Final   Acinetobacter baumannii NOT DETECTED NOT DETECTED Final   Enterobacteriaceae species DETECTED (A) NOT DETECTED Final    Comment: Enterobacteriaceae represent a large family of gram-negative bacteria, not a single organism. CRITICAL RESULT CALLED TO, READ BACK BY AND VERIFIED WITH:  SCOTT HALL AT 0559 10/28/19 SDR  Enterobacter cloacae complex NOT DETECTED NOT DETECTED Final   Escherichia coli NOT DETECTED NOT DETECTED Final   Klebsiella oxytoca NOT DETECTED NOT DETECTED Final   Klebsiella pneumoniae DETECTED (A) NOT DETECTED Final    Comment: CRITICAL RESULT CALLED TO, READ BACK BY AND VERIFIED WITH: SCOTT HALL AT 0559 10/28/19 SDR    Proteus species NOT DETECTED NOT DETECTED Final   Serratia marcescens NOT DETECTED NOT DETECTED Final   Carbapenem resistance NOT DETECTED NOT DETECTED Final   Haemophilus influenzae NOT DETECTED NOT DETECTED Final   Neisseria meningitidis NOT DETECTED NOT DETECTED Final   Pseudomonas aeruginosa NOT DETECTED NOT DETECTED Final   Candida albicans NOT DETECTED NOT DETECTED Final   Candida glabrata NOT DETECTED NOT DETECTED Final   Candida krusei NOT DETECTED NOT DETECTED Final   Candida parapsilosis NOT DETECTED NOT DETECTED Final   Candida tropicalis NOT DETECTED NOT DETECTED Final    Comment: Performed at [facility], [address] [place], [place] [address]  MRSA PCR Screening     Status: None   Collection Time: 10/27/19  2:34 AM   Specimen: Nasopharyngeal  Result Value Ref Range Status   MRSA by PCR NEGATIVE NEGATIVE Final    Comment:        The GeneXpert MRSA Assay (FDA approved for NASAL specimens only), is one component of a comprehensive MRSA colonization surveillance program. It is not intended to diagnose MRSA infection nor to guide or monitor treatment for MRSA infections. Performed at [facility], [address] [place], [place] [address]       Coagulation Studies: No results for input(s): LABPROT, INR in the last 72 hours.  Urinalysis: Recent Labs    10/26/19 1231  COLORURINE BIOCHEMICALS MAY BE AFFECTED BY COLOR*  LABSPEC 1.008  PHURINE 5.0  GLUCOSEU NEGATIVE  HGBUR MODERATE*  BILIRUBINUR NEGATIVE  KETONESUR 5*  PROTEINUR 30*  NITRITE POSITIVE*  LEUKOCYTESUR LARGE*        Imaging: CT ABDOMEN PELVIS WO CONTRAST  Result Date: 10/28/2019 CLINICAL DATA:  Chronic constipation due to colonic inertia, pelvic floor dysfunction, acid reflux, gastric paresis, iron deficiency anemia, malabsorption, recurrent UTI and interstitial cystitis, ethanol abuse, smoker, presents with severe hyponatremia EXAM: CT ABDOMEN AND PELVIS WITHOUT CONTRAST TECHNIQUE: Multidetector CT imaging of the abdomen and pelvis was performed following the standard protocol without IV contrast. Sagittal and coronal MPR images reconstructed from axial data set. No oral contrast was administered. Exam utilized mA and/or kV adjustment based on patient size in order to minimize patient radiation dose. COMPARISON:  None FINDINGS: Lower chest: Patchy bibasilar airspace infiltrates consistent with multifocal pneumonia Hepatobiliary: Significantly distended gallbladder 6.4 x 6.5 cm in transverse dimensions and extending it least 11 cm length no definite wall thickening. Large calcified granuloma RIGHT lobe liver posterior to gallbladder. Remainder of liver unremarkable. Pancreas: Normal appearance Spleen: Normal appearance Adrenals/Urinary Tract: Adrenal thickening without focal mass. Tiny BILATERAL renal calculi. No renal mass lesion. Minimal stranding of perinephric fat planes bilaterally. Mild RIGHT renal collecting system dilatation. Ureters unremarkable. Bladder well distended, unremarkable. Stomach/Bowel: Slightly prominent stool in rectum. Diffuse wall thickening of the colon consistent with colitis. Normal appendix. Small bowel loops less well evaluated, grossly  unremarkable. Stomach underdistended, cannot exclude gastric wall thickening diffusely. Vascular/Lymphatic: Extensive atherosclerotic calcifications aorta and iliac arteries for age. Aorta normal caliber. No adenopathy. Reproductive: Atrophic uterus.  Nonvisualization of ovaries. Other: Minimal free fluid. No free air. Tiny umbilical hernia containing fat. Musculoskeletal: Unremarkable IMPRESSION: Patchy bibasilar airspace infiltrates  consistent with multifocal pneumonia. Significantly distended question hydropic gallbladder 6.4 x 6.5 x 11 cm in transverse dimensions, recommend correlation with LFTs and potentially RIGHT upper quadrant ultrasound. Diffuse wall thickening of the colon consistent with colitis; differential diagnosis includes infection and inflammatory bowel disease, ischemia considered less likely due to length of involvement. Tiny umbilical hernia containing fat. Tiny BILATERAL nonobstructing renal calculi. Questionable gastric wall thickening versus artifact related to underdistention. Aortic Atherosclerosis (ICD10-I70.0). Electronically Signed   By: Lavonia Dana M.D.   On: 10/28/2019 14:37     Assessment & Plan: Pt is a 49 y.o. 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, pelvic floor dysfunction, acid reflux, gastroparesis, iron deficiency anemia, malnutrition, alcohol abuse, tobacco abuse, anemia, asthma, chronic kidney disease, laxative abuse, recurrent UTIs, interstitial cystitis, Raynaud's disease, was admitted on 10/26/2019 with Dehydration [E86.0] Hyponatremia [E87.1] ARF (acute renal failure) (HCC) [N17.9] AKI (acute kidney injury) ([place]) [N17.9] Urinary tract infection with hematuria, site unspecified [N39.0, R31.9]   # ARF # Hyponatremia # Substance abuse - alcohol # heavy smoker 1.5 ppd # severe acidosis # pyuria (chronic interstitial cystitis) #Sepsis-Klebsiella -from urinary source-multidrug-resistant #Severe hypokalemia  ARF is likely  multifactorial from volume depletion, sepsis, and NSAIDs leading to ATN No recent iv contrast exposure -Baseline Cr 1.40/ GFR 45 from 09/23/2019 -US renal: Debris seen within bladder.  Increased renal cortical echogenicity.  No hydronephrosis - gentle volume repletion -Hold NSAIDs, avoid hypotension  Lab Results  Component Value Date   CREATININE 3.84 (H) 10/29/2019   CREATININE 4.18 (H) 10/28/2019   CREATININE 4.58 (H) 10/27/2019   Serum creatinine trend is improving slowly UOP ~ 2000 cc Electrolytes and volume status are acceptable.  No acute indication for dialysis.  Hyponatremia is multifactorial with contribution from volume depletion, alcohol abuse. Other ddx includes SIADH -Sodium has improved slowly with 3% saline given at a low rate of 25 cc/h.  It was discontinued after sodium improved to 131 -Sodium level today at 131 Patient's reports she drinks excessive amount of water at home ?psychogenic polydypsia Alcohol withdrawal precautions as per ICU team Possible aspiration pneumonia treatment as per ICU Fluid restriction to 1500 cc /d Sepsis With Enterobacter species and Klebsiella detected in the blood Greater than 100,000 Klebsiella in the urine Sepsis is likely from urinary source CT of the abdomen shows patchy bibasilar infiltrate consistent with multifocal pneumonia.  Significantly distended gallbladder.  Thickened colon wall consistent with colitis  Hypokalemia Replace IV as needed Add potassium sparing diuretic- spironolactone    LOS: 3 Evanthia Maund 7/8/20219:34 AM    Note: This note was prepared with Dragon dictation. Any transcription errors are unintentional

## 2019-10-29 NOTE — Progress Notes (Signed)
SURGICAL CONSULTATION NOTE   HISTORY OF PRESENT ILLNESS (HPI):  49 y.o. female presented to [facility] ED for evaluation of poor appetite, decreased energy and lethargy, dry cough and confusion since 5 days ago.  Patient was admitted to the hospital with diagnosis of hyponatremia, high anion gap metabolic acidosis, acute kidney injury, dehydrated, suspected aspiration pneumonia and UTI.  Patient reported having episodic pain on the left upper quadrant.  Patient reported the pain on the left upper quadrant does not radiate to other part of body.  Patient cannot identify any alleviating or aggravating factor.  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  She denies pain on the right upper quadrant.  Upon full evaluation since admission CT scan of the abdominal pelvis was done showing distended the bladder.  Ultrasound of the abdomen was done showing distending the bladder with sludge but no sign of cholecystitis.  Patient also had bacteremia with possible pneumonia.  Urine cultures grew Klebsiella pneumonia.  I personally evaluated the CT scan of the abdominal pelvis.  Surgery is consulted by Dr. Maryland Pink in this context for evaluation and management of distended bladder.  PAST MEDICAL HISTORY (PMH):  Past Medical History:  Diagnosis Date  . Anemia   . CKD (chronic kidney disease)   . ETOH abuse   . GERD (gastroesophageal reflux disease)   . IC (interstitial cystitis)   . Laxative abuse   . Neuropathy   . Raynaud disease      PAST SURGICAL HISTORY ([place]):  Past Surgical History:  Procedure Laterality Date  . LEG SURGERY       MEDICATIONS:  Prior to Admission medications   Medication Sig Start Date End Date Taking? Authorizing Provider  Calcium Polycarbophil (FIBER) 625 MG TABS Take 2 capsules by mouth 2 (two) times daily.   Yes [provider]  Diclofenac-miSOPROStol 75-0.2 MG TBEC Take 1 tablet by mouth in the morning, at  noon, and at bedtime.  09/25/19  Yes [provider]  famotidine (PEPCID) 40 MG tablet Take 40 mg by mouth daily. 10/07/19  Yes [provider]  fluticasone (FLONASE) 50 MCG/ACT nasal spray Place 2 sprays into both nostrils 2 (two) times daily.  09/14/19  Yes [provider]  Fluticasone-Salmeterol (ADVAIR) 100-50 MCG/DOSE AEPB Inhale 1 puff into the lungs 2 (two) times daily. 09/14/19  Yes [provider]  Fluticasone-Salmeterol (ADVAIR) 250-50 MCG/DOSE AEPB Inhale 1 puff into the lungs 2 (two) times daily. 09/14/19  Yes [provider]  gabapentin (NEURONTIN) 300 MG capsule Take 300 mg by mouth 5 (five) times daily as needed. 10/07/19  Yes [provider]  metoCLOPramide (REGLAN) 10 MG tablet Take 20 mg by mouth in the morning and at bedtime.  10/17/19  Yes [provider]  ondansetron (ZOFRAN) 8 MG tablet Take 16 mg by mouth 2 (two) times daily.  10/23/19  Yes [provider]  promethazine (PHENERGAN) 25 MG tablet Take 25 mg by mouth every 8 (eight) hours as needed for nausea or vomiting.  10/23/19  Yes [provider]  RABEprazole (ACIPHEX) 20 MG tablet Take 20 mg by mouth in the morning and at bedtime.  10/12/19  Yes [provider]  tamsulosin (FLOMAX) 0.4 MG CAPS capsule Take 0.4 mg by mouth daily. 10/10/19  Yes [provider]  theophylline (UNIPHYL) 400 MG 24 hr tablet Take 400 mg by mouth 2 (two) times daily.  10/10/19  Yes [provider]  trimethoprim (  TRIMPEX) 100 MG tablet Take 100 mg by mouth daily. 10/13/19  Yes [provider]  TRULANCE 3 MG TABS Take 3 mg by mouth daily at 6 (six) AM.  10/12/19  Yes [provider]  albuterol (VENTOLIN HFA) 108 (90 Base) MCG/ACT inhaler Inhale 2 puffs into the lungs every 4 (four) hours as needed. 06/07/19   [provider]  fexofenadine (ALLEGRA) 180 MG tablet Take 180 mg by mouth daily as needed for allergies.    [provider]  psyllium (FIBER LAXATIVE) 0.52 g capsule Take 2 capsules by mouth 2 (two) times daily. Patient not taking: Reported on 10/26/2019    [provider]  traMADol (ULTRAM) 50 MG tablet Take 50 mg by mouth 3 (three) times daily as needed. 10/03/19   [provider]     ALLERGIES:  Allergies  Allergen Reactions  . Cetirizine Hives  . Diazepam Other (See Comments)    Depression   . Baclofen Anxiety and Other (See Comments)    AMS - "Really messed me up, caused me to drop things"      SOCIAL HISTORY:  Social History   Socioeconomic History  . Marital status: Married    Spouse name: Not on file  . Number of children: Not on file  . Years of education: Not on file  . Highest education level: Not on file  Occupational History  . Not on file  Tobacco Use  . Smoking status: Current Every Day Smoker  . Smokeless tobacco: Never Used  Substance and Sexual Activity  . Alcohol use: Yes    Comment: 4-5 drink per week  . Drug use: Not Currently  . Sexual activity: Not on file  Other Topics Concern  . Not on file  Social History Narrative  . Not on file   Social Determinants of Health   Financial Resource Strain:   . Difficulty of Paying Living Expenses:   Food Insecurity:   . Worried About Charity fundraiser in the Last Year:   . Arboriculturist in the Last Year:   Transportation Needs:   . Film/video editor (Medical):   Marland Kitchen Lack of Transportation (Non-Medical):   Physical Activity:   . Days of Exercise per Week:   . Minutes of Exercise per Session:   Stress:   . Feeling of Stress :   Social Connections:   . Frequency of Communication with Friends and Family:   . Frequency of Social Gatherings with Friends and Family:   . Attends Religious Services:   . Active Member of Clubs or Organizations:   . Attends Archivist Meetings:   Marland Kitchen Marital Status:   Intimate Partner Violence:   . Fear of Current or Ex-Partner:   . Emotionally  Abused:   Marland Kitchen Physically Abused:   . Sexually Abused:       FAMILY HISTORY:  History reviewed. No pertinent family history.   REVIEW OF SYSTEMS:  Constitutional: denies weight loss, fever, chills, or sweats.  Positive for lethargy Eyes: denies any other vision changes, history of eye injury  ENT: denies sore throat, hearing problems  Respiratory: denies shortness of breath, wheezing.  Positive for coughing Cardiovascular: denies chest pain, palpitations  Gastrointestinal: Positive abdominal pain, nausea and vomiting Genitourinary: Positive burning with urination  Musculoskeletal: denies any other joint pains or cramps  Skin: denies any other rashes or skin discolorations  Neurological: denies any other headache, dizziness, weakness  Psychiatric: denies any other depression, positive  for anxiety   All other review of systems were negative   VITAL SIGNS:  Temp:  [98.3 F (36.8 C)-98.5 F (36.9 C)] 98.5 F (36.9 C) (07/08 1600) Pulse Rate:  [82-105] 101 (07/08 1800) Resp:  [9-27] 16 (07/08 1800) BP: (104-148)/(66-85) 135/85 (07/08 1800) SpO2:  [93 %-100 %] 93 % (07/08 1800) Weight:  [57.1 kg] 57.1 kg (07/08 0500)     Height: 5\' 9"  (175.3 cm) Weight: 57.1 kg BMI (Calculated): 18.58   INTAKE/OUTPUT:  This shift: No intake/output data recorded.  Last 2 shifts: @IOLAST2SHIFTS @   PHYSICAL EXAM:  Constitutional:  -- Normal body habitus  -- Awake, alert, and oriented x3  Eyes:  -- Pupils equally round and reactive to light  -- No scleral icterus  Ear, nose, and throat:  -- No jugular venous distension  Pulmonary:  -- No crackles  -- Equal breath sounds bilaterally -- Breathing non-labored at rest Cardiovascular:  -- S1, S2 present  -- No pericardial rubs Gastrointestinal:  -- Abdomen soft, nontender, non-distended, no guarding or rebound tenderness -- No abdominal masses appreciated, pulsatile or otherwise  Musculoskeletal and Integumentary:  -- Wounds: None  appreciated -- Extremities: B/L UE and LE FROM, hands and feet warm, no edema  Neurologic:  -- Motor function: intact and symmetric -- Sensation: intact and symmetric   Labs:  CBC Latest Ref Rng & Units 10/29/2019 10/28/2019 10/27/2019  WBC 4.0 - 10.5 K/uL 17.2(H) 19.5(H) 17.6(H)  Hemoglobin 12.0 - 15.0 g/dL 9.5(L) 10.4(L) 10.1(L)  Hematocrit 36 - 46 % 25.8(L) 27.8(L) 27.3(L)  Platelets 150 - 400 K/uL 269 231 228   CMP Latest Ref Rng & Units 10/29/2019 10/28/2019 10/27/2019  Glucose 70 - 99 mg/dL 100(H) 144(H) -  BUN 6 - 20 mg/dL 96(H) 119(H) -  Creatinine 0.44 - 1.00 mg/dL 3.84(H) 4.18(H) -  Sodium 135 - 145 mmol/L 131(L) 135 133(L)  Potassium 3.5 - 5.1 mmol/L 2.6(LL) 2.3(LL) -  Chloride 98 - 111 mmol/L 90(L) 92(L) -  CO2 22 - 32 mmol/L 24 25 -  Calcium 8.9 - 10.3 mg/dL 7.4(L) 6.9(L) -  Total Protein 6.5 - 8.1 g/dL 5.8(L) - -  Total Bilirubin 0.3 - 1.2 mg/dL 1.2 - -  Alkaline Phos 38 - 126 U/L 120 - -  AST 15 - 41 U/L 21 - -  ALT 0 - 44 U/L 18 - -    Imaging studies:  EXAM: ULTRASOUND ABDOMEN LIMITED RIGHT UPPER QUADRANT  COMPARISON:  CT a dated October 24, 2019  FINDINGS: Gallbladder:  The sonographic Percell Miller sign is negative. There is gallbladder sludge without evidence for gallstones. The gallbladder is distended measuring approximately 10.9 x 5.4 x 5.8 cm. There is no gallbladder wall thickening or pericholecystic free fluid.  Common bile duct:  Diameter: 3 mm  Liver:  No focal lesion identified. Within normal limits in parenchymal echogenicity. Portal vein is patent on color Doppler imaging with normal direction of blood flow towards the liver.  Other: None.  IMPRESSION: Distended gallbladder with gallbladder sludge without evidence for acute cholecystitis.   Electronically Signed   By: Constance Holster M.D.   On: 10/29/2019 18:53  Assessment/Plan:  49 y.o. female consulted for distended gallbladder, complicated by pertinent comorbidities including  bacteremia, UTI, aspiration pneumonia, metabolic encephalopathy, hyponatremia (resolved).  The patient was consulted due to CT scan finding of distended gallbladder.  Upon evaluation of the hospitalist the patient was complaining of right upper quadrant pain.  During my evaluation the patient complained of left upper quadrant  pain.  This could be typical of this patient that cannot specifically localize the pain.  I press pretty deeply in the right upper quadrant without any tenderness.  Abdominal ultrasound shows distended carotid with sludge but no gallbladder wall thickening or pericholecystic fluid.  Murphy sign was negative.  I considered that the dilation of the gallbladder is an incidental finding.  The plane on the left upper quadrant the patient complained during my evaluation can be coming from her chronic constipation.  May consider laxative versus enemas if needed.  I think that the bacteremia is coming from the UTI.  I do not recommend any further imaging or management for the distention of the gallbladder.  Patient already being treated for her pneumonia, UTI, bacteremia.  No contraindication for diet.  Arnold Long, MD

## 2019-10-29 NOTE — Consult Note (Signed)
Pharmacy Antibiotic Note  Katherine Perez is a 49 y.o. female admitted on 10/26/2019 with bacteremia.  Pharmacy has been consulted for meropenem dosing. She is currently growing an ESBL Klebsiella in her urine with blood culture also growing Klebsiella (sensitivites pending) with the assumption of the same sensitivity profile. Her renal function is improving but not yet back to baseline.  Plan:   Start meropenem 500 mg IV every 12 hours  Height: 5\' 9"  (175.3 cm) Weight: 57.1 kg (125 lb 14.1 oz) IBW/kg (Calculated) : 66.2  Temp (24hrs), Avg:98.6 F (37 C), Min:98.2 F (36.8 C), Max:98.9 F (37.2 C)  Recent Labs  Lab 10/26/19 1150 10/26/19 1309 10/26/19 1447 10/26/19 1545 10/27/19 0241 10/27/19 0545 10/27/19 0957 10/28/19 0654 10/29/19 0421  WBC 21.6*  --   --   --   --  17.6*  --  19.5* 17.2*  CREATININE 4.88*  --   --    < > 4.78* 4.71* 4.58* 4.18* 3.84*  LATICACIDVEN  --  0.9 0.7  --   --   --   --   --   --    < > = values in this interval not displayed.    Estimated Creatinine Clearance: 16.2 mL/min (A) (by C-G formula based on SCr of 3.84 mg/dL (H)).     Antimicrobials this admission: Unasyn 7/5 >> 7/7 ceftriaxone 7/7 >> 7/8 meropenem 7/8 >>  Microbiology results: 7/5 BCx: 3/4 K pneumoniae 7/5 UCx: ESBL Klebsiella  7/6 MRSA PCR: negative  Thank you for allowing pharmacy to be a part of this patient's care.  Dallie Piles 10/29/2019 7:55 AM

## 2019-10-30 LAB — COMPREHENSIVE METABOLIC PANEL
ALT: 15 U/L (ref 0–44)
AST: 16 U/L (ref 15–41)
Albumin: 1.9 g/dL — ABNORMAL LOW (ref 3.5–5.0)
Alkaline Phosphatase: 98 U/L (ref 38–126)
Anion gap: 12 (ref 5–15)
BUN: 82 mg/dL — ABNORMAL HIGH (ref 6–20)
CO2: 26 mmol/L (ref 22–32)
Calcium: 8.3 mg/dL — ABNORMAL LOW (ref 8.9–10.3)
Chloride: 92 mmol/L — ABNORMAL LOW (ref 98–111)
Creatinine, Ser: 3.01 mg/dL — ABNORMAL HIGH (ref 0.44–1.00)
GFR calc Af Amer: 20 mL/min — ABNORMAL LOW (ref >60)
GFR calc non Af Amer: 18 mL/min — ABNORMAL LOW (ref >60)
Glucose, Bld: 100 mg/dL — ABNORMAL HIGH (ref 70–99)
Potassium: 3 mmol/L — ABNORMAL LOW (ref 3.5–5.1)
Sodium: 130 mmol/L — ABNORMAL LOW (ref 135–145)
Total Bilirubin: 1 mg/dL (ref 0.3–1.2)
Total Protein: 5.5 g/dL — ABNORMAL LOW (ref 6.5–8.1)

## 2019-10-30 LAB — MAGNESIUM: Magnesium: 1.9 mg/dL (ref 1.7–2.4)

## 2019-10-30 LAB — CBC
HCT: 27.6 % — ABNORMAL LOW (ref 36.0–46.0)
Hemoglobin: 10.2 g/dL — ABNORMAL LOW (ref 12.0–15.0)
MCH: 31.3 pg (ref 26.0–34.0)
MCHC: 37 g/dL — ABNORMAL HIGH (ref 30.0–36.0)
MCV: 84.7 fL (ref 80.0–100.0)
Platelets: 229 10*3/uL (ref 150–400)
RBC: 3.26 MIL/uL — ABNORMAL LOW (ref 3.87–5.11)
RDW: 12.3 % (ref 11.5–15.5)
WBC: 15.6 10*3/uL — ABNORMAL HIGH (ref 4.0–10.5)
nRBC: 0 % (ref 0.0–0.2)

## 2019-10-30 MED ORDER — PNEUMOCOCCAL VAC POLYVALENT 25 MCG/0.5ML IJ INJ
0.5000 mL | INJECTION | INTRAMUSCULAR | Status: AC
  Administered 2019-11-01: 0.5 mL via INTRAMUSCULAR
  Filled 2019-10-30: qty 0.5

## 2019-10-30 MED ORDER — POTASSIUM CHLORIDE CRYS ER 20 MEQ PO TBCR
40.0000 meq | EXTENDED_RELEASE_TABLET | Freq: Two times a day (BID) | ORAL | Status: AC
  Administered 2019-10-30 (×2): 40 meq via ORAL
  Filled 2019-10-30 (×2): qty 2

## 2019-10-30 NOTE — Progress Notes (Addendum)
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  Abuse, anemia, anorexia, asthma, stage III CKD, laxative abuse, recurrent UTI/interstitial cystitis, Raynaud's disease, ongoing tobacco abuse presented to [facility] ED with poor appetite, decreased energy lethargy and confusion.  She was found to have severe hyponatremia, urine concerning for infection chest x-ray also concerning for infection.  She was hospitalized for further management.    Reason for Visit: Hyponatremia.  Acute kidney injury  Consultants:  Nephrology.   Critical care medicine. Infectious disease General surgery  Procedures: None yet  Antibiotics: Anti-infectives (From admission, onward)   Start     Dose/Rate Route Frequency Ordered Stop   10/29/19 1000  meropenem (MERREM) 500 mg in sodium chloride 0.9 % 100 mL IVPB     Discontinue     500 mg 200 mL/hr over 30 Minutes Intravenous Every 12 hours 10/29/19 0801     10/28/19 1400  metroNIDAZOLE (FLAGYL) IVPB 500 mg  Status:  Discontinued        500 mg 100 mL/hr over 60 Minutes Intravenous Every 8 hours 10/28/19 1138 10/29/19 0755   10/28/19 0900  cefTRIAXone (ROCEPHIN) 2 g in sodium chloride 0.9 % 100 mL IVPB  Status:  Discontinued        2 g 200 mL/hr over 30 Minutes Intravenous Every 24 hours 10/28/19 0634 10/29/19 0755   10/26/19 2200  ceFEPIme (MAXIPIME) 2 g in sodium chloride 0.9 % 100 mL IVPB  Status:  Discontinued        2 g 200 mL/hr over 30 Minutes Intravenous Every 24  hours 10/26/19 1708 10/26/19 1751   10/26/19 2000  Ampicillin-Sulbactam (UNASYN) 3 g in sodium chloride 0.9 % 100 mL IVPB  Status:  Discontinued        3 g 200 mL/hr over 30 Minutes Intravenous Every 12 hours 10/26/19 1817 10/28/19 1138   10/26/19 1900  fluconazole (DIFLUCAN) IVPB 100 mg     Discontinue     100 mg 50 mL/hr over 60 Minutes Intravenous Every 24 hours 10/26/19 1757     10/26/19 1315  ceFEPIme (MAXIPIME) 1 g in sodium chloride 0.9 % 100 mL IVPB        1 g 200 mL/hr over 30 Minutes Intravenous  Once 10/26/19 1308 10/26/19 1441      Subjective/Interval History: Patient noted to be much more awake and alert today.  She denies any complaints this morning.  Specifically denies any abdominal pain.  No nausea or vomiting.  Ready for solid diet.  Has been tolerating liquids.    Assessment/Plan:  Hyponatremia Presented with a sodium of 118. Her sodium level was 134 on 6/2.    Likely multifactorial including dehydration, alcohol abuse.  Patient was given IV fluids with no improvement in sodium level.  Nephrology was consulted.  Urine osmolality noted to be 233.  Serum osmolality 295.  Patient briefly required 3% saline with improvement in sodium.  The saline was discontinued. Nephrology continues to follow.  Sodium level is stable.  IV fluids to be discontinued today.  Discussed with nephrology.      Acute metabolic encephalopathy Most likely due  to combination of hyponatremia, infection, acidosis, alcohol withdrawal.  Mental status seems to be improving.  She is answering more questions today than she did a few days ago.  No focal neurological deficits.  Continue thiamine.  Reorient daily.  Informed by nursing staff that she is unsteady on her feet.  PT and OT evaluation.  Acute on chronic kidney disease stage III/hypokalemia Creatinine was 1.4 on June 2.  Presented with a creatinine of 4.88.  Most likely due to NSAID use and dehydration.   Patient was started on IV fluids.  Renal  function is improving.  Creatinine is down to 3.01.  Monitor urine output.  Potassium being repleted by pharmacy/nephrology.  Elevated anion gap metabolic acidosis Most likely due to acute kidney injury and hypovolemia.  Alcohol likely contributing as well.  She is not a diabetic.  Ketones noted in the urine however this could be due to starvation.  Metabolic acidosis has resolved.  Bicarbonate infusion was discontinued a few days ago.  Klebsiella bacteremia/UTI with ESBL Klebsiella/chronic interstitial cystitis/sepsis present on admission. Patient was initially on Unasyn for aspiration pneumonia.  Subsequently on ceftriaxone and metronidazole.  Her urine culture is growing ESBL Klebsiella.  She was changed over to meropenem.  She is also bacteremic with the same organism. ID is following.  Source of the infection is urine. CT scan of the abdomen pelvis did not show any acute issues in the GU tract however an enlarged gallbladder was noted.  Tiny bilateral nonobstructing renal calculi were seen.   Sepsis physiology has resolved.  Significantly distended gallbladder Significantly distended gallbladder was noted on the CT scan.  She was noted to have a abdominal tenderness.  Her confusion made it difficult to elicit history.  General surgery was consulted.  Ultrasound of the abdomen was done.  It does not appear that her gallbladder is an active issue currently.  Her LFTs are normal.  No plan for any surgical intervention currently.    Oral candidiasis On Diflucan per PCCM.  Will stop after 7-day course.  Suspected aspiration pneumonia Respiratory status seems to be stable.  Continue antibacterials.  Respiratory status is stable.  Leukocytosis Secondary to infection as well as reactive component.  WBC is slowly improving.  Normocytic anemia Likely due to acute illness.  No evidence for overt bleeding.  Hemoglobin stable for the most part.  No evidence of overt bleeding.  Chronic  constipation and anorexia Bowel regimen to continue.  CT scan also showed findings suggestive of colitis.  Patient apparently has chronic GI issues with chronic constipation.  She is followed by gastroenterology in [place].  Here in the hospital patient does not have any diarrhea.  No other GI symptoms or signs noted.  No further work-up at this time.  Discussed with her husband.  She can follow-up with her gastroenterologist at [facility].  History of asthma Patient noted to be on inhalers.  Also noted to be on theophylline.  Holding it for now.  Theophylline level is 17.8.  Tobacco abuse Nicotine patch  History of alcohol abuse Continue CIWA protocol.  Prolonged QTC Avoid QT prolonging medications.  Continue to correct electrolytes.  EKG was ordered yesterday but not done.  We will reorder.  Pressure injury Pressure Injury 10/27/19 Sacrum Stage 1 -  Intact skin with non-blanchable redness of a localized area usually over a bony prominence. (Active)  10/27/19 0820  Location: Sacrum  Location Orientation:   Staging: Stage 1 -  Intact skin with non-blanchable redness of a  localized area usually over a bony prominence.  Wound Description (Comments):   Present on Admission: Yes    DVT Prophylaxis: Subcutaneous heparin Code Status: Full code Family Communication: No family at bedside. Disposition Plan:  Status is: Inpatient  Remains inpatient appropriate because:Persistent severe electrolyte disturbances and Altered mental status   Dispo:  Patient From: Home  Planned Disposition: Home  Expected discharge date: 11/02/19  Medically stable for discharge: No      Medications:  Scheduled: . Chlorhexidine Gluconate Cloth  6 each Topical Q0600  . famotidine  20 mg Oral Daily  . fluticasone  2 spray Each Nare BID  . folic acid  1 mg Oral Daily  . heparin  5,000 Units Subcutaneous Q8H  . mometasone-formoterol  2 puff Inhalation BID  . multivitamin with minerals  1 tablet Oral Daily   . nicotine  21 mg Transdermal Daily  . pantoprazole  40 mg Oral Daily  . Plecanatide  3 mg Oral Q0600  . potassium chloride  40 mEq Oral BID  . sodium chloride flush  10-40 mL Intracatheter Q12H  . sodium chloride flush  3 mL Intravenous Q12H  . spironolactone  25 mg Oral Daily  . tamsulosin  0.4 mg Oral Daily  . thiamine  100 mg Oral Daily   Or  . thiamine  100 mg Intravenous Daily   Continuous: . fluconazole (DIFLUCAN) IV Stopped (10/29/19 1943)  . lactated ringers with kcl 100 mL/hr at 10/30/19 1133  . meropenem (MERREM) IV Stopped (10/30/19 1003)   OMV:[id number] **OR** acetaminophen, albuterol, ondansetron **OR** ondansetron (ZOFRAN) IV, sodium chloride flush, white petrolatum   Objective:  Vital Signs  Vitals:   10/30/19 0800 10/30/19 0900 10/30/19 1000 10/30/19 1100  BP: 137/80 (!) 148/78 133/89 131/80  Pulse: 94 96 87 98  Resp: 13 (!) 27 16 17   Temp: 98.4 F (36.9 C)     TempSrc: Oral     SpO2: 95% 97% 99% 96%  Weight:      Height:        Intake/Output Summary (Last 24 hours) at 10/30/2019 1140 Last data filed at 10/30/2019 1133 Gross per 24 hour  Intake 2919.52 ml  Output 2500 ml  Net 419.52 ml   Filed Weights   10/27/19 0230 10/28/19 0155 10/29/19 0500  Weight: 57.4 kg 57.5 kg 57.1 kg   General appearance: Awake alert.  In no distress.  Mildly distracted.  More lucid compared to a few days ago. Resp: Clear to auscultation bilaterally.  Normal effort Cardio: S1-S2 is normal regular.  No S3-S4.  No rubs murmurs or bruit GI: Abdomen is soft.  Mildly tender in the left side without any rebound rigidity or guarding.  No masses organomegaly.  Bowel sounds present and normal. Extremities: No edema.  Moving all her extremities Neurologic: Awake.  Oriented to year month place.  Cranial nerves II to XII intact.  Motor strength equal bilateral upper and lower extremities.    Lab Results:  Data Reviewed: I have personally reviewed following labs and imaging  studies  CBC: Recent Labs  Lab 10/26/19 1150 10/27/19 0545 10/28/19 0654 10/29/19 0421 10/30/19 0510  WBC 21.6* 17.6* 19.5* 17.2* 15.6*  NEUTROABS 17.9*  --   --   --   --   HGB 12.3 10.1* 10.4* 9.5* 10.2*  HCT 33.2* 27.3* 27.8* 25.8* 27.6*  MCV 85.8 84.8 83.2 84.9 84.7  PLT 267 228 231 269 662    Basic Metabolic Panel: Recent Labs  Lab 10/26/19 1150  10/26/19 1303 10/27/19 0545 10/27/19 0545 10/27/19 0957 10/27/19 1601 10/27/19 2058 10/27/19 2329 10/28/19 0654 10/29/19 0421 10/30/19 0510  NA 118*   < > 123*   < > 125*   < > 131* 133* 135 131* 130*  K 3.8   < > 2.8*  --  2.4*  --   --   --  2.3* 2.6* 3.0*  CL 86*   < > 88*  --  89*  --   --   --  92* 90* 92*  CO2 7*   < > 10*  --  12*  --   --   --  25 24 26   GLUCOSE 86   < > 93  --  110*  --   --   --  144* 100* 100*  BUN 137*   < > 138*  --  134*  --   --   --  119* 96* 82*  CREATININE 4.88*   < > 4.71*  --  4.58*  --   --   --  4.18* 3.84* 3.01*  CALCIUM 8.1*   < > 6.9*  --  6.6*  --   --   --  6.9* 7.4* 8.3*  MG 3.0*  --  2.3  --   --   --   --   --  2.3 1.8 1.9  PHOS  --   --  7.2*  --   --   --   --   --  4.5 3.3  --    < > = values in this interval not displayed.    GFR: Estimated Creatinine Clearance: 20.6 mL/min (A) (by C-G formula based on SCr of 3.01 mg/dL (H)).  Liver Function Tests: Recent Labs  Lab 10/26/19 1150 10/27/19 0545 10/29/19 0421 10/30/19 0510  AST 39 37 21 16  ALT 31 24 18 15   ALKPHOS 177* 144* 120 98  BILITOT 1.6* 1.8* 1.2 1.0  PROT 7.4 5.9* 5.8* 5.5*  ALBUMIN 2.9* 2.2* 1.9* 1.9*    Recent Labs  Lab 10/26/19 1150  LIPASE 24   Recent Labs  Lab 10/26/19 1150  AMMONIA 16     Recent Results (from the past 240 hour(s))  Group A Strep by PCR ([place] Only)     Status: None   Collection Time: 10/26/19  1:20 PM   Specimen: Throat; Sterile Swab  Result Value Ref Range Status   Group A Strep by PCR NOT DETECTED NOT DETECTED Final    Comment: Performed at [facility], [address], [place], [place] [address]  Urine culture     Status: Abnormal   Collection Time: 10/26/19  1:21 PM   Specimen: Urine, Random  Result Value Ref Range Status   Specimen Description   Final    URINE, RANDOM Performed at [facility], [address] [place], [place] [address]    Special Requests   Final    NONE Performed at [facility], [address] [place], [place] [address]    Culture (A)  Final    >=100,000 COLONIES/mL KLEBSIELLA PNEUMONIAE Confirmed Extended Spectrum Beta-Lactamase Producer (ESBL).  In bloodstream infections from ESBL organisms, carbapenems are preferred over piperacillin/tazobactam. They are shown to have a lower risk of mortality.    Report Status 10/29/2019 FINAL  Final   Organism ID, Bacteria KLEBSIELLA PNEUMONIAE (A)  Final      Susceptibility   Klebsiella pneumoniae - MIC*    AMPICILLIN >=32 RESISTANT Resistant  CEFAZOLIN >=64 RESISTANT Resistant     CEFTRIAXONE >=64 RESISTANT Resistant     CIPROFLOXACIN >=4 RESISTANT Resistant     GENTAMICIN <=1 SENSITIVE Sensitive     IMIPENEM <=0.25 SENSITIVE Sensitive     NITROFURANTOIN 64 INTERMEDIATE Intermediate     TRIMETH/SULFA >=320 RESISTANT Resistant     AMPICILLIN/SULBACTAM >=32 RESISTANT Resistant     PIP/TAZO 16 SENSITIVE Sensitive     * >=100,000 COLONIES/mL KLEBSIELLA PNEUMONIAE  SARS Coronavirus 2 by RT PCR (hospital order, performed in [facility]) Nasopharyngeal Nasopharyngeal Swab     Status: None   Collection Time: 10/26/19  1:40 PM   Specimen: Nasopharyngeal Swab  Result Value Ref Range Status   SARS Coronavirus 2 NEGATIVE NEGATIVE Final    Comment: (NOTE) SARS-CoV-2 target nucleic acids are NOT DETECTED.  The SARS-CoV-2 RNA is generally detectable in upper and lower respiratory specimens during the acute phase of infection. The lowest concentration of SARS-CoV-2 viral copies this assay can detect is 250 copies / mL. A negative  result does not preclude SARS-CoV-2 infection and should not be used as the sole basis for treatment or other patient management decisions.  A negative result may occur with improper specimen collection / handling, submission of specimen other than nasopharyngeal swab, presence of viral mutation(s) within the areas targeted by this assay, and inadequate number of viral copies (<250 copies / mL). A negative result must be combined with clinical observations, patient history, and epidemiological information.  Fact Sheet for Patients:   StrictlyIdeas.no  Fact Sheet for Healthcare Providers: BankingDealers.co.za  This test is not yet approved or  cleared by the [place] FDA and has been authorized for detection and/or diagnosis of SARS-CoV-2 by FDA under an Emergency Use Authorization (EUA).  This EUA will remain in effect (meaning this test can be used) for the duration of the COVID-19 declaration under Section 564(b)(1) of the Act, 21 U.S.C. section 360bbb-3(b)(1), unless the authorization is terminated or revoked sooner.  Performed at [facility], [address] [place], [place] [address]   Culture, blood (routine x 2)     Status: None (Preliminary result)   Collection Time: 10/26/19  1:41 PM   Specimen: BLOOD  Result Value Ref Range Status   Specimen Description BLOOD RIGHT ANTECUBITAL  Final   Special Requests   Final    BOTTLES DRAWN AEROBIC AND ANAEROBIC Blood Culture adequate volume   Culture  Setup Time   Final    GRAM NEGATIVE RODS AEROBIC BOTTLE ONLY Performed at [facility], [address] [place], [place] [address]    Culture GRAM NEGATIVE RODS  Final   Report Status PENDING  Incomplete  Culture, blood (routine x 2)     Status: Abnormal (Preliminary result)   Collection Time: 10/26/19  1:41 PM   Specimen: BLOOD  Result Value Ref Range Status   Specimen Description   Final    BLOOD BLOOD  LEFT FOREARM Performed at [facility], [address] [place], [place] [address]    Special Requests   Final    BOTTLES DRAWN AEROBIC AND ANAEROBIC Blood Culture adequate volume Performed at [facility], [address] [place], [place] [address]    Culture  Setup Time   Final    GRAM NEGATIVE RODS IN BOTH AEROBIC AND ANAEROBIC BOTTLES CRITICAL RESULT CALLED TO, READ BACK BY AND VERIFIED WITH: [place] 10/28/19 [place]    Culture (A)  Final    KLEBSIELLA  PNEUMONIAE SUSCEPTIBILITIES TO FOLLOW REPEATING Performed at [facility], [place] [address] [place], [place] [address]    Report Status PENDING  Incomplete  Blood Culture ID Panel (Reflexed)     Status: Abnormal   Collection Time: 10/26/19  1:41 PM  Result Value Ref Range Status   Enterococcus species NOT DETECTED NOT DETECTED Final   Listeria monocytogenes NOT DETECTED NOT DETECTED Final   Staphylococcus species NOT DETECTED NOT DETECTED Final   Staphylococcus aureus (BCID) NOT DETECTED NOT DETECTED Final   Streptococcus species NOT DETECTED NOT DETECTED Final   Streptococcus agalactiae NOT DETECTED NOT DETECTED Final   Streptococcus pneumoniae NOT DETECTED NOT DETECTED Final   Streptococcus pyogenes NOT DETECTED NOT DETECTED Final   Acinetobacter baumannii NOT DETECTED NOT DETECTED Final   Enterobacteriaceae species DETECTED (A) NOT DETECTED Final    Comment: Enterobacteriaceae represent a large family of gram-negative bacteria, not a single organism. CRITICAL RESULT CALLED TO, READ BACK BY AND VERIFIED WITH:  SCOTT HALL AT 0559 10/28/19 S[place]    Enterobacter cloacae complex NOT DETECTED NOT DETECTED Final   Escherichia coli NOT DETECTED NOT DETECTED Final   Klebsiella oxytoca NOT DETECTED NOT DETECTED Final   Klebsiella pneumoniae DETECTED (A) NOT DETECTED Final    Comment: CRITICAL RESULT CALLED TO, READ BACK BY AND VERIFIED WITH: SCOTT HALL AT 0559 10/28/19 SDR    Proteus species NOT DETECTED  NOT DETECTED Final   Serratia marcescens NOT DETECTED NOT DETECTED Final   Carbapenem resistance NOT DETECTED NOT DETECTED Final   Haemophilus influenzae NOT DETECTED NOT DETECTED Final   Neisseria meningitidis NOT DETECTED NOT DETECTED Final   Pseudomonas aeruginosa NOT DETECTED NOT DETECTED Final   Candida albicans NOT DETECTED NOT DETECTED Final   Candida glabrata NOT DETECTED NOT DETECTED Final   Candida krusei NOT DETECTED NOT DETECTED Final   Candida parapsilosis NOT DETECTED NOT DETECTED Final   Candida tropicalis NOT DETECTED NOT DETECTED Final    Comment: Performed at [facility], [address] [place], [place] [address]  MRSA PCR Screening     Status: None   Collection Time: 10/27/19  2:34 AM   Specimen: Nasopharyngeal  Result Value Ref Range Status   MRSA by PCR NEGATIVE NEGATIVE Final    Comment:        The GeneXpert MRSA Assay (FDA approved for NASAL specimens only), is one component of a comprehensive MRSA colonization surveillance program. It is not intended to diagnose MRSA infection nor to guide or monitor treatment for MRSA infections. Performed at [facility], [address] [place], [place] [address]       Radiology Studies: CT ABDOMEN PELVIS WO CONTRAST  Result Date: 10/28/2019 CLINICAL DATA:  Chronic constipation due to colonic inertia, pelvic floor dysfunction, acid reflux, gastric paresis, iron deficiency anemia, malabsorption, recurrent UTI and interstitial cystitis, ethanol abuse, smoker, presents with severe hyponatremia EXAM: CT ABDOMEN AND PELVIS WITHOUT CONTRAST TECHNIQUE: Multidetector CT imaging of the abdomen and pelvis was performed following the standard protocol without IV contrast. Sagittal and coronal MPR images reconstructed from axial data set. No oral contrast was administered. Exam utilized mA and/or kV adjustment based on patient size in order to minimize patient radiation dose. COMPARISON:  None FINDINGS: Lower  chest: Patchy bibasilar airspace infiltrates consistent with multifocal pneumonia Hepatobiliary: Significantly distended gallbladder 6.4 x 6.5 cm in transverse dimensions and extending it least 11 cm length no definite wall thickening. Large calcified granuloma RIGHT lobe liver posterior to gallbladder. Remainder  of liver unremarkable. Pancreas: Normal appearance Spleen: Normal appearance Adrenals/Urinary Tract: Adrenal thickening without focal mass. Tiny BILATERAL renal calculi. No renal mass lesion. Minimal stranding of perinephric fat planes bilaterally. Mild RIGHT renal collecting system dilatation. Ureters unremarkable. Bladder well distended, unremarkable. Stomach/Bowel: Slightly prominent stool in rectum. Diffuse wall thickening of the colon consistent with colitis. Normal appendix. Small bowel loops less well evaluated, grossly unremarkable. Stomach underdistended, cannot exclude gastric wall thickening diffusely. Vascular/Lymphatic: Extensive atherosclerotic calcifications aorta and iliac arteries for age. Aorta normal caliber. No adenopathy. Reproductive: Atrophic uterus.  Nonvisualization of ovaries. Other: Minimal free fluid. No free air. Tiny umbilical hernia containing fat. Musculoskeletal: Unremarkable IMPRESSION: Patchy bibasilar airspace infiltrates consistent with multifocal pneumonia. Significantly distended question hydropic gallbladder 6.4 x 6.5 x 11 cm in transverse dimensions, recommend correlation with LFTs and potentially RIGHT upper quadrant ultrasound. Diffuse wall thickening of the colon consistent with colitis; differential diagnosis includes infection and inflammatory bowel disease, ischemia considered less likely due to length of involvement. Tiny umbilical hernia containing fat. Tiny BILATERAL nonobstructing renal calculi. Questionable gastric wall thickening versus artifact related to underdistention. Aortic Atherosclerosis (ICD10-I70.0). Electronically Signed   By: Lavonia Dana M.D.    On: 10/28/2019 14:37   US Abdomen Limited RUQ  Result Date: 10/29/2019 CLINICAL DATA:  Abdominal pain. EXAM: ULTRASOUND ABDOMEN LIMITED RIGHT UPPER QUADRANT COMPARISON:  CT a dated October 24, 2019 FINDINGS: Gallbladder: The sonographic Percell Miller sign is negative. There is gallbladder sludge without evidence for gallstones. The gallbladder is distended measuring approximately 10.9 x 5.4 x 5.8 cm. There is no gallbladder wall thickening or pericholecystic free fluid. Common bile duct: Diameter: 3 mm Liver: No focal lesion identified. Within normal limits in parenchymal echogenicity. Portal vein is patent on color Doppler imaging with normal direction of blood flow towards the liver. Other: None. IMPRESSION: Distended gallbladder with gallbladder sludge without evidence for acute cholecystitis. Electronically Signed   By: Constance Holster M.D.   On: 10/29/2019 18:53       LOS: 4 days   [facility] Pager on [email or web address]  10/30/2019, 11:40 AM

## 2019-10-30 NOTE — Progress Notes (Signed)
ID Patient Vitals for the past 24 hrs:  BP Temp Temp src Pulse Resp SpO2  10/30/19 1100 131/80 -- -- 98 17 96 %  10/30/19 1000 133/89 -- -- 87 16 99 %  10/30/19 0900 (!) 148/78 -- -- 96 (!) 27 97 %  10/30/19 0800 137/80 98.4 F (36.9 C) Oral 94 13 95 %  10/30/19 0700 123/71 -- -- 90 16 96 %  10/30/19 0600 139/79 -- -- 89 14 96 %  10/30/19 0500 119/64 -- -- 79 14 95 %  10/30/19 0400 138/78 -- -- 97 15 98 %  10/30/19 0300 130/76 -- -- 82 12 98 %  10/30/19 0200 123/72 -- -- 85 13 96 %  10/30/19 0100 123/68 -- -- 92 13 94 %  10/30/19 0000 129/75 -- -- 90 12 97 %  10/29/19 2300 134/84 -- -- 95 18 97 %  10/29/19 2200 133/81 -- -- 100 18 95 %  10/29/19 2100 129/79 -- -- 98 13 95 %  10/29/19 2000 129/82 99.1 F (37.3 C) Oral (!) 101 13 96 %  10/29/19 1800 135/85 -- -- (!) 101 16 93 %  10/29/19 1700 (!) 141/81 -- -- (!) 105 (!) 27 95 %  10/29/19 1600 134/80 98.5 F (36.9 C) Oral 98 18 96 %  10/29/19 1500 125/78 -- -- 98 (!) 9 94 %  10/29/19 1400 121/77 -- -- 97 12 98 %  10/29/19 1300 126/77 -- -- 93 17 96 %  10/29/19 1200 134/75 98.3 F (36.8 C) Oral 92 18 96 %     CBC Latest Ref Rng & Units 10/30/2019 10/29/2019 10/28/2019  WBC 4.0 - 10.5 K/uL 15.6(H) 17.2(H) 19.5(H)  Hemoglobin 12.0 - 15.0 g/dL 10.2(L) 9.5(L) 10.4(L)  Hematocrit 36 - 46 % 27.6(L) 25.8(L) 27.8(L)  Platelets 150 - 400 K/uL 229 269 231     CMP Latest Ref Rng & Units 10/30/2019 10/29/2019 10/28/2019  Glucose 70 - 99 mg/dL 100(H) 100(H) 144(H)  BUN 6 - 20 mg/dL 82(H) 96(H) 119(H)  Creatinine 0.44 - 1.00 mg/dL 3.01(H) 3.84(H) 4.18(H)  Sodium 135 - 145 mmol/L 130(L) 131(L) 135  Potassium 3.5 - 5.1 mmol/L 3.0(L) 2.6(LL) 2.3(LL)  Chloride 98 - 111 mmol/L 92(L) 90(L) 92(L)  CO2 22 - 32 mmol/L 26 24 25   Calcium 8.9 - 10.3 mg/dL 8.3(L) 7.4(L) 6.9(L)  Total Protein 6.5 - 8.1 g/dL 5.5(L) 5.8(L) -  Total Bilirubin 0.3 - 1.2 mg/dL 1.0 1.2 -  Alkaline Phos 38 - 126 U/L 98 120 -  AST 15 - 41 U/L 16 21 -  ALT 0 - 44 U/L 15 18 -    Impression/recommendation  Metabolic encephalopathy secondary to hyponatremia and uremic encephalopathy.  Much improved  Klebsiella bacteremia Klebsiella urinary tract infection with possible pyelonephritis.  Patient is currently on meropenem as the Klebsiella is an ESBL. The appropriate antibiotic was only started on 10/29/19 Patient is going to need at least 10- days of IV antibiotic on discharge.  Very likely that would be ertapenem.  End date will be 11/08/2019. Recommend to check a post void bladder scan to make sure there is no incomplete bladder emptying  Oral candidiasis- resolved now- on IV fluconazole as per intensivist She has received 4 doses- can be stopped   History of interstitial cystitis followed at [facility] . History of recurrent UTI as per patient has been on multiple antibiotics in the past   Acute on chronic renal disease: Multifactorial: Seen by nephrology and he thinks the combination of  infection, NSAID and volume depletion  CT scan yesterday showed significantly distended gallbladder which could be due to her not eating.  seen by surgery -no need for any intervention as no cholecystitis  CT scan also showed thickening of the colon wall.  Suggesting colitis but patient clinically does not behave like colitis.  Discussed te management with her husband  ___________________________________________________

## 2019-10-30 NOTE — Consult Note (Signed)
Pharmacy Antibiotic Note  Katherine Perez is a 49 y.o. female admitted on 10/26/2019 with bacteremia.  Pharmacy has been consulted for meropenem dosing. She is currently growing an ESBL Klebsiella in her urine with blood culture also growing Klebsiella (sensitivites pending) with the assumption of the same sensitivity profile. Her renal function is improving but not yet back to baseline. This is day  # 2 meropenem with blood culture senitivities still pending  Plan:   Continue meropenem 500 mg IV every 12 hours  Height: 5\' 9"  (175.3 cm) Weight: 57.1 kg (125 lb 14.1 oz) IBW/kg (Calculated) : 66.2  Temp (24hrs), Avg:98.6 F (37 C), Min:98.3 F (36.8 C), Max:99.1 F (37.3 C)  Recent Labs  Lab 10/26/19 1150 10/26/19 1309 10/26/19 1447 10/26/19 1545 10/27/19 0545 10/27/19 0957 10/28/19 0654 10/29/19 0421 10/30/19 0510  WBC 21.6*  --   --   --  17.6*  --  19.5* 17.2* 15.6*  CREATININE 4.88*  --   --    < > 4.71* 4.58* 4.18* 3.84* 3.01*  LATICACIDVEN  --  0.9 0.7  --   --   --   --   --   --    < > = values in this interval not displayed.    Estimated Creatinine Clearance: 20.6 mL/min (A) (by C-G formula based on SCr of 3.01 mg/dL (H)).     Antimicrobials this admission: Unasyn 7/5 >> 7/7 ceftriaxone 7/7 >> 7/8 meropenem 7/8 >>  Microbiology results: 7/5 BCx: 3/4 K pneumoniae 7/5 UCx: ESBL Klebsiella  7/6 MRSA PCR: negative  Thank you for allowing pharmacy to be a part of this patient's care.  Dallie Piles 10/30/2019 7:09 AM

## 2019-10-30 NOTE — Progress Notes (Signed)
PT Cancellation Note  Patient Details Name: Katherine Perez MRN: [id number] DOB: Jul 13, 1970   Cancelled Treatment:    Reason Eval/Treat Not Completed: Patient at procedure or test/unavailable;Other (comment) (Patient consult received and reviewed. Medical staffing in room upon PT attempt. Will attempt again at later time/date as available.)  Janna Arch, PT, DPT   10/30/2019, 4:11 PM

## 2019-10-30 NOTE — Progress Notes (Signed)
CRITICAL CARE PROGRESS NOTE    Name: Katherine Perez MRN: [id number] DOB: [birth year]     LOS: 4   SUBJECTIVE FINDINGS & SIGNIFICANT EVENTS    Patient description:  7 female admitted with altered mental status with confusion as well as acrocyanosis, mild shortness of breath, history of anorexia and bulimia with laxative abuse.  Patient admits to malnourishment as well as active alcoholism however states this has improved over the last 10 years.  She has a history of recurrent urinary tract infections and states that she has not been without a urinary infection last 6 months.  She does not have a history of recent weight loss and reports an improvement in her weight over the last 1 month however has not been able to eat over the last 1 week.  Generally patient is Pescatarian and eats fish for protein. She had mother with breast and ovarian cancer. She is actively smoking 1.5packs daily.  She has significant oral thrush extending down past uvula on exam.   Lines/tubes : 3NS  10/29/19 - patient clinically improved, no overnight events in MICU/SDU.  Discussed case with surgery regarding abdominal pain, no surgical intervention, patient with high stool load on CT abd indicative of constipation will increase bowel regimen.  10/30/19- patient clinically improved significantly. Will sign off and plan for transfer to [facility]  Microbiology/Sepsis markers: Results for orders placed or performed during the hospital encounter of 10/26/19  Group A Strep by PCR ([place] Only)     Status: None   Collection Time: 10/26/19  1:20 PM   Specimen: Throat; Sterile Swab  Result Value Ref Range Status   Group A Strep by PCR NOT DETECTED NOT DETECTED Final    Comment: Performed at [facility], [address] [place], [place] [address]  Urine  culture     Status: Abnormal   Collection Time: 10/26/19  1:21 PM   Specimen: Urine, Random  Result Value Ref Range Status   Specimen Description   Final    URINE, RANDOM Performed at [facility], [address] [place], [place] [address]    Special Requests   Final    NONE Performed at [facility], [address] [place], [place] [address]    Culture (A)  Final    >=100,000 COLONIES/mL KLEBSIELLA PNEUMONIAE Confirmed Extended Spectrum Beta-Lactamase Producer (ESBL).  In bloodstream infections from ESBL organisms, carbapenems are preferred over piperacillin/tazobactam. They are shown to have a lower risk of mortality.    Report Status 10/29/2019 FINAL  Final   Organism ID, Bacteria KLEBSIELLA PNEUMONIAE (A)  Final      Susceptibility   Klebsiella pneumoniae - MIC*    AMPICILLIN >=32 RESISTANT Resistant     CEFAZOLIN >=64 RESISTANT Resistant     CEFTRIAXONE >=64 RESISTANT Resistant     CIPROFLOXACIN >=4 RESISTANT Resistant     GENTAMICIN <=1 SENSITIVE Sensitive     IMIPENEM <=0.25 SENSITIVE Sensitive     NITROFURANTOIN 64 INTERMEDIATE Intermediate     TRIMETH/SULFA >=320 RESISTANT Resistant     AMPICILLIN/SULBACTAM >=32 RESISTANT Resistant     PIP/TAZO 16 SENSITIVE Sensitive     * >=100,000 COLONIES/mL KLEBSIELLA PNEUMONIAE  SARS Coronavirus 2 by RT PCR (hospital order, performed in [place] hospital lab) Nasopharyngeal Nasopharyngeal Swab     Status: None   Collection Time: 10/26/19  1:40 PM   Specimen: Nasopharyngeal Swab  Result Value Ref Range Status   SARS Coronavirus 2 NEGATIVE NEGATIVE Final  Comment: (NOTE) SARS-CoV-2 target nucleic acids are NOT DETECTED.  The SARS-CoV-2 RNA is generally detectable in upper and lower respiratory specimens during the acute phase of infection. The lowest concentration of SARS-CoV-2 viral copies this assay can detect is 250 copies / mL. A negative result does not preclude SARS-CoV-2 infection and should not  be used as the sole basis for treatment or other patient management decisions.  A negative result may occur with improper specimen collection / handling, submission of specimen other than nasopharyngeal swab, presence of viral mutation(s) within the areas targeted by this assay, and inadequate number of viral copies (<250 copies / mL). A negative result must be combined with clinical observations, patient history, and epidemiological information.  Fact Sheet for Patients:   StrictlyIdeas.no  Fact Sheet for Healthcare Providers: BankingDealers.co.za  This test is not yet approved or  cleared by the [place] FDA and has been authorized for detection and/or diagnosis of SARS-CoV-2 by FDA under an Emergency Use Authorization (EUA).  This EUA will remain in effect (meaning this test can be used) for the duration of the COVID-19 declaration under Section 564(b)(1) of the Act, 21 U.S.C. section 360bbb-3(b)(1), unless the authorization is terminated or revoked sooner.  Performed at [facility], [address] [place], [place] [address]   Culture, blood (routine x 2)     Status: None (Preliminary result)   Collection Time: 10/26/19  1:41 PM   Specimen: BLOOD  Result Value Ref Range Status   Specimen Description BLOOD RIGHT ANTECUBITAL  Final   Special Requests   Final    BOTTLES DRAWN AEROBIC AND ANAEROBIC Blood Culture adequate volume   Culture  Setup Time   Final    GRAM NEGATIVE RODS AEROBIC BOTTLE ONLY Performed at [facility], [address], [place], [place] [address]    Culture GRAM NEGATIVE RODS  Final   Report Status PENDING  Incomplete  Culture, blood (routine x 2)     Status: Abnormal (Preliminary result)   Collection Time: 10/26/19  1:41 PM   Specimen: BLOOD  Result Value Ref Range Status   Specimen Description   Final    BLOOD BLOOD LEFT FOREARM Performed at [facility], [address] [place], [place] [address]    Special Requests   Final    BOTTLES DRAWN AEROBIC AND ANAEROBIC Blood Culture adequate volume Performed at [facility], [address] [place], [place] [address]    Culture  Setup Time   Final    GRAM NEGATIVE RODS IN BOTH AEROBIC AND ANAEROBIC BOTTLES CRITICAL RESULT CALLED TO, READ BACK BY AND VERIFIED WITH: [place] 10/28/19 SDR    Culture (A)  Final    KLEBSIELLA PNEUMONIAE SUSCEPTIBILITIES TO FOLLOW REPEATING Performed at [facility], [place] [address], [place], [place] [address]    Report Status PENDING  Incomplete  Blood Culture ID Panel (Reflexed)     Status: Abnormal   Collection Time: 10/26/19  1:41 PM  Result Value Ref Range Status   Enterococcus species NOT DETECTED NOT DETECTED Final   Listeria monocytogenes NOT DETECTED NOT DETECTED Final   Staphylococcus species NOT DETECTED NOT DETECTED Final   Staphylococcus aureus (BCID) NOT DETECTED NOT DETECTED Final   Streptococcus species NOT DETECTED NOT DETECTED Final   Streptococcus agalactiae NOT DETECTED NOT DETECTED Final   Streptococcus pneumoniae NOT DETECTED NOT DETECTED Final   Streptococcus pyogenes NOT DETECTED NOT DETECTED Final   Acinetobacter baumannii NOT DETECTED  NOT DETECTED Final   Enterobacteriaceae species DETECTED (A) NOT DETECTED Final    Comment: Enterobacteriaceae represent a large family of gram-negative bacteria, not a single organism. CRITICAL RESULT CALLED TO, READ BACK BY AND VERIFIED WITH:  SCOTT HALL AT 0559 10/28/19 SDR    Enterobacter cloacae complex NOT DETECTED NOT DETECTED Final   Escherichia coli NOT DETECTED NOT DETECTED Final   Klebsiella oxytoca NOT DETECTED NOT DETECTED Final   Klebsiella pneumoniae DETECTED (A) NOT DETECTED Final    Comment: CRITICAL RESULT CALLED TO, READ BACK BY AND VERIFIED WITH: SCOTT HALL AT 0559 10/28/19 SDR    Proteus species NOT DETECTED NOT DETECTED Final   Serratia marcescens NOT DETECTED NOT  DETECTED Final   Carbapenem resistance NOT DETECTED NOT DETECTED Final   Haemophilus influenzae NOT DETECTED NOT DETECTED Final   Neisseria meningitidis NOT DETECTED NOT DETECTED Final   Pseudomonas aeruginosa NOT DETECTED NOT DETECTED Final   Candida albicans NOT DETECTED NOT DETECTED Final   Candida glabrata NOT DETECTED NOT DETECTED Final   Candida krusei NOT DETECTED NOT DETECTED Final   Candida parapsilosis NOT DETECTED NOT DETECTED Final   Candida tropicalis NOT DETECTED NOT DETECTED Final    Comment: Performed at [facility], [address] [place], [place] [address]  MRSA PCR Screening     Status: None   Collection Time: 10/27/19  2:34 AM   Specimen: Nasopharyngeal  Result Value Ref Range Status   MRSA by PCR NEGATIVE NEGATIVE Final    Comment:        The GeneXpert MRSA Assay (FDA approved for NASAL specimens only), is one component of a comprehensive MRSA colonization surveillance program. It is not intended to diagnose MRSA infection nor to guide or monitor treatment for MRSA infections. Performed at [facility], [address], [place], [place] [address]     Anti-infectives:  Anti-infectives (From admission, onward)   Start     Dose/Rate Route Frequency Ordered Stop   10/29/19 1000  meropenem (MERREM) 500 mg in sodium chloride 0.9 % 100 mL IVPB     Discontinue     500 mg 200 mL/hr over 30 Minutes Intravenous Every 12 hours 10/29/19 0801     10/28/19 1400  metroNIDAZOLE (FLAGYL) IVPB 500 mg  Status:  Discontinued        500 mg 100 mL/hr over 60 Minutes Intravenous Every 8 hours 10/28/19 1138 10/29/19 0755   10/28/19 0900  cefTRIAXone (ROCEPHIN) 2 g in sodium chloride 0.9 % 100 mL IVPB  Status:  Discontinued        2 g 200 mL/hr over 30 Minutes Intravenous Every 24 hours 10/28/19 0634 10/29/19 0755   10/26/19 2200  ceFEPIme (MAXIPIME) 2 g in sodium chloride 0.9 % 100 mL IVPB  Status:  Discontinued        2 g 200 mL/hr over 30 Minutes  Intravenous Every 24 hours 10/26/19 1708 10/26/19 1751   10/26/19 2000  Ampicillin-Sulbactam (UNASYN) 3 g in sodium chloride 0.9 % 100 mL IVPB  Status:  Discontinued        3 g 200 mL/hr over 30 Minutes Intravenous Every 12 hours 10/26/19 1817 10/28/19 1138   10/26/19 1900  fluconazole (DIFLUCAN) IVPB 100 mg     Discontinue     100 mg 50 mL/hr over 60 Minutes Intravenous Every 24 hours 10/26/19 1757     10/26/19 1315  ceFEPIme (MAXIPIME) 1 g in sodium chloride 0.9 % 100 mL IVPB  1 g 200 mL/hr over 30 Minutes Intravenous  Once 10/26/19 1308 10/26/19 1441        PAST MEDICAL HISTORY   Past Medical History:  Diagnosis Date  . Anemia   . CKD (chronic kidney disease)   . ETOH abuse   . GERD (gastroesophageal reflux disease)   . IC (interstitial cystitis)   . Laxative abuse   . Neuropathy   . Raynaud disease      SURGICAL HISTORY   Past Surgical History:  Procedure Laterality Date  . LEG SURGERY       FAMILY HISTORY   History reviewed. No pertinent family history.   SOCIAL HISTORY   Social History   Tobacco Use  . Smoking status: Current Every Day Smoker  . Smokeless tobacco: Never Used  Substance Use Topics  . Alcohol use: Yes    Comment: 4-5 drink per week  . Drug use: Not Currently     MEDICATIONS   Current Medication:  Current Facility-Administered Medications:  .  acetaminophen (TYLENOL) tablet 650 mg, 650 mg, Oral, Q6H PRN, 650 mg at 10/29/19 2346 **OR** acetaminophen (TYLENOL) suppository 650 mg, 650 mg, Rectal, Q6H PRN, Hongalgi, Anand D, MD .  albuterol (PROVENTIL) (2.5 MG/3ML) 0.083% nebulizer solution 2.5 mg, 2.5 mg, Nebulization, Q2H PRN, Hongalgi, Anand D, MD .  Chlorhexidine Gluconate Cloth 2 % PADS 6 each, 6 each, Topical, Q0600, Sharion Settler, NP, 6 each at 10/30/19 0300 .  famotidine (PEPCID) tablet 20 mg, 20 mg, Oral, Daily, Dallie Piles, RPH, 20 mg at 10/30/19 3790 .  fluconazole (DIFLUCAN) IVPB 100 mg, 100 mg, Intravenous,  Q24H, Ottie Glazier, MD, Stopped at 10/29/19 1943 .  fluticasone (FLONASE) 50 MCG/ACT nasal spray 2 spray, 2 spray, Each Nare, BID, Modena Jansky, MD, 2 spray at 10/30/19 [phone]  folic acid (FOLVITE) tablet 1 mg, 1 mg, Oral, Daily, Hongalgi, Anand D, MD, 1 mg at 10/30/19 0922 .  heparin injection 5,000 Units, 5,000 Units, Subcutaneous, Q8H, Hongalgi, Lenis Dickinson, MD, 5,000 Units at 10/30/19 0920 .  lactated ringers 1,000 mL with potassium chloride 40 mEq infusion, , Intravenous, Continuous, Dallie Piles, RPH, Last Rate: 100 mL/hr at 10/30/19 0500, Rate Verify at 10/30/19 0500 .  meropenem (MERREM) 500 mg in sodium chloride 0.9 % 100 mL IVPB, 500 mg, Intravenous, Q12H, Dallie Piles, RPH, Last Rate: 200 mL/hr at 10/30/19 0933, 500 mg at 10/30/19 0933 .  mometasone-formoterol (DULERA) 100-5 MCG/ACT inhaler 2 puff, 2 puff, Inhalation, BID, Hongalgi, Anand D, MD, 2 puff at 10/30/19 0900 .  multivitamin with minerals tablet 1 tablet, 1 tablet, Oral, Daily, Hongalgi, Anand D, MD, 1 tablet at 10/30/19 0923 .  nicotine (NICODERM CQ - dosed in mg/24 hours) patch 21 mg, 21 mg, Transdermal, Daily, Hongalgi, Anand D, MD, 21 mg at 10/30/19 0925 .  ondansetron (ZOFRAN) tablet 4 mg, 4 mg, Oral, Q6H PRN **OR** ondansetron (ZOFRAN) injection 4 mg, 4 mg, Intravenous, Q6H PRN, Hongalgi, Anand D, MD .  pantoprazole (PROTONIX) EC tablet 40 mg, 40 mg, Oral, Daily, Hongalgi, Anand D, MD, 40 mg at 10/30/19 7353 .  Plecanatide TABS 3 mg, 3 mg, Oral, Q0600, Hongalgi, Anand D, MD .  potassium chloride SA (KLOR-CON) CR tablet 40 mEq, 40 mEq, Oral, BID, Dallie Piles, RPH, 40 mEq at 10/30/19 2992 .  sodium chloride flush (NS) 0.9 % injection 10-40 mL, 10-40 mL, Intracatheter, Q12H, Hongalgi, Anand D, MD, 10 mL at 10/30/19 0925 .  sodium chloride flush (NS) 0.9 %  injection 10-40 mL, 10-40 mL, Intracatheter, PRN, Hongalgi, Anand D, MD .  sodium chloride flush (NS) 0.9 % injection 3 mL, 3 mL, Intravenous, Q12H, Hongalgi,  Anand D, MD, 3 mL at 10/30/19 0925 .  spironolactone (ALDACTONE) tablet 25 mg, 25 mg, Oral, Daily, Murlean Iba, MD, 25 mg at 10/30/19 0923 .  tamsulosin (FLOMAX) capsule 0.4 mg, 0.4 mg, Oral, Daily, Hongalgi, Anand D, MD, 0.4 mg at 10/30/19 3875 .  thiamine tablet 100 mg, 100 mg, Oral, Daily, 100 mg at 10/30/19 6433 **OR** thiamine (B-1) injection 100 mg, 100 mg, Intravenous, Daily, Hongalgi, Anand D, MD, 100 mg at 10/27/19 0943 .  white petrolatum (VASELINE) gel, , Topical, PRN, Bonnielee Haff, MD, 1 application at 29/51/88 0936    ALLERGIES   Cetirizine, Diazepam, and Baclofen    REVIEW OF SYSTEMS    10 point ROS done and is negative except    PHYSICAL EXAMINATION   Vital Signs: Temp:  [98.3 F (36.8 C)-99.1 F (37.3 C)] 99.1 F (37.3 C) (07/08 2000) Pulse Rate:  [79-105] 89 (07/09 0600) Resp:  [9-27] 14 (07/09 0600) BP: (119-141)/(64-85) 139/79 (07/09 0600) SpO2:  [93 %-98 %] 96 % (07/09 0600)  GENERAL:NAD mild confusion HEAD: Normocephalic, atraumatic.  EYES: Pupils equal, round, reactive to light.  No scleral icterus.  MOUTH: Moist mucosal membrane. NECK: Supple. No thyromegaly. No nodules. No JVD.  PULMONARY: CTAB CARDIOVASCULAR: S1 and S2. Regular rate and rhythm. No murmurs, rubs, or gallops.  GASTROINTESTINAL: Soft, nontender, non-distended. No masses. Positive bowel sounds. No hepatosplenomegaly. Mild LUQ tenderness MUSCULOSKELETAL: No swelling, clubbing, or edema.  NEUROLOGIC: Mild distress due to acute illness SKIN:intact,warm,dry   PERTINENT DATA     Infusions: . fluconazole (DIFLUCAN) IV Stopped (10/29/19 1943)  . lactated ringers with kcl 100 mL/hr at 10/30/19 0500  . meropenem (MERREM) IV 500 mg (10/30/19 0933)   Scheduled Medications: . Chlorhexidine Gluconate Cloth  6 each Topical Q0600  . famotidine  20 mg Oral Daily  . fluticasone  2 spray Each Nare BID  . folic acid  1 mg Oral Daily  . heparin  5,000 Units Subcutaneous Q8H  .  mometasone-formoterol  2 puff Inhalation BID  . multivitamin with minerals  1 tablet Oral Daily  . nicotine  21 mg Transdermal Daily  . pantoprazole  40 mg Oral Daily  . Plecanatide  3 mg Oral Q0600  . potassium chloride  40 mEq Oral BID  . sodium chloride flush  10-40 mL Intracatheter Q12H  . sodium chloride flush  3 mL Intravenous Q12H  . spironolactone  25 mg Oral Daily  . tamsulosin  0.4 mg Oral Daily  . thiamine  100 mg Oral Daily   Or  . thiamine  100 mg Intravenous Daily   PRN Medications: acetaminophen **OR** acetaminophen, albuterol, ondansetron **OR** ondansetron (ZOFRAN) IV, sodium chloride flush, white petrolatum Hemodynamic parameters:   Intake/Output: 07/08 0701 - 07/09 0700 In: 2158 [P.O.:360; I.V.:1547.9; IV Piggyback:250.1] Out: 1350 [CZYSA:6301]  Ventilator  Settings:      LAB RESULTS:  Basic Metabolic Panel: Recent Labs  Lab 10/26/19 1150 10/26/19 1303 10/27/19 0545 10/27/19 0545 10/27/19 0957 10/27/19 0957 10/27/19 1601 10/27/19 2058 10/27/19 2329 10/28/19 0654 10/28/19 0654 10/29/19 0421 10/30/19 0510  NA 118*   < > 123*   < > 125*  --    < > 131* 133* 135  --  131* 130*  K 3.8   < > 2.8*   < > 2.4*   < >  --   --   --  2.3*   < > 2.6* 3.0*  CL 86*   < > 88*  --  89*  --   --   --   --  92*  --  90* 92*  CO2 7*   < > 10*  --  12*  --   --   --   --  25  --  24 26  GLUCOSE 86   < > 93  --  110*  --   --   --   --  144*  --  100* 100*  BUN 137*   < > 138*  --  134*  --   --   --   --  119*  --  96* 82*  CREATININE 4.88*   < > 4.71*  --  4.58*  --   --   --   --  4.18*  --  3.84* 3.01*  CALCIUM 8.1*   < > 6.9*  --  6.6*  --   --   --   --  6.9*  --  7.4* 8.3*  MG 3.0*  --  2.3  --   --   --   --   --   --  2.3  --  1.8 1.9  PHOS  --   --  7.2*  --   --   --   --   --   --  4.5  --  3.3  --    < > = values in this interval not displayed.   Liver Function Tests: Recent Labs  Lab 10/26/19 1150 10/27/19 0545 10/29/19 0421 10/30/19 0510    AST 39 37 21 16  ALT 31 24 18 15   ALKPHOS 177* 144* 120 98  BILITOT 1.6* 1.8* 1.2 1.0  PROT 7.4 5.9* 5.8* 5.5*  ALBUMIN 2.9* 2.2* 1.9* 1.9*   Recent Labs  Lab 10/26/19 1150  LIPASE 24   Recent Labs  Lab 10/26/19 1150  AMMONIA 16   CBC: Recent Labs  Lab 10/26/19 1150 10/27/19 0545 10/28/19 0654 10/29/19 0421 10/30/19 0510  WBC 21.6* 17.6* 19.5* 17.2* 15.6*  NEUTROABS 17.9*  --   --   --   --   HGB 12.3 10.1* 10.4* 9.5* 10.2*  HCT 33.2* 27.3* 27.8* 25.8* 27.6*  MCV 85.8 84.8 83.2 84.9 84.7  PLT 267 228 231 269 229   Cardiac Enzymes: No results for input(s): CKTOTAL, CKMB, CKMBINDEX, TROPONINI in the last 168 hours. 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 Sagittal and coronal MPR images reconstructed from axial data set. No oral contrast was administered. Exam utilized mA and/or kV adjustment based on patient size in order to minimize patient radiation dose. COMPARISON:  None FINDINGS: Lower chest: Patchy bibasilar airspace infiltrates consistent with multifocal pneumonia Hepatobiliary: Significantly distended gallbladder 6.4 x 6.5 cm in transverse dimensions and extending it least 11 cm length no definite wall thickening. Large calcified granuloma RIGHT lobe liver posterior to gallbladder. Remainder of liver unremarkable. Pancreas:  Normal appearance Spleen: Normal appearance Adrenals/Urinary Tract:  Adrenal thickening without focal mass. Tiny BILATERAL renal calculi. No renal mass lesion. Minimal stranding of perinephric fat planes bilaterally. Mild RIGHT renal collecting system dilatation. Ureters unremarkable. Bladder well distended, unremarkable. Stomach/Bowel: Slightly prominent stool in rectum. Diffuse wall thickening of the colon consistent with colitis. Normal appendix. Small bowel loops less well evaluated, grossly unremarkable. Stomach underdistended, cannot exclude gastric wall thickening diffusely. Vascular/Lymphatic: Extensive atherosclerotic calcifications aorta and iliac arteries for age. Aorta normal caliber. No adenopathy. Reproductive: Atrophic uterus.  Nonvisualization of ovaries. Other: Minimal free fluid. No free air. Tiny umbilical hernia containing fat. Musculoskeletal: Unremarkable IMPRESSION: Patchy bibasilar airspace infiltrates consistent with multifocal pneumonia. Significantly distended question hydropic gallbladder 6.4 x 6.5 x 11 cm in transverse dimensions, recommend correlation with LFTs and potentially RIGHT upper quadrant ultrasound. Diffuse wall thickening of the colon consistent with colitis; differential diagnosis includes infection and inflammatory bowel disease, ischemia considered less likely due to length of involvement. Tiny umbilical hernia containing fat. Tiny BILATERAL nonobstructing renal calculi. Questionable gastric wall thickening versus artifact related to underdistention. Aortic Atherosclerosis (ICD10-I70.0). Electronically Signed   By: Lavonia Dana M.D.   On: 10/28/2019 14:37   US Abdomen Limited RUQ  Result Date: 10/29/2019 CLINICAL DATA:  Abdominal pain. EXAM: ULTRASOUND ABDOMEN LIMITED RIGHT UPPER QUADRANT COMPARISON:  CT a dated October 24, 2019 FINDINGS: Gallbladder: The sonographic Percell Miller sign is negative. There is gallbladder sludge without evidence for gallstones. The gallbladder is distended measuring approximately 10.9 x 5.4 x 5.8 cm. There is no  gallbladder wall thickening or pericholecystic free fluid. Common bile duct: Diameter: 3 mm Liver: No focal lesion identified. Within normal limits in parenchymal echogenicity. Portal vein is patent on color Doppler imaging with normal direction of blood flow towards the liver. Other: None. IMPRESSION: Distended gallbladder with gallbladder sludge without evidence for acute cholecystitis. Electronically Signed   By: Constance Holster M.D.   On: 10/29/2019 18:53   @[email or web address] @ US Abdomen Limited RUQ  Result Date: 10/29/2019 CLINICAL DATA:  Abdominal pain. EXAM: ULTRASOUND ABDOMEN LIMITED RIGHT UPPER QUADRANT COMPARISON:  CT a dated October 24, 2019 FINDINGS: Gallbladder: The sonographic Percell Miller sign is negative. There is gallbladder sludge without evidence for gallstones. The gallbladder is distended measuring approximately 10.9 x 5.4 x 5.8 cm. There is no gallbladder wall thickening or pericholecystic free fluid. Common bile duct: Diameter: 3 mm Liver: No focal lesion identified. Within normal limits in parenchymal echogenicity. Portal vein is patent on color Doppler imaging with normal direction of blood flow towards the liver. Other: None. IMPRESSION: Distended gallbladder with gallbladder sludge without evidence for acute cholecystitis. Electronically Signed   By: Constance Holster M.D.   On: 10/29/2019 18:53       ASSESSMENT AND PLAN    -Multidisciplinary rounds held today   Altered mental status with confusion  - due to hyponatremia with toxic metabolic encephalopathy and intercurrent UTI with interstitial nephritis -slow correction of Na per nephro-improved 10/28/19  COPD with centrilobular emphysema   -continue home Advair   - no signs of exacerbation during examination today   Sepsis with multi organ dysfunction syndrome  - present on admission - due to resistant Klebsiella bacteremia from Klebsiella UTI  - discussed with pharmD - reviewed previous sensitivities - will narrow regimen to  Rocephin and flagyl-no ESBL per previous cx  - patient is not currently requiring vasopressor support -ID on case - appreciate input   Aspiration pneumonia   - hx of EtOH   -  Unasyn    -monitor signs of whithdrawal   - CIWA protocol   - thiamine/folate repletion   Oral thrush   - HCV/HIV eval negative    - patient also reports hx of vaginal yeast infection    - Diflucan 100 IV daily x5d    Severe protein calorie malnutrition   -low albumin   - bitemporal and peripheral muscle wasting.    ID -continue IV abx as prescibed -follow up cultures  GI/Nutrition GI PROPHYLAXIS as indicated DIET-->TF's as tolerated Constipation protocol as indicated  ENDO - ICU hypoglycemic\Hyperglycemia protocol -check FSBS per protocol   ELECTROLYTES -follow labs as needed -replace as needed -pharmacy consultation   DVT/GI PRX ordered -SCDs  TRANSFUSIONS AS NEEDED MONITOR FSBS ASSESS the need for LABS as needed    This document was prepared using Dragon voice recognition software and may include unintentional dictation errors.    Ottie Glazier, M.D.  Division of [place]

## 2019-10-30 NOTE — Evaluation (Signed)
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  Abuse, anemia, anorexia, asthma, stage III CKD, laxative abuse, recurrent UTI/interstitial cystitis, Raynaud's disease, ongoing tobacco abuse presented to [facility] ED with poor appetite, decreased energy lethargy and confusion.  She was found to have severe hyponatremia, urine concerning for infection chest x-ray also concerning for infection.  She was hospitalized for further management.   Clinical Impression   Ms Shibley was seen for OT evaluation this date. Prior to hospital admission, pt was MOD I for mobility using SPC and RW. Pt lives c husband and 2 dogs. Pt presents to acute OT demonstrating impaired ADL performance, functional cognition, and functional mobility 2/2 decreased sequencing, functional balance/strength deficits, and decreased insights into deficits. Pt currently requires SETUP c self-feeding. SUPERVISION don B socks at bed level. MIN A x2 for BSC SPT. Pt would benefit from skilled OT to address noted impairments and functional limitations (see below for any additional details) in order to maximize safety and independence while minimizing falls risk and caregiver burden. Upon hospital discharge, recommend [place] c 24HR supervision to maximize pt safety and return to functional independence during meaningful occupations of daily life.     Follow Up Recommendations  Home health OT;Supervision/Assistance - 24 hour (Pending pt progressing c t/fs;  otherwise SNF)    Equipment Recommendations       Recommendations for Other Services       Precautions / Restrictions Precautions Precautions: Fall Restrictions Weight Bearing Restrictions: No      Mobility Bed Mobility Overal bed mobility: Needs Assistance Bed Mobility: Rolling;Supine to Sit;Sit to Supine Rolling: Supervision   Supine to sit: Supervision Sit to supine: Supervision      Transfers Overall transfer level: Needs assistance Equipment used: 2 person hand held assist Transfers: Sit to/from Stand;Stand Pivot Transfers Sit to Stand: Min assist;+2 physical assistance Stand pivot transfers: Min assist;+2 physical assistance            Balance Overall balance assessment: Needs assistance Sitting-balance support: Single extremity supported;Feet supported Sitting balance-Leahy Scale: Good     Standing balance support: Bilateral upper extremity supported;During functional activity Standing balance-Leahy Scale: Poor                             ADL either performed or assessed with clinical judgement   ADL Overall ADL's : Needs assistance/impaired                                       General ADL Comments: SETUP self-feeding. SUPERVISION don B socks at bed level. MIN A x2 for BSC SPT.      Vision         Perception     Praxis      Pertinent Vitals/Pain Pain Assessment: No/denies pain     Hand Dominance Right   Extremity/Trunk Assessment Upper Extremity Assessment Upper Extremity Assessment: Generalized weakness (BUE 4/5 grossly )   Lower Extremity Assessment Lower Extremity Assessment: Generalized weakness  Communication Communication Communication: No difficulties   Cognition Arousal/Alertness: Awake/alert Behavior During Therapy: Anxious;WFL for tasks assessed/performed Overall Cognitive Status: Impaired/Different from baseline Area of Impairment: Memory;Following commands;Safety/judgement;Problem  solving                     Memory: Decreased short-term memory Following Commands: Follows one step commands consistently;Follows one step commands with increased time Safety/Judgement: Decreased awareness of safety   Problem Solving: Difficulty sequencing;Decreased initiation;Requires verbal cues;Requires tactile cues;Slow processing     General Comments       Exercises Exercises: Other exercises Other Exercises Other Exercises: Pt educated re: OT role, DME recs, d/c recs, falls prevention, home/routines modifications, ECS, importance of OOB mobility for functional strengthening Other Exercises: LBD, toileting, sup<>sit x2, sit<>stand x2, SPT, ~5 side steps EOB   Shoulder Instructions      Home Living Family/patient expects to be discharged to:: Private residence Living Arrangements: Spouse/significant other Available Help at Discharge: Family;Available 24 hours/day (husband works from home ) Type of Home: House Home Access: Stairs to enter Technical brewer of Steps: 2 Entrance Stairs-Rails: None       Bathroom Shower/Tub: Chief Strategy Officer: Environmental consultant - 2 wheels;Cane - single point;Shower seat - built in   Additional Comments: Pt has 2 large dogs       Prior Functioning/Environment Level of Independence: Independent with assistive device(s)        Comments: Pt reports MOD I using SPC during day and RW for nighttime mobility. Independent for I/ADLs. Pt was seeeing OPPT following a L femur fx ~ 1year ago.         OT Problem List: Decreased strength;Decreased activity tolerance;Impaired balance (sitting and/or standing);Decreased safety awareness;Decreased cognition      OT Treatment/Interventions: Self-care/ADL training;Therapeutic exercise;Energy conservation;DME and/or AE instruction;Therapeutic activities;Balance training;Patient/family education    OT Goals(Current goals can be found in the care plan section) Acute Rehab OT  Goals Patient Stated Goal: To return to PLOF  OT Goal Formulation: With patient Time For Goal Achievement: 11/13/19 Potential to Achieve Goals: Good ADL Goals Pt Will Perform Lower Body Dressing: with modified independence;sitting/lateral leans (c LRAD PRN) Pt Will Transfer to Toilet: bedside commode;ambulating;with min assist Additional ADL Goal #1: Pt will Independently verbalize plan to implement x3 falls prevention strategies.  OT Frequency: Min 2X/week   Barriers to D/C: Inaccessible home environment          Co-evaluation              AM-PAC OT "6 Clicks" Daily Activity     Outcome Measure Help from another person eating meals?: None Help from another person taking care of personal grooming?: None Help from another person toileting, which includes using toliet, bedpan, or urinal?: A Little Help from another person bathing (including washing, rinsing, drying)?: A Lot Help from another person to put on and taking off regular upper body clothing?: A Little Help from another person to put on and taking off regular lower body clothing?: A Lot 6 Click Score: 18   End of Session Equipment Utilized During Treatment: Gait belt  Activity Tolerance: Patient tolerated treatment well Patient left: in bed;with call bell/phone within reach;with family/visitor present  OT Visit Diagnosis: Unsteadiness on feet (R26.81);Other abnormalities of gait and mobility (R26.89)                Time: 5956-3875 OT Time Calculation (min): 35 min Charges:  OT General Charges $OT Visit: 1  Visit OT Evaluation $OT Eval Moderate Complexity: 1 Mod OT Treatments $Self Care/Home Management : 23-37 mins  Dessie Coma, M.S. OTR/L  10/30/19, 1:30 PM

## 2019-10-30 NOTE — Consult Note (Signed)
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 Abuse, anemia, anorexia, asthma, stage III CKD, laxative abuse, recurrent UTI/interstitial cystitis, Raynaud's disease, ongoing tobacco abuse admitted for hyponatremia. spironolactone 25 mg daily was started 7/8  Goal of Therapy:  Electrolytes WNL  Plan:  Continue lactated ringers with 40 mEq/L KCl infusion at 100 mL/hr  Oral KCl 40 mEq x 2 in addition to IV KCl  F/u electrolytes in am 7/10  Dallie Piles ,PharmD Clinical Pharmacist 10/30/2019 7:09 AM

## 2019-10-30 NOTE — Progress Notes (Signed)
[address] [place], [place] [address] Phone [phone]. Fax [phone]  Date: 10/30/2019                  Patient Name:  Katherine Perez  MRN: [id number]  DOB: 1970-04-28  Age / Sex: 49 y.o., female         PCP: Patient, No Pcp Per                 Service Requesting Consult: IM/ Bonnielee Haff, MD                 Reason for Consult: ARF            History of Present Illness: Patient is a 49 y.o. female with medical problems of multiple medical problems, who was admitted to [facility] on 10/26/2019 for evaluation of confusion and dysarthria.   Presented for dysarthryia and word finding difficulty. Fingers noted to be purple. Also concern of Shortness of breath and confusion. H/o laxative abuse. Ongoing alcohol abuse - 4-5 drinks per husband H/o colonic intertia, chronic constipation, pelvic floor dysfuncion, gastroparesis, malnutrition  + NSAIDs- diclonofenac-misoprostol started 09/25/2019  Mental status has improved today.  Patient is able to answer many questions appropriately. She is on clear liquid diet and tolerating.  Diet to be advanced today   Current medications: Current Facility-Administered Medications  Medication Dose Route Frequency Provider Last Rate Last Admin  . acetaminophen (TYLENOL) tablet 650 mg  650 mg Oral Q6H PRN Modena Jansky, MD   650 mg at 10/29/19 2346   Or  . acetaminophen (TYLENOL) suppository 650 mg  650 mg Rectal Q6H PRN Hongalgi, Anand D, MD      . albuterol (PROVENTIL) (2.5 MG/3ML) 0.083% nebulizer solution 2.5 mg  2.5 mg Nebulization Q2H PRN Hongalgi, Lenis Dickinson, MD      . Chlorhexidine Gluconate Cloth 2 % PADS 6 each  6 each Topical Q0600 Sharion Settler, NP   6 each at 10/30/19 0300  . famotidine (PEPCID) tablet 20 mg  20 mg Oral Daily Dallie Piles, RPH   20 mg at 10/30/19 8159  . fluconazole (DIFLUCAN) IVPB 100 mg  100 mg Intravenous Q24H Bonnielee Haff, MD   Stopped at 10/29/19 1943  . fluticasone (FLONASE) 50 MCG/ACT nasal  spray 2 spray  2 spray Each Nare BID Modena Jansky, MD   2 spray at 10/30/19 [phone]  . folic acid (FOLVITE) tablet 1 mg  1 mg Oral Daily Modena Jansky, MD   1 mg at 10/30/19 6151  . heparin injection 5,000 Units  5,000 Units Subcutaneous Q8H Modena Jansky, MD   5,000 Units at 10/30/19 0920  . lactated ringers 1,000 mL with potassium chloride 40 mEq infusion   Intravenous Continuous Bonnielee Haff, MD 100 mL/hr at 10/30/19 1133 Rate Verify at 10/30/19 1133  . meropenem (MERREM) 500 mg in sodium chloride 0.9 % 100 mL IVPB  500 mg Intravenous Q12H Dallie Piles, RPH   Stopped at 10/30/19 1003  . mometasone-formoterol (DULERA) 100-5 MCG/ACT inhaler 2 puff  2 puff Inhalation BID Modena Jansky, MD   2 puff at 10/30/19 0900  . multivitamin with minerals tablet 1 tablet  1 tablet Oral Daily Modena Jansky, MD   1 tablet at 10/30/19 0923  . nicotine (NICODERM CQ - dosed in mg/24 hours) patch 21 mg  21 mg Transdermal Daily Modena Jansky, MD   21 mg at 10/30/19 0925  . ondansetron (ZOFRAN) tablet 4 mg  4 mg Oral Q6H PRN Hongalgi, Lenis Dickinson, MD       Or  . ondansetron (ZOFRAN) injection 4 mg  4 mg Intravenous Q6H PRN Hongalgi, Anand D, MD      . pantoprazole (PROTONIX) EC tablet 40 mg  40 mg Oral Daily Modena Jansky, MD   40 mg at 10/30/19 5277  . Plecanatide TABS 3 mg  3 mg Oral Q0600 Hongalgi, Anand D, MD      . potassium chloride SA (KLOR-CON) CR tablet 40 mEq  40 mEq Oral BID Dallie Piles, RPH   40 mEq at 10/30/19 8242  . sodium chloride flush (NS) 0.9 % injection 10-40 mL  10-40 mL Intracatheter Q12H Hongalgi, Lenis Dickinson, MD   10 mL at 10/30/19 0925  . sodium chloride flush (NS) 0.9 % injection 10-40 mL  10-40 mL Intracatheter PRN Hongalgi, Anand D, MD      . sodium chloride flush (NS) 0.9 % injection 3 mL  3 mL Intravenous Q12H Modena Jansky, MD   3 mL at 10/30/19 0925  . spironolactone (ALDACTONE) tablet 25 mg  25 mg Oral Daily Murlean Iba, MD   25 mg at 10/30/19 0923  .  tamsulosin (FLOMAX) capsule 0.4 mg  0.4 mg Oral Daily Modena Jansky, MD   0.4 mg at 10/30/19 3536  . thiamine tablet 100 mg  100 mg Oral Daily Vernell Leep D, MD   100 mg at 10/30/19 1443   Or  . thiamine (B-1) injection 100 mg  100 mg Intravenous Daily Modena Jansky, MD   100 mg at 10/27/19 0943  . white petrolatum (VASELINE) gel   Topical PRN Bonnielee Haff, MD   1 application at 15/40/08 0936      Vital Signs: Blood pressure (!) 133/96, pulse (!) 123, temperature 98.4 F (36.9 C), temperature source Oral, resp. rate (!) 22, height 5\' 9"  (1.753 m), weight 57.1 kg, SpO2 97 %.   Intake/Output Summary (Last 24 hours) at 10/30/2019 1345 Last data filed at 10/30/2019 1133 Gross per 24 hour  Intake 2919.52 ml  Output 2500 ml  Net 419.52 ml    Weight trends: Filed Weights   10/27/19 0230 10/28/19 0155 10/29/19 0500  Weight: 57.4 kg 57.5 kg 57.1 kg    Physical Exam: General:  NAD  HEENT  moist oral mucus membranes  Neck:  supple, no JVD  Lungs: Coarse breath sounds b/l, room air  Heart::  regular  Abdomen: Soft, nontender  Extremities:  no edema  Neurologic:  Alert, oriented, able to follow commands  Skin: No acute rashes,    Lab results: Basic Metabolic Panel: Recent Labs  Lab 10/27/19 0545 10/27/19 0957 10/28/19 0654 10/29/19 0421 10/30/19 0510  NA 123*   < > 135 131* 130*  K 2.8*   < > 2.3* 2.6* 3.0*  CL 88*   < > 92* 90* 92*  CO2 10*   < > 25 24 26   GLUCOSE 93   < > 144* 100* 100*  BUN 138*   < > 119* 96* 82*  CREATININE 4.71*   < > 4.18* 3.84* 3.01*  CALCIUM 6.9*   < > 6.9* 7.4* 8.3*  MG 2.3  --  2.3 1.8 1.9  PHOS 7.2*  --  4.5 3.3  --    < > = values in this interval not displayed.    Liver Function Tests: Recent Labs  Lab 10/30/19 0510  AST 16  ALT 15  ALKPHOS 98  BILITOT 1.0  PROT 5.5*  ALBUMIN 1.9*   Recent Labs  Lab 10/26/19 1150  LIPASE 24   Recent Labs  Lab 10/26/19 1150  AMMONIA 16    CBC: Recent Labs  Lab  10/26/19 1150 10/27/19 0545 10/29/19 0421 10/30/19 0510  WBC 21.6*   < > 17.2* 15.6*  NEUTROABS 17.9*  --   --   --   HGB 12.3   < > 9.5* 10.2*  HCT 33.2*   < > 25.8* 27.6*  MCV 85.8   < > 84.9 84.7  PLT 267   < > 269 229   < > = values in this interval not displayed.    Cardiac Enzymes: No results for input(s): CKTOTAL, TROPONINI in the last 168 hours.  BNP: Invalid input(s): POCBNP  CBG: Recent Labs  Lab 10/28/19 1955 10/29/19 0355  GLUCAP 135* 109*    Microbiology: Recent Results (from the past 720 hour(s))  Group A Strep by PCR ([place] Only)     Status: None   Collection Time: 10/26/19  1:20 PM   Specimen: Throat; Sterile Swab  Result Value Ref Range Status   Group A Strep by PCR NOT DETECTED NOT DETECTED Final    Comment: Performed at [facility], [address] [place], [place] [address]  Urine culture     Status: Abnormal   Collection Time: 10/26/19  1:21 PM   Specimen: Urine, Random  Result Value Ref Range Status   Specimen Description   Final    URINE, RANDOM Performed at [facility], [address], [place], [place] [address]    Special Requests   Final    NONE Performed at [facility], [address] [place], [place] [address]    Culture (A)  Final    >=100,000 COLONIES/mL KLEBSIELLA PNEUMONIAE Confirmed Extended Spectrum Beta-Lactamase Producer (ESBL).  In bloodstream infections from ESBL organisms, carbapenems are preferred over piperacillin/tazobactam. They are shown to have a lower risk of mortality.    Report Status 10/29/2019 FINAL  Final   Organism ID, Bacteria KLEBSIELLA PNEUMONIAE (A)  Final      Susceptibility   Klebsiella pneumoniae - MIC*    AMPICILLIN >=32 RESISTANT Resistant     CEFAZOLIN >=64 RESISTANT Resistant     CEFTRIAXONE >=64 RESISTANT Resistant     CIPROFLOXACIN >=4 RESISTANT Resistant     GENTAMICIN <=1 SENSITIVE Sensitive     IMIPENEM <=0.25 SENSITIVE Sensitive     NITROFURANTOIN 64  INTERMEDIATE Intermediate     TRIMETH/SULFA >=320 RESISTANT Resistant     AMPICILLIN/SULBACTAM >=32 RESISTANT Resistant     PIP/TAZO 16 SENSITIVE Sensitive     * >=100,000 COLONIES/mL KLEBSIELLA PNEUMONIAE  SARS Coronavirus 2 by RT PCR (hospital order, performed in [place] hospital lab) Nasopharyngeal Nasopharyngeal Swab     Status: None   Collection Time: 10/26/19  1:40 PM   Specimen: Nasopharyngeal Swab  Result Value Ref Range Status   SARS Coronavirus 2 NEGATIVE NEGATIVE Final    Comment: (NOTE) SARS-CoV-2 target nucleic acids are NOT DETECTED.  The SARS-CoV-2 RNA is generally detectable in upper and lower respiratory specimens during the acute phase of infection. The lowest concentration of SARS-CoV-2 viral copies this assay can detect is 250 copies / mL. A negative result does not preclude SARS-CoV-2 infection and should not be used as the sole basis for treatment or other patient management decisions.  A negative result may occur with improper specimen collection / handling, submission of specimen other than  nasopharyngeal swab, presence of viral mutation(s) within the areas targeted by this assay, and inadequate number of viral copies (<250 copies / mL). A negative result must be combined with clinical observations, patient history, and epidemiological information.  Fact Sheet for Patients:   StrictlyIdeas.no  Fact Sheet for Healthcare Providers: BankingDealers.co.za  This test is not yet approved or  cleared by the [place] FDA and has been authorized for detection and/or diagnosis of SARS-CoV-2 by FDA under an Emergency Use Authorization (EUA).  This EUA will remain in effect (meaning this test can be used) for the duration of the COVID-19 declaration under Section 564(b)(1) of the Act, 21 U.S.C. section 360bbb-3(b)(1), unless the authorization is terminated or revoked sooner.  Performed at [facility],  [address] [place], [place] [address]   Culture, blood (routine x 2)     Status: None (Preliminary result)   Collection Time: 10/26/19  1:41 PM   Specimen: BLOOD  Result Value Ref Range Status   Specimen Description BLOOD RIGHT ANTECUBITAL  Final   Special Requests   Final    BOTTLES DRAWN AEROBIC AND ANAEROBIC Blood Culture adequate volume   Culture  Setup Time   Final    GRAM NEGATIVE RODS AEROBIC BOTTLE ONLY Performed at [facility], [address] [place], [place] [address]    Culture GRAM NEGATIVE RODS  Final   Report Status PENDING  Incomplete  Culture, blood (routine x 2)     Status: Abnormal (Preliminary result)   Collection Time: 10/26/19  1:41 PM   Specimen: BLOOD  Result Value Ref Range Status   Specimen Description   Final    BLOOD BLOOD LEFT FOREARM Performed at [facility], [address], [place], [place] [address]    Special Requests   Final    BOTTLES DRAWN AEROBIC AND ANAEROBIC Blood Culture adequate volume Performed at [facility], [address] [place],  [address]    Culture  Setup Time   Final    GRAM NEGATIVE RODS IN BOTH AEROBIC AND ANAEROBIC BOTTLES CRITICAL RESULT CALLED TO, READ BACK BY AND VERIFIED WITH: [place] 10/28/19 SDR    Culture (A)  Final    KLEBSIELLA PNEUMONIAE SUSCEPTIBILITIES TO FOLLOW REPEATING Performed at [facility], [place] [address], [place],  [address]    Report Status PENDING  Incomplete  Blood Culture ID Panel (Reflexed)     Status: Abnormal   Collection Time: 10/26/19  1:41 PM  Result Value Ref Range Status   Enterococcus species NOT DETECTED NOT DETECTED Final   Listeria monocytogenes NOT DETECTED NOT DETECTED Final   Staphylococcus species NOT DETECTED NOT DETECTED Final   Staphylococcus aureus (BCID) NOT DETECTED NOT DETECTED Final   Streptococcus species NOT DETECTED NOT DETECTED Final   Streptococcus agalactiae NOT DETECTED NOT DETECTED Final    Streptococcus pneumoniae NOT DETECTED NOT DETECTED Final   Streptococcus pyogenes NOT DETECTED NOT DETECTED Final   Acinetobacter baumannii NOT DETECTED NOT DETECTED Final   Enterobacteriaceae species DETECTED (A) NOT DETECTED Final    Comment: Enterobacteriaceae represent a large family of gram-negative bacteria, not a single organism. CRITICAL RESULT CALLED TO, READ BACK BY AND VERIFIED WITH:  SCOTT HALL AT 0559 10/28/19 SDR    Enterobacter cloacae complex NOT DETECTED NOT DETECTED Final   Escherichia coli NOT DETECTED NOT DETECTED Final   Klebsiella oxytoca NOT DETECTED NOT DETECTED Final   Klebsiella pneumoniae DETECTED (A) NOT DETECTED Final  Comment: CRITICAL RESULT CALLED TO, READ BACK BY AND VERIFIED WITH: SCOTT HALL AT 0559 10/28/19 SDR    Proteus species NOT DETECTED NOT DETECTED Final   Serratia marcescens NOT DETECTED NOT DETECTED Final   Carbapenem resistance NOT DETECTED NOT DETECTED Final   Haemophilus influenzae NOT DETECTED NOT DETECTED Final   Neisseria meningitidis NOT DETECTED NOT DETECTED Final   Pseudomonas aeruginosa NOT DETECTED NOT DETECTED Final   Candida albicans NOT DETECTED NOT DETECTED Final   Candida glabrata NOT DETECTED NOT DETECTED Final   Candida krusei NOT DETECTED NOT DETECTED Final   Candida parapsilosis NOT DETECTED NOT DETECTED Final   Candida tropicalis NOT DETECTED NOT DETECTED Final    Comment: Performed at [facility], [address] [place], [place] [address]  MRSA PCR Screening     Status: None   Collection Time: 10/27/19  2:34 AM   Specimen: Nasopharyngeal  Result Value Ref Range Status   MRSA by PCR NEGATIVE NEGATIVE Final    Comment:        The GeneXpert MRSA Assay (FDA approved for NASAL specimens only), is one component of a comprehensive MRSA colonization surveillance program. It is not intended to diagnose MRSA infection nor to guide or monitor treatment for MRSA infections. Performed at [facility],  [address] [place], [place] [address]      Coagulation Studies: No results for input(s): LABPROT, INR in the last 72 hours.  Urinalysis: No results for input(s): COLORURINE, LABSPEC, PHURINE, GLUCOSEU, HGBUR, BILIRUBINUR, KETONESUR, PROTEINUR, UROBILINOGEN, NITRITE, LEUKOCYTESUR in the last 72 hours.  Invalid input(s): APPERANCEUR      Imaging: US Abdomen Limited RUQ  Result Date: 10/29/2019 CLINICAL DATA:  Abdominal pain. EXAM: ULTRASOUND ABDOMEN LIMITED RIGHT UPPER QUADRANT COMPARISON:  CT a dated October 24, 2019 FINDINGS: Gallbladder: The sonographic Percell Miller sign is negative. There is gallbladder sludge without evidence for gallstones. The gallbladder is distended measuring approximately 10.9 x 5.4 x 5.8 cm. There is no gallbladder wall thickening or pericholecystic free fluid. Common bile duct: Diameter: 3 mm Liver: No focal lesion identified. Within normal limits in parenchymal echogenicity. Portal vein is patent on color Doppler imaging with normal direction of blood flow towards the liver. Other: None. IMPRESSION: Distended gallbladder with gallbladder sludge without evidence for acute cholecystitis. Electronically Signed   By: Constance Holster M.D.   On: 10/29/2019 18:53     Assessment & Plan: Pt is a 49 y.o. 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, pelvic floor dysfunction, acid reflux, gastroparesis, iron deficiency anemia, malnutrition, alcohol abuse, tobacco abuse, anemia, asthma, chronic kidney disease, laxative abuse, recurrent UTIs, interstitial cystitis, Raynaud's disease, was admitted on 10/26/2019 with Dehydration [E86.0] Hyponatremia [E87.1] ARF (acute renal failure) (HCC) [N17.9] AKI (acute kidney injury) ([place]) [N17.9] Urinary tract infection with hematuria, site unspecified [N39.0, R31.9]   # ARF # Hyponatremia # Substance abuse - alcohol # heavy smoker 1.5 ppd # severe acidosis # pyuria (chronic interstitial  cystitis) #Sepsis-Klebsiella -from urinary source-multidrug-resistant #Severe hypokalemia  ARF is likely multifactorial from volume depletion, sepsis, and NSAIDs leading to ATN No recent iv contrast exposure -Baseline Cr 1.40/ GFR 45 from 09/23/2019 -US renal: Debris seen within bladder.  Increased renal cortical echogenicity.  No hydronephrosis - gentle volume repletion -Hold NSAIDs, avoid hypotension  Lab Results  Component Value Date   CREATININE 3.01 (H) 10/30/2019   CREATININE 3.84 (H) 10/29/2019   CREATININE 4.18 (H) 10/28/2019   Serum creatinine trend is improving slowly UOP satisfactory  Electrolytes and volume status are acceptable.  No acute indication for dialysis.  Hyponatremia is multifactorial with contribution from volume depletion, alcohol abuse. Other ddx includes SIADH initially treated with 3% saline given at a low rate of 25 cc/h. Na improved to 135  -Sodium level stabilizing at 130-131 Patient's reports she drinks excessive amount of water at home ?psychogenic polydypsia Alcohol withdrawal precautions as per ICU team Possible aspiration pneumonia treatment as per ICU Reasonable Fluid restriction to 1200-1500 cc /d  Sepsis With Enterobacter species and Klebsiella detected in the blood Greater than 100,000 Klebsiella in the urine Sepsis is likely from urinary source CT of the abdomen shows patchy bibasilar infiltrate consistent with multifocal pneumonia.  Significantly distended gallbladder.  Thickened colon wall consistent with colitis  Hypokalemia Oral replacement  spironolactone    LOS: 4 Lexx [place] 7/9/20211:45 PM    Note: This note was prepared with Dragon dictation. Any transcription errors are unintentional

## 2019-10-31 LAB — BASIC METABOLIC PANEL
Anion gap: 13 (ref 5–15)
BUN: 69 mg/dL — ABNORMAL HIGH (ref 6–20)
CO2: 24 mmol/L (ref 22–32)
Calcium: 9.4 mg/dL (ref 8.9–10.3)
Chloride: 94 mmol/L — ABNORMAL LOW (ref 98–111)
Creatinine, Ser: 2.79 mg/dL — ABNORMAL HIGH (ref 0.44–1.00)
GFR calc Af Amer: 22 mL/min — ABNORMAL LOW (ref >60)
GFR calc non Af Amer: 19 mL/min — ABNORMAL LOW (ref >60)
Glucose, Bld: 110 mg/dL — ABNORMAL HIGH (ref 70–99)
Potassium: 5.3 mmol/L — ABNORMAL HIGH (ref 3.5–5.1)
Sodium: 131 mmol/L — ABNORMAL LOW (ref 135–145)

## 2019-10-31 LAB — CULTURE, BLOOD (ROUTINE X 2)
Special Requests: ADEQUATE
Special Requests: ADEQUATE

## 2019-10-31 LAB — CBC
HCT: 32.8 % — ABNORMAL LOW (ref 36.0–46.0)
Hemoglobin: 11.4 g/dL — ABNORMAL LOW (ref 12.0–15.0)
MCH: 30.7 pg (ref 26.0–34.0)
MCHC: 34.8 g/dL (ref 30.0–36.0)
MCV: 88.4 fL (ref 80.0–100.0)
Platelets: 301 10*3/uL (ref 150–400)
RBC: 3.71 MIL/uL — ABNORMAL LOW (ref 3.87–5.11)
RDW: 12.9 % (ref 11.5–15.5)
WBC: 18.3 10*3/uL — ABNORMAL HIGH (ref 4.0–10.5)
nRBC: 0 % (ref 0.0–0.2)

## 2019-10-31 LAB — MAGNESIUM: Magnesium: 1.6 mg/dL — ABNORMAL LOW (ref 1.7–2.4)

## 2019-10-31 MED ORDER — MAGNESIUM SULFATE 2 GM/50ML IV SOLN
2.0000 g | Freq: Once | INTRAVENOUS | Status: AC
  Administered 2019-10-31: 2 g via INTRAVENOUS
  Filled 2019-10-31: qty 50

## 2019-10-31 MED ORDER — SODIUM CHLORIDE 0.9 % IV SOLN
INTRAVENOUS | Status: DC | PRN
  Administered 2019-10-31 – 2019-11-01 (×2): 250 mL via INTRAVENOUS

## 2019-10-31 MED ORDER — BISACODYL 5 MG PO TBEC
10.0000 mg | DELAYED_RELEASE_TABLET | Freq: Once | ORAL | Status: AC
  Administered 2019-10-31: 10 mg via ORAL
  Filled 2019-10-31: qty 2

## 2019-10-31 MED ORDER — DOCUSATE SODIUM 100 MG PO CAPS
100.0000 mg | ORAL_CAPSULE | Freq: Two times a day (BID) | ORAL | Status: DC
  Administered 2019-10-31 – 2019-11-03 (×7): 100 mg via ORAL
  Filled 2019-10-31 (×8): qty 1

## 2019-10-31 NOTE — Evaluation (Signed)
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  Abuse, anemia, anorexia, asthma, stage III CKD, laxative abuse, recurrent UTI/interstitial cystitis, Raynaud's disease, ongoing tobacco abuse presented to [facility] ED with poor appetite, decreased energy lethargy and confusion.  Admitted for management of acute metabolic encephalopathy due to hyponatremia (and other electrolyte/lab abnormalities), AKI and UTI.  Clinical Impression  Upon evaluation, patient alert and oriented to self, location; follows simple commands, but does display moderate deficits in STM, processing and overall problem-solving.  Bilat UE/LE strength and ROM grossly symmetrical; generally weak and deconditioned due to acute hospitalization and extended medical history.  Currently able to complete bed mobility with mod indep; sit/stand, basic transfers and gait (120') with RW, min assist.  Demonstrates step to gait pattern, slightly antalgic due to L hip weakness; decreased DF/stability of R ankle (sensory deficits?) with mild steppage gait pattern.  Significant abdominal flaring with erect posture; compensates with forward flexed posture, heavy WBing bilat UEs.  Signficant genu recuvatum bilat in loading phases of gait cycle; poor balance, all planes.  Recommend use of RW and +1 at all times Would benefit from skilled PT to address above deficits and promote optimal return to PLOF.; Recommend transition to HHPT upon discharge from acute  hospitalization.     Follow Up Recommendations Home health PT    Equipment Recommendations   (has necessary equipment in home)    Recommendations for Other Services       Precautions / Restrictions Precautions Precautions: Fall Restrictions Weight Bearing Restrictions: No      Mobility  Bed Mobility Overal bed mobility: Needs Assistance Bed Mobility: Supine to Sit Rolling: Modified independent (Device/Increase time)            Transfers Overall transfer level: Needs assistance Equipment used: Rolling walker (2 wheeled) Transfers: Sit to/from Stand Sit to Stand: Min assist         General transfer comment: cuing for hand placement, does require use of UEs to complete; poor eccentric control with stand to sit  Ambulation/Gait Ambulation/Gait assistance: Min assist Gait Distance (Feet): 120 Feet Assistive device: Rolling walker (2 wheeled)       General Gait Details: step to gait pattern, slightly antalgic due to L hip weakness; decreased DF/stability of R ankle (sensory deficits?) with mild steppage gait pattern.  Significant abdominal flaring with erect posture; compensates with forward flexed posture, heavy WBing bilat UEs.  Signficant genu recuvatum bilat in loading phases of gait cycle; poor balance, all planes.  Recommend use of RW and +1 at all times  Stairs            Wheelchair Mobility    Modified Rankin (Stroke Patients Only)       Balance Overall balance assessment: Needs assistance Sitting-balance support: Feet supported;No upper extremity supported Sitting balance-Leahy Scale: Good     Standing balance support: Bilateral upper extremity supported Standing balance-Leahy Scale: Poor                               Pertinent Vitals/Pain Pain Assessment: No/denies pain    Home  Living Family/patient expects to be discharged to:: Private residence Living Arrangements: Spouse/significant other Available Help at Discharge:  Family;Available 24 hours/day Type of Home: House Home Access: Stairs to enter Entrance Stairs-Rails: None Entrance Stairs-Number of Steps: 2   Home Equipment: [place] - 2 wheels;Cane - single point;Shower seat - built in Additional Comments: Pt has 2 large dogs     Prior Function Level of Independence: Independent with assistive device(s)         Comments: Pt reports MOD I using SPC during day and RW for nighttime mobility. Independent for I/ADLs. Pt was seeeing OPPT following a L femur fx ~ 1year ago.      Hand Dominance   Dominant Hand: Right    Extremity/Trunk Assessment   Upper Extremity Assessment Upper Extremity Assessment: Overall WFL for tasks assessed (grossly at least 4/5 throughout)    Lower Extremity Assessment Lower Extremity Assessment: Generalized weakness (grossly at least 4-/5 throughout; significant genu recurvatum in closed-chain, standing position. Baseline neuropathy bilat feet)    Cervical / Trunk Assessment Cervical / Trunk Assessment:  (significant abdominal flaring, truncal weakness in standing)  Communication   Communication: No difficulties  Cognition Arousal/Alertness: Awake/alert   Overall Cognitive Status: Impaired/Different from baseline Area of Impairment: Memory;Following commands;Safety/judgement;Problem solving                     Memory: Decreased short-term memory Following Commands: Follows one step commands consistently;Follows one step commands with increased time Safety/Judgement: Decreased awareness of safety   Problem Solving: Difficulty sequencing;Decreased initiation;Requires verbal cues;Requires tactile cues;Slow processing        General Comments      Exercises Other Exercises Other Exercises: Educated re: role of PT and progressive mobility in acute setting; reviewed seated LE therex for use as  HEP (ankle pumps, LAQs, marching); encouraged progresive mobility in room and on unit with RW and staff assist as  appropriate (with discontinuation of purewick).  Patient/husband voiced understanding of all information.   Assessment/Plan    PT Assessment Patient needs continued PT services  PT Problem List Decreased strength;Decreased activity tolerance;Decreased range of motion;Decreased balance;Decreased mobility;Decreased coordination;Decreased cognition;Decreased knowledge of use of DME;Decreased safety awareness;Decreased knowledge of precautions       PT Treatment Interventions DME instruction;Gait training;Stair training;Functional mobility training;Therapeutic activities;Therapeutic exercise;Balance training;Cognitive remediation;Patient/family education    PT Goals (Current goals can be found in the Care Plan section)  Acute Rehab PT Goals Patient Stated Goal: to return home with husband PT Goal Formulation: With patient/family Time For Goal Achievement: 11/14/19 Potential to Achieve Goals: Fair    Frequency Min 2X/week   Barriers to discharge        Co-evaluation               AM-PAC PT "6 Clicks" Mobility  Outcome Measure Help needed turning from your back to your side while in a flat bed without using bedrails?: None Help needed moving from lying on your back to sitting on the side of a flat bed without using bedrails?: None Help needed moving to and from a bed to a chair (including a wheelchair)?: A Little Help needed standing up from a chair using your arms (e.g., wheelchair or bedside chair)?: A Little Help needed to walk in hospital room?: A Little Help needed climbing 3-5 steps with a railing? : A Lot 6 Click Score: 19    End of Session Equipment Utilized During Treatment: Gait belt Activity Tolerance: Patient tolerated treatment well Patient left: in chair;with  call bell/phone within reach;with chair alarm set;with family/visitor present Nurse Communication: Mobility status PT Visit Diagnosis: Muscle weakness (generalized) (M62.81);Difficulty in walking, not  elsewhere classified (R26.2)    Time: 8003-4917 PT Time Calculation (min) (ACUTE ONLY): 37 min   Charges:   PT Evaluation $PT Eval Moderate Complexity: 1 Mod PT Treatments $Therapeutic Activity: 8-22 mins        Caelan Branden H. Owens Shark, PT, DPT, NCS 10/31/19, 11:28 AM [phone]

## 2019-10-31 NOTE — Progress Notes (Signed)
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  Abuse, anemia, anorexia, asthma, stage III CKD, laxative abuse, recurrent UTI/interstitial cystitis, Raynaud's disease, ongoing tobacco abuse presented to [facility] ED with poor appetite, decreased energy lethargy and confusion.  She was found to have severe hyponatremia, urine concerning for infection chest x-ray also concerning for infection.  She was hospitalized for further management.    Reason for Visit: Hyponatremia.  Acute kidney injury  Consultants:  Nephrology.   Critical care medicine. Infectious disease General surgery  Procedures: None yet  Antibiotics: Anti-infectives (From admission, onward)   Start     Dose/Rate Route Frequency Ordered Stop   10/29/19 1000  meropenem (MERREM) 500 mg in sodium chloride 0.9 % 100 mL IVPB     Discontinue     500 mg 200 mL/hr over 30 Minutes Intravenous Every 12 hours 10/29/19 0801     10/28/19 1400  metroNIDAZOLE (FLAGYL) IVPB 500 mg  Status:  Discontinued        500 mg 100 mL/hr over 60 Minutes Intravenous Every 8 hours 10/28/19 1138 10/29/19 0755   10/28/19 0900  cefTRIAXone (ROCEPHIN) 2 g in sodium chloride 0.9 % 100 mL IVPB  Status:  Discontinued        2 g 200 mL/hr over 30 Minutes Intravenous Every 24 hours 10/28/19 0634 10/29/19 0755   10/26/19 2200  ceFEPIme (MAXIPIME) 2 g in sodium chloride 0.9 % 100 mL IVPB  Status:  Discontinued        2 g 200 mL/hr over 30 Minutes Intravenous Every 24  hours 10/26/19 1708 10/26/19 1751   10/26/19 2000  Ampicillin-Sulbactam (UNASYN) 3 g in sodium chloride 0.9 % 100 mL IVPB  Status:  Discontinued        3 g 200 mL/hr over 30 Minutes Intravenous Every 12 hours 10/26/19 1817 10/28/19 1138   10/26/19 1900  fluconazole (DIFLUCAN) IVPB 100 mg     Discontinue     100 mg 50 mL/hr over 60 Minutes Intravenous Every 24 hours 10/26/19 1757 11/02/19 1859   10/26/19 1315  ceFEPIme (MAXIPIME) 1 g in sodium chloride 0.9 % 100 mL IVPB        1 g 200 mL/hr over 30 Minutes Intravenous  Once 10/26/19 1308 10/26/19 1441      Subjective/Interval History: Patient denies any problems overnight.  Feeling much better in terms of her mentation.  Still feels fatigued at times.  No nausea vomiting.  Has not had a bowel movement in many days and is concerned about that due to her history of chronic constipation.      Assessment/Plan:  Hyponatremia Presented with a sodium of 118. Her sodium level was 134 on 6/2.    Likely multifactorial including dehydration, alcohol abuse.  Patient was given IV fluids with no improvement in sodium level.  Nephrology was consulted.  Urine osmolality noted to be 233.  Serum osmolality 295.  Patient briefly required 3% saline with improvement in sodium.   Sodium level has been stable.  She remains on fluid restriction as well.  Labs pending from this morning.  Nephrology continues to follow.  Acute metabolic encephalopathy Most likely due to combination of hyponatremia, infection, acidosis, alcohol withdrawal.  Mental status has improved.  She seems to be close to her baseline today.  Continue thiamine.  Reorient daily.  PT and OT evaluation.    Acute on chronic kidney disease stage III/hypokalemia Creatinine was 1.4 on June 2.  Presented with a creatinine of 4.88.  Most likely due to NSAID use and dehydration.   Patient was started on IV fluids.  Renal function has been improving.  Monitor urine output.  Labs are pending from this  morning.    Elevated anion gap metabolic acidosis Most likely due to acute kidney injury and hypovolemia.  Alcohol likely contributing as well.  She is not a diabetic.  Ketones noted in the urine however this could be due to starvation.  Metabolic acidosis has resolved.  Bicarbonate infusion was discontinued a few days ago.  Klebsiella bacteremia/UTI with ESBL Klebsiella/chronic interstitial cystitis/sepsis present on admission. Patient was initially on Unasyn for aspiration pneumonia.  Subsequently on ceftriaxone and metronidazole.  Her urine culture is growing ESBL Klebsiella.  She was changed over to meropenem.  She is also bacteremic with the same organism. Source of the infection is urine. CT scan of the abdomen pelvis did not show any acute issues in the GU tract however an enlarged gallbladder was noted.  Tiny bilateral nonobstructing renal calculi were seen.  Sepsis physiology has resolved. Patient being followed by infectious disease.  Plan is to give 10 days of IV antibiotics with last day being July 17.  Patient may need a PICC line if she is discharged before that.  Significantly distended gallbladder Significantly distended gallbladder was noted on the CT scan.  She was noted to have a abdominal tenderness.  Her confusion made it difficult to elicit history.  General surgery was consulted.  Ultrasound of the abdomen was done.  It does not appear that her gallbladder is an active issue currently.  Her LFTs are normal.  No plan for any surgical intervention currently.    Oral candidiasis On Diflucan per PCCM.  Will stop after 7-day course.  Suspected aspiration pneumonia Respiratory status seems to be stable.  Continue antibacterials.  Respiratory status is stable.  Leukocytosis Secondary to infection as well as reactive component.  WBC is slowly improving.  Normocytic anemia Likely due to acute illness.  No evidence for overt bleeding.  Hemoglobin stable for the most part.  No  evidence of overt bleeding.  Chronic constipation and anorexia Bowel regimen to continue.  CT scan also showed findings suggestive of colitis.  Patient apparently has chronic GI issues with chronic constipation.  She is followed by gastroenterology in [place].  Here in the hospital patient does not have any diarrhea.  No other GI symptoms or signs noted.  No further work-up at this time.  Discussed with her husband.  She can follow-up with her gastroenterologist at [facility]. Is concerned that she has not had a bowel movement in many days.  She was n.p.o. for the initial 3 days of her hospitalization which could be contributing.  From her gastroenterology reviewed.  It appears that patient was taking multiple over-the-counter medication regimen.  No laxatives mention on the GI note.  She is noted to be on metoclopramide.  Patient apparently takes bisacodyl primarily.  We will reinitiate this.  History of asthma Patient noted to be on inhalers.  Also noted to be on theophylline.  Holding it for now.  Theophylline level is 17.8.  Tobacco abuse Nicotine patch  History of alcohol abuse Continue CIWA protocol.  Prolonged QTC Avoid QT prolonging medications.  Continue to correct electrolytes.  EKG has not been done despite multiple orders.  Will reorder again today.   DVT Prophylaxis: Subcutaneous heparin Code Status: Full code Family Communication: No family at bedside. Disposition Plan:  Status is: Inpatient  Remains inpatient appropriate because:Persistent severe electrolyte disturbances and Altered mental status   Dispo:  Patient From: Home  Planned Disposition: To be determined  Expected discharge date: 11/02/19  Medically stable for discharge: No      Medications:  Scheduled: . Chlorhexidine Gluconate Cloth  6 each Topical Q0600  . famotidine  20 mg Oral Daily  . fluticasone  2 spray Each Nare BID  . folic acid  1 mg Oral Daily  . heparin  5,000 Units Subcutaneous Q8H  .  mometasone-formoterol  2 puff Inhalation BID  . multivitamin with minerals  1 tablet Oral Daily  . nicotine  21 mg Transdermal Daily  . pantoprazole  40 mg Oral Daily  . Plecanatide  3 mg Oral Q0600  . pneumococcal 23 valent vaccine  0.5 mL Intramuscular Tomorrow-1000  . sodium chloride flush  10-40 mL Intracatheter Q12H  . sodium chloride flush  3 mL Intravenous Q12H  . spironolactone  25 mg Oral Daily  . tamsulosin  0.4 mg Oral Daily  . thiamine  100 mg Oral Daily   Or  . thiamine  100 mg Intravenous Daily   Continuous: . fluconazole (DIFLUCAN) IV 100 mg (10/30/19 2031)  . lactated ringers with kcl 50 mL/hr at 10/30/19 1753  . meropenem (MERREM) IV 500 mg (10/30/19 2145)   IRS:[id number] **OR** acetaminophen, albuterol, ondansetron **OR** ondansetron (ZOFRAN) IV, sodium chloride flush, white petrolatum   Objective:  Vital Signs  Vitals:   10/30/19 2021 10/31/19 0038 10/31/19 0040 10/31/19 0520  BP: 136/78 (!) 152/90 (!) 140/93 (!) 159/97  Pulse: 96 (!) 102 (!) 107 (!) 105  Resp: 18 16  16   Temp: 99.1 F (37.3 C) 98.5 F (36.9 C)  98.1 F (36.7 C)  TempSrc: Oral Oral  Oral  SpO2: 98% 96% 97% 100%  Weight:      Height:        Intake/Output Summary (Last 24 hours) at 10/31/2019 0935 Last data filed at 10/31/2019 0500 Gross per 24 hour  Intake 1176.76 ml  Output 2900 ml  Net -1723.24 ml   Filed Weights   10/27/19 0230 10/28/19 0155 10/29/19 0500  Weight: 57.4 kg 57.5 kg 57.1 kg   General appearance: Much more lucid today.  Awake alert.  In no distress.   Resp: Clear to auscultation bilaterally.  Normal effort Cardio: S1-S2 is normal regular.  No S3-S4.  No rubs murmurs or bruit GI: Abdomen is soft.  Nontender nondistended.  Bowel sounds are present normal.  No masses organomegaly Extremities: No edema.  Full range of motion of lower extremities. Neurologic: Alert.  Oriented to year or month place person.  No focal neurological deficits.      Lab  Results:  Data Reviewed: I have personally reviewed following labs and imaging studies  CBC: Recent Labs  Lab 10/26/19 1150 10/27/19 0545 10/28/19 0654 10/29/19 0421 10/30/19 0510  WBC 21.6* 17.6* 19.5* 17.2* 15.6*  NEUTROABS 17.9*  --   --   --   --   HGB 12.3 10.1* 10.4* 9.5* 10.2*  HCT 33.2* 27.3* 27.8* 25.8* 27.6*  MCV 85.8 84.8 83.2 84.9 84.7  PLT  267 228 231 269 208    Basic Metabolic Panel: Recent Labs  Lab 10/26/19 1150 10/26/19 1303 10/27/19 0545 10/27/19 0545 10/27/19 0957 10/27/19 1601 10/27/19 2058 10/27/19 2329 10/28/19 0654 10/29/19 0421 10/30/19 0510  NA 118*   < > 123*   < > 125*   < > 131* 133* 135 131* 130*  K 3.8   < > 2.8*  --  2.4*  --   --   --  2.3* 2.6* 3.0*  CL 86*   < > 88*  --  89*  --   --   --  92* 90* 92*  CO2 7*   < > 10*  --  12*  --   --   --  25 24 26   GLUCOSE 86   < > 93  --  110*  --   --   --  144* 100* 100*  BUN 137*   < > 138*  --  134*  --   --   --  119* 96* 82*  CREATININE 4.88*   < > 4.71*  --  4.58*  --   --   --  4.18* 3.84* 3.01*  CALCIUM 8.1*   < > 6.9*  --  6.6*  --   --   --  6.9* 7.4* 8.3*  MG 3.0*  --  2.3  --   --   --   --   --  2.3 1.8 1.9  PHOS  --   --  7.2*  --   --   --   --   --  4.5 3.3  --    < > = values in this interval not displayed.    GFR: Estimated Creatinine Clearance: 20.6 mL/min (A) (by C-G formula based on SCr of 3.01 mg/dL (H)).  Liver Function Tests: Recent Labs  Lab 10/26/19 1150 10/27/19 0545 10/29/19 0421 10/30/19 0510  AST 39 37 21 16  ALT 31 24 18 15   ALKPHOS 177* 144* 120 98  BILITOT 1.6* 1.8* 1.2 1.0  PROT 7.4 5.9* 5.8* 5.5*  ALBUMIN 2.9* 2.2* 1.9* 1.9*    Recent Labs  Lab 10/26/19 1150  LIPASE 24   Recent Labs  Lab 10/26/19 1150  AMMONIA 16     Recent Results (from the past 240 hour(s))  Group A Strep by PCR ([facility] Only)     Status: None   Collection Time: 10/26/19  1:20 PM   Specimen: Throat; Sterile Swab  Result Value Ref Range Status   Group A Strep by  PCR NOT DETECTED NOT DETECTED Final    Comment: Performed at [facility], [address], [place], [place] [address]  Urine culture     Status: Abnormal   Collection Time: 10/26/19  1:21 PM   Specimen: Urine, Random  Result Value Ref Range Status   Specimen Description   Final    URINE, RANDOM Performed at [facility], [address] [place], [place] [address]    Special Requests   Final    NONE Performed at [facility], [address] [place], [place] [address]    Culture (A)  Final    >=100,000 COLONIES/mL KLEBSIELLA PNEUMONIAE Confirmed Extended Spectrum Beta-Lactamase Producer (ESBL).  In bloodstream infections from ESBL organisms, carbapenems are preferred over piperacillin/tazobactam. They are shown to have a lower risk of mortality.    Report Status 10/29/2019 FINAL  Final   Organism ID, Bacteria KLEBSIELLA PNEUMONIAE (A)  Final  Susceptibility   Klebsiella pneumoniae - MIC*    AMPICILLIN >=32 RESISTANT Resistant     CEFAZOLIN >=64 RESISTANT Resistant     CEFTRIAXONE >=64 RESISTANT Resistant     CIPROFLOXACIN >=4 RESISTANT Resistant     GENTAMICIN <=1 SENSITIVE Sensitive     IMIPENEM <=0.25 SENSITIVE Sensitive     NITROFURANTOIN 64 INTERMEDIATE Intermediate     TRIMETH/SULFA >=320 RESISTANT Resistant     AMPICILLIN/SULBACTAM >=32 RESISTANT Resistant     PIP/TAZO 16 SENSITIVE Sensitive     * >=100,000 COLONIES/mL KLEBSIELLA PNEUMONIAE  SARS Coronavirus 2 by RT PCR (hospital order, performed in [place] hospital lab) Nasopharyngeal Nasopharyngeal Swab     Status: None   Collection Time: 10/26/19  1:40 PM   Specimen: Nasopharyngeal Swab  Result Value Ref Range Status   SARS Coronavirus 2 NEGATIVE NEGATIVE Final    Comment: (NOTE) SARS-CoV-2 target nucleic acids are NOT DETECTED.  The SARS-CoV-2 RNA is generally detectable in upper and lower respiratory specimens during the acute phase of infection. The lowest concentration of  SARS-CoV-2 viral copies this assay can detect is 250 copies / mL. A negative result does not preclude SARS-CoV-2 infection and should not be used as the sole basis for treatment or other patient management decisions.  A negative result may occur with improper specimen collection / handling, submission of specimen other than nasopharyngeal swab, presence of viral mutation(s) within the areas targeted by this assay, and inadequate number of viral copies (<250 copies / mL). A negative result must be combined with clinical observations, patient history, and epidemiological information.  Fact Sheet for Patients:   StrictlyIdeas.no  Fact Sheet for Healthcare Providers: BankingDealers.co.za  This test is not yet approved or  cleared by the [place] FDA and has been authorized for detection and/or diagnosis of SARS-CoV-2 by FDA under an Emergency Use Authorization (EUA).  This EUA will remain in effect (meaning this test can be used) for the duration of the COVID-19 declaration under Section 564(b)(1) of the Act, 21 U.S.C. section 360bbb-3(b)(1), unless the authorization is terminated or revoked sooner.  Performed at [facility], [address] [place], [place] [address]   Culture, blood (routine x 2)     Status: Abnormal   Collection Time: 10/26/19  1:41 PM   Specimen: BLOOD  Result Value Ref Range Status   Specimen Description   Final    BLOOD RIGHT ANTECUBITAL Performed at [facility], [address], [place], [place] [address]    Special Requests   Final    BOTTLES DRAWN AEROBIC AND ANAEROBIC Blood Culture adequate volume Performed at [facility], [address] [place], [place] [address]    Culture  Setup Time   Final    GRAM NEGATIVE RODS AEROBIC BOTTLE ONLY CRITICAL VALUE NOTED.  VALUE IS CONSISTENT WITH PREVIOUSLY REPORTED AND CALLED VALUE.    Culture (A)  Final    KLEBSIELLA  PNEUMONIAE SUSCEPTIBILITIES PERFORMED ON PREVIOUS CULTURE WITHIN THE LAST 5 DAYS. Performed at [facility], [place] [address] [place], [place] [address]    Report Status 10/31/2019 FINAL  Final  Culture, blood (routine x 2)     Status: Abnormal   Collection Time: 10/26/19  1:41 PM   Specimen: BLOOD  Result Value Ref Range Status   Specimen Description   Final    BLOOD BLOOD LEFT FOREARM Performed at [facility], [address], [place], [place] [address]    Special Requests   Final  BOTTLES DRAWN AEROBIC AND ANAEROBIC Blood Culture adequate volume Performed at [facility], [address] [place], [place] [address]    Culture  Setup Time   Final    GRAM NEGATIVE RODS IN BOTH AEROBIC AND ANAEROBIC BOTTLES CRITICAL RESULT CALLED TO, READ BACK BY AND VERIFIED WITH: [place] 10/28/19 SDR Performed at [facility], [place] [address], [place], [place] [address]    Culture (A)  Final    KLEBSIELLA PNEUMONIAE Confirmed Extended Spectrum Beta-Lactamase Producer (ESBL).  In bloodstream infections from ESBL organisms, carbapenems are preferred over piperacillin/tazobactam. They are shown to have a lower risk of mortality.    Report Status 10/31/2019 FINAL  Final   Organism ID, Bacteria KLEBSIELLA PNEUMONIAE  Final      Susceptibility   Klebsiella pneumoniae - MIC*    AMPICILLIN >=32 RESISTANT Resistant     CEFAZOLIN >=64 RESISTANT Resistant     CEFEPIME >=32 RESISTANT Resistant     CEFTAZIDIME RESISTANT Resistant     CEFTRIAXONE >=64 RESISTANT Resistant     CIPROFLOXACIN >=4 RESISTANT Resistant     GENTAMICIN <=1 SENSITIVE Sensitive     IMIPENEM <=0.25 SENSITIVE Sensitive     TRIMETH/SULFA >=320 RESISTANT Resistant     AMPICILLIN/SULBACTAM >=32 RESISTANT Resistant     PIP/TAZO 8 SENSITIVE Sensitive     * KLEBSIELLA PNEUMONIAE  Blood Culture ID Panel (Reflexed)     Status: Abnormal   Collection Time: 10/26/19  1:41 PM  Result Value Ref Range  Status   Enterococcus species NOT DETECTED NOT DETECTED Final   Listeria monocytogenes NOT DETECTED NOT DETECTED Final   Staphylococcus species NOT DETECTED NOT DETECTED Final   Staphylococcus aureus (BCID) NOT DETECTED NOT DETECTED Final   Streptococcus species NOT DETECTED NOT DETECTED Final   Streptococcus agalactiae NOT DETECTED NOT DETECTED Final   Streptococcus pneumoniae NOT DETECTED NOT DETECTED Final   Streptococcus pyogenes NOT DETECTED NOT DETECTED Final   Acinetobacter baumannii NOT DETECTED NOT DETECTED Final   Enterobacteriaceae species DETECTED (A) NOT DETECTED Final    Comment: Enterobacteriaceae represent a large family of gram-negative bacteria, not a single organism. CRITICAL RESULT CALLED TO, READ BACK BY AND VERIFIED WITH:  SCOTT HALL AT 0559 10/28/19 SDR    Enterobacter cloacae complex NOT DETECTED NOT DETECTED Final   Escherichia coli NOT DETECTED NOT DETECTED Final   Klebsiella oxytoca NOT DETECTED NOT DETECTED Final   Klebsiella pneumoniae DETECTED (A) NOT DETECTED Final    Comment: CRITICAL RESULT CALLED TO, READ BACK BY AND VERIFIED WITH: SCOTT HALL AT 0559 10/28/19 SDR    Proteus species NOT DETECTED NOT DETECTED Final   Serratia marcescens NOT DETECTED NOT DETECTED Final   Carbapenem resistance NOT DETECTED NOT DETECTED Final   Haemophilus influenzae NOT DETECTED NOT DETECTED Final   Neisseria meningitidis NOT DETECTED NOT DETECTED Final   Pseudomonas aeruginosa NOT DETECTED NOT DETECTED Final   Candida albicans NOT DETECTED NOT DETECTED Final   Candida glabrata NOT DETECTED NOT DETECTED Final   Candida krusei NOT DETECTED NOT DETECTED Final   Candida parapsilosis NOT DETECTED NOT DETECTED Final   Candida tropicalis NOT DETECTED NOT DETECTED Final    Comment: Performed at [facility], [address] [place], [place] [address]  MRSA PCR Screening     Status: None   Collection Time: 10/27/19  2:34 AM   Specimen: Nasopharyngeal  Result Value Ref  Range Status   MRSA by PCR NEGATIVE NEGATIVE Final  Comment:        The GeneXpert MRSA Assay (FDA approved for NASAL specimens only), is one component of a comprehensive MRSA colonization surveillance program. It is not intended to diagnose MRSA infection nor to guide or monitor treatment for MRSA infections. Performed at [facility], [address], [place], [place] [address]       Radiology Studies: US Abdomen Limited RUQ  Result Date: 10/29/2019 CLINICAL DATA:  Abdominal pain. EXAM: ULTRASOUND ABDOMEN LIMITED RIGHT UPPER QUADRANT COMPARISON:  CT a dated October 24, 2019 FINDINGS: Gallbladder: The sonographic Percell Miller sign is negative. There is gallbladder sludge without evidence for gallstones. The gallbladder is distended measuring approximately 10.9 x 5.4 x 5.8 cm. There is no gallbladder wall thickening or pericholecystic free fluid. Common bile duct: Diameter: 3 mm Liver: No focal lesion identified. Within normal limits in parenchymal echogenicity. Portal vein is patent on color Doppler imaging with normal direction of blood flow towards the liver. Other: None. IMPRESSION: Distended gallbladder with gallbladder sludge without evidence for acute cholecystitis. Electronically Signed   By: Constance Holster M.D.   On: 10/29/2019 18:53       LOS: 5 days   [facility] Pager on [email or web address]  10/31/2019, 9:35 AM

## 2019-10-31 NOTE — Consult Note (Signed)
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 Abuse, anemia, anorexia, asthma, stage III CKD, laxative abuse, recurrent UTI/interstitial cystitis, Raynaud's disease, ongoing tobacco abuse admitted for hyponatremia. spironolactone 25 mg daily was started 7/8  Goal of Therapy:  Electrolytes WNL  Plan:  lactated ringers with 40 mEq/L KCl infusion at 100 mL/hr: stopped by Dr Maryland Pink  2 gram IV magnesium sulfate x 1 per Dr Maryland Pink  F/u electrolytes in am 7/11  Katherine Perez ,PharmD Clinical Pharmacist 10/31/2019 11:49 AM

## 2019-10-31 NOTE — Progress Notes (Signed)
[facility]  ROUNDING NOTE   Subjective:  Patient sitting up in in chair today. Husband at the bedside as well. Patient requesting increase in free water intake. Explained that it would be important to adhere to the 1.5 L fluid restriction for now.   Objective:  Vital signs in last 24 hours:  Temp:  [98.1 F (36.7 C)-99.1 F (37.3 C)] 98.6 F (37 C) (07/10 1310) Pulse Rate:  [94-107] 95 (07/10 1310) Resp:  [13-19] 15 (07/10 1310) BP: (128-159)/(71-97) 128/71 (07/10 1310) SpO2:  [95 %-100 %] 100 % (07/10 1310)  Weight change:  Filed Weights   10/27/19 0230 10/28/19 0155 10/29/19 0500  Weight: 57.4 kg 57.5 kg 57.1 kg    Intake/Output: I/O last 3 completed shifts: In: 2356.6 [P.O.:240; I.V.:1866.5; IV Piggyback:250.1] Out: 4300 [Urine:4300]   Intake/Output this shift:  Total I/O In: -  Out: 900 [Urine:900]  Physical Exam: General: No acute distress  Head: Normocephalic, atraumatic. Moist oral mucosal membranes  Eyes: Anicteric  Neck: Supple, trachea midline  Lungs:  Clear to auscultation, normal effort  Heart: S1S2 no rubs  Abdomen:  Soft, nontender, bowel sounds present  Extremities: No peripheral edema.  Neurologic: Awake, alert, following commands  Skin: No lesions       Basic Metabolic Panel: Recent Labs  Lab 10/27/19 0545 10/27/19 0545 10/27/19 0957 10/27/19 0957 10/27/19 1601 10/27/19 2329 10/28/19 0654 10/28/19 0654 10/29/19 0421 10/30/19 0510 10/31/19 1057  NA 123*   < > 125*  --    < > 133* 135  --  131* 130* 131*  K 2.8*   < > 2.4*  --   --   --  2.3*  --  2.6* 3.0* 5.3*  CL 88*   < > 89*  --   --   --  92*  --  90* 92* 94*  CO2 10*   < > 12*  --   --   --  25  --  24 26 24   GLUCOSE 93   < > 110*  --   --   --  144*  --  100* 100* 110*  BUN 138*   < > 134*  --   --   --  119*  --  96* 82* 69*  CREATININE 4.71*   < > 4.58*  --   --   --  4.18*  --  3.84* 3.01* 2.79*  CALCIUM 6.9*   < > 6.6*   < >  --   --  6.9*   < > 7.4*  8.3* 9.4  MG 2.3  --   --   --   --   --  2.3  --  1.8 1.9 1.6*  PHOS 7.2*  --   --   --   --   --  4.5  --  3.3  --   --    < > = values in this interval not displayed.    Liver Function Tests: Recent Labs  Lab 10/26/19 1150 10/27/19 0545 10/29/19 0421 10/30/19 0510  AST 39 37 21 16  ALT 31 24 18 15   ALKPHOS 177* 144* 120 98  BILITOT 1.6* 1.8* 1.2 1.0  PROT 7.4 5.9* 5.8* 5.5*  ALBUMIN 2.9* 2.2* 1.9* 1.9*   Recent Labs  Lab 10/26/19 1150  LIPASE 24   Recent Labs  Lab 10/26/19 1150  AMMONIA 16    CBC: Recent Labs  Lab 10/26/19 1150 10/26/19 1150 10/27/19 0545 10/28/19 0654 10/29/19 0421  10/30/19 0510 10/31/19 1057  WBC 21.6*   < > 17.6* 19.5* 17.2* 15.6* 18.3*  NEUTROABS 17.9*  --   --   --   --   --   --   HGB 12.3   < > 10.1* 10.4* 9.5* 10.2* 11.4*  HCT 33.2*   < > 27.3* 27.8* 25.8* 27.6* 32.8*  MCV 85.8   < > 84.8 83.2 84.9 84.7 88.4  PLT 267   < > 228 231 269 229 301   < > = values in this interval not displayed.    Cardiac Enzymes: No results for input(s): CKTOTAL, CKMB, CKMBINDEX, TROPONINI in the last 168 hours.  BNP: Invalid input(s): POCBNP  CBG: Recent Labs  Lab 10/28/19 1955 10/29/19 0355  GLUCAP 135* 109*    Microbiology: Results for orders placed or performed during the hospital encounter of 10/26/19  Group A Strep by PCR ([facility] Only)     Status: None   Collection Time: 10/26/19  1:20 PM   Specimen: Throat; Sterile Swab  Result Value Ref Range Status   Group A Strep by PCR NOT DETECTED NOT DETECTED Final    Comment: Performed at [facility], [address], [place], [place] [address]  Urine culture     Status: Abnormal   Collection Time: 10/26/19  1:21 PM   Specimen: Urine, Random  Result Value Ref Range Status   Specimen Description   Final    URINE, RANDOM Performed at [facility], [address] [place], [place] [address]    Special Requests   Final    NONE Performed at [facility], [address] [place], [place] [address]    Culture (A)  Final    >=100,000 COLONIES/mL KLEBSIELLA PNEUMONIAE Confirmed Extended Spectrum Beta-Lactamase Producer (ESBL).  In bloodstream infections from ESBL organisms, carbapenems are preferred over piperacillin/tazobactam. They are shown to have a lower risk of mortality.    Report Status 10/29/2019 FINAL  Final   Organism ID, Bacteria KLEBSIELLA PNEUMONIAE (A)  Final      Susceptibility   Klebsiella pneumoniae - MIC*    AMPICILLIN >=32 RESISTANT Resistant     CEFAZOLIN >=64 RESISTANT Resistant     CEFTRIAXONE >=64 RESISTANT Resistant     CIPROFLOXACIN >=4 RESISTANT Resistant     GENTAMICIN <=1 SENSITIVE Sensitive     IMIPENEM <=0.25 SENSITIVE Sensitive     NITROFURANTOIN 64 INTERMEDIATE Intermediate     TRIMETH/SULFA >=320 RESISTANT Resistant     AMPICILLIN/SULBACTAM >=32 RESISTANT Resistant     PIP/TAZO 16 SENSITIVE Sensitive     * >=100,000 COLONIES/mL KLEBSIELLA PNEUMONIAE  SARS Coronavirus 2 by RT PCR (hospital order, performed in [place] hospital lab) Nasopharyngeal Nasopharyngeal Swab     Status: None   Collection Time: 10/26/19  1:40 PM   Specimen: Nasopharyngeal Swab  Result Value Ref Range Status   SARS Coronavirus 2 NEGATIVE NEGATIVE Final    Comment: (NOTE) SARS-CoV-2 target nucleic acids are NOT DETECTED.  The SARS-CoV-2 RNA is generally detectable in upper and lower respiratory specimens during the acute phase of infection. The lowest concentration of SARS-CoV-2 viral copies this assay can detect is 250 copies / mL. A negative result does not preclude SARS-CoV-2 infection and should not be used as the sole basis for treatment or other patient management decisions.  A negative result may occur with improper specimen collection / handling, submission of specimen other than nasopharyngeal swab, presence of viral mutation(s) within the areas targeted by this  assay, and inadequate number of viral copies (<250 copies /  mL). A negative result must be combined with clinical observations, patient history, and epidemiological information.  Fact Sheet for Patients:   StrictlyIdeas.no  Fact Sheet for Healthcare Providers: BankingDealers.co.za  This test is not yet approved or  cleared by the [place] FDA and has been authorized for detection and/or diagnosis of SARS-CoV-2 by FDA under an Emergency Use Authorization (EUA).  This EUA will remain in effect (meaning this test can be used) for the duration of the COVID-19 declaration under Section 564(b)(1) of the Act, 21 U.S.C. section 360bbb-3(b)(1), unless the authorization is terminated or revoked sooner.  Performed at [facility], [address] [place], [place] [address]   Culture, blood (routine x 2)     Status: Abnormal   Collection Time: 10/26/19  1:41 PM   Specimen: BLOOD  Result Value Ref Range Status   Specimen Description   Final    BLOOD RIGHT ANTECUBITAL Performed at [facility], [address] [place], [place] [address]    Special Requests   Final    BOTTLES DRAWN AEROBIC AND ANAEROBIC Blood Culture adequate volume Performed at [facility], [address], [place], [place] [address]    Culture  Setup Time   Final    GRAM NEGATIVE RODS AEROBIC BOTTLE ONLY CRITICAL VALUE NOTED.  VALUE IS CONSISTENT WITH PREVIOUSLY REPORTED AND CALLED VALUE.    Culture (A)  Final    KLEBSIELLA PNEUMONIAE SUSCEPTIBILITIES PERFORMED ON PREVIOUS CULTURE WITHIN THE LAST 5 DAYS. Performed at [facility], [place] [address], [place], [place] [address]    Report Status 10/31/2019 FINAL  Final  Culture, blood (routine x 2)     Status: Abnormal   Collection Time: 10/26/19  1:41 PM   Specimen: BLOOD  Result Value Ref Range Status   Specimen Description   Final    BLOOD BLOOD LEFT FOREARM Performed at [facility], [address], [place], [place] [address]     Special Requests   Final    BOTTLES DRAWN AEROBIC AND ANAEROBIC Blood Culture adequate volume Performed at [facility], [address], [place], [place] [address]    Culture  Setup Time   Final    GRAM NEGATIVE RODS IN BOTH AEROBIC AND ANAEROBIC BOTTLES CRITICAL RESULT CALLED TO, READ BACK BY AND VERIFIED WITH: [place] 10/28/19 SDR Performed at [facility], [place] [address], [place],  [address]    Culture (A)  Final    KLEBSIELLA PNEUMONIAE Confirmed Extended Spectrum Beta-Lactamase Producer (ESBL).  In bloodstream infections from ESBL organisms, carbapenems are preferred over piperacillin/tazobactam. They are shown to have a lower risk of mortality.    Report Status 10/31/2019 FINAL  Final   Organism ID, Bacteria KLEBSIELLA PNEUMONIAE  Final      Susceptibility   Klebsiella pneumoniae - MIC*    AMPICILLIN >=32 RESISTANT Resistant     CEFAZOLIN >=64 RESISTANT Resistant     CEFEPIME >=32 RESISTANT Resistant     CEFTAZIDIME RESISTANT Resistant     CEFTRIAXONE >=64 RESISTANT Resistant     CIPROFLOXACIN >=4 RESISTANT Resistant     GENTAMICIN <=1 SENSITIVE Sensitive     IMIPENEM <=0.25 SENSITIVE Sensitive     TRIMETH/SULFA >=320 RESISTANT Resistant     AMPICILLIN/SULBACTAM >=32 RESISTANT Resistant     PIP/TAZO 8 SENSITIVE Sensitive     * KLEBSIELLA PNEUMONIAE  Blood Culture ID Panel (Reflexed)  Status: Abnormal   Collection Time: 10/26/19  1:41 PM  Result Value Ref Range Status   Enterococcus species NOT DETECTED NOT DETECTED Final   Listeria monocytogenes NOT DETECTED NOT DETECTED Final   Staphylococcus species NOT DETECTED NOT DETECTED Final   Staphylococcus aureus (BCID) NOT DETECTED NOT DETECTED Final   Streptococcus species NOT DETECTED NOT DETECTED Final   Streptococcus agalactiae NOT DETECTED NOT DETECTED Final   Streptococcus pneumoniae NOT DETECTED NOT DETECTED Final   Streptococcus pyogenes NOT DETECTED NOT DETECTED Final    Acinetobacter baumannii NOT DETECTED NOT DETECTED Final   Enterobacteriaceae species DETECTED (A) NOT DETECTED Final    Comment: Enterobacteriaceae represent a large family of gram-negative bacteria, not a single organism. CRITICAL RESULT CALLED TO, READ BACK BY AND VERIFIED WITH:  SCOTT HALL AT 0559 10/28/19 SDR    Enterobacter cloacae complex NOT DETECTED NOT DETECTED Final   Escherichia coli NOT DETECTED NOT DETECTED Final   Klebsiella oxytoca NOT DETECTED NOT DETECTED Final   Klebsiella pneumoniae DETECTED (A) NOT DETECTED Final    Comment: CRITICAL RESULT CALLED TO, READ BACK BY AND VERIFIED WITH: SCOTT HALL AT 0559 10/28/19 SDR    Proteus species NOT DETECTED NOT DETECTED Final   Serratia marcescens NOT DETECTED NOT DETECTED Final   Carbapenem resistance NOT DETECTED NOT DETECTED Final   Haemophilus influenzae NOT DETECTED NOT DETECTED Final   Neisseria meningitidis NOT DETECTED NOT DETECTED Final   Pseudomonas aeruginosa NOT DETECTED NOT DETECTED Final   Candida albicans NOT DETECTED NOT DETECTED Final   Candida glabrata NOT DETECTED NOT DETECTED Final   Candida krusei NOT DETECTED NOT DETECTED Final   Candida parapsilosis NOT DETECTED NOT DETECTED Final   Candida tropicalis NOT DETECTED NOT DETECTED Final    Comment: Performed at [facility], [address] [place], [place] [address]  MRSA PCR Screening     Status: None   Collection Time: 10/27/19  2:34 AM   Specimen: Nasopharyngeal  Result Value Ref Range Status   MRSA by PCR NEGATIVE NEGATIVE Final    Comment:        The GeneXpert MRSA Assay (FDA approved for NASAL specimens only), is one component of a comprehensive MRSA colonization surveillance program. It is not intended to diagnose MRSA infection nor to guide or monitor treatment for MRSA infections. Performed at [facility], [address] [place], [place] [address]     Coagulation Studies: No results for input(s): LABPROT, INR in the  last 72 hours.  Urinalysis: No results for input(s): COLORURINE, LABSPEC, PHURINE, GLUCOSEU, HGBUR, BILIRUBINUR, KETONESUR, PROTEINUR, UROBILINOGEN, NITRITE, LEUKOCYTESUR in the last 72 hours.  Invalid input(s): APPERANCEUR    Imaging: US Abdomen Limited RUQ  Result Date: 10/29/2019 CLINICAL DATA:  Abdominal pain. EXAM: ULTRASOUND ABDOMEN LIMITED RIGHT UPPER QUADRANT COMPARISON:  CT a dated October 24, 2019 FINDINGS: Gallbladder: The sonographic Percell Miller sign is negative. There is gallbladder sludge without evidence for gallstones. The gallbladder is distended measuring approximately 10.9 x 5.4 x 5.8 cm. There is no gallbladder wall thickening or pericholecystic free fluid. Common bile duct: Diameter: 3 mm Liver: No focal lesion identified. Within normal limits in parenchymal echogenicity. Portal vein is patent on color Doppler imaging with normal direction of blood flow towards the liver. Other: None. IMPRESSION: Distended gallbladder with gallbladder sludge without evidence for acute cholecystitis. Electronically Signed   By: Constance Holster M.D.   On: 10/29/2019 18:53     Medications:   . sodium chloride 250 mL (10/31/19 1214)  .  fluconazole (DIFLUCAN) IV 100 mg (10/30/19 2031)  . magnesium sulfate bolus IVPB 2 g (10/31/19 1327)  . meropenem (MERREM) IV 500 mg (10/31/19 1214)   . Chlorhexidine Gluconate Cloth  6 each Topical Q0600  . docusate sodium  100 mg Oral BID  . famotidine  20 mg Oral Daily  . fluticasone  2 spray Each Nare BID  . folic acid  1 mg Oral Daily  . heparin  5,000 Units Subcutaneous Q8H  . mometasone-formoterol  2 puff Inhalation BID  . multivitamin with minerals  1 tablet Oral Daily  . nicotine  21 mg Transdermal Daily  . pantoprazole  40 mg Oral Daily  . Plecanatide  3 mg Oral Q0600  . pneumococcal 23 valent vaccine  0.5 mL Intramuscular Tomorrow-1000  . sodium chloride flush  10-40 mL Intracatheter Q12H  . sodium chloride flush  3 mL Intravenous Q12H  .  tamsulosin  0.4 mg Oral Daily  . thiamine  100 mg Oral Daily   Or  . thiamine  100 mg Intravenous Daily   sodium chloride, acetaminophen **OR** acetaminophen, albuterol, ondansetron **OR** ondansetron (ZOFRAN) IV, sodium chloride flush, white petrolatum  Assessment/ Plan:  49 y.o. 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  Acute kidney injury likely multifactorial from volume depletion, sepsis, and NSAIDs leading to ATN.  Baseline creatinine 1.4 with EGFR 45 on 09/23/2019.  Renal ultrasound revealed increased echogenicity. -Renal function improving daily.  Creatinine now down to 2.79.  Continue to monitor renal function daily.  2.  Hyponatremia.  Improved as compared to admission.  Serum sodium up to 131.  Continue fluid restriction of 1.5 L/day.  3.  Sepsis secondary to Enterobacter species and Klebsiella.  Antibiotic management per infectious disease.   LOS: 5 Katherine Perez 7/10/20211:52 PM

## 2019-11-01 LAB — CBC
HCT: 27.3 % — ABNORMAL LOW (ref 36.0–46.0)
Hemoglobin: 9.4 g/dL — ABNORMAL LOW (ref 12.0–15.0)
MCH: 30.7 pg (ref 26.0–34.0)
MCHC: 34.4 g/dL (ref 30.0–36.0)
MCV: 89.2 fL (ref 80.0–100.0)
Platelets: 267 10*3/uL (ref 150–400)
RBC: 3.06 MIL/uL — ABNORMAL LOW (ref 3.87–5.11)
RDW: 13 % (ref 11.5–15.5)
WBC: 22.6 10*3/uL — ABNORMAL HIGH (ref 4.0–10.5)
nRBC: 0 % (ref 0.0–0.2)

## 2019-11-01 LAB — BASIC METABOLIC PANEL
Anion gap: 11 (ref 5–15)
BUN: 61 mg/dL — ABNORMAL HIGH (ref 6–20)
CO2: 25 mmol/L (ref 22–32)
Calcium: 9.1 mg/dL (ref 8.9–10.3)
Chloride: 96 mmol/L — ABNORMAL LOW (ref 98–111)
Creatinine, Ser: 2.94 mg/dL — ABNORMAL HIGH (ref 0.44–1.00)
GFR calc Af Amer: 21 mL/min — ABNORMAL LOW (ref >60)
GFR calc non Af Amer: 18 mL/min — ABNORMAL LOW (ref >60)
Glucose, Bld: 101 mg/dL — ABNORMAL HIGH (ref 70–99)
Potassium: 5.2 mmol/L — ABNORMAL HIGH (ref 3.5–5.1)
Sodium: 132 mmol/L — ABNORMAL LOW (ref 135–145)

## 2019-11-01 LAB — MAGNESIUM: Magnesium: 2 mg/dL (ref 1.7–2.4)

## 2019-11-01 MED ORDER — BISACODYL 5 MG PO TBEC
20.0000 mg | DELAYED_RELEASE_TABLET | Freq: Every day | ORAL | Status: DC
  Administered 2019-11-01 – 2019-11-02 (×2): 20 mg via ORAL
  Filled 2019-11-01 (×2): qty 4

## 2019-11-01 MED ORDER — LACTATED RINGERS IV SOLN: INTRAVENOUS | Status: DC

## 2019-11-01 MED ORDER — SODIUM CHLORIDE 0.9 % IV SOLN
1.0000 g | Freq: Two times a day (BID) | INTRAVENOUS | Status: DC
  Administered 2019-11-01 – 2019-11-02 (×2): 1 g via INTRAVENOUS
  Filled 2019-11-01 (×3): qty 1

## 2019-11-01 MED ORDER — SODIUM CHLORIDE 0.9 % IV SOLN: INTRAVENOUS | Status: DC

## 2019-11-01 NOTE — Progress Notes (Signed)
[facility]  ROUNDING NOTE   Subjective:  Patient resting comfortably today. Creatinine slightly high at 2.9. Potassium also bit high at 5.2. Patient will be on lactated Ringer's.   Objective:  Vital signs in last 24 hours:  Temp:  [97.6 F (36.4 C)-98.3 F (36.8 C)] 97.6 F (36.4 C) (07/11 1152) Pulse Rate:  [83-99] 83 (07/11 1152) Resp:  [14-20] 14 (07/11 1152) BP: (119-124)/(65-71) 124/71 (07/11 1152) SpO2:  [97 %-100 %] 100 % (07/11 1152) Weight:  [60.3 kg] 60.3 kg (07/11 0426)  Weight change:  Filed Weights   10/28/19 0155 10/29/19 0500 11/01/19 0426  Weight: 57.5 kg 57.1 kg 60.3 kg    Intake/Output: I/O last 3 completed shifts: In: 2056.3 [P.O.:1796; I.V.:9.3; IV Piggyback:251] Out: 3200 [Urine:3200]   Intake/Output this shift:  Total I/O In: 720.5 [P.O.:120; I.V.:351.7; IV Piggyback:248.9] Out: 600 [Urine:600]  Physical Exam: General: No acute distress  Head: Normocephalic, atraumatic. Moist oral mucosal membranes  Eyes: Anicteric  Neck: Supple, trachea midline  Lungs:  Clear to auscultation, normal effort  Heart: S1S2 no rubs  Abdomen:  Soft, nontender, bowel sounds present  Extremities: No peripheral edema.  Neurologic: Awake, alert, following commands  Skin: No lesions       Basic Metabolic Panel: Recent Labs  Lab 10/27/19 0545 10/27/19 0957 10/28/19 0654 10/28/19 0654 10/29/19 0421 10/29/19 0421 10/30/19 0510 10/31/19 1057 11/01/19 0748  NA 123*   < > 135  --  131*  --  130* 131* 132*  K 2.8*   < > 2.3*  --  2.6*  --  3.0* 5.3* 5.2*  CL 88*   < > 92*  --  90*  --  92* 94* 96*  CO2 10*   < > 25  --  24  --  26 24 25   GLUCOSE 93   < > 144*  --  100*  --  100* 110* 101*  BUN 138*   < > 119*  --  96*  --  82* 69* 61*  CREATININE 4.71*   < > 4.18*  --  3.84*  --  3.01* 2.79* 2.94*  CALCIUM 6.9*   < > 6.9*   < > 7.4*   < > 8.3* 9.4 9.1  MG 2.3  --  2.3  --  1.8  --  1.9 1.6* 2.0  PHOS 7.2*  --  4.5  --  3.3  --   --   --   --     < > = values in this interval not displayed.    Liver Function Tests: Recent Labs  Lab 10/26/19 1150 10/27/19 0545 10/29/19 0421 10/30/19 0510  AST 39 37 21 16  ALT 31 24 18 15   ALKPHOS 177* 144* 120 98  BILITOT 1.6* 1.8* 1.2 1.0  PROT 7.4 5.9* 5.8* 5.5*  ALBUMIN 2.9* 2.2* 1.9* 1.9*   Recent Labs  Lab 10/26/19 1150  LIPASE 24   Recent Labs  Lab 10/26/19 1150  AMMONIA 16    CBC: Recent Labs  Lab 10/26/19 1150 10/27/19 0545 10/28/19 0654 10/29/19 0421 10/30/19 0510 10/31/19 1057 11/01/19 0748  WBC 21.6*   < > 19.5* 17.2* 15.6* 18.3* 22.6*  NEUTROABS 17.9*  --   --   --   --   --   --   HGB 12.3   < > 10.4* 9.5* 10.2* 11.4* 9.4*  HCT 33.2*   < > 27.8* 25.8* 27.6* 32.8* 27.3*  MCV 85.8   < > 83.2  84.9 84.7 88.4 89.2  PLT 267   < > 231 269 229 301 267   < > = values in this interval not displayed.    Cardiac Enzymes: No results for input(s): CKTOTAL, CKMB, CKMBINDEX, TROPONINI in the last 168 hours.  BNP: Invalid input(s): POCBNP  CBG: Recent Labs  Lab 10/28/19 1955 10/29/19 0355  GLUCAP 135* 109*    Microbiology: Results for orders placed or performed during the hospital encounter of 10/26/19  Group A Strep by PCR ([facility] Only)     Status: None   Collection Time: 10/26/19  1:20 PM   Specimen: Throat; Sterile Swab  Result Value Ref Range Status   Group A Strep by PCR NOT DETECTED NOT DETECTED Final    Comment: Performed at [facility], [address], [place], [place] [address]  Urine culture     Status: Abnormal   Collection Time: 10/26/19  1:21 PM   Specimen: Urine, Random  Result Value Ref Range Status   Specimen Description   Final    URINE, RANDOM Performed at [facility], [address], [place], [place] [address]    Special Requests   Final    NONE Performed at [facility], [address] [place], [place] [address]    Culture (A)  Final    >=100,000 COLONIES/mL KLEBSIELLA PNEUMONIAE Confirmed  Extended Spectrum Beta-Lactamase Producer (ESBL).  In bloodstream infections from ESBL organisms, carbapenems are preferred over piperacillin/tazobactam. They are shown to have a lower risk of mortality.    Report Status 10/29/2019 FINAL  Final   Organism ID, Bacteria KLEBSIELLA PNEUMONIAE (A)  Final      Susceptibility   Klebsiella pneumoniae - MIC*    AMPICILLIN >=32 RESISTANT Resistant     CEFAZOLIN >=64 RESISTANT Resistant     CEFTRIAXONE >=64 RESISTANT Resistant     CIPROFLOXACIN >=4 RESISTANT Resistant     GENTAMICIN <=1 SENSITIVE Sensitive     IMIPENEM <=0.25 SENSITIVE Sensitive     NITROFURANTOIN 64 INTERMEDIATE Intermediate     TRIMETH/SULFA >=320 RESISTANT Resistant     AMPICILLIN/SULBACTAM >=32 RESISTANT Resistant     PIP/TAZO 16 SENSITIVE Sensitive     * >=100,000 COLONIES/mL KLEBSIELLA PNEUMONIAE  SARS Coronavirus 2 by RT PCR (hospital order, performed in [place] hospital lab) Nasopharyngeal Nasopharyngeal Swab     Status: None   Collection Time: 10/26/19  1:40 PM   Specimen: Nasopharyngeal Swab  Result Value Ref Range Status   SARS Coronavirus 2 NEGATIVE NEGATIVE Final    Comment: (NOTE) SARS-CoV-2 target nucleic acids are NOT DETECTED.  The SARS-CoV-2 RNA is generally detectable in upper and lower respiratory specimens during the acute phase of infection. The lowest concentration of SARS-CoV-2 viral copies this assay can detect is 250 copies / mL. A negative result does not preclude SARS-CoV-2 infection and should not be used as the sole basis for treatment or other patient management decisions.  A negative result may occur with improper specimen collection / handling, submission of specimen other than nasopharyngeal swab, presence of viral mutation(s) within the areas targeted by this assay, and inadequate number of viral copies (<250 copies / mL). A negative result must be combined with clinical observations, patient history, and epidemiological  information.  Fact Sheet for Patients:   StrictlyIdeas.no  Fact Sheet for Healthcare Providers: BankingDealers.co.za  This test is not yet approved or  cleared by the [place] FDA and has been authorized for detection and/or diagnosis of SARS-CoV-2 by FDA under  an Emergency Use Authorization (EUA).  This EUA will remain in effect (meaning this test can be used) for the duration of the COVID-19 declaration under Section 564(b)(1) of the Act, 21 U.S.C. section 360bbb-3(b)(1), unless the authorization is terminated or revoked sooner.  Performed at [facility], [address] [place], [place] [address]   Culture, blood (routine x 2)     Status: Abnormal   Collection Time: 10/26/19  1:41 PM   Specimen: BLOOD  Result Value Ref Range Status   Specimen Description   Final    BLOOD RIGHT ANTECUBITAL Performed at [facility], [address], [place], [place] [address]    Special Requests   Final    BOTTLES DRAWN AEROBIC AND ANAEROBIC Blood Culture adequate volume Performed at [facility], [address], [place], [place] [address]    Culture  Setup Time   Final    GRAM NEGATIVE RODS AEROBIC BOTTLE ONLY CRITICAL VALUE NOTED.  VALUE IS CONSISTENT WITH PREVIOUSLY REPORTED AND CALLED VALUE.    Culture (A)  Final    KLEBSIELLA PNEUMONIAE SUSCEPTIBILITIES PERFORMED ON PREVIOUS CULTURE WITHIN THE LAST 5 DAYS. Performed at [facility], [place] [address] [place], [place] [address]    Report Status 10/31/2019 FINAL  Final  Culture, blood (routine x 2)     Status: Abnormal   Collection Time: 10/26/19  1:41 PM   Specimen: BLOOD  Result Value Ref Range Status   Specimen Description   Final    BLOOD BLOOD LEFT FOREARM Performed at [facility], [address] [place], [place] [address]    Special Requests   Final    BOTTLES DRAWN AEROBIC AND ANAEROBIC Blood Culture adequate  volume Performed at [facility], [address], [place], [place] [address]    Culture  Setup Time   Final    GRAM NEGATIVE RODS IN BOTH AEROBIC AND ANAEROBIC BOTTLES CRITICAL RESULT CALLED TO, READ BACK BY AND VERIFIED WITH: [place] 10/28/19 SDR Performed at [facility], [place] [address] [place], [place] [address]    Culture (A)  Final    KLEBSIELLA PNEUMONIAE Confirmed Extended Spectrum Beta-Lactamase Producer (ESBL).  In bloodstream infections from ESBL organisms, carbapenems are preferred over piperacillin/tazobactam. They are shown to have a lower risk of mortality.    Report Status 10/31/2019 FINAL  Final   Organism ID, Bacteria KLEBSIELLA PNEUMONIAE  Final      Susceptibility   Klebsiella pneumoniae - MIC*    AMPICILLIN >=32 RESISTANT Resistant     CEFAZOLIN >=64 RESISTANT Resistant     CEFEPIME >=32 RESISTANT Resistant     CEFTAZIDIME RESISTANT Resistant     CEFTRIAXONE >=64 RESISTANT Resistant     CIPROFLOXACIN >=4 RESISTANT Resistant     GENTAMICIN <=1 SENSITIVE Sensitive     IMIPENEM <=0.25 SENSITIVE Sensitive     TRIMETH/SULFA >=320 RESISTANT Resistant     AMPICILLIN/SULBACTAM >=32 RESISTANT Resistant     PIP/TAZO 8 SENSITIVE Sensitive     * KLEBSIELLA PNEUMONIAE  Blood Culture ID Panel (Reflexed)     Status: Abnormal   Collection Time: 10/26/19  1:41 PM  Result Value Ref Range Status   Enterococcus species NOT DETECTED NOT DETECTED Final   Listeria monocytogenes NOT DETECTED NOT DETECTED Final   Staphylococcus species NOT DETECTED NOT DETECTED Final   Staphylococcus aureus (BCID) NOT DETECTED NOT DETECTED Final   Streptococcus species NOT DETECTED NOT DETECTED Final   Streptococcus agalactiae NOT  DETECTED NOT DETECTED Final   Streptococcus pneumoniae NOT DETECTED NOT DETECTED Final   Streptococcus pyogenes NOT DETECTED NOT DETECTED Final   Acinetobacter baumannii NOT DETECTED NOT DETECTED Final   Enterobacteriaceae species DETECTED (A)  NOT DETECTED Final    Comment: Enterobacteriaceae represent a large family of gram-negative bacteria, not a single organism. CRITICAL RESULT CALLED TO, READ BACK BY AND VERIFIED WITH:  SCOTT HALL AT 0559 10/28/19 SDR    Enterobacter cloacae complex NOT DETECTED NOT DETECTED Final   Escherichia coli NOT DETECTED NOT DETECTED Final   Klebsiella oxytoca NOT DETECTED NOT DETECTED Final   Klebsiella pneumoniae DETECTED (A) NOT DETECTED Final    Comment: CRITICAL RESULT CALLED TO, READ BACK BY AND VERIFIED WITH: SCOTT HALL AT 0559 10/28/19 SDR    Proteus species NOT DETECTED NOT DETECTED Final   Serratia marcescens NOT DETECTED NOT DETECTED Final   Carbapenem resistance NOT DETECTED NOT DETECTED Final   Haemophilus influenzae NOT DETECTED NOT DETECTED Final   Neisseria meningitidis NOT DETECTED NOT DETECTED Final   Pseudomonas aeruginosa NOT DETECTED NOT DETECTED Final   Candida albicans NOT DETECTED NOT DETECTED Final   Candida glabrata NOT DETECTED NOT DETECTED Final   Candida krusei NOT DETECTED NOT DETECTED Final   Candida parapsilosis NOT DETECTED NOT DETECTED Final   Candida tropicalis NOT DETECTED NOT DETECTED Final    Comment: Performed at [facility], [address] [place], [place] [address]  MRSA PCR Screening     Status: None   Collection Time: 10/27/19  2:34 AM   Specimen: Nasopharyngeal  Result Value Ref Range Status   MRSA by PCR NEGATIVE NEGATIVE Final    Comment:        The GeneXpert MRSA Assay (FDA approved for NASAL specimens only), is one component of a comprehensive MRSA colonization surveillance program. It is not intended to diagnose MRSA infection nor to guide or monitor treatment for MRSA infections. Performed at [facility], [address] [place], [place] [address]     Coagulation Studies: No results for input(s): LABPROT, INR in the last 72 hours.  Urinalysis: No results for input(s): COLORURINE, LABSPEC, PHURINE, GLUCOSEU,  HGBUR, BILIRUBINUR, KETONESUR, PROTEINUR, UROBILINOGEN, NITRITE, LEUKOCYTESUR in the last 72 hours.  Invalid input(s): APPERANCEUR    Imaging: No results found.   Medications:   . sodium chloride Stopped (11/01/19 1116)  . fluconazole (DIFLUCAN) IV Stopped (10/31/19 2040)  . lactated ringers 75 mL/hr at 11/01/19 1600  . meropenem (MERREM) IV     . bisacodyl  20 mg Oral QHS  . Chlorhexidine Gluconate Cloth  6 each Topical Q0600  . docusate sodium  100 mg Oral BID  . famotidine  20 mg Oral Daily  . fluticasone  2 spray Each Nare BID  . folic acid  1 mg Oral Daily  . heparin  5,000 Units Subcutaneous Q8H  . mometasone-formoterol  2 puff Inhalation BID  . multivitamin with minerals  1 tablet Oral Daily  . nicotine  21 mg Transdermal Daily  . pantoprazole  40 mg Oral Daily  . sodium chloride flush  10-40 mL Intracatheter Q12H  . sodium chloride flush  3 mL Intravenous Q12H  . tamsulosin  0.4 mg Oral Daily  . thiamine  100 mg Oral Daily   Or  . thiamine  100 mg Intravenous Daily   sodium chloride, acetaminophen **OR** acetaminophen, albuterol, ondansetron **OR** ondansetron (ZOFRAN) IV, sodium chloride flush, white petrolatum  Assessment/ Plan:  49 y.o. 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  Acute kidney injury likely multifactorial from volume depletion, sepsis, and NSAIDs leading to ATN.  Baseline creatinine 1.4 with EGFR 45 on 09/23/2019.  Renal ultrasound revealed increased echogenicity. -Renal function slightly worse today.  Creatinine up to 2.9.  Unclear as to what led to the rise.  Continue hydration but will  consider deseeding lactated Ringer's given slightly high potassium of 5.2.  2.  Hyponatremia.  Serum sodium up slightly to 132.  Continue to monitor.  3.  Sepsis secondary to Enterobacter species and Klebsiella.  Antibiotic management per infectious disease.  4.  Hyperkalemia.  Switch lactated Ringer's to 0.9 normal saline.   LOS: 6 Parminder Cupples 7/11/20214:35 PM

## 2019-11-01 NOTE — Consult Note (Signed)
[facility] for Electrolyte Monitoring and Replacement   Recent Labs: Potassium (mmol/L)  Date Value  11/01/2019 5.2 (H)   Magnesium (mg/dL)  Date Value  11/01/2019 2.0   Calcium (mg/dL)  Date Value  11/01/2019 9.1   Albumin (g/dL)  Date Value  10/30/2019 1.9 (L)   Phosphorus (mg/dL)  Date Value  10/29/2019 3.3   Sodium (mmol/L)  Date Value  11/01/2019 132 (L)   Corrected Ca: 10.78 mg/dL  Assessment: 49 year old PMH of chronic constipation secondary to colonic inertia, pelvic floor dysfunction, acid reflux driven by gastroparesis, iron deficiency anemia, malnutrition/malabsorption, alcohol use/? Abuse, anemia, anorexia, asthma, stage III CKD, laxative abuse, recurrent UTI/interstitial cystitis, Raynaud's disease, ongoing tobacco abuse admitted for hyponatremia. spironolactone 25 mg daily was started 7/8 and stopped 7/10  Goal of Therapy:  Electrolytes WNL  Plan:  Hyperkalemia resolving  No electrolyte replacement warranted today  F/u electrolytes in am 7/12  Dallie Piles ,PharmD Clinical Pharmacist 11/01/2019 9:45 AM

## 2019-11-01 NOTE — Progress Notes (Signed)
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  Abuse, anemia, anorexia, asthma, stage III CKD, laxative abuse, recurrent UTI/interstitial cystitis, Raynaud's disease, ongoing tobacco abuse presented to [facility] ED with poor appetite, decreased energy lethargy and confusion.  She was found to have severe hyponatremia, urine concerning for infection chest x-ray also concerning for infection.  She was hospitalized for further management.  Reason for Visit: Hyponatremia.  Acute kidney injury  Consultants:  Nephrology.   Critical care medicine. Infectious disease General surgery  Procedures: None yet  Antibiotics: Anti-infectives (From admission, onward)   Start     Dose/Rate Route Frequency Ordered Stop   10/29/19 1000  meropenem (MERREM) 500 mg in sodium chloride 0.9 % 100 mL IVPB     Discontinue     500 mg 200 mL/hr over 30 Minutes Intravenous Every 12 hours 10/29/19 0801     10/28/19 1400  metroNIDAZOLE (FLAGYL) IVPB 500 mg  Status:  Discontinued        500 mg 100 mL/hr over 60 Minutes Intravenous Every 8 hours 10/28/19 1138 10/29/19 0755   10/28/19 0900  cefTRIAXone (ROCEPHIN) 2 g in sodium chloride 0.9 % 100 mL IVPB  Status:  Discontinued        2 g 200 mL/hr over 30 Minutes Intravenous Every 24 hours 10/28/19 0634 10/29/19 0755   10/26/19 2200  ceFEPIme (MAXIPIME) 2 g in sodium chloride 0.9 % 100 mL IVPB  Status:  Discontinued        2 g 200 mL/hr over 30 Minutes Intravenous Every 24  hours 10/26/19 1708 10/26/19 1751   10/26/19 2000  Ampicillin-Sulbactam (UNASYN) 3 g in sodium chloride 0.9 % 100 mL IVPB  Status:  Discontinued        3 g 200 mL/hr over 30 Minutes Intravenous Every 12 hours 10/26/19 1817 10/28/19 1138   10/26/19 1900  fluconazole (DIFLUCAN) IVPB 100 mg     Discontinue     100 mg 50 mL/hr over 60 Minutes Intravenous Every 24 hours 10/26/19 1757 11/02/19 1859   10/26/19 1315  ceFEPIme (MAXIPIME) 1 g in sodium chloride 0.9 % 100 mL IVPB        1 g 200 mL/hr over 30 Minutes Intravenous  Once 10/26/19 1308 10/26/19 1441      Subjective/Interval History: Patient feels well.  Denies any problems overnight.  Feels stronger.  No nausea or vomiting.  Looking forward to going home soon.    Assessment/Plan:  Hyponatremia Presented with a sodium of 118 with previously recorded sodium level of 134 on June 2.  Etiology thought to be multifactorial including dehydration, alcohol abuse.  Patient was given IV fluids with no improvement in sodium level.  Nephrology was consulted.  Urine osmolality noted to be 233.  Serum osmolality 295.  Patient briefly required 3% saline with improvement in sodium.  She was also placed on fluid restriction. Sodium level seems to be stable.  Continue to monitor for now.   Acute metabolic encephalopathy Most likely due to combination of hyponatremia, infection, acidosis, alcohol withdrawal.  Mental status has improved when she seems to be close to her  baseline.  Continue thiamine.  PT and OT evaluation.     Acute on chronic kidney disease stage III/electrolyte abnormality Creatinine was 1.4 on June 2.  Presented with a creatinine of 4.88.  Most likely due to NSAID use and dehydration.   Patient was started on IV fluids.  Renal function has been improving.   Creatinine noted to be slightly higher today compared to yesterday.  IV fluids were discontinued yesterday.  We will place her back on LR infusion.  Await further input from  nephrology.  Monitor urine output.   Her potassium level was noted to be 5.3 yesterday thought to be due to overcorrection.  Potassium in the IV fluids was discontinued.  Noted to be 5.2 today.  We will recheck it tomorrow.  No potassium supplements.  She was also started on spironolactone by nephrology which was also discontinued.  Elevated anion gap metabolic acidosis Most likely due to acute kidney injury and hypovolemia.  Alcohol likely contributing as well.  She is not a diabetic.  Ketones noted in the urine however this could be due to starvation.  Metabolic acidosis has resolved.  Bicarbonate infusion was discontinued a few days ago.  Klebsiella bacteremia/UTI with ESBL Klebsiella/chronic interstitial cystitis/sepsis present on admission. Patient was initially on Unasyn for aspiration pneumonia.  Subsequently on ceftriaxone and metronidazole.  Her urine culture is growing ESBL Klebsiella.  She was changed over to meropenem.  She is also bacteremic with the same organism. Source of the infection is urine. CT scan of the abdomen pelvis did not show any acute issues in the GU tract however an enlarged gallbladder was noted.  Tiny bilateral nonobstructing renal calculi were seen.  Sepsis physiology has resolved. Patient being followed by infectious disease.  Her WBC noted to be higher today compared to yesterday.  Unclear why she has worsening leukocytosis.  We will recheck labs tomorrow.   Plan is to give 10 days of IV antibiotics with last day being July 17.  Patient may need a PICC line if she is discharged before that.  She is noted to have midline.  Unclear if this would be sufficient or not.  Significantly distended gallbladder Significantly distended gallbladder was noted on the CT scan.  She was noted to have a abdominal tenderness.  Her confusion made it difficult to elicit history.  General surgery was consulted.  Ultrasound of the abdomen was done.  It does not appear that her gallbladder  is an active issue currently.  Her LFTs are normal.  No plan for any surgical intervention currently.    Oral candidiasis On Diflucan per PCCM.  Will stop after 7-day course.  Suspected aspiration pneumonia Respiratory status seems to be stable.  Continue antibacterials.  Respiratory status is stable.  Leukocytosis Secondary to infection as well as reactive component.  WBC had been improving but noted to be higher today although she remains afebrile.  Normocytic anemia Likely due to acute illness.  No evidence for overt bleeding.  Hemoglobin is stable for the most part.  Chronic constipation and anorexia Bowel regimen to continue.  CT scan also showed findings suggestive of colitis.  Patient apparently has chronic GI issues with chronic constipation.  She is followed by gastroenterology in [place].  Here in the hospital patient does not have any diarrhea.  No other GI symptoms or signs noted.  No further work-up at this time.  Discussed with her husband.  She can follow-up with her gastroenterologist at [facility]. Patient was started on bisacodyl  and Colace yesterday.  She did have a bowel movement.  Continue for now.    History of asthma Patient noted to be on inhalers.  Also noted to be on theophylline.  Holding it for now.  Theophylline level is 17.8.  Tobacco abuse Nicotine patch  History of alcohol abuse Continue CIWA protocol.  Prolonged QTC Avoid QT prolonging medications.  Continue to correct electrolytes.     DVT Prophylaxis: Subcutaneous heparin Code Status: Full code Family Communication: No family at bedside. Disposition Plan:  Status is: Inpatient  Remains inpatient appropriate because:Persistent severe electrolyte disturbances and Altered mental status   Dispo:  Patient From: Home  Planned Disposition: Home  Expected discharge date: 11/02/19  Medically stable for discharge: No      Medications:  Scheduled: . bisacodyl  20 mg Oral QHS  . Chlorhexidine  Gluconate Cloth  6 each Topical Q0600  . docusate sodium  100 mg Oral BID  . famotidine  20 mg Oral Daily  . fluticasone  2 spray Each Nare BID  . folic acid  1 mg Oral Daily  . heparin  5,000 Units Subcutaneous Q8H  . mometasone-formoterol  2 puff Inhalation BID  . multivitamin with minerals  1 tablet Oral Daily  . nicotine  21 mg Transdermal Daily  . pantoprazole  40 mg Oral Daily  . Plecanatide  3 mg Oral Q0600  . sodium chloride flush  10-40 mL Intracatheter Q12H  . sodium chloride flush  3 mL Intravenous Q12H  . tamsulosin  0.4 mg Oral Daily  . thiamine  100 mg Oral Daily   Or  . thiamine  100 mg Intravenous Daily   Continuous: . sodium chloride 250 mL (11/01/19 1001)  . fluconazole (DIFLUCAN) IV 100 mg (10/31/19 1931)  . meropenem (MERREM) IV 500 mg (11/01/19 1002)   CHY:IFOYDX chloride, acetaminophen **OR** acetaminophen, albuterol, ondansetron **OR** ondansetron (ZOFRAN) IV, sodium chloride flush, white petrolatum   Objective:  Vital Signs  Vitals:   10/31/19 0520 10/31/19 1310 10/31/19 2032 11/01/19 0426  BP: (!) 159/97 128/71 120/69 119/65  Pulse: (!) 105 95 97 99  Resp: 16 15 20 20   Temp: 98.1 F (36.7 C) 98.6 F (37 C) 98 F (36.7 C) 98.3 F (36.8 C)  TempSrc: Oral Oral Oral Oral  SpO2: 100% 100% 100% 97%  Weight:    60.3 kg  Height:        Intake/Output Summary (Last 24 hours) at 11/01/2019 1032 Last data filed at 11/01/2019 0700 Gross per 24 hour  Intake 1456.29 ml  Output 1700 ml  Net -243.71 ml   Filed Weights   10/28/19 0155 10/29/19 0500 11/01/19 0426  Weight: 57.5 kg 57.1 kg 60.3 kg   General appearance: Awake alert.  In no distress Resp: Clear to auscultation bilaterally.  Normal effort Cardio: S1-S2 is normal regular.  No S3-S4.  No rubs murmurs or bruit GI: Abdomen is soft.  Nontender nondistended.  Bowel sounds are present normal.  No masses organomegaly Extremities: No edema.  Full range of motion of lower extremities. Neurologic:  Alert and oriented x3.  No focal neurological deficits.      Lab Results:  Data Reviewed: I have personally reviewed following labs and imaging studies  CBC: Recent Labs  Lab 10/26/19 1150 10/27/19 0545 10/28/19 0654 10/29/19 0421 10/30/19 0510 10/31/19 1057 11/01/19 0748  WBC 21.6*   < > 19.5* 17.2* 15.6* 18.3* 22.6*  NEUTROABS 17.9*  --   --   --   --   --   --  HGB 12.3   < > 10.4* 9.5* 10.2* 11.4* 9.4*  HCT 33.2*   < > 27.8* 25.8* 27.6* 32.8* 27.3*  MCV 85.8   < > 83.2 84.9 84.7 88.4 89.2  PLT 267   < > 231 269 229 301 267   < > = values in this interval not displayed.    Basic Metabolic Panel: Recent Labs  Lab 10/27/19 0545 10/27/19 0957 10/28/19 0654 10/29/19 0421 10/30/19 0510 10/31/19 1057 11/01/19 0748  NA 123*   < > 135 131* 130* 131* 132*  K 2.8*   < > 2.3* 2.6* 3.0* 5.3* 5.2*  CL 88*   < > 92* 90* 92* 94* 96*  CO2 10*   < > 25 24 26 24 25   GLUCOSE 93   < > 144* 100* 100* 110* 101*  BUN 138*   < > 119* 96* 82* 69* 61*  CREATININE 4.71*   < > 4.18* 3.84* 3.01* 2.79* 2.94*  CALCIUM 6.9*   < > 6.9* 7.4* 8.3* 9.4 9.1  MG 2.3  --  2.3 1.8 1.9 1.6* 2.0  PHOS 7.2*  --  4.5 3.3  --   --   --    < > = values in this interval not displayed.    GFR: Estimated Creatinine Clearance: 22.3 mL/min (A) (by C-G formula based on SCr of 2.94 mg/dL (H)).  Liver Function Tests: Recent Labs  Lab 10/26/19 1150 10/27/19 0545 10/29/19 0421 10/30/19 0510  AST 39 37 21 16  ALT 31 24 18 15   ALKPHOS 177* 144* 120 98  BILITOT 1.6* 1.8* 1.2 1.0  PROT 7.4 5.9* 5.8* 5.5*  ALBUMIN 2.9* 2.2* 1.9* 1.9*    Recent Labs  Lab 10/26/19 1150  LIPASE 24   Recent Labs  Lab 10/26/19 1150  AMMONIA 16     Recent Results (from the past 240 hour(s))  Group A Strep by PCR ([facility] Only)     Status: None   Collection Time: 10/26/19  1:20 PM   Specimen: Throat; Sterile Swab  Result Value Ref Range Status   Group A Strep by PCR NOT DETECTED NOT DETECTED Final    Comment:  Performed at [facility], [address], [place], [place] [address]  Urine culture     Status: Abnormal   Collection Time: 10/26/19  1:21 PM   Specimen: Urine, Random  Result Value Ref Range Status   Specimen Description   Final    URINE, RANDOM Performed at [facility], [address], [place], [place] [address]    Special Requests   Final    NONE Performed at [facility], [address] [place], [place] [address]    Culture (A)  Final    >=100,000 COLONIES/mL KLEBSIELLA PNEUMONIAE Confirmed Extended Spectrum Beta-Lactamase Producer (ESBL).  In bloodstream infections from ESBL organisms, carbapenems are preferred over piperacillin/tazobactam. They are shown to have a lower risk of mortality.    Report Status 10/29/2019 FINAL  Final   Organism ID, Bacteria KLEBSIELLA PNEUMONIAE (A)  Final      Susceptibility   Klebsiella pneumoniae - MIC*    AMPICILLIN >=32 RESISTANT Resistant     CEFAZOLIN >=64 RESISTANT Resistant     CEFTRIAXONE >=64 RESISTANT Resistant     CIPROFLOXACIN >=4 RESISTANT Resistant     GENTAMICIN <=1 SENSITIVE Sensitive     IMIPENEM <=0.25 SENSITIVE Sensitive     NITROFURANTOIN 64 INTERMEDIATE Intermediate     TRIMETH/SULFA >=320 RESISTANT Resistant  AMPICILLIN/SULBACTAM >=32 RESISTANT Resistant     PIP/TAZO 16 SENSITIVE Sensitive     * >=100,000 COLONIES/mL KLEBSIELLA PNEUMONIAE  SARS Coronavirus 2 by RT PCR (hospital order, performed in [facility] hospital lab) Nasopharyngeal Nasopharyngeal Swab     Status: None   Collection Time: 10/26/19  1:40 PM   Specimen: Nasopharyngeal Swab  Result Value Ref Range Status   SARS Coronavirus 2 NEGATIVE NEGATIVE Final    Comment: (NOTE) SARS-CoV-2 target nucleic acids are NOT DETECTED.  The SARS-CoV-2 RNA is generally detectable in upper and lower respiratory specimens during the acute phase of infection. The lowest concentration of SARS-CoV-2 viral copies this assay can detect is  250 copies / mL. A negative result does not preclude SARS-CoV-2 infection and should not be used as the sole basis for treatment or other patient management decisions.  A negative result may occur with improper specimen collection / handling, submission of specimen other than nasopharyngeal swab, presence of viral mutation(s) within the areas targeted by this assay, and inadequate number of viral copies (<250 copies / mL). A negative result must be combined with clinical observations, patient history, and epidemiological information.  Fact Sheet for Patients:   StrictlyIdeas.no  Fact Sheet for Healthcare Providers: BankingDealers.co.za  This test is not yet approved or  cleared by the [place] FDA and has been authorized for detection and/or diagnosis of SARS-CoV-2 by FDA under an Emergency Use Authorization (EUA).  This EUA will remain in effect (meaning this test can be used) for the duration of the COVID-19 declaration under Section 564(b)(1) of the Act, 21 U.S.C. section 360bbb-3(b)(1), unless the authorization is terminated or revoked sooner.  Performed at [facility], [address] [place], [place] [address]   Culture, blood (routine x 2)     Status: Abnormal   Collection Time: 10/26/19  1:41 PM   Specimen: BLOOD  Result Value Ref Range Status   Specimen Description   Final    BLOOD RIGHT ANTECUBITAL Performed at [facility], [address] [place], [place] [address]    Special Requests   Final    BOTTLES DRAWN AEROBIC AND ANAEROBIC Blood Culture adequate volume Performed at [facility], [address], [place], [place] [address]    Culture  Setup Time   Final    GRAM NEGATIVE RODS AEROBIC BOTTLE ONLY CRITICAL VALUE NOTED.  VALUE IS CONSISTENT WITH PREVIOUSLY REPORTED AND CALLED VALUE.    Culture (A)  Final    KLEBSIELLA PNEUMONIAE SUSCEPTIBILITIES PERFORMED ON PREVIOUS CULTURE  WITHIN THE LAST 5 DAYS. Performed at [facility], [place] [address], [place], [place] [address]    Report Status 10/31/2019 FINAL  Final  Culture, blood (routine x 2)     Status: Abnormal   Collection Time: 10/26/19  1:41 PM   Specimen: BLOOD  Result Value Ref Range Status   Specimen Description   Final    BLOOD BLOOD LEFT FOREARM Performed at [facility], [address] [place], [place] [address]    Special Requests   Final    BOTTLES DRAWN AEROBIC AND ANAEROBIC Blood Culture adequate volume Performed at [facility], [address] [place], [place] [address]    Culture  Setup Time   Final    GRAM NEGATIVE RODS IN BOTH AEROBIC AND ANAEROBIC BOTTLES CRITICAL RESULT CALLED TO, READ BACK BY AND VERIFIED WITH: [place] 10/28/19 SDR Performed at [facility], [place] [address] [place], [place]  [address]    Culture (A)  Final    KLEBSIELLA PNEUMONIAE Confirmed Extended Spectrum Beta-Lactamase Producer (ESBL).  In bloodstream infections from ESBL organisms, carbapenems are preferred over piperacillin/tazobactam. They are shown to have a lower risk of mortality.    Report Status 10/31/2019 FINAL  Final   Organism ID, Bacteria KLEBSIELLA PNEUMONIAE  Final      Susceptibility   Klebsiella pneumoniae - MIC*    AMPICILLIN >=32 RESISTANT Resistant     CEFAZOLIN >=64 RESISTANT Resistant     CEFEPIME >=32 RESISTANT Resistant     CEFTAZIDIME RESISTANT Resistant     CEFTRIAXONE >=64 RESISTANT Resistant     CIPROFLOXACIN >=4 RESISTANT Resistant     GENTAMICIN <=1 SENSITIVE Sensitive     IMIPENEM <=0.25 SENSITIVE Sensitive     TRIMETH/SULFA >=320 RESISTANT Resistant     AMPICILLIN/SULBACTAM >=32 RESISTANT Resistant     PIP/TAZO 8 SENSITIVE Sensitive     * KLEBSIELLA PNEUMONIAE  Blood Culture ID Panel (Reflexed)     Status: Abnormal   Collection Time: 10/26/19  1:41 PM  Result Value Ref Range Status   Enterococcus species NOT DETECTED NOT DETECTED  Final   Listeria monocytogenes NOT DETECTED NOT DETECTED Final   Staphylococcus species NOT DETECTED NOT DETECTED Final   Staphylococcus aureus (BCID) NOT DETECTED NOT DETECTED Final   Streptococcus species NOT DETECTED NOT DETECTED Final   Streptococcus agalactiae NOT DETECTED NOT DETECTED Final   Streptococcus pneumoniae NOT DETECTED NOT DETECTED Final   Streptococcus pyogenes NOT DETECTED NOT DETECTED Final   Acinetobacter baumannii NOT DETECTED NOT DETECTED Final   Enterobacteriaceae species DETECTED (A) NOT DETECTED Final    Comment: Enterobacteriaceae represent a large family of gram-negative bacteria, not a single organism. CRITICAL RESULT CALLED TO, READ BACK BY AND VERIFIED WITH:  SCOTT HALL AT 0559 10/28/19 SDR    Enterobacter cloacae complex NOT DETECTED NOT DETECTED Final   Escherichia coli NOT DETECTED NOT DETECTED Final   Klebsiella oxytoca NOT DETECTED NOT DETECTED Final   Klebsiella pneumoniae DETECTED (A) NOT DETECTED Final    Comment: CRITICAL RESULT CALLED TO, READ BACK BY AND VERIFIED WITH: SCOTT HALL AT 0559 10/28/19 SDR    Proteus species NOT DETECTED NOT DETECTED Final   Serratia marcescens NOT DETECTED NOT DETECTED Final   Carbapenem resistance NOT DETECTED NOT DETECTED Final   Haemophilus influenzae NOT DETECTED NOT DETECTED Final   Neisseria meningitidis NOT DETECTED NOT DETECTED Final   Pseudomonas aeruginosa NOT DETECTED NOT DETECTED Final   Candida albicans NOT DETECTED NOT DETECTED Final   Candida glabrata NOT DETECTED NOT DETECTED Final   Candida krusei NOT DETECTED NOT DETECTED Final   Candida parapsilosis NOT DETECTED NOT DETECTED Final   Candida tropicalis NOT DETECTED NOT DETECTED Final    Comment: Performed at [facility], [address] [place], [place] [address]  MRSA PCR Screening     Status: None   Collection Time: 10/27/19  2:34 AM   Specimen: Nasopharyngeal  Result Value Ref Range Status   MRSA by PCR NEGATIVE NEGATIVE Final     Comment:        The GeneXpert MRSA Assay (FDA approved for NASAL specimens only), is one component of a comprehensive MRSA colonization surveillance program. It is not intended to diagnose MRSA infection nor to guide or monitor treatment for MRSA infections. Performed at [facility], [address], [place], [place] [address]       Radiology Studies: No results found.  LOS: 6 days   Katherine Perez [employer] on [email or web address]  11/01/2019, 10:32 AM

## 2019-11-02 LAB — BASIC METABOLIC PANEL
Anion gap: 11 (ref 5–15)
BUN: 54 mg/dL — ABNORMAL HIGH (ref 6–20)
CO2: 22 mmol/L (ref 22–32)
Calcium: 8.9 mg/dL (ref 8.9–10.3)
Chloride: 101 mmol/L (ref 98–111)
Creatinine, Ser: 2.62 mg/dL — ABNORMAL HIGH (ref 0.44–1.00)
GFR calc Af Amer: 24 mL/min — ABNORMAL LOW (ref >60)
GFR calc non Af Amer: 21 mL/min — ABNORMAL LOW (ref >60)
Glucose, Bld: 95 mg/dL (ref 70–99)
Potassium: 5.3 mmol/L — ABNORMAL HIGH (ref 3.5–5.1)
Sodium: 134 mmol/L — ABNORMAL LOW (ref 135–145)

## 2019-11-02 LAB — MAGNESIUM: Magnesium: 1.8 mg/dL (ref 1.7–2.4)

## 2019-11-02 LAB — CBC
HCT: 26.2 % — ABNORMAL LOW (ref 36.0–46.0)
Hemoglobin: 9.1 g/dL — ABNORMAL LOW (ref 12.0–15.0)
MCH: 31.3 pg (ref 26.0–34.0)
MCHC: 34.7 g/dL (ref 30.0–36.0)
MCV: 90 fL (ref 80.0–100.0)
Platelets: 286 10*3/uL (ref 150–400)
RBC: 2.91 MIL/uL — ABNORMAL LOW (ref 3.87–5.11)
RDW: 12.9 % (ref 11.5–15.5)
WBC: 19.7 10*3/uL — ABNORMAL HIGH (ref 4.0–10.5)
nRBC: 0 % (ref 0.0–0.2)

## 2019-11-02 MED ORDER — SODIUM CHLORIDE 0.9 % IV SOLN
500.0000 mg | INTRAVENOUS | Status: DC
  Administered 2019-11-02: 500 mg via INTRAVENOUS
  Filled 2019-11-02 (×2): qty 0.5

## 2019-11-02 NOTE — Treatment Plan (Signed)
Diagnosis: ESBL Klebsiella bacteremia Baseline Creatinine 2.62    Allergies  Allergen Reactions  . Cetirizine Hives  . Diazepam Other (See Comments)    Depression   . Baclofen Anxiety and Other (See Comments)    AMS - "Really messed me up, caused me to drop things"     OPAT Orders Discharge antibiotics: IV ertapenem 500mg  every 24 hours until 11/07/19   Midline Care Per Protocol:  Labs on Thursday  while on IV antibiotics: _X_ CBC with differential  _X_ CMP   _X_ Please pull PIC at completion of IV antibiotics   Fax weekly labs to [phone]  Clinic Follow Up Appt:in 1 week  Call [phone] to make appt

## 2019-11-02 NOTE — Progress Notes (Signed)
Order received from Dr [place] Pink to discontinue CIWA protocol

## 2019-11-02 NOTE — Progress Notes (Signed)
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  Abuse, anemia, anorexia, asthma, stage III CKD, laxative abuse, recurrent UTI/interstitial cystitis, Raynaud's disease, ongoing tobacco abuse presented to [facility] ED with poor appetite, decreased energy lethargy and confusion.  She was found to have severe hyponatremia, urine concerning for infection chest x-ray also concerning for infection.  She was hospitalized for further management.  Reason for Visit: Hyponatremia.  Acute kidney injury  Consultants:  Nephrology.   Critical care medicine. Infectious disease General surgery  Procedures: None yet  Antibiotics: Anti-infectives (From admission, onward)   Start     Dose/Rate Route Frequency Ordered Stop   11/01/19 2200  meropenem (MERREM) 1 g in sodium chloride 0.9 % 100 mL IVPB     Discontinue     1 g 200 mL/hr over 30 Minutes Intravenous Every 12 hours 11/01/19 1302     10/29/19 1000  meropenem (MERREM) 500 mg in sodium chloride 0.9 % 100 mL IVPB  Status:  Discontinued        500 mg 200 mL/hr over 30 Minutes Intravenous Every 12 hours 10/29/19 0801 11/01/19 1302   10/28/19 1400  metroNIDAZOLE (FLAGYL) IVPB 500 mg  Status:  Discontinued        500 mg 100 mL/hr over 60 Minutes Intravenous Every 8 hours 10/28/19 1138 10/29/19 0755   10/28/19 0900  cefTRIAXone (ROCEPHIN) 2 g in sodium chloride 0.9 % 100 mL IVPB  Status:  Discontinued        2 g 200 mL/hr over 30 Minutes Intravenous Every 24 hours  10/28/19 0634 10/29/19 0755   10/26/19 2200  ceFEPIme (MAXIPIME) 2 g in sodium chloride 0.9 % 100 mL IVPB  Status:  Discontinued        2 g 200 mL/hr over 30 Minutes Intravenous Every 24 hours 10/26/19 1708 10/26/19 1751   10/26/19 2000  Ampicillin-Sulbactam (UNASYN) 3 g in sodium chloride 0.9 % 100 mL IVPB  Status:  Discontinued        3 g 200 mL/hr over 30 Minutes Intravenous Every 12 hours 10/26/19 1817 10/28/19 1138   10/26/19 1900  fluconazole (DIFLUCAN) IVPB 100 mg        100 mg 50 mL/hr over 60 Minutes Intravenous Every 24 hours 10/26/19 1757 11/01/19 1837   10/26/19 1315  ceFEPIme (MAXIPIME) 1 g in sodium chloride 0.9 % 100 mL IVPB        1 g 200 mL/hr over 30 Minutes Intravenous  Once 10/26/19 1308 10/26/19 1441      Subjective/Interval History: Patient denies any complaints this morning.  She is disappointed that she has to stay longer in the hospital.  She was explained the rationale for the same.  She was informed about her blood results this morning.  No shortness of breath.  No abdominal pain.     Assessment/Plan:  Acute on chronic kidney disease stage III/electrolyte abnormality Creatinine was 1.4 on June 2.  Presented with a creatinine of 4.88.  Most likely due to NSAID use and dehydration.  CT scan did not show any hydronephrosis.  Tiny bilateral nonobstructing renal calculi were noted. Patient  was started on IV fluids with improvement in renal function.  Yesterday creatinine went up for unclear reason to 2.94.  Started back on IV fluids.  Creatinine down to 2.60 today.  Potassium remains mildly elevated at 5.3.  Could be due to the lactated Ringer she was getting.  She was also supplemented aggressively and was on spironolactone for a day or 2.  Recheck it again tomorrow.  Klebsiella bacteremia/UTI with ESBL Klebsiella/chronic interstitial cystitis/sepsis present on admission. Patient was initially on Unasyn for aspiration pneumonia.  Subsequently on ceftriaxone and  metronidazole.  Her urine culture is growing ESBL Klebsiella.  She was changed over to meropenem.  She is also bacteremic with the same organism. Source of the infection is urine. CT scan of the abdomen pelvis did not show any acute issues in the GU tract however an enlarged gallbladder was noted.  Tiny bilateral nonobstructing renal calculi were seen.  Sepsis physiology has resolved. Patient being followed by infectious disease.  WBC noted to be higher yesterday but improved to 19.7 today.  Noted to be afebrile.  Remains on IV meropenem.  Will need antibiotics till July 17.  Unclear if this can be administered via the midline that she has currently when she is discharged or will she need a PICC line.  Will discuss with ID.    Hyponatremia Presented with a sodium of 118 with previously recorded sodium level of 134 on June 2.  Etiology thought to be multifactorial including dehydration, alcohol abuse.  Patient was given IV fluids with no improvement in sodium level.  Nephrology was consulted.  Urine osmolality noted to be 233.  Serum osmolality 295.  Patient briefly required 3% saline with improvement in sodium.  She was also placed on fluid restriction. Sodium level seems to be stable.  Slightly better today.  Continue to monitor.    Acute metabolic encephalopathy Most likely due to combination of hyponatremia, infection, acidosis, alcohol withdrawal.  Mental status has improved when she seems to be close to her baseline.  Continue thiamine.  PT and OT evaluation.     Elevated anion gap metabolic acidosis Most likely due to acute kidney injury and hypovolemia.  Alcohol likely contributing as well.  She is not a diabetic.  Ketones noted in the urine however this could be due to starvation.  Metabolic acidosis has resolved.  Bicarbonate infusion was discontinued a few days ago.  Significantly distended gallbladder Significantly distended gallbladder was noted on the CT scan.  She was noted to have a  abdominal tenderness.  Her confusion made it difficult to elicit history.  General surgery was consulted.  Ultrasound of the abdomen was done.  It does not appear that her gallbladder is an active issue currently.  Her LFTs are normal.  No plan for any surgical intervention currently.    Oral candidiasis Complete a 7-day course of Diflucan.  Suspected aspiration pneumonia Respiratory status is stable.  She saturating normal on room air.  Normocytic anemia Likely due to acute illness.  No evidence for overt bleeding.  Hemoglobin is stable for the most part.  Chronic constipation and anorexia Bowel regimen to continue.  CT scan also showed findings suggestive of colitis.  Patient apparently has chronic GI issues with chronic constipation.  She is followed by gastroenterology in [place].  Here in the hospital patient does not have any diarrhea.  No other GI symptoms or signs noted.  No further work-up at this time.  Discussed with her husband.  She can follow-up  with her gastroenterologist at [facility]. Patient was started on bisacodyl and Colace.  History of asthma Patient noted to be on inhalers.  Also noted to be on theophylline.  Holding it for now.  Theophylline level is 17.8.  Tobacco abuse Nicotine patch  History of alcohol abuse Continue CIWA protocol.  Prolonged QTC Likely due to abnormal electrolytes.  EKG repeated 7/11 and was noted to have normal QTC.   DVT Prophylaxis: Subcutaneous heparin Code Status: Full code Family Communication: No family at bedside. Disposition Plan:  Status is: Inpatient  Remains inpatient appropriate because:Persistent severe electrolyte disturbances and Altered mental status   Dispo:  Patient From: Home  Planned Disposition: Home  Expected discharge date: 11/03/19  Medically stable for discharge: No      Medications:  Scheduled: . bisacodyl  20 mg Oral QHS  . Chlorhexidine Gluconate Cloth  6 each Topical Q0600  . docusate sodium  100  mg Oral BID  . famotidine  20 mg Oral Daily  . fluticasone  2 spray Each Nare BID  . folic acid  1 mg Oral Daily  . heparin  5,000 Units Subcutaneous Q8H  . mometasone-formoterol  2 puff Inhalation BID  . multivitamin with minerals  1 tablet Oral Daily  . nicotine  21 mg Transdermal Daily  . pantoprazole  40 mg Oral Daily  . sodium chloride flush  10-40 mL Intracatheter Q12H  . sodium chloride flush  3 mL Intravenous Q12H  . tamsulosin  0.4 mg Oral Daily  . thiamine  100 mg Oral Daily   Or  . thiamine  100 mg Intravenous Daily   Continuous: . sodium chloride Stopped (11/01/19 1116)  . sodium chloride Stopped (11/01/19 1737)  . meropenem (MERREM) IV 1 g (11/01/19 2112)   QJJ:HERDEY chloride, acetaminophen **OR** acetaminophen, albuterol, ondansetron **OR** ondansetron (ZOFRAN) IV, sodium chloride flush, white petrolatum   Objective:  Vital Signs  Vitals:   11/01/19 1152 11/01/19 1955 11/02/19 0339 11/02/19 0500  BP: 124/71 120/67 110/66   Pulse: 83 94 95   Resp: 14 20    Temp: 97.6 F (36.4 C) 98.2 F (36.8 C) 99.2 F (37.3 C)   TempSrc: Oral Oral Oral   SpO2: 100% 100% 100%   Weight:    60.3 kg  Height:        Intake/Output Summary (Last 24 hours) at 11/02/2019 1015 Last data filed at 11/02/2019 0648 Gross per 24 hour  Intake 721.91 ml  Output 1300 ml  Net -578.09 ml   Filed Weights   10/29/19 0500 11/01/19 0426 11/02/19 0500  Weight: 57.1 kg 60.3 kg 60.3 kg    General appearance: Awake alert.  In no distress Resp: Clear to auscultation bilaterally.  Normal effort Cardio: S1-S2 is normal regular.  No S3-S4.  No rubs murmurs or bruit GI: Abdomen is soft.  Nontender nondistended.  Bowel sounds are present normal.  No masses organomegaly Extremities: No edema.  Full range of motion of lower extremities. Neurologic: Alert and oriented x3.  No focal neurological deficits.      Lab Results:  Data Reviewed: I have personally reviewed following labs and imaging  studies  CBC: Recent Labs  Lab 10/26/19 1150 10/27/19 0545 10/29/19 0421 10/30/19 0510 10/31/19 1057 11/01/19 0748 11/02/19 0627  WBC 21.6*   < > 17.2* 15.6* 18.3* 22.6* 19.7*  NEUTROABS 17.9*  --   --   --   --   --   --   HGB 12.3   < >  9.5* 10.2* 11.4* 9.4* 9.1*  HCT 33.2*   < > 25.8* 27.6* 32.8* 27.3* 26.2*  MCV 85.8   < > 84.9 84.7 88.4 89.2 90.0  PLT 267   < > 269 229 301 267 286   < > = values in this interval not displayed.    Basic Metabolic Panel: Recent Labs  Lab 10/27/19 0545 10/27/19 0957 10/28/19 0654 10/28/19 0654 10/29/19 0421 10/30/19 0510 10/31/19 1057 11/01/19 0748 11/02/19 0627  NA 123*   < > 135   < > 131* 130* 131* 132* 134*  K 2.8*   < > 2.3*   < > 2.6* 3.0* 5.3* 5.2* 5.3*  CL 88*   < > 92*   < > 90* 92* 94* 96* 101  CO2 10*   < > 25   < > 24 26 24 25 22   GLUCOSE 93   < > 144*   < > 100* 100* 110* 101* 95  BUN 138*   < > 119*   < > 96* 82* 69* 61* 54*  CREATININE 4.71*   < > 4.18*   < > 3.84* 3.01* 2.79* 2.94* 2.62*  CALCIUM 6.9*   < > 6.9*   < > 7.4* 8.3* 9.4 9.1 8.9  MG 2.3  --  2.3   < > 1.8 1.9 1.6* 2.0 1.8  PHOS 7.2*  --  4.5  --  3.3  --   --   --   --    < > = values in this interval not displayed.    GFR: Estimated Creatinine Clearance: 25 mL/min (A) (by C-G formula based on SCr of 2.62 mg/dL (H)).  Liver Function Tests: Recent Labs  Lab 10/26/19 1150 10/27/19 0545 10/29/19 0421 10/30/19 0510  AST 39 37 21 16  ALT 31 24 18 15   ALKPHOS 177* 144* 120 98  BILITOT 1.6* 1.8* 1.2 1.0  PROT 7.4 5.9* 5.8* 5.5*  ALBUMIN 2.9* 2.2* 1.9* 1.9*    Recent Labs  Lab 10/26/19 1150  LIPASE 24   Recent Labs  Lab 10/26/19 1150  AMMONIA 16     Recent Results (from the past 240 hour(s))  Group A Strep by PCR ([facility] Only)     Status: None   Collection Time: 10/26/19  1:20 PM   Specimen: Throat; Sterile Swab  Result Value Ref Range Status   Group A Strep by PCR NOT DETECTED NOT DETECTED Final    Comment: Performed at [facility], [address], [place], [place] [address]  Urine culture     Status: Abnormal   Collection Time: 10/26/19  1:21 PM   Specimen: Urine, Random  Result Value Ref Range Status   Specimen Description   Final    URINE, RANDOM Performed at [facility], [address] [place], [place] [address]    Special Requests   Final    NONE Performed at [facility], [address] [place],  [address]    Culture (A)  Final    >=100,000 COLONIES/mL KLEBSIELLA PNEUMONIAE Confirmed Extended Spectrum Beta-Lactamase Producer (ESBL).  In bloodstream infections from ESBL organisms, carbapenems are preferred over piperacillin/tazobactam. They are shown to have a lower risk of mortality.    Report Status 10/29/2019 FINAL  Final   Organism ID, Bacteria KLEBSIELLA PNEUMONIAE (A)  Final      Susceptibility   Klebsiella pneumoniae - MIC*    AMPICILLIN >=32 RESISTANT Resistant     CEFAZOLIN >=64 RESISTANT Resistant  CEFTRIAXONE >=64 RESISTANT Resistant     CIPROFLOXACIN >=4 RESISTANT Resistant     GENTAMICIN <=1 SENSITIVE Sensitive     IMIPENEM <=0.25 SENSITIVE Sensitive     NITROFURANTOIN 64 INTERMEDIATE Intermediate     TRIMETH/SULFA >=320 RESISTANT Resistant     AMPICILLIN/SULBACTAM >=32 RESISTANT Resistant     PIP/TAZO 16 SENSITIVE Sensitive     * >=100,000 COLONIES/mL KLEBSIELLA PNEUMONIAE  SARS Coronavirus 2 by RT PCR (hospital order, performed in [place] hospital lab) Nasopharyngeal Nasopharyngeal Swab     Status: None   Collection Time: 10/26/19  1:40 PM   Specimen: Nasopharyngeal Swab  Result Value Ref Range Status   SARS Coronavirus 2 NEGATIVE NEGATIVE Final    Comment: (NOTE) SARS-CoV-2 target nucleic acids are NOT DETECTED.  The SARS-CoV-2 RNA is generally detectable in upper and lower respiratory specimens during the acute phase of infection. The lowest concentration of SARS-CoV-2 viral copies this assay can detect is 250 copies / mL. A  negative result does not preclude SARS-CoV-2 infection and should not be used as the sole basis for treatment or other patient management decisions.  A negative result may occur with improper specimen collection / handling, submission of specimen other than nasopharyngeal swab, presence of viral mutation(s) within the areas targeted by this assay, and inadequate number of viral copies (<250 copies / mL). A negative result must be combined with clinical observations, patient history, and epidemiological information.  Fact Sheet for Patients:   StrictlyIdeas.no  Fact Sheet for Healthcare Providers: BankingDealers.co.za  This test is not yet approved or  cleared by the [place] FDA and has been authorized for detection and/or diagnosis of SARS-CoV-2 by FDA under an Emergency Use Authorization (EUA).  This EUA will remain in effect (meaning this test can be used) for the duration of the COVID-19 declaration under Section 564(b)(1) of the Act, 21 U.S.C. section 360bbb-3(b)(1), unless the authorization is terminated or revoked sooner.  Performed at [facility], [address] [place], [place] [address]   Culture, blood (routine x 2)     Status: Abnormal   Collection Time: 10/26/19  1:41 PM   Specimen: BLOOD  Result Value Ref Range Status   Specimen Description   Final    BLOOD RIGHT ANTECUBITAL Performed at [facility], [address] [place], [place] [address]    Special Requests   Final    BOTTLES DRAWN AEROBIC AND ANAEROBIC Blood Culture adequate volume Performed at [facility], [address], [place], [place] [address]    Culture  Setup Time   Final    GRAM NEGATIVE RODS AEROBIC BOTTLE ONLY CRITICAL VALUE NOTED.  VALUE IS CONSISTENT WITH PREVIOUSLY REPORTED AND CALLED VALUE.    Culture (A)  Final    KLEBSIELLA PNEUMONIAE SUSCEPTIBILITIES PERFORMED ON PREVIOUS CULTURE WITHIN THE LAST 5  DAYS. Performed at [facility], [place] [address], [place], [place] [address]    Report Status 10/31/2019 FINAL  Final  Culture, blood (routine x 2)     Status: Abnormal   Collection Time: 10/26/19  1:41 PM   Specimen: BLOOD  Result Value Ref Range Status   Specimen Description   Final    BLOOD BLOOD LEFT FOREARM Performed at [facility], [address], [place], [place] [address]    Special Requests   Final    BOTTLES DRAWN AEROBIC AND ANAEROBIC Blood Culture adequate volume Performed at [facility], [address], [place], [place] [address]  Culture  Setup Time   Final    GRAM NEGATIVE RODS IN BOTH AEROBIC AND ANAEROBIC BOTTLES CRITICAL RESULT CALLED TO, READ BACK BY AND VERIFIED WITH: [place] AT 3710 10/28/19 SDR Performed at [facility], [place] [address] [place], [place] [address]    Culture (A)  Final    KLEBSIELLA PNEUMONIAE Confirmed Extended Spectrum Beta-Lactamase Producer (ESBL).  In bloodstream infections from ESBL organisms, carbapenems are preferred over piperacillin/tazobactam. They are shown to have a lower risk of mortality.    Report Status 10/31/2019 FINAL  Final   Organism ID, Bacteria KLEBSIELLA PNEUMONIAE  Final      Susceptibility   Klebsiella pneumoniae - MIC*    AMPICILLIN >=32 RESISTANT Resistant     CEFAZOLIN >=64 RESISTANT Resistant     CEFEPIME >=32 RESISTANT Resistant     CEFTAZIDIME RESISTANT Resistant     CEFTRIAXONE >=64 RESISTANT Resistant     CIPROFLOXACIN >=4 RESISTANT Resistant     GENTAMICIN <=1 SENSITIVE Sensitive     IMIPENEM <=0.25 SENSITIVE Sensitive     TRIMETH/SULFA >=320 RESISTANT Resistant     AMPICILLIN/SULBACTAM >=32 RESISTANT Resistant     PIP/TAZO 8 SENSITIVE Sensitive     * KLEBSIELLA PNEUMONIAE  Blood Culture ID Panel (Reflexed)     Status: Abnormal   Collection Time: 10/26/19  1:41 PM  Result Value Ref Range Status   Enterococcus species NOT DETECTED NOT DETECTED Final   Listeria  monocytogenes NOT DETECTED NOT DETECTED Final   Staphylococcus species NOT DETECTED NOT DETECTED Final   Staphylococcus aureus (BCID) NOT DETECTED NOT DETECTED Final   Streptococcus species NOT DETECTED NOT DETECTED Final   Streptococcus agalactiae NOT DETECTED NOT DETECTED Final   Streptococcus pneumoniae NOT DETECTED NOT DETECTED Final   Streptococcus pyogenes NOT DETECTED NOT DETECTED Final   Acinetobacter baumannii NOT DETECTED NOT DETECTED Final   Enterobacteriaceae species DETECTED (A) NOT DETECTED Final    Comment: Enterobacteriaceae represent a large family of gram-negative bacteria, not a single organism. CRITICAL RESULT CALLED TO, READ BACK BY AND VERIFIED WITH:  SCOTT HALL AT 0559 10/28/19 SDR    Enterobacter cloacae complex NOT DETECTED NOT DETECTED Final   Escherichia coli NOT DETECTED NOT DETECTED Final   Klebsiella oxytoca NOT DETECTED NOT DETECTED Final   Klebsiella pneumoniae DETECTED (A) NOT DETECTED Final    Comment: CRITICAL RESULT CALLED TO, READ BACK BY AND VERIFIED WITH: SCOTT HALL AT 0559 10/28/19 SDR    Proteus species NOT DETECTED NOT DETECTED Final   Serratia marcescens NOT DETECTED NOT DETECTED Final   Carbapenem resistance NOT DETECTED NOT DETECTED Final   Haemophilus influenzae NOT DETECTED NOT DETECTED Final   Neisseria meningitidis NOT DETECTED NOT DETECTED Final   Pseudomonas aeruginosa NOT DETECTED NOT DETECTED Final   Candida albicans NOT DETECTED NOT DETECTED Final   Candida glabrata NOT DETECTED NOT DETECTED Final   Candida krusei NOT DETECTED NOT DETECTED Final   Candida parapsilosis NOT DETECTED NOT DETECTED Final   Candida tropicalis NOT DETECTED NOT DETECTED Final    Comment: Performed at [facility], [address] [place], [place] [address]  MRSA PCR Screening     Status: None   Collection Time: 10/27/19  2:34 AM   Specimen: Nasopharyngeal  Result Value Ref Range Status   MRSA by PCR NEGATIVE NEGATIVE Final    Comment:          The GeneXpert MRSA Assay (FDA approved for NASAL specimens only), is one component of a  comprehensive MRSA colonization surveillance program. It is not intended to diagnose MRSA infection nor to guide or monitor treatment for MRSA infections. Performed at [facility], [address] [place], [place] [address]       Radiology Studies: No results found.     LOS: 7 days   Katherine Perez [employer] on [email or web address]  11/02/2019, 10:15 AM

## 2019-11-02 NOTE — Consult Note (Signed)
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 Abuse, anemia, anorexia, asthma, stage III CKD, laxative abuse, recurrent UTI/interstitial cystitis, Raynaud's disease, ongoing tobacco abuse admitted for hyponatremia. spironolactone 25 mg daily was started 7/8 and stopped 7/10  Goal of Therapy:  Electrolytes WNL  Plan:  No electrolyte replacement warranted today  F/u electrolytes in am 7/13  Dallie Piles ,PharmD Clinical Pharmacist 11/02/2019 12:47 PM

## 2019-11-02 NOTE — Progress Notes (Signed)
PHARMACY CONSULT NOTE FOR:  OUTPATIENT  PARENTERAL ANTIBIOTIC THERAPY (OPAT)  Indication: ESBL Klebsiella bacteremia Regimen: ertapenem 500 mg IV q24h End date: 11/07/19  IV antibiotic discharge orders are pended. To discharging provider:  please sign these orders via discharge navigator,  Select New Orders & click on the button choice - Manage This Unsigned Work.     Thank you for allowing pharmacy to be a part of this patient's care.  Tawnya Crook, PharmD 11/02/2019, 6:09 PM

## 2019-11-02 NOTE — Progress Notes (Signed)
[facility]  ROUNDING NOTE   Subjective:  Renal function did improve a bit as creatinine now down to 2.6. Potassium still slightly high at 5.3. Serum sodium up to 134. Still has elevated WBC count at 19.7.   Objective:  Vital signs in last 24 hours:  Temp:  [98.1 F (36.7 C)-99.2 F (37.3 C)] 98.1 F (36.7 C) (07/12 1223) Pulse Rate:  [88-95] 88 (07/12 1223) Resp:  [16-20] 16 (07/12 1223) BP: (110-125)/(66-73) 125/73 (07/12 1223) SpO2:  [100 %] 100 % (07/12 1223) Weight:  [60.3 kg] 60.3 kg (07/12 0500)  Weight change: 0 kg Filed Weights   10/29/19 0500 11/01/19 0426 11/02/19 0500  Weight: 57.1 kg 60.3 kg 60.3 kg    Intake/Output: I/O last 3 completed shifts: In: 1317.9 [P.O.:596; I.V.:473; IV Piggyback:248.9] Out: 2100 [Urine:2100]   Intake/Output this shift:  Total I/O In: 100 [IV Piggyback:100] Out: 200 [Urine:200]  Physical Exam: General: No acute distress  Head: Normocephalic, atraumatic. Moist oral mucosal membranes  Eyes: Anicteric  Neck: Supple, trachea midline  Lungs:  Clear to auscultation, normal effort  Heart: S1S2 no rubs  Abdomen:  Soft, nontender, bowel sounds present  Extremities: No peripheral edema.  Neurologic: Awake, alert, following commands  Skin: No lesions       Basic Metabolic Panel: Recent Labs  Lab 10/27/19 0545 10/27/19 0957 10/28/19 0654 10/28/19 0654 10/29/19 0421 10/29/19 0421 10/30/19 0510 10/30/19 0510 10/31/19 1057 11/01/19 0748 11/02/19 0627  NA 123*   < > 135   < > 131*  --  130*  --  131* 132* 134*  K 2.8*   < > 2.3*   < > 2.6*  --  3.0*  --  5.3* 5.2* 5.3*  CL 88*   < > 92*   < > 90*  --  92*  --  94* 96* 101  CO2 10*   < > 25   < > 24  --  26  --  24 25 22   GLUCOSE 93   < > 144*   < > 100*  --  100*  --  110* 101* 95  BUN 138*   < > 119*   < > 96*  --  82*  --  69* 61* 54*  CREATININE 4.71*   < > 4.18*   < > 3.84*  --  3.01*  --  2.79* 2.94* 2.62*  CALCIUM 6.9*   < > 6.9*   < > 7.4*   < > 8.3*    < > 9.4 9.1 8.9  MG 2.3  --  2.3   < > 1.8  --  1.9  --  1.6* 2.0 1.8  PHOS 7.2*  --  4.5  --  3.3  --   --   --   --   --   --    < > = values in this interval not displayed.    Liver Function Tests: Recent Labs  Lab 10/27/19 0545 10/29/19 0421 10/30/19 0510  AST 37 21 16  ALT 24 18 15   ALKPHOS 144* 120 98  BILITOT 1.8* 1.2 1.0  PROT 5.9* 5.8* 5.5*  ALBUMIN 2.2* 1.9* 1.9*   No results for input(s): LIPASE, AMYLASE in the last 168 hours. No results for input(s): AMMONIA in the last 168 hours.  CBC: Recent Labs  Lab 10/29/19 0421 10/30/19 0510 10/31/19 1057 11/01/19 0748 11/02/19 0627  WBC 17.2* 15.6* 18.3* 22.6* 19.7*  HGB 9.5* 10.2* 11.4* 9.4* 9.1*  HCT  25.8* 27.6* 32.8* 27.3* 26.2*  MCV 84.9 84.7 88.4 89.2 90.0  PLT 269 229 301 267 286    Cardiac Enzymes: No results for input(s): CKTOTAL, CKMB, CKMBINDEX, TROPONINI in the last 168 hours.  BNP: Invalid input(s): POCBNP  CBG: Recent Labs  Lab 10/28/19 1955 10/29/19 0355  GLUCAP 135* 109*    Microbiology: Results for orders placed or performed during the hospital encounter of 10/26/19  Group A Strep by PCR ([facility] Only)     Status: None   Collection Time: 10/26/19  1:20 PM   Specimen: Throat; Sterile Swab  Result Value Ref Range Status   Group A Strep by PCR NOT DETECTED NOT DETECTED Final    Comment: Performed at [facility], [address] [place], [place] [address]  Urine culture     Status: Abnormal   Collection Time: 10/26/19  1:21 PM   Specimen: Urine, Random  Result Value Ref Range Status   Specimen Description   Final    URINE, RANDOM Performed at [facility], [address], [place], [place] [address]    Special Requests   Final    NONE Performed at [facility], [address] [place],  [address]    Culture (A)  Final    >=100,000 COLONIES/mL KLEBSIELLA PNEUMONIAE Confirmed Extended Spectrum Beta-Lactamase Producer (ESBL).  In bloodstream  infections from ESBL organisms, carbapenems are preferred over piperacillin/tazobactam. They are shown to have a lower risk of mortality.    Report Status 10/29/2019 FINAL  Final   Organism ID, Bacteria KLEBSIELLA PNEUMONIAE (A)  Final      Susceptibility   Klebsiella pneumoniae - MIC*    AMPICILLIN >=32 RESISTANT Resistant     CEFAZOLIN >=64 RESISTANT Resistant     CEFTRIAXONE >=64 RESISTANT Resistant     CIPROFLOXACIN >=4 RESISTANT Resistant     GENTAMICIN <=1 SENSITIVE Sensitive     IMIPENEM <=0.25 SENSITIVE Sensitive     NITROFURANTOIN 64 INTERMEDIATE Intermediate     TRIMETH/SULFA >=320 RESISTANT Resistant     AMPICILLIN/SULBACTAM >=32 RESISTANT Resistant     PIP/TAZO 16 SENSITIVE Sensitive     * >=100,000 COLONIES/mL KLEBSIELLA PNEUMONIAE  SARS Coronavirus 2 by RT PCR (hospital order, performed in [facility] hospital lab) Nasopharyngeal Nasopharyngeal Swab     Status: None   Collection Time: 10/26/19  1:40 PM   Specimen: Nasopharyngeal Swab  Result Value Ref Range Status   SARS Coronavirus 2 NEGATIVE NEGATIVE Final    Comment: (NOTE) SARS-CoV-2 target nucleic acids are NOT DETECTED.  The SARS-CoV-2 RNA is generally detectable in upper and lower respiratory specimens during the acute phase of infection. The lowest concentration of SARS-CoV-2 viral copies this assay can detect is 250 copies / mL. A negative result does not preclude SARS-CoV-2 infection and should not be used as the sole basis for treatment or other patient management decisions.  A negative result may occur with improper specimen collection / handling, submission of specimen other than nasopharyngeal swab, presence of viral mutation(s) within the areas targeted by this assay, and inadequate number of viral copies (<250 copies / mL). A negative result must be combined with clinical observations, patient history, and epidemiological information.  Fact Sheet for Patients:    StrictlyIdeas.no  Fact Sheet for Healthcare Providers: BankingDealers.co.za  This test is not yet approved or  cleared by the [place] FDA and has been authorized for detection and/or diagnosis of SARS-CoV-2 by FDA under an Emergency Use Authorization (EUA).  This EUA  will remain in effect (meaning this test can be used) for the duration of the COVID-19 declaration under Section 564(b)(1) of the Act, 21 U.S.C. section 360bbb-3(b)(1), unless the authorization is terminated or revoked sooner.  Performed at [facility], [address] [place], [place] [address]   Culture, blood (routine x 2)     Status: Abnormal   Collection Time: 10/26/19  1:41 PM   Specimen: BLOOD  Result Value Ref Range Status   Specimen Description   Final    BLOOD RIGHT ANTECUBITAL Performed at [facility], [address] [place], [place] [address]    Special Requests   Final    BOTTLES DRAWN AEROBIC AND ANAEROBIC Blood Culture adequate volume Performed at [facility], [address] [place], [place] [address]    Culture  Setup Time   Final    GRAM NEGATIVE RODS AEROBIC BOTTLE ONLY CRITICAL VALUE NOTED.  VALUE IS CONSISTENT WITH PREVIOUSLY REPORTED AND CALLED VALUE.    Culture (A)  Final    KLEBSIELLA PNEUMONIAE SUSCEPTIBILITIES PERFORMED ON PREVIOUS CULTURE WITHIN THE LAST 5 DAYS. Performed at [facility], [place] [address], [place], [place] [address]    Report Status 10/31/2019 FINAL  Final  Culture, blood (routine x 2)     Status: Abnormal   Collection Time: 10/26/19  1:41 PM   Specimen: BLOOD  Result Value Ref Range Status   Specimen Description   Final    BLOOD BLOOD LEFT FOREARM Performed at [facility], [address], [place], [place] [address]    Special Requests   Final    BOTTLES DRAWN AEROBIC AND ANAEROBIC Blood Culture adequate volume Performed at [facility], [address], [place], [place] [address]    Culture  Setup Time   Final    GRAM NEGATIVE RODS IN BOTH AEROBIC AND ANAEROBIC BOTTLES CRITICAL RESULT CALLED TO, READ BACK BY AND VERIFIED WITH: [place] 10/28/19 SDR Performed at [facility], [place] [address] [place],  [address]    Culture (A)  Final    KLEBSIELLA PNEUMONIAE Confirmed Extended Spectrum Beta-Lactamase Producer (ESBL).  In bloodstream infections from ESBL organisms, carbapenems are preferred over piperacillin/tazobactam. They are shown to have a lower risk of mortality.    Report Status 10/31/2019 FINAL  Final   Organism ID, Bacteria KLEBSIELLA PNEUMONIAE  Final      Susceptibility   Klebsiella pneumoniae - MIC*    AMPICILLIN >=32 RESISTANT Resistant     CEFAZOLIN >=64 RESISTANT Resistant     CEFEPIME >=32 RESISTANT Resistant     CEFTAZIDIME RESISTANT Resistant     CEFTRIAXONE >=64 RESISTANT Resistant     CIPROFLOXACIN >=4 RESISTANT Resistant     GENTAMICIN <=1 SENSITIVE Sensitive     IMIPENEM <=0.25 SENSITIVE Sensitive     TRIMETH/SULFA >=320 RESISTANT Resistant     AMPICILLIN/SULBACTAM >=32 RESISTANT Resistant     PIP/TAZO 8 SENSITIVE Sensitive     * KLEBSIELLA PNEUMONIAE  Blood Culture ID Panel (Reflexed)     Status: Abnormal   Collection Time: 10/26/19  1:41 PM  Result Value Ref Range Status   Enterococcus species NOT DETECTED NOT DETECTED Final   Listeria monocytogenes NOT DETECTED NOT DETECTED Final   Staphylococcus species NOT DETECTED NOT DETECTED Final   Staphylococcus aureus (BCID) NOT DETECTED NOT DETECTED Final   Streptococcus species NOT DETECTED NOT DETECTED Final   Streptococcus agalactiae NOT DETECTED NOT DETECTED Final   Streptococcus pneumoniae  NOT DETECTED NOT DETECTED Final   Streptococcus pyogenes NOT DETECTED NOT DETECTED Final   Acinetobacter baumannii NOT DETECTED NOT DETECTED Final   Enterobacteriaceae species DETECTED (A) NOT DETECTED Final    Comment: Enterobacteriaceae  represent a large family of gram-negative bacteria, not a single organism. CRITICAL RESULT CALLED TO, READ BACK BY AND VERIFIED WITH:  SCOTT HALL AT 0559 10/28/19 SDR    Enterobacter cloacae complex NOT DETECTED NOT DETECTED Final   Escherichia coli NOT DETECTED NOT DETECTED Final   Klebsiella oxytoca NOT DETECTED NOT DETECTED Final   Klebsiella pneumoniae DETECTED (A) NOT DETECTED Final    Comment: CRITICAL RESULT CALLED TO, READ BACK BY AND VERIFIED WITH: SCOTT HALL AT 0559 10/28/19 SDR    Proteus species NOT DETECTED NOT DETECTED Final   Serratia marcescens NOT DETECTED NOT DETECTED Final   Carbapenem resistance NOT DETECTED NOT DETECTED Final   Haemophilus influenzae NOT DETECTED NOT DETECTED Final   Neisseria meningitidis NOT DETECTED NOT DETECTED Final   Pseudomonas aeruginosa NOT DETECTED NOT DETECTED Final   Candida albicans NOT DETECTED NOT DETECTED Final   Candida glabrata NOT DETECTED NOT DETECTED Final   Candida krusei NOT DETECTED NOT DETECTED Final   Candida parapsilosis NOT DETECTED NOT DETECTED Final   Candida tropicalis NOT DETECTED NOT DETECTED Final    Comment: Performed at [facility], [address] [place], [place] [address]  MRSA PCR Screening     Status: None   Collection Time: 10/27/19  2:34 AM   Specimen: Nasopharyngeal  Result Value Ref Range Status   MRSA by PCR NEGATIVE NEGATIVE Final    Comment:        The GeneXpert MRSA Assay (FDA approved for NASAL specimens only), is one component of a comprehensive MRSA colonization surveillance program. It is not intended to diagnose MRSA infection nor to guide or monitor treatment for MRSA infections. Performed at [facility], [address] [place], [place] [address]     Coagulation Studies: No results for input(s): LABPROT, INR in the last 72 hours.  Urinalysis: No results for input(s): COLORURINE, LABSPEC, PHURINE, GLUCOSEU, HGBUR, BILIRUBINUR, KETONESUR, PROTEINUR, UROBILINOGEN,  NITRITE, LEUKOCYTESUR in the last 72 hours.  Invalid input(s): APPERANCEUR    Imaging: No results found.   Medications:   . sodium chloride Stopped (11/01/19 1116)  . sodium chloride 50 mL/hr at 11/02/19 1449  . ertapenem     . bisacodyl  20 mg Oral QHS  . Chlorhexidine Gluconate Cloth  6 each Topical Q0600  . docusate sodium  100 mg Oral BID  . famotidine  20 mg Oral Daily  . fluticasone  2 spray Each Nare BID  . folic acid  1 mg Oral Daily  . heparin  5,000 Units Subcutaneous Q8H  . mometasone-formoterol  2 puff Inhalation BID  . multivitamin with minerals  1 tablet Oral Daily  . nicotine  21 mg Transdermal Daily  . pantoprazole  40 mg Oral Daily  . sodium chloride flush  10-40 mL Intracatheter Q12H  . sodium chloride flush  3 mL Intravenous Q12H  . tamsulosin  0.4 mg Oral Daily  . thiamine  100 mg Oral Daily   Or  . thiamine  100 mg Intravenous Daily   sodium chloride, acetaminophen **OR** acetaminophen, albuterol, ondansetron **OR** ondansetron (ZOFRAN) IV, sodium chloride flush, white petrolatum  Assessment/ Plan:  49 y.o. 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  Acute kidney injury likely multifactorial from volume depletion, sepsis, and NSAIDs leading to ATN.  Baseline creatinine 1.4 with EGFR 45 on 09/23/2019.  Renal ultrasound revealed increased echogenicity. -Renal function is slowly improving. Creatinine down to 2.6. She will need close outpatient monitoring of renal function.  2.  Hyponatremia. Serum sodium up to 134. Continue fluid restriction of 1.5 L/day.  3.  Sepsis secondary to Enterobacter species  and Klebsiella.  Antibiotic management per infectious disease. WBC count still elevated 19.7.  4.  Hyperkalemia. Patient was taken off of lactated Ringer's and transition to normal saline. Serum potassium still slightly high at 5.3. Previously had elevated potassium. Hesitant to give Lokelma.   LOS: 7 Katherine Perez 7/12/20213:51 PM

## 2019-11-02 NOTE — Progress Notes (Signed)
OT Cancellation Note  Patient Details Name: Katherine Perez MRN: [id number] DOB: 01-May-1970   Cancelled Treatment:    Reason Eval/Treat Not Completed: Medical issues which prohibited therapy. Chart reviewed - pt noted to have K+ 5.3 this AM. Per therapy guidelines will hold treatment until pt appropriate to participate in physical activity. Will follow up at later date/time as able.   Dessie Coma, M.S. OTR/L  11/02/19, 9:00 AM

## 2019-11-02 NOTE — Care Management (Signed)
TOC assessment complete.  Full note to follow

## 2019-11-02 NOTE — Progress Notes (Signed)
ID Pt says she is feeling better No nausea, vomiting or pain abdomen No fever Wants to go home  Patient Vitals for the past 24 hrs:  BP Temp Temp src Pulse Resp SpO2 Weight  11/02/19 0500 -- -- -- -- -- -- 60.3 kg  11/02/19 0339 110/66 99.2 F (37.3 C) Oral 95 -- 100 % --  11/01/19 1955 120/67 98.2 F (36.8 C) Oral 94 20 100 % --  11/01/19 1152 124/71 97.6 F (36.4 C) Oral 83 14 100 % --   0/e Awake and alert Chest b/l air entry Decreased rt base Hss1s2 Abd soft CNS non focal  CBC Latest Ref Rng & Units 11/02/2019 11/01/2019 10/31/2019  WBC 4.0 - 10.5 K/uL 19.7(H) 22.6(H) 18.3(H)  Hemoglobin 12.0 - 15.0 g/dL 9.1(L) 9.4(L) 11.4(L)  Hematocrit 36 - 46 % 26.2(L) 27.3(L) 32.8(L)  Platelets 150 - 400 K/uL 286 267 301   CMP Latest Ref Rng & Units 11/02/2019 11/01/2019 10/31/2019  Glucose 70 - 99 mg/dL 95 101(H) 110(H)  BUN 6 - 20 mg/dL 54(H) 61(H) 69(H)  Creatinine 0.44 - 1.00 mg/dL 2.62(H) 2.94(H) 2.79(H)  Sodium 135 - 145 mmol/L 134(L) 132(L) 131(L)  Potassium 3.5 - 5.1 mmol/L 5.3(H) 5.2(H) 5.3(H)  Chloride 98 - 111 mmol/L 101 96(L) 94(L)  CO2 22 - 32 mmol/L 22 25 24   Calcium 8.9 - 10.3 mg/dL 8.9 9.1 9.4  Total Protein 6.5 - 8.1 g/dL - - -  Total Bilirubin 0.3 - 1.2 mg/dL - - -  Alkaline Phos 38 - 126 U/L - - -  AST 15 - 41 U/L - - -  ALT 0 - 44 U/L - - -    Impression/recommendation Metabolic encephalopathy secondary to hyponatremia and uremic encephalopathy. Much improved  Klebsiella bacteremia Klebsiella urinary tract infectionwith possible pyelonephritis. Patient is currently on meropenem as the Klebsiella is an ESBL. The appropriate antibiotic was only started on 10/29/19 after 72 hrs. So will need 10 days of IV carbapenem ertapenem. End date will be 11/07/2019. POST void bladder scan No residue  Persistent leucocytosis- clinically she is improving- so observe-   Oral candidiasis- resolved fluconazole   History of interstitial cystitis followed at [facility] . History of recurrent UTI as per patient has been on multiple antibiotics in the past   Acute on chronic renal disease: Multifactorial: Seen by nephrology and he thinks the combination of infection, NSAID and volume depletion  CT scan yesterday showed significantly distended gallbladder which could be due to her not eating. seen by surgery -no need for any intervention as no cholecystitis CT scan also showed thickening of the colon wall. Suggesting colitis but patient clinically does not behave like colitis.  Discussed te management with patient and her husband and hospitalist

## 2019-11-03 LAB — CBC
HCT: 24 % — ABNORMAL LOW (ref 36.0–46.0)
Hemoglobin: 8.4 g/dL — ABNORMAL LOW (ref 12.0–15.0)
MCH: 31.5 pg (ref 26.0–34.0)
MCHC: 35 g/dL (ref 30.0–36.0)
MCV: 89.9 fL (ref 80.0–100.0)
Platelets: 331 10*3/uL (ref 150–400)
RBC: 2.67 MIL/uL — ABNORMAL LOW (ref 3.87–5.11)
RDW: 13.2 % (ref 11.5–15.5)
WBC: 17.1 10*3/uL — ABNORMAL HIGH (ref 4.0–10.5)
nRBC: 0 % (ref 0.0–0.2)

## 2019-11-03 LAB — BASIC METABOLIC PANEL
Anion gap: 9 (ref 5–15)
BUN: 53 mg/dL — ABNORMAL HIGH (ref 6–20)
CO2: 23 mmol/L (ref 22–32)
Calcium: 8.7 mg/dL — ABNORMAL LOW (ref 8.9–10.3)
Chloride: 104 mmol/L (ref 98–111)
Creatinine, Ser: 2.59 mg/dL — ABNORMAL HIGH (ref 0.44–1.00)
GFR calc Af Amer: 24 mL/min — ABNORMAL LOW (ref >60)
GFR calc non Af Amer: 21 mL/min — ABNORMAL LOW (ref >60)
Glucose, Bld: 91 mg/dL (ref 70–99)
Potassium: 4.9 mmol/L (ref 3.5–5.1)
Sodium: 136 mmol/L (ref 135–145)

## 2019-11-03 LAB — MAGNESIUM: Magnesium: 1.8 mg/dL (ref 1.7–2.4)

## 2019-11-03 MED ORDER — ERTAPENEM IV (FOR PTA / DISCHARGE USE ONLY): 500.0000 mg | INTRAVENOUS | 0 refills | Status: AC

## 2019-11-03 MED ORDER — THIAMINE HCL 100 MG PO TABS: 100.0000 mg | ORAL_TABLET | Freq: Every day | ORAL | 0 refills | Status: DC

## 2019-11-03 MED ORDER — SODIUM CHLORIDE 0.9 % IV SOLN
500.0000 mg | INTRAVENOUS | Status: DC
  Administered 2019-11-03: 500 mg via INTRAVENOUS
  Filled 2019-11-03: qty 0.5

## 2019-11-03 NOTE — Consult Note (Signed)
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 Abuse, anemia, anorexia, asthma, stage III CKD, laxative abuse, recurrent UTI/interstitial cystitis, Raynaud's disease, ongoing tobacco abuse admitted for hyponatremia. spironolactone 25 mg daily was started 7/8 and stopped 7/10. On NS 50 mL/hr. Potassium trending down.   Goal of Therapy:  Electrolytes WNL  Plan:  No electrolyte replacement warranted today  F/u electrolytes in am 7/14  Oswald Hillock ,PharmD Clinical Pharmacist 11/03/2019 8:31 AM

## 2019-11-03 NOTE — Progress Notes (Signed)
Katherine Perez  A and O x 4 VSS. Pt tolerating diet well. No complaints of pain or nausea. IV removed intact, prescriptions given. Pt voices understanding of discharge instructions with no further questions. Pt discharged via wheelchair with axillary.   Allergies as of 11/03/2019      Reactions   Cetirizine Hives   Diazepam Other (See Comments)   Depression   Baclofen Anxiety, Other (See Comments)   AMS - "Really messed me up, caused me to drop things"      Medication List    STOP taking these medications   Diclofenac-miSOPROStol 75-0.2 MG Tbec   trimethoprim 100 MG tablet Commonly known as: TRIMPEX     TAKE these medications   albuterol 108 (90 Base) MCG/ACT inhaler Commonly known as: VENTOLIN HFA Inhale 2 puffs into the lungs every 4 (four) hours as needed.   ertapenem  IVPB Commonly known as: INVANZ Inject 500 mg into the vein daily for 4 days. Indication:  ESBL Klebsiella bacteremia First Dose: no Last Day of Therapy:  11/07/19  Labs on Thursday  while on IV antibiotics: _X_ CBC with differential _X_ CMP _X_ Please pull PICC at completion of IV antibiotics  Fax weekly labs to [phone]  Clinic Follow Up Appt:in 1 week Call [phone] to make appt   Method of administration: Mini-Bag Plus / Gravity Method of administration may be changed at the discretion of home infusion pharmacist based upon assessment of the patient and/or caregiver's ability to self-administer the medication ordered. Start taking on: November 04, 2019   famotidine 40 MG tablet Commonly known as: PEPCID Take 40 mg by mouth daily.   fexofenadine 180 MG tablet Commonly known as: ALLEGRA Take 180 mg by mouth daily as needed for allergies.   Fiber 625 MG Tabs Take 2 capsules by mouth 2 (two) times daily.   Fiber Laxative 0.52 g capsule Generic drug: psyllium Take 2 capsules by mouth 2 (two) times daily.   fluticasone 50 MCG/ACT nasal spray Commonly known as: FLONASE Place 2 sprays  into both nostrils 2 (two) times daily.   Fluticasone-Salmeterol 100-50 MCG/DOSE Aepb Commonly known as: ADVAIR Inhale 1 puff into the lungs 2 (two) times daily.   Fluticasone-Salmeterol 250-50 MCG/DOSE Aepb Commonly known as: ADVAIR Inhale 1 puff into the lungs 2 (two) times daily.   gabapentin 300 MG capsule Commonly known as: NEURONTIN Take 300 mg by mouth 5 (five) times daily as needed.   metoCLOPramide 10 MG tablet Commonly known as: REGLAN Take 20 mg by mouth in the morning and at bedtime.   ondansetron 8 MG tablet Commonly known as: ZOFRAN Take 16 mg by mouth 2 (two) times daily.   promethazine 25 MG tablet Commonly known as: PHENERGAN Take 25 mg by mouth every 8 (eight) hours as needed for nausea or vomiting.   RABEprazole 20 MG tablet Commonly known as: ACIPHEX Take 20 mg by mouth in the morning and at bedtime.   tamsulosin 0.4 MG Caps capsule Commonly known as: FLOMAX Take 0.4 mg by mouth daily.   theophylline 400 MG 24 hr tablet Commonly known as: UNIPHYL Take 400 mg by mouth 2 (two) times daily.   thiamine 100 MG tablet Take 1 tablet (100 mg total) by mouth daily.   traMADol 50 MG tablet Commonly known as: ULTRAM Take 50 mg by mouth 3 (three) times daily as needed.   Trulance 3 MG Tabs Generic drug: Plecanatide Take 3 mg by mouth daily at 6 (six) AM.  Discharge Care Instructions  (From admission, onward)         Start     Ordered   11/03/19 0000  Change dressing on IV access line weekly and PRN  (Home infusion instructions - [facility] )        11/03/19 1002          Vitals:   11/03/19 0734 11/03/19 1153  BP: 126/68 122/71  Pulse: 89 91  Resp: 20 20  Temp: 98.1 F (36.7 C) 97.7 F (36.5 C)  SpO2: 100% 100%    Katherine Perez Katherine Perez

## 2019-11-03 NOTE — Progress Notes (Signed)
[facility]  ROUNDING NOTE   Subjective:  Patient seen and evaluated at bedside. Renal function very slowly improving. Creatinine currently 2.59. Potassium down to 4.9. Serum sodium normalized to 136.   Objective:  Vital signs in last 24 hours:  Temp:  [97.7 F (36.5 C)-98.2 F (36.8 C)] 98.1 F (36.7 C) (07/13 0734) Pulse Rate:  [88-98] 89 (07/13 0734) Resp:  [16-20] 20 (07/13 0734) BP: (112-132)/(64-73) 126/68 (07/13 0734) SpO2:  [100 %] 100 % (07/13 0734)  Weight change:  Filed Weights   10/29/19 0500 11/01/19 0426 11/02/19 0500  Weight: 57.1 kg 60.3 kg 60.3 kg    Intake/Output: I/O last 3 completed shifts: In: 756.9 [I.V.:606.9; IV Piggyback:150] Out: 1500 [Urine:1500]   Intake/Output this shift:  Total I/O In: 240 [P.O.:240] Out: -   Physical Exam: General: No acute distress  Head: Normocephalic, atraumatic. Moist oral mucosal membranes  Eyes: Anicteric  Neck: Supple, trachea midline  Lungs:  Clear to auscultation, normal effort  Heart: S1S2 no rubs  Abdomen:  Soft, nontender, bowel sounds present  Extremities: No peripheral edema.  Neurologic: Awake, alert, following commands  Skin: No lesions       Basic Metabolic Panel: Recent Labs  Lab 10/28/19 0654 10/28/19 0654 10/29/19 0421 10/29/19 0421 10/30/19 0510 10/30/19 0510 10/31/19 1057 10/31/19 1057 11/01/19 0748 11/02/19 0627 11/03/19 0506  NA 135   < > 131*   < > 130*  --  131*  --  132* 134* 136  K 2.3*   < > 2.6*   < > 3.0*  --  5.3*  --  5.2* 5.3* 4.9  CL 92*   < > 90*   < > 92*  --  94*  --  96* 101 104  CO2 25   < > 24   < > 26  --  24  --  25 22 23   GLUCOSE 144*   < > 100*   < > 100*  --  110*  --  101* 95 91  BUN 119*   < > 96*   < > 82*  --  69*  --  61* 54* 53*  CREATININE 4.18*   < > 3.84*   < > 3.01*  --  2.79*  --  2.94* 2.62* 2.59*  CALCIUM 6.9*   < > 7.4*   < > 8.3*   < > 9.4   < > 9.1 8.9 8.7*  MG 2.3   < > 1.8   < > 1.9  --  1.6*  --  2.0 1.8 1.8  PHOS 4.5   --  3.3  --   --   --   --   --   --   --   --    < > = values in this interval not displayed.    Liver Function Tests: Recent Labs  Lab 10/29/19 0421 10/30/19 0510  AST 21 16  ALT 18 15  ALKPHOS 120 98  BILITOT 1.2 1.0  PROT 5.8* 5.5*  ALBUMIN 1.9* 1.9*   No results for input(s): LIPASE, AMYLASE in the last 168 hours. No results for input(s): AMMONIA in the last 168 hours.  CBC: Recent Labs  Lab 10/30/19 0510 10/31/19 1057 11/01/19 0748 11/02/19 0627 11/03/19 0506  WBC 15.6* 18.3* 22.6* 19.7* 17.1*  HGB 10.2* 11.4* 9.4* 9.1* 8.4*  HCT 27.6* 32.8* 27.3* 26.2* 24.0*  MCV 84.7 88.4 89.2 90.0 89.9  PLT 229 301 267 286 331    Cardiac  Enzymes: No results for input(s): CKTOTAL, CKMB, CKMBINDEX, TROPONINI in the last 168 hours.  BNP: Invalid input(s): POCBNP  CBG: Recent Labs  Lab 10/28/19 1955 10/29/19 0355  GLUCAP 135* 109*    Microbiology: Results for orders placed or performed during the hospital encounter of 10/26/19  Group A Strep by PCR ([facility] Only)     Status: None   Collection Time: 10/26/19  1:20 PM   Specimen: Throat; Sterile Swab  Result Value Ref Range Status   Group A Strep by PCR NOT DETECTED NOT DETECTED Final    Comment: Performed at [facility], [address], [place], [place] [address]  Urine culture     Status: Abnormal   Collection Time: 10/26/19  1:21 PM   Specimen: Urine, Random  Result Value Ref Range Status   Specimen Description   Final    URINE, RANDOM Performed at [facility], [address], [place], [place] [address]    Special Requests   Final    NONE Performed at [facility], [address] [place], [place] [address]    Culture (A)  Final    >=100,000 COLONIES/mL KLEBSIELLA PNEUMONIAE Confirmed Extended Spectrum Beta-Lactamase Producer (ESBL).  In bloodstream infections from ESBL organisms, carbapenems are preferred over piperacillin/tazobactam. They are shown to have a lower risk of  mortality.    Report Status 10/29/2019 FINAL  Final   Organism ID, Bacteria KLEBSIELLA PNEUMONIAE (A)  Final      Susceptibility   Klebsiella pneumoniae - MIC*    AMPICILLIN >=32 RESISTANT Resistant     CEFAZOLIN >=64 RESISTANT Resistant     CEFTRIAXONE >=64 RESISTANT Resistant     CIPROFLOXACIN >=4 RESISTANT Resistant     GENTAMICIN <=1 SENSITIVE Sensitive     IMIPENEM <=0.25 SENSITIVE Sensitive     NITROFURANTOIN 64 INTERMEDIATE Intermediate     TRIMETH/SULFA >=320 RESISTANT Resistant     AMPICILLIN/SULBACTAM >=32 RESISTANT Resistant     PIP/TAZO 16 SENSITIVE Sensitive     * >=100,000 COLONIES/mL KLEBSIELLA PNEUMONIAE  SARS Coronavirus 2 by RT PCR (hospital order, performed in [place] hospital lab) Nasopharyngeal Nasopharyngeal Swab     Status: None   Collection Time: 10/26/19  1:40 PM   Specimen: Nasopharyngeal Swab  Result Value Ref Range Status   SARS Coronavirus 2 NEGATIVE NEGATIVE Final    Comment: (NOTE) SARS-CoV-2 target nucleic acids are NOT DETECTED.  The SARS-CoV-2 RNA is generally detectable in upper and lower respiratory specimens during the acute phase of infection. The lowest concentration of SARS-CoV-2 viral copies this assay can detect is 250 copies / mL. A negative result does not preclude SARS-CoV-2 infection and should not be used as the sole basis for treatment or other patient management decisions.  A negative result may occur with improper specimen collection / handling, submission of specimen other than nasopharyngeal swab, presence of viral mutation(s) within the areas targeted by this assay, and inadequate number of viral copies (<250 copies / mL). A negative result must be combined with clinical observations, patient history, and epidemiological information.  Fact Sheet for Patients:   StrictlyIdeas.no  Fact Sheet for Healthcare Providers: BankingDealers.co.za  This test is not yet approved or   cleared by the [place] FDA and has been authorized for detection and/or diagnosis of SARS-CoV-2 by FDA under an Emergency Use Authorization (EUA).  This EUA will remain in effect (meaning this test can be used) for the duration of the COVID-19 declaration under Section 564(b)(1) of the Act,  21 U.S.C. section 360bbb-3(b)(1), unless the authorization is terminated or revoked sooner.  Performed at [facility], [address] [place], [place] [address]   Culture, blood (routine x 2)     Status: Abnormal   Collection Time: 10/26/19  1:41 PM   Specimen: BLOOD  Result Value Ref Range Status   Specimen Description   Final    BLOOD RIGHT ANTECUBITAL Performed at [facility], [address], [place], [place] [address]    Special Requests   Final    BOTTLES DRAWN AEROBIC AND ANAEROBIC Blood Culture adequate volume Performed at [facility], [address] [place], [place] [address]    Culture  Setup Time   Final    GRAM NEGATIVE RODS AEROBIC BOTTLE ONLY CRITICAL VALUE NOTED.  VALUE IS CONSISTENT WITH PREVIOUSLY REPORTED AND CALLED VALUE.    Culture (A)  Final    KLEBSIELLA PNEUMONIAE SUSCEPTIBILITIES PERFORMED ON PREVIOUS CULTURE WITHIN THE LAST 5 DAYS. Performed at [facility], [place] [address], [place], [place] [address]    Report Status 10/31/2019 FINAL  Final  Culture, blood (routine x 2)     Status: Abnormal   Collection Time: 10/26/19  1:41 PM   Specimen: BLOOD  Result Value Ref Range Status   Specimen Description   Final    BLOOD BLOOD LEFT FOREARM Performed at [facility], [address] [place], [place] [address]    Special Requests   Final    BOTTLES DRAWN AEROBIC AND ANAEROBIC Blood Culture adequate volume Performed at [facility], [address] [place], [place] [address]    Culture  Setup Time   Final    GRAM NEGATIVE RODS IN BOTH AEROBIC AND ANAEROBIC BOTTLES CRITICAL RESULT CALLED TO, READ BACK  BY AND VERIFIED WITH: Loda 10/28/19 SDR Performed at [facility], [place] [address] [place], [place] [address]    Culture (A)  Final    KLEBSIELLA PNEUMONIAE Confirmed Extended Spectrum Beta-Lactamase Producer (ESBL).  In bloodstream infections from ESBL organisms, carbapenems are preferred over piperacillin/tazobactam. They are shown to have a lower risk of mortality.    Report Status 10/31/2019 FINAL  Final   Organism ID, Bacteria KLEBSIELLA PNEUMONIAE  Final      Susceptibility   Klebsiella pneumoniae - MIC*    AMPICILLIN >=32 RESISTANT Resistant     CEFAZOLIN >=64 RESISTANT Resistant     CEFEPIME >=32 RESISTANT Resistant     CEFTAZIDIME RESISTANT Resistant     CEFTRIAXONE >=64 RESISTANT Resistant     CIPROFLOXACIN >=4 RESISTANT Resistant     GENTAMICIN <=1 SENSITIVE Sensitive     IMIPENEM <=0.25 SENSITIVE Sensitive     TRIMETH/SULFA >=320 RESISTANT Resistant     AMPICILLIN/SULBACTAM >=32 RESISTANT Resistant     PIP/TAZO 8 SENSITIVE Sensitive     * KLEBSIELLA PNEUMONIAE  Blood Culture ID Panel (Reflexed)     Status: Abnormal   Collection Time: 10/26/19  1:41 PM  Result Value Ref Range Status   Enterococcus species NOT DETECTED NOT DETECTED Final   Listeria monocytogenes NOT DETECTED NOT DETECTED Final   Staphylococcus species NOT DETECTED NOT DETECTED Final   Staphylococcus aureus (BCID) NOT DETECTED NOT DETECTED Final   Streptococcus species NOT DETECTED NOT DETECTED Final   Streptococcus agalactiae NOT DETECTED NOT DETECTED Final   Streptococcus pneumoniae NOT DETECTED NOT DETECTED Final   Streptococcus pyogenes NOT DETECTED NOT DETECTED Final   Acinetobacter baumannii NOT DETECTED NOT DETECTED Final  Enterobacteriaceae species DETECTED (A) NOT DETECTED Final    Comment: Enterobacteriaceae represent a large family of gram-negative bacteria, not a single organism. CRITICAL RESULT CALLED TO, READ BACK BY AND VERIFIED WITH:  SCOTT HALL AT 0559 10/28/19 SDR     Enterobacter cloacae complex NOT DETECTED NOT DETECTED Final   Escherichia coli NOT DETECTED NOT DETECTED Final   Klebsiella oxytoca NOT DETECTED NOT DETECTED Final   Klebsiella pneumoniae DETECTED (A) NOT DETECTED Final    Comment: CRITICAL RESULT CALLED TO, READ BACK BY AND VERIFIED WITH: SCOTT HALL AT 0559 10/28/19 SDR    Proteus species NOT DETECTED NOT DETECTED Final   Serratia marcescens NOT DETECTED NOT DETECTED Final   Carbapenem resistance NOT DETECTED NOT DETECTED Final   Haemophilus influenzae NOT DETECTED NOT DETECTED Final   Neisseria meningitidis NOT DETECTED NOT DETECTED Final   Pseudomonas aeruginosa NOT DETECTED NOT DETECTED Final   Candida albicans NOT DETECTED NOT DETECTED Final   Candida glabrata NOT DETECTED NOT DETECTED Final   Candida krusei NOT DETECTED NOT DETECTED Final   Candida parapsilosis NOT DETECTED NOT DETECTED Final   Candida tropicalis NOT DETECTED NOT DETECTED Final    Comment: Performed at [facility], [address] [place], [place] [address]  MRSA PCR Screening     Status: None   Collection Time: 10/27/19  2:34 AM   Specimen: Nasopharyngeal  Result Value Ref Range Status   MRSA by PCR NEGATIVE NEGATIVE Final    Comment:        The GeneXpert MRSA Assay (FDA approved for NASAL specimens only), is one component of a comprehensive MRSA colonization surveillance program. It is not intended to diagnose MRSA infection nor to guide or monitor treatment for MRSA infections. Performed at [facility], [address] [place], [place] [address]     Coagulation Studies: No results for input(s): LABPROT, INR in the last 72 hours.  Urinalysis: No results for input(s): COLORURINE, LABSPEC, PHURINE, GLUCOSEU, HGBUR, BILIRUBINUR, KETONESUR, PROTEINUR, UROBILINOGEN, NITRITE, LEUKOCYTESUR in the last 72 hours.  Invalid input(s): APPERANCEUR    Imaging: No results found.   Medications:   . sodium chloride Stopped (11/01/19  1116)  . sodium chloride 50 mL/hr at 11/03/19 0328  . ertapenem     . bisacodyl  20 mg Oral QHS  . Chlorhexidine Gluconate Cloth  6 each Topical Q0600  . docusate sodium  100 mg Oral BID  . famotidine  20 mg Oral Daily  . fluticasone  2 spray Each Nare BID  . folic acid  1 mg Oral Daily  . heparin  5,000 Units Subcutaneous Q8H  . mometasone-formoterol  2 puff Inhalation BID  . multivitamin with minerals  1 tablet Oral Daily  . nicotine  21 mg Transdermal Daily  . pantoprazole  40 mg Oral Daily  . sodium chloride flush  10-40 mL Intracatheter Q12H  . sodium chloride flush  3 mL Intravenous Q12H  . tamsulosin  0.4 mg Oral Daily  . thiamine  100 mg Oral Daily   Or  . thiamine  100 mg Intravenous Daily   sodium chloride, acetaminophen **OR** acetaminophen, albuterol, ondansetron **OR** ondansetron (ZOFRAN) IV, sodium chloride flush, white petrolatum  Assessment/ Plan:  49 y.o. 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  Acute kidney injury likely multifactorial from volume depletion, sepsis, and NSAIDs leading to ATN.  Baseline creatinine 1.4 with EGFR 45 on 09/23/2019.  Renal ultrasound revealed increased echogenicity. -Renal function continues to very slowly improve.  Creatinine down to 2.59.  Continue to monitor renal parameters as an outpatient.  2.  Hyponatremia.  Serum sodium normalized to 136.  Continue to monitor this as an outpatient.  3.  Sepsis secondary to Enterobacter species and Klebsiella.  Antibiotic management per infectious disease.  WBC count slowly declining and down to 17.1.  4.  Hyperkalemia.  Potassium now  normalized to 4.9.but she is off lactated Ringer's.  Continue to monitor as an outpatient.  5.  We will arrange for outpatient follow-up in our office with Dr. Candiss Norse.   LOS: 8 Truong Delcastillo 7/13/202111:10 AM

## 2019-11-03 NOTE — Discharge Summary (Signed)
[facility]  Physician Discharge Summary   Patient ID: Ruvi Fullenwider MRN: [id number] DOB/AGE: 06/03/1970 49 y.o.  Admit date: 10/26/2019 Discharge date: 11/03/2019  PCP: Patient, No Pcp Per  DISCHARGE DIAGNOSES:  Acute on chronic kidney disease stage IIIa Hyponatremia, improved Klebsiella bacteremia secondary to UTI ESBL Klebsiella UTI History of chronic interstitial cystitis Sepsis, present on admission, resolved Colopathy, improved Elevated anion gap metabolic acidosis, resolved Distended gallbladder Oral candidiasis Aspiration pneumonitis Normocytic anemia Chronic constipation and anorexia History of asthma History of alcohol abuse  RECOMMENDATIONS FOR OUTPATIENT FOLLOW UP: 1. Nephrology to arrange outpatient follow-up for blood work and clinic visit 2. Patient instructed to stay off of recreational substances 3. Home IV antibiotics via home health 4. Outpatient follow-up with general surgery for distended gallbladder.    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 Abuse, anemia, anorexia, asthma, stage III CKD, laxative abuse, recurrent UTI/interstitial cystitis, Raynaud's disease, ongoing tobacco abuse presented to [facility] ED with poor appetite, decreased energy lethargy and confusion.  She was found to have severe hyponatremia, urine concerning for infection chest x-ray also concerning for infection.  She was hospitalized for  further management.  Consultants:  Nephrology.   Critical care medicine. Infectious disease General surgery   HOSPITAL COURSE:   Acute on chronic kidney disease stage III/electrolyte abnormality Creatinine was 1.4 on June 2.  Presented with a creatinine of 4.88.  Most likely due to NSAID use and dehydration.  CT scan did not show any hydronephrosis.  Tiny bilateral nonobstructing renal calculi were noted. Patient was started on IV fluids with improvement in renal function.    On 7/11 creatinine went up for unclear reason to 2.94.  Started back on IV fluids.    Creatinine came down to 2.6 and then 2.59 this morning.  Potassium has improved.  She was mildly hyperkalemic.  Initially she was hypokalemic so may have been aggressively repleted.   Okay per nephrology for discharge.  They will arrange outpatient follow-up.  Patient told to avoid use of NSAIDs.    Klebsiella bacteremia/UTI with ESBL Klebsiella/chronic interstitial cystitis/sepsis present on admission. Patient was initially on Unasyn for aspiration pneumonia.  Subsequently on ceftriaxone and metronidazole.  Her urine culture is growing ESBL Klebsiella.  She was changed over to meropenem.  She is also bacteremic with the same organism. Source of the infection is urine. CT scan of the abdomen pelvis did not show any acute issues in the GU tract however an enlarged gallbladder was noted.  Tiny bilateral nonobstructing renal calculi were seen.  Sepsis physiology has resolved. Patient being followed by infectious disease.    WBC slowly improving.  Per infectious disease she will need antibiotics till July 17.  She has a midline which can be used for same.  Home health has been ordered.     Hyponatremia Presented with a sodium of 118 with previously recorded sodium level of 134 on June 2.  Etiology thought to be multifactorial including dehydration, alcohol abuse.  Patient was given IV fluids with no improvement in sodium level.   Nephrology was consulted.  Urine osmolality  noted to be 233.  Serum osmolality 295.  Patient briefly required 3% saline with improvement in sodium.  She was also placed on fluid restriction. Sodium level has improved to 136 this morning.  Acute metabolic encephalopathy Most likely due to combination of hyponatremia, infection, acidosis, alcohol withdrawal.  Mental status has improved when she seems to be close to her baseline.  Continue thiamine.    Elevated anion gap metabolic acidosis Most likely due to acute kidney injury and hypovolemia.  Alcohol likely contributing as well.  She is not a diabetic.  Ketones noted in the urine however this could be due to starvation.  Metabolic acidosis has resolved. .  Significantly distended gallbladder Significantly distended gallbladder was noted on the CT scan.  She was noted to have a abdominal tenderness.  Her confusion made it difficult to elicit history.  General surgery was consulted.  Ultrasound of the abdomen was done.  It does not appear that her gallbladder is an active issue currently.  Her LFTs are normal.  No plan for any surgical intervention currently.    Oral candidiasis Complete a 7-day course of Diflucan.  Aspiration pneumonitis Respiratory status is stable.  She saturating normal on room air.  Normocytic anemia Likely due to acute illness.  No evidence for overt bleeding.  Hemoglobin is stable for the most part.  Chronic constipation and anorexia Bowel regimen to continue.  CT scan also showed findings suggestive of colitis.  Patient apparently has chronic GI issues with chronic constipation.  She is followed by gastroenterology in [place].  Here in the hospital patient does not have any diarrhea.  No other GI symptoms or signs noted.  No further work-up at this time.  Discussed with her husband.  She can follow-up with her gastroenterologist at [facility].  History of asthma May resume her home medications.  Tobacco  abuse Nicotine patch  History of alcohol abuse Continue CIWA protocol.  Prolonged QTC Likely due to abnormal electrolytes.  EKG repeated 7/11 and was noted to have normal QTC.   Discussed with the patient and her husband today.  Okay for discharge home.   PERTINENT LABS:  The results of significant diagnostics from this hospitalization (including imaging, microbiology, ancillary and laboratory) are listed below for reference.    Microbiology: Recent Results (from the past 240 hour(s))  Group A Strep by PCR ([facility] Only)     Status: None   Collection Time: 10/26/19  1:20 PM   Specimen: Throat; Sterile Swab  Result Value Ref Range Status   Group A Strep by PCR NOT DETECTED NOT DETECTED Final    Comment: Performed at [facility], [address], [place], [place] [address]  Urine culture     Status: Abnormal   Collection Time: 10/26/19  1:21 PM   Specimen: Urine, Random  Result Value Ref Range Status   Specimen Description   Final    URINE, RANDOM Performed at [facility], [address], [place], [place] [address]    Special Requests   Final    NONE Performed at [facility], [address] [place], [place] [address]    Culture (A)  Final    >=100,000 COLONIES/mL KLEBSIELLA PNEUMONIAE Confirmed Extended Spectrum Beta-Lactamase Producer (ESBL).  In bloodstream infections from ESBL organisms, carbapenems are preferred over piperacillin/tazobactam. They are shown to have a lower risk of mortality.    Report Status 10/29/2019 FINAL  Final   Organism ID, Bacteria KLEBSIELLA PNEUMONIAE (A)  Final  Susceptibility   Klebsiella pneumoniae - MIC*    AMPICILLIN >=32 RESISTANT Resistant     CEFAZOLIN >=64 RESISTANT Resistant     CEFTRIAXONE >=64 RESISTANT Resistant     CIPROFLOXACIN >=4 RESISTANT Resistant     GENTAMICIN <=1 SENSITIVE Sensitive     IMIPENEM <=0.25 SENSITIVE Sensitive     NITROFURANTOIN 64 INTERMEDIATE Intermediate      TRIMETH/SULFA >=320 RESISTANT Resistant     AMPICILLIN/SULBACTAM >=32 RESISTANT Resistant     PIP/TAZO 16 SENSITIVE Sensitive     * >=100,000 COLONIES/mL KLEBSIELLA PNEUMONIAE  SARS Coronavirus 2 by RT PCR (hospital order, performed in [place] hospital lab) Nasopharyngeal Nasopharyngeal Swab     Status: None   Collection Time: 10/26/19  1:40 PM   Specimen: Nasopharyngeal Swab  Result Value Ref Range Status   SARS Coronavirus 2 NEGATIVE NEGATIVE Final    Comment: (NOTE) SARS-CoV-2 target nucleic acids are NOT DETECTED.  The SARS-CoV-2 RNA is generally detectable in upper and lower respiratory specimens during the acute phase of infection. The lowest concentration of SARS-CoV-2 viral copies this assay can detect is 250 copies / mL. A negative result does not preclude SARS-CoV-2 infection and should not be used as the sole basis for treatment or other patient management decisions.  A negative result may occur with improper specimen collection / handling, submission of specimen other than nasopharyngeal swab, presence of viral mutation(s) within the areas targeted by this assay, and inadequate number of viral copies (<250 copies / mL). A negative result must be combined with clinical observations, patient history, and epidemiological information.  Fact Sheet for Patients:   StrictlyIdeas.no  Fact Sheet for Healthcare Providers: BankingDealers.co.za  This test is not yet approved or  cleared by the [place] FDA and has been authorized for detection and/or diagnosis of SARS-CoV-2 by FDA under an Emergency Use Authorization (EUA).  This EUA will remain in effect (meaning this test can be used) for the duration of the COVID-19 declaration under Section 564(b)(1) of the Act, 21 U.S.C. section 360bbb-3(b)(1), unless the authorization is terminated or revoked sooner.  Performed at [facility], [address]  [place], [place] [address]   Culture, blood (routine x 2)     Status: Abnormal   Collection Time: 10/26/19  1:41 PM   Specimen: BLOOD  Result Value Ref Range Status   Specimen Description   Final    BLOOD RIGHT ANTECUBITAL Performed at [facility], [address], [place], [place] [address]    Special Requests   Final    BOTTLES DRAWN AEROBIC AND ANAEROBIC Blood Culture adequate volume Performed at [facility], [address], [place], [place] [address]    Culture  Setup Time   Final    GRAM NEGATIVE RODS AEROBIC BOTTLE ONLY CRITICAL VALUE NOTED.  VALUE IS CONSISTENT WITH PREVIOUSLY REPORTED AND CALLED VALUE.    Culture (A)  Final    KLEBSIELLA PNEUMONIAE SUSCEPTIBILITIES PERFORMED ON PREVIOUS CULTURE WITHIN THE LAST 5 DAYS. Performed at [facility], [place] [address], [place], [place] [address]    Report Status 10/31/2019 FINAL  Final  Culture, blood (routine x 2)     Status: Abnormal   Collection Time: 10/26/19  1:41 PM   Specimen: BLOOD  Result Value Ref Range Status   Specimen Description   Final    BLOOD BLOOD LEFT FOREARM Performed at [facility], [address], [place], [place] [address]    Special Requests   Final  BOTTLES DRAWN AEROBIC AND ANAEROBIC Blood Culture adequate volume Performed at [facility], [address] [place], [place] [address]    Culture  Setup Time   Final    GRAM NEGATIVE RODS IN BOTH AEROBIC AND ANAEROBIC BOTTLES CRITICAL RESULT CALLED TO, READ BACK BY AND VERIFIED WITH: [place] 10/28/19 SDR Performed at [facility], [place] [address] [place], [place] [address]    Culture (A)  Final    KLEBSIELLA PNEUMONIAE Confirmed Extended Spectrum Beta-Lactamase Producer (ESBL).  In bloodstream infections from ESBL organisms, carbapenems are preferred over piperacillin/tazobactam. They are shown to have a lower risk of mortality.    Report Status 10/31/2019 FINAL  Final   Organism ID,  Bacteria KLEBSIELLA PNEUMONIAE  Final      Susceptibility   Klebsiella pneumoniae - MIC*    AMPICILLIN >=32 RESISTANT Resistant     CEFAZOLIN >=64 RESISTANT Resistant     CEFEPIME >=32 RESISTANT Resistant     CEFTAZIDIME RESISTANT Resistant     CEFTRIAXONE >=64 RESISTANT Resistant     CIPROFLOXACIN >=4 RESISTANT Resistant     GENTAMICIN <=1 SENSITIVE Sensitive     IMIPENEM <=0.25 SENSITIVE Sensitive     TRIMETH/SULFA >=320 RESISTANT Resistant     AMPICILLIN/SULBACTAM >=32 RESISTANT Resistant     PIP/TAZO 8 SENSITIVE Sensitive     * KLEBSIELLA PNEUMONIAE  Blood Culture ID Panel (Reflexed)     Status: Abnormal   Collection Time: 10/26/19  1:41 PM  Result Value Ref Range Status   Enterococcus species NOT DETECTED NOT DETECTED Final   Listeria monocytogenes NOT DETECTED NOT DETECTED Final   Staphylococcus species NOT DETECTED NOT DETECTED Final   Staphylococcus aureus (BCID) NOT DETECTED NOT DETECTED Final   Streptococcus species NOT DETECTED NOT DETECTED Final   Streptococcus agalactiae NOT DETECTED NOT DETECTED Final   Streptococcus pneumoniae NOT DETECTED NOT DETECTED Final   Streptococcus pyogenes NOT DETECTED NOT DETECTED Final   Acinetobacter baumannii NOT DETECTED NOT DETECTED Final   Enterobacteriaceae species DETECTED (A) NOT DETECTED Final    Comment: Enterobacteriaceae represent a large family of gram-negative bacteria, not a single organism. CRITICAL RESULT CALLED TO, READ BACK BY AND VERIFIED WITH:  SCOTT HALL AT 0559 10/28/19 SDR    Enterobacter cloacae complex NOT DETECTED NOT DETECTED Final   Escherichia coli NOT DETECTED NOT DETECTED Final   Klebsiella oxytoca NOT DETECTED NOT DETECTED Final   Klebsiella pneumoniae DETECTED (A) NOT DETECTED Final    Comment: CRITICAL RESULT CALLED TO, READ BACK BY AND VERIFIED WITH: SCOTT HALL AT 0559 10/28/19 SDR    Proteus species NOT DETECTED NOT DETECTED Final   Serratia marcescens NOT DETECTED NOT DETECTED Final   Carbapenem  resistance NOT DETECTED NOT DETECTED Final   Haemophilus influenzae NOT DETECTED NOT DETECTED Final   Neisseria meningitidis NOT DETECTED NOT DETECTED Final   Pseudomonas aeruginosa NOT DETECTED NOT DETECTED Final   Candida albicans NOT DETECTED NOT DETECTED Final   Candida glabrata NOT DETECTED NOT DETECTED Final   Candida krusei NOT DETECTED NOT DETECTED Final   Candida parapsilosis NOT DETECTED NOT DETECTED Final   Candida tropicalis NOT DETECTED NOT DETECTED Final    Comment: Performed at [facility], [address] [place], [place] [address]  MRSA PCR Screening     Status: None   Collection Time: 10/27/19  2:34 AM   Specimen: Nasopharyngeal  Result Value Ref Range Status   MRSA by PCR NEGATIVE NEGATIVE Final  Comment:        The GeneXpert MRSA Assay (FDA approved for NASAL specimens only), is one component of a comprehensive MRSA colonization surveillance program. It is not intended to diagnose MRSA infection nor to guide or monitor treatment for MRSA infections. Performed at [facility], [address] [place], [place] [address]      Labs:   Basic Metabolic Panel: Recent Labs  Lab 10/28/19 0654 10/28/19 3976 10/29/19 0421 10/29/19 0421 10/30/19 0510 10/31/19 1057 11/01/19 0748 11/02/19 0627 11/03/19 0506  NA 135   < > 131*   < > 130* 131* 132* 134* 136  K 2.3*   < > 2.6*   < > 3.0* 5.3* 5.2* 5.3* 4.9  CL 92*   < > 90*   < > 92* 94* 96* 101 104  CO2 25   < > 24   < > 26 24 25 22 23   GLUCOSE 144*   < > 100*   < > 100* 110* 101* 95 91  BUN 119*   < > 96*   < > 82* 69* 61* 54* 53*  CREATININE 4.18*   < > 3.84*   < > 3.01* 2.79* 2.94* 2.62* 2.59*  CALCIUM 6.9*   < > 7.4*   < > 8.3* 9.4 9.1 8.9 8.7*  MG 2.3   < > 1.8   < > 1.9 1.6* 2.0 1.8 1.8  PHOS 4.5  --  3.3  --   --   --   --   --   --    < > = values in this interval not displayed.   Liver Function Tests: Recent Labs  Lab 10/29/19 0421 10/30/19 0510  AST 21 16  ALT 18 15   ALKPHOS 120 98  BILITOT 1.2 1.0  PROT 5.8* 5.5*  ALBUMIN 1.9* 1.9*   CBC: Recent Labs  Lab 10/30/19 0510 10/31/19 1057 11/01/19 0748 11/02/19 0627 11/03/19 0506  WBC 15.6* 18.3* 22.6* 19.7* 17.1*  HGB 10.2* 11.4* 9.4* 9.1* 8.4*  HCT 27.6* 32.8* 27.3* 26.2* 24.0*  MCV 84.7 88.4 89.2 90.0 89.9  PLT 229 301 267 286 331   BNP: BNP (last 3 results) Recent Labs    10/26/19 1309  BNP 230.0*   CBG: Recent Labs  Lab 10/28/19 1955 10/29/19 0355  GLUCAP 135* 109*     IMAGING STUDIES CT ABDOMEN PELVIS WO CONTRAST  Result Date: 10/28/2019 CLINICAL DATA:  Chronic constipation due to colonic inertia, pelvic floor dysfunction, acid reflux, gastric paresis, iron deficiency anemia, malabsorption, recurrent UTI and interstitial cystitis, ethanol abuse, smoker, presents with severe hyponatremia EXAM: CT ABDOMEN AND PELVIS WITHOUT CONTRAST TECHNIQUE: Multidetector CT imaging of the abdomen and pelvis was performed following the standard protocol without IV contrast. Sagittal and coronal MPR images reconstructed from axial data set. No oral contrast was administered. Exam utilized mA and/or kV adjustment based on patient size in order to minimize patient radiation dose. COMPARISON:  None FINDINGS: Lower chest: Patchy bibasilar airspace infiltrates consistent with multifocal pneumonia Hepatobiliary: Significantly distended gallbladder 6.4 x 6.5 cm in transverse dimensions and extending it least 11 cm length no definite wall thickening. Large calcified granuloma RIGHT lobe liver posterior to gallbladder. Remainder of liver unremarkable. Pancreas: Normal appearance Spleen: Normal appearance Adrenals/Urinary Tract: Adrenal thickening without focal mass. Tiny BILATERAL renal calculi. No renal mass lesion. Minimal stranding of perinephric fat planes bilaterally. Mild RIGHT renal collecting system dilatation. Ureters unremarkable. Bladder well distended, unremarkable. Stomach/Bowel: Slightly prominent  stool in  rectum. Diffuse wall thickening of the colon consistent with colitis. Normal appendix. Small bowel loops less well evaluated, grossly unremarkable. Stomach underdistended, cannot exclude gastric wall thickening diffusely. Vascular/Lymphatic: Extensive atherosclerotic calcifications aorta and iliac arteries for age. Aorta normal caliber. No adenopathy. Reproductive: Atrophic uterus.  Nonvisualization of ovaries. Other: Minimal free fluid. No free air. Tiny umbilical hernia containing fat. Musculoskeletal: Unremarkable IMPRESSION: Patchy bibasilar airspace infiltrates consistent with multifocal pneumonia. Significantly distended question hydropic gallbladder 6.4 x 6.5 x 11 cm in transverse dimensions, recommend correlation with LFTs and potentially RIGHT upper quadrant ultrasound. Diffuse wall thickening of the colon consistent with colitis; differential diagnosis includes infection and inflammatory bowel disease, ischemia considered less likely due to length of involvement. Tiny umbilical hernia containing fat. Tiny BILATERAL nonobstructing renal calculi. Questionable gastric wall thickening versus artifact related to underdistention. Aortic Atherosclerosis (ICD10-I70.0). Electronically Signed   By: Lavonia Dana M.D.   On: 10/28/2019 14:37   DG Chest 2 View  Result Date: 10/26/2019 CLINICAL DATA:  Shortness of breath, slurred speech and dizziness. EXAM: CHEST - 2 VIEW COMPARISON:  None FINDINGS: Trachea midline. Cardiomediastinal contours and hilar structures are normal. Mild increased interstitial change in the lingula and likely in RIGHT middle lobe. No lobar level consolidative change.  No sign of pleural effusion. On limited assessment skeletal structures are unremarkable. IMPRESSION: Mild increased interstitial prominence without lobar consolidation predominantly in lingula and RIGHT middle lobe lower lobe. Correlate with any history of chronic infection. Findings could also be due to mild and or  developing pneumonitis would also correlate for any risk factors for aspiration. Electronically Signed   By: Zetta Bills M.D.   On: 10/26/2019 12:54   CT Head Wo Contrast  Result Date: 10/26/2019 CLINICAL DATA:  Mental status change and dysarthria. EXAM: CT HEAD WITHOUT CONTRAST TECHNIQUE: Contiguous axial images were obtained from the base of the skull through the vertex without intravenous contrast. COMPARISON:  None. FINDINGS: Brain: No evidence of acute infarction, hemorrhage, hydrocephalus, extra-axial collection or mass lesion/mass effect. Vascular: No hyperdense vessel or unexpected calcification. Skull: Normal. Negative for fracture or focal lesion. Sinuses/Orbits: No acute finding. Other: None. IMPRESSION: No focal acute intracranial abnormality identified. Electronically Signed   By: Abelardo Diesel M.D.   On: 10/26/2019 13:06   US RENAL  Result Date: 10/26/2019 CLINICAL DATA:  Acute renal failure EXAM: RENAL / URINARY TRACT ULTRASOUND COMPLETE COMPARISON:  None. FINDINGS: Right Kidney: Renal measurements: 12.0 x 5.3 x 6.9 cm = volume: 227 mL . Echogenicity is increased. No mass or hydronephrosis visualized. Left Kidney: Renal measurements: 10.5 x 5.6 x 4.7 cm = volume: 147 mL. Echogenicity is increased. No mass or hydronephrosis visualized. Bladder: Debris is seen within the bladder. Other: None. IMPRESSION: Increased renal cortical echogenicity may reflect chronic renal disease. No hydronephrosis. Electronically Signed   By: Zerita Boers M.D.   On: 10/26/2019 17:23   US Abdomen Limited RUQ  Result Date: 10/29/2019 CLINICAL DATA:  Abdominal pain. EXAM: ULTRASOUND ABDOMEN LIMITED RIGHT UPPER QUADRANT COMPARISON:  CT a dated October 24, 2019 FINDINGS: Gallbladder: The sonographic Percell Miller sign is negative. There is gallbladder sludge without evidence for gallstones. The gallbladder is distended measuring approximately 10.9 x 5.4 x 5.8 cm. There is no gallbladder wall thickening or pericholecystic free  fluid. Common bile duct: Diameter: 3 mm Liver: No focal lesion identified. Within normal limits in parenchymal echogenicity. Portal vein is patent on color Doppler imaging with normal direction of blood flow towards the liver. Other: None. IMPRESSION: Distended  gallbladder with gallbladder sludge without evidence for acute cholecystitis. Electronically Signed   By: Constance Holster M.D.   On: 10/29/2019 18:53    DISCHARGE EXAMINATION: Vitals:   11/02/19 2005 11/03/19 0453 11/03/19 0734 11/03/19 1153  BP: 132/64 112/64 126/68 122/71  Pulse: 94 98 89 91  Resp: 20 18 20 20   Temp: 97.7 F (36.5 C) 98.2 F (36.8 C) 98.1 F (36.7 C) 97.7 F (36.5 C)  TempSrc: Oral Oral Oral Oral  SpO2: 100% 100% 100% 100%  Weight:      Height:       General appearance: Awake alert.  In no distress Resp: Clear to auscultation bilaterally.  Normal effort Cardio: S1-S2 is normal regular.  No S3-S4.  No rubs murmurs or bruit GI: Abdomen is soft.  Nontender nondistended.  Bowel sounds are present normal.  No masses organomegaly   DISPOSITION: Home with husband  Discharge Instructions    [facility] pharmacist to adjust dose for Vancomycin, Aminoglycosides and other anti-infective therapies as requested by physician.   Complete by: As directed    [facility] to provide Cath Flo 44m   Complete by: As directed    Administer for PICC line occlusion and as ordered by physician for other access device issues.   Anaphylaxis Kit: Provided to treat any anaphylactic reaction to the medication being provided to the patient if First Dose or when requested by physician   Complete by: As directed    Epinephrine 176mml vial / amp: Administer 0.59m76m0.59ml359mubcutaneously once for moderate to severe anaphylaxis, nurse to call physician and pharmacy when reaction occurs and call 911 if needed for immediate care   Diphenhydramine 50mg48mIV vial: Administer 25-50mg 35mM PRN for first dose reaction,  rash, itching, mild reaction, nurse to call physician and pharmacy when reaction occurs   Sodium Chloride 0.9% NS 500ml I30mdminister if needed for hypovolemic blood pressure drop or as ordered by physician after call to physician with anaphylactic reaction   Call MD for:  difficulty breathing, headache or visual disturbances   Complete by: As directed    Call MD for:  extreme fatigue   Complete by: As directed    Call MD for:  persistant dizziness or light-headedness   Complete by: As directed    Call MD for:  persistant nausea and vomiting   Complete by: As directed    Call MD for:  severe uncontrolled pain   Complete by: As directed    Call MD for:  temperature >100.4   Complete by: As directed    Change dressing on IV access line weekly and PRN   Complete by: As directed    Discharge instructions   Complete by: As directed    The nephrologist will arrange a follow-up and blood work in their office.  You will need to have blood work to check your kidney function and your hemoglobin.  Preferably this needs to be done within the next 1 week.   Take your medications as prescribed.  Follow-up also with the surgeon to discuss your gallbladder.  Seek attention if you develop high fever or if you develop abdominal pain with nausea or vomiting.  You were cared for by a hospitalist during your hospital stay. If you have any questions about your discharge medications or the care you received while you were in the hospital after you are discharged, you can call the unit and asked to speak with the hospitalist on call if the hospitalist  that took care of you is not available. Once you are discharged, your primary care physician will handle any further medical issues. Please note that NO REFILLS for any discharge medications will be authorized once you are discharged, as it is imperative that you return to your primary care physician (or establish a relationship with a primary care physician if you do  not have one) for your aftercare needs so that they can reassess your need for medications and monitor your lab values. If you do not have a primary care physician, you can call [phone] for a physician referral.   Flush IV access with Sodium Chloride 0.9% and Heparin 10 units/ml or 100 units/ml   Complete by: As directed    Home infusion instructions - [facility]   Complete by: As directed    Instructions: Flush IV access with Sodium Chloride 0.9% and Heparin 10units/ml or 100units/ml   Change dressing on IV access line: Weekly and PRN   Instructions Cath Flo 48m: Administer for PICC Line occlusion and as ordered by physician for other access device   [facility] pharmacist to adjust dose for: Vancomycin, Aminoglycosides and other anti-infective therapies as requested by physician   Increase activity slowly   Complete by: As directed    Method of administration may be changed at the discretion of home infusion pharmacist based upon assessment of the patient and/or caregivers ability to self-administer the medication ordered   Complete by: As directed    No wound care   Complete by: As directed         Allergies as of 11/03/2019      Reactions   Cetirizine Hives   Diazepam Other (See Comments)   Depression   Baclofen Anxiety, Other (See Comments)   AMS - "Really messed me up, caused me to drop things"      Medication List    STOP taking these medications   Diclofenac-miSOPROStol 75-0.2 MG Tbec   trimethoprim 100 MG tablet Commonly known as: TRIMPEX     TAKE these medications   albuterol 108 (90 Base) MCG/ACT inhaler Commonly known as: VENTOLIN HFA Inhale 2 puffs into the lungs every 4 (four) hours as needed.   ertapenem  IVPB Commonly known as: INVANZ Inject 500 mg into the vein daily for 4 days. Indication:  ESBL Klebsiella bacteremia First Dose: no Last Day of Therapy:  11/07/19  Labs on Thursday  while on IV antibiotics: _X_ CBC with  differential _X_ CMP _X_ Please pull PICC at completion of IV antibiotics  Fax weekly labs to ([phone] Clinic Follow Up Appt:in 1 week Call [phone] make appt   Method of administration: Mini-Bag Plus / Gravity Method of administration may be changed at the discretion of home infusion pharmacist based upon assessment of the patient and/or caregiver's ability to self-administer the medication ordered. Start taking on: November 04, 2019   famotidine 40 MG tablet Commonly known as: PEPCID Take 40 mg by mouth daily.   fexofenadine 180 MG tablet Commonly known as: ALLEGRA Take 180 mg by mouth daily as needed for allergies.   Fiber 625 MG Tabs Take 2 capsules by mouth 2 (two) times daily.   Fiber Laxative 0.52 g capsule Generic drug: psyllium Take 2 capsules by mouth 2 (two) times daily.   fluticasone 50 MCG/ACT nasal spray Commonly known as: FLONASE Place 2 sprays into both nostrils 2 (two) times daily.   Fluticasone-Salmeterol 100-50 MCG/DOSE Aepb Commonly known as: ADVAIR Inhale 1  puff into the lungs 2 (two) times daily.   Fluticasone-Salmeterol 250-50 MCG/DOSE Aepb Commonly known as: ADVAIR Inhale 1 puff into the lungs 2 (two) times daily.   gabapentin 300 MG capsule Commonly known as: NEURONTIN Take 300 mg by mouth 5 (five) times daily as needed.   metoCLOPramide 10 MG tablet Commonly known as: REGLAN Take 20 mg by mouth in the morning and at bedtime.   ondansetron 8 MG tablet Commonly known as: ZOFRAN Take 16 mg by mouth 2 (two) times daily.   promethazine 25 MG tablet Commonly known as: PHENERGAN Take 25 mg by mouth every 8 (eight) hours as needed for nausea or vomiting.   RABEprazole 20 MG tablet Commonly known as: ACIPHEX Take 20 mg by mouth in the morning and at bedtime.   tamsulosin 0.4 MG Caps capsule Commonly known as: FLOMAX Take 0.4 mg by mouth daily.   theophylline 400 MG 24 hr tablet Commonly known as: UNIPHYL Take 400 mg by  mouth 2 (two) times daily.   thiamine 100 MG tablet Take 1 tablet (100 mg total) by mouth daily.   traMADol 50 MG tablet Commonly known as: ULTRAM Take 50 mg by mouth 3 (three) times daily as needed.   Trulance 3 MG Tabs Generic drug: Plecanatide Take 3 mg by mouth daily at 6 (six) AM.            Discharge Care Instructions  (From admission, onward)         Start     Ordered   11/03/19 0000  Change dressing on IV access line weekly and PRN  (Home infusion instructions - [facility] )        11/03/19 1002            Follow-up Information    Murlean Iba, MD. Go on 11/13/2019.   Specialty: Nephrology Why: 11am appointment in the [facility] office Contact information: [place] [address] [phone]        Herbert Pun, MD. Go on 11/12/2019.   Specialty: General Surgery Why: you have a 9am appointment to further discuss the abnormal gallbladder found on ultrasound Contact information: [place] [address] [phone]               TOTAL DISCHARGE TIME: 35 minutes  [facility] Pager on [email or web address]  11/03/2019, 2:02 PM

## 2019-11-03 NOTE — TOC Initial Note (Signed)
Transition of Care [facility]) - Initial/Assessment Note    Patient Details  Name: Katherine Perez MRN: [id number] Date of Birth: 31-Oct-1970  Transition of Care [facility]) CM/SW Contact:    Beverly Sessions, RN Phone Number: 11/03/2019, 3:04 PM  Clinical Narrative:                 Patient admitted from home with hyponatremia.  Patient lives at home with husband  PCP [place] -  Denies issues obtaining medications  Husband provides transportation  Patient has Gilford Rile, cane, shower seat in the home.    PT has assessed patient and recommend  Home health PT.  RNCM has reached out to all local home health agencies and none are able to accept at this time.  Patient is already established with outpatient PT at [place].  Patient and husband are in agreement to continue  Patient will require home IV antibiotics.  [facility] with [facility] has met patient and husband at bedside to provide education.  [place] to provide start of care tomorrow at 1 pm.  Everyone is in agreement with the plan   Expected Discharge Plan: [place] Barriers to Discharge: No Barriers Identified   Patient Goals and CMS Choice        Expected Discharge Plan and Services Expected Discharge Plan: [place]   Discharge Planning Services: CM Consult   Living arrangements for the past 2 months: Single Family Home Expected Discharge Date: 11/03/19                                    Prior Living Arrangements/Services Living arrangements for the past 2 months: Single Family Home Lives with:: Spouse              Current home services: DME    Activities of Daily Living Home Assistive Devices/Equipment: Cane (specify quad or straight), Walker (specify type) (tri-cane, wheeled walker) ADL Screening (condition at time of admission) Patient's cognitive ability adequate to safely complete daily activities?: Yes Is the patient deaf or have  difficulty hearing?: No Does the patient have difficulty seeing, even when wearing glasses/contacts?: No Does the patient have difficulty concentrating, remembering, or making decisions?: No Patient able to express need for assistance with ADLs?: No Does the patient have difficulty dressing or bathing?: No Independently performs ADLs?: Yes (appropriate for developmental age) Does the patient have difficulty walking or climbing stairs?: Yes Weakness of Legs: Both Weakness of Arms/Hands: Both ("general weakness per patient")  Permission Sought/Granted                  Emotional Assessment              Admission diagnosis:  Dehydration [E86.0] Hyponatremia [E87.1] ARF (acute renal failure) ([place]) [N17.9] AKI (acute kidney injury) ([place]) [N17.9] Urinary tract infection with hematuria, site unspecified [N39.0, R31.9] Patient Active Problem List   Diagnosis Date Noted   Pressure injury of skin 10/27/2019   Hyponatremia 10/26/2019   High anion gap metabolic acidosis 23/53/6144   Acute kidney injury superimposed on CKD ([place]) 10/26/2019   Dehydration 31/54/0086   Acute metabolic encephalopathy 76/19/5093   Leukocytosis 10/26/2019   PCP:  Patient, No Pcp Per Pharmacy:   [place], [address] [place] [address] Phone: [phone] Fax: [phone]     Social  Determinants of Health (SDOH) Interventions    Readmission Risk Interventions No flowsheet data found.

## 2019-11-03 NOTE — Discharge Instructions (Signed)

## 2019-11-12 ENCOUNTER — Telehealth: Payer: Self-pay | Admitting: Infectious Diseases

## 2019-11-12 NOTE — Telephone Encounter (Signed)
Spoke to patient and her husband and shared the results- WBC normalized from 17 to 9 Hb stable at 8.5, cr 2.5 Platelet is up and is 656. Asked them to check it in 2 weeks. They have an appt with [facility] nephrologist tomorrow

## 2019-11-12 NOTE — Telephone Encounter (Signed)
Sent recent labs to doctor for review.

## 2019-11-12 NOTE — Telephone Encounter (Signed)
Pt's husband called and stated pt was a patient of Dr. Delaine Lame and had lab work done. He stated they have not heard of any results and did not see anything MyChart. Would like clarification of who would have the results. Able to reach him at [phone]

## 2020-04-22 ENCOUNTER — Emergency Department: Payer: BC Managed Care – PPO

## 2020-04-22 ENCOUNTER — Inpatient Hospital Stay
Admission: EM | Admit: 2020-04-22 | Discharge: 2020-04-26 | DRG: 438 | Disposition: A | Discharge status: Home / Self Care | Payer: BC Managed Care – PPO | Attending: Internal Medicine | Admitting: Internal Medicine

## 2020-04-22 DIAGNOSIS — G9341 Metabolic encephalopathy: Secondary | ICD-10-CM | POA: Diagnosis present

## 2020-04-22 DIAGNOSIS — D72829 Elevated white blood cell count, unspecified: Secondary | ICD-10-CM | POA: Diagnosis present

## 2020-04-22 DIAGNOSIS — J189 Pneumonia, unspecified organism: Secondary | ICD-10-CM

## 2020-04-22 DIAGNOSIS — N19 Unspecified kidney failure: Secondary | ICD-10-CM

## 2020-04-22 DIAGNOSIS — N179 Acute kidney failure, unspecified: Secondary | ICD-10-CM | POA: Diagnosis present

## 2020-04-22 DIAGNOSIS — G9349 Other encephalopathy: Secondary | ICD-10-CM

## 2020-04-22 DIAGNOSIS — J45909 Unspecified asthma, uncomplicated: Secondary | ICD-10-CM | POA: Diagnosis present

## 2020-04-22 DIAGNOSIS — Z79899 Other long term (current) drug therapy: Secondary | ICD-10-CM

## 2020-04-22 DIAGNOSIS — F17200 Nicotine dependence, unspecified, uncomplicated: Secondary | ICD-10-CM | POA: Diagnosis present

## 2020-04-22 DIAGNOSIS — R9431 Abnormal electrocardiogram [ECG] [EKG]: Secondary | ICD-10-CM | POA: Diagnosis present

## 2020-04-22 DIAGNOSIS — E8729 Other acidosis: Secondary | ICD-10-CM

## 2020-04-22 DIAGNOSIS — Z20822 Contact with and (suspected) exposure to covid-19: Secondary | ICD-10-CM | POA: Diagnosis present

## 2020-04-22 DIAGNOSIS — E861 Hypovolemia: Secondary | ICD-10-CM | POA: Diagnosis not present

## 2020-04-22 DIAGNOSIS — E876 Hypokalemia: Secondary | ICD-10-CM | POA: Diagnosis present

## 2020-04-22 DIAGNOSIS — K859 Acute pancreatitis without necrosis or infection, unspecified: Principal | ICD-10-CM | POA: Diagnosis present

## 2020-04-22 DIAGNOSIS — I7300 Raynaud's syndrome without gangrene: Secondary | ICD-10-CM | POA: Diagnosis present

## 2020-04-22 DIAGNOSIS — I248 Other forms of acute ischemic heart disease: Secondary | ICD-10-CM | POA: Diagnosis present

## 2020-04-22 DIAGNOSIS — G629 Polyneuropathy, unspecified: Secondary | ICD-10-CM | POA: Diagnosis present

## 2020-04-22 DIAGNOSIS — Z7951 Long term (current) use of inhaled steroids: Secondary | ICD-10-CM

## 2020-04-22 DIAGNOSIS — K219 Gastro-esophageal reflux disease without esophagitis: Secondary | ICD-10-CM | POA: Diagnosis present

## 2020-04-22 DIAGNOSIS — E871 Hypo-osmolality and hyponatremia: Secondary | ICD-10-CM | POA: Diagnosis present

## 2020-04-22 DIAGNOSIS — N3010 Interstitial cystitis (chronic) without hematuria: Secondary | ICD-10-CM | POA: Diagnosis present

## 2020-04-22 DIAGNOSIS — R4182 Altered mental status, unspecified: Secondary | ICD-10-CM | POA: Diagnosis not present

## 2020-04-22 DIAGNOSIS — Z888 Allergy status to other drugs, medicaments and biological substances status: Secondary | ICD-10-CM

## 2020-04-22 DIAGNOSIS — E872 Acidosis: Secondary | ICD-10-CM | POA: Diagnosis present

## 2020-04-22 DIAGNOSIS — N184 Chronic kidney disease, stage 4 (severe): Secondary | ICD-10-CM

## 2020-04-22 LAB — CBC
HCT: 41.7 % (ref 36.0–46.0)
Hemoglobin: 14.2 g/dL (ref 12.0–15.0)
MCH: 32.1 pg (ref 26.0–34.0)
MCHC: 34.1 g/dL (ref 30.0–36.0)
MCV: 94.3 fL (ref 80.0–100.0)
Platelets: 438 10*3/uL — ABNORMAL HIGH (ref 150–400)
RBC: 4.42 MIL/uL (ref 3.87–5.11)
RDW: 14.6 % (ref 11.5–15.5)
WBC: 15.4 10*3/uL — ABNORMAL HIGH (ref 4.0–10.5)
nRBC: 0 % (ref 0.0–0.2)

## 2020-04-22 LAB — ETHANOL: Alcohol, Ethyl (B): <10 mg/dL (ref <10)

## 2020-04-22 LAB — BASIC METABOLIC PANEL
Anion gap: 18 — ABNORMAL HIGH (ref 5–15)
BUN: 70 mg/dL — ABNORMAL HIGH (ref 6–20)
CO2: 9 mmol/L — ABNORMAL LOW (ref 22–32)
Calcium: 12 mg/dL — ABNORMAL HIGH (ref 8.9–10.3)
Chloride: 99 mmol/L (ref 98–111)
Creatinine, Ser: 4.08 mg/dL — ABNORMAL HIGH (ref 0.44–1.00)
GFR, Estimated: 13 mL/min — ABNORMAL LOW (ref >60)
Glucose, Bld: 91 mg/dL (ref 70–99)
Potassium: 2.5 mmol/L — CL (ref 3.5–5.1)
Sodium: 126 mmol/L — ABNORMAL LOW (ref 135–145)

## 2020-04-22 LAB — TROPONIN I (HIGH SENSITIVITY): Troponin I (High Sensitivity): 30 ng/L — ABNORMAL HIGH (ref <18)

## 2020-04-22 LAB — LIPASE, BLOOD: Lipase: 1127 U/L — ABNORMAL HIGH (ref 11–51)

## 2020-04-22 LAB — AMMONIA: Ammonia: 12 umol/L (ref 9–35)

## 2020-04-22 LAB — LACTIC ACID, PLASMA: Lactic Acid, Venous: 1.4 mmol/L (ref 0.5–1.9)

## 2020-04-22 MED ORDER — POTASSIUM CHLORIDE 10 MEQ/100ML IV SOLN
10.0000 meq | Freq: Once | INTRAVENOUS | Status: AC
  Administered 2020-04-23: 10 meq via INTRAVENOUS
  Filled 2020-04-22: qty 100

## 2020-04-22 MED ORDER — POTASSIUM CHLORIDE 10 MEQ/100ML IV SOLN
10.0000 meq | Freq: Once | INTRAVENOUS | Status: AC
  Administered 2020-04-23: 10 meq via INTRAVENOUS

## 2020-04-22 MED ORDER — SODIUM CHLORIDE 0.9 % IV BOLUS
1000.0000 mL | Freq: Once | INTRAVENOUS | Status: AC
  Administered 2020-04-23: 1000 mL via INTRAVENOUS

## 2020-04-22 NOTE — ED Provider Notes (Incomplete)
[facility] Emergency Department Provider Note   ____________________________________________   Event Date/Time   First MD Initiated Contact with Patient 04/22/20 2323     (approximate)  I have reviewed the triage vital signs and the nursing notes.   HISTORY  Chief Complaint Altered Mental Status and Weakness    HPI Katherine Perez is a 49 y.o. 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      {**SYMPTOM/COMPLAINT  LOCATION (describe anatomically) DURATION (when did it start) TIMING (onset and pattern) SEVERITY (0-10, mild/moderate/severe) QUALITY (description of symptoms) CONTEXT (recent surgery, new meds, activity, etc.) MODIFYINGFACTORS (what makes it better/worse) ASSOCIATEDSYMPTOMS (pertinent positives and negatives)**} Past Medical History:  Diagnosis Date  . Anemia   . CKD (chronic kidney disease)   . ETOH abuse   . GERD (gastroesophageal reflux disease)   . IC (interstitial cystitis)   . Laxative abuse   . Neuropathy   . Raynaud disease     Patient Active Problem List   Diagnosis Date Noted  . Pressure injury of skin 10/27/2019  . Hyponatremia 10/26/2019  . High anion gap metabolic acidosis 70/26/3785  . Acute kidney injury superimposed on CKD ([place]) 10/26/2019  . Dehydration 10/26/2019  . Acute metabolic encephalopathy 88/50/2774  . Leukocytosis 10/26/2019    Past Surgical History:  Procedure Laterality Date  . LEG SURGERY      Prior to Admission medications   Medication Sig Start Date End Date Taking? Authorizing Provider  albuterol (VENTOLIN HFA) 108 (90 Base) MCG/ACT inhaler Inhale 2 puffs into the lungs  every 4 (four) hours as needed. 06/07/19   [provider]  Calcium Polycarbophil (FIBER) 625 MG TABS Take 2 capsules by mouth 2 (two) times daily.    [provider]  famotidine (PEPCID) 40 MG tablet Take 40 mg by mouth daily. 10/07/19   [provider]  fexofenadine (ALLEGRA) 180 MG tablet Take 180 mg by mouth daily as needed for allergies.    [provider]  fluticasone (FLONASE) 50 MCG/ACT nasal spray Place 2 sprays into both nostrils 2 (two) times daily.  09/14/19   [provider]  Fluticasone-Salmeterol (ADVAIR) 100-50 MCG/DOSE AEPB Inhale 1 puff into the lungs 2 (two) times daily. 09/14/19   [provider]  Fluticasone-Salmeterol (ADVAIR) 250-50 MCG/DOSE AEPB Inhale 1 puff into the lungs 2 (two) times daily. 09/14/19   [provider]  gabapentin (NEURONTIN) 300 MG capsule Take 300 mg by mouth 5 (five) times daily as needed. 10/07/19   [provider]  metoCLOPramide (REGLAN) 10 MG tablet Take 20 mg by mouth in the morning and at bedtime.  10/17/19   [provider]  ondansetron (ZOFRAN) 8 MG tablet Take 16 mg by mouth 2 (two) times daily.  10/23/19   [provider]  promethazine (PHENERGAN) 25 MG tablet Take 25 mg by mouth every 8 (eight) hours as needed for nausea or vomiting.  10/23/19   [provider]  psyllium (FIBER LAXATIVE) 0.52 g capsule Take 2 capsules by mouth 2 (two) times daily. Patient not taking: Reported on 10/26/2019    [provider]  RABEprazole (ACIPHEX) 20 MG tablet Take 20 mg by mouth in the morning and at bedtime.  10/12/19   [provider]  tamsulosin (FLOMAX) 0.4 MG CAPS capsule Take 0.4 mg by mouth daily. 10/10/19   [provider]  theophylline (UNIPHYL) 400 MG 24 hr tablet Take 400 mg by mouth 2 (two) times daily.  10/10/19   [provider]  thiamine 100 MG tablet Take 1 tablet (100 mg total) by mouth daily. 11/03/19   Bonnielee Haff, MD   traMADol (ULTRAM) 50 MG tablet Take 50 mg by mouth 3 (three) times daily as needed. 10/03/19   [provider]  TRULANCE 3 MG TABS Take 3 mg by mouth daily at 6 (six) AM.  10/12/19   [provider]    Allergies Cetirizine, Diazepam, and Baclofen  No family history on file.  Social History Social History   Tobacco Use  . Smoking status: Current Every Day Smoker  . Smokeless tobacco: Never Used  Substance Use Topics  . Alcohol use: Yes    Comment: 4-5 drink per week  . Drug use: Not Currently    Review of Systems {** Revise as appropriate then delete this line - Documentation of 10 systems is required  **} Constitutional: No fever/chills Eyes: No visual changes. ENT: No sore throat. Cardiovascular: Denies chest pain. Respiratory: Denies shortness of breath. Gastrointestinal: No abdominal pain.  No nausea, no vomiting.  No diarrhea.  No constipation. Genitourinary: Negative for dysuria. Musculoskeletal: Negative for back pain. Skin: Negative for rash. Neurological: Negative for headaches, focal weakness or numbness. {**Psychiatric:  Endocrine:  Hematological/Lymphatic:  Allergic/Immunilogical: **}  ____________________________________________   PHYSICAL EXAM:  VITAL SIGNS: ED Triage Vitals [04/22/20 2131]  Enc Vitals Group     BP 107/76     Pulse Rate 66     Resp 15     Temp 99 F (37.2 C)     Temp Source Oral     SpO2 94 %     Weight      Height      Head Circumference      Peak Flow      Pain Score      Pain Loc      Pain Edu?      Excl. in GC?    {** Revise as appropriate then delete this line - 8 systems required **} Constitutional: Alert and oriented. Well appearing and in no acute distress. Eyes: Conjunctivae are normal. PERRL. EOMI. Head: Atraumatic. Nose: No congestion/rhinnorhea. Mouth/Throat: Mucous membranes are moist.  Oropharynx non-erythematous. Neck: No stridor.  {**No cervical spine tenderness to  palpation.**} {**Hematological/Lymphatic/Immunilogical: No cervical lymphadenopathy. **}Cardiovascular: Normal rate, regular rhythm. Grossly normal heart sounds.  Good peripheral circulation. Respiratory: Normal respiratory effort.  No retractions. Lungs CTAB. Gastrointestinal: Soft and nontender. No distention. No abdominal bruits. No CVA tenderness. {**Genitourinary:  **}Musculoskeletal: No lower extremity tenderness nor edema.  No joint effusions. Neurologic:  Normal speech and language. No gross focal neurologic deficits are appreciated. No gait instability. Skin:  Skin is warm, dry and intact. No rash noted. Psychiatric: Mood and affect are normal. Speech and behavior are normal.  ____________________________________________   LABS (all labs ordered are listed, but only abnormal results are displayed)  Labs Reviewed  BASIC METABOLIC PANEL - Abnormal; Notable for the following components:      Result Value   Sodium 126 (*)    Potassium 2.5 (*)    CO2 9 (*)    BUN 70 (*)    Creatinine, Ser 4.08 (*)    Calcium 12.0 (*)    GFR, Estimated 13 (*)    Anion gap  18 (*)    All other components within normal limits  CBC - Abnormal; Notable for the following components:   WBC 15.4 (*)    Platelets 438 (*)    All other components within normal limits  LIPASE, BLOOD - Abnormal; Notable for the following components:   Lipase 1,127 (*)    All other components within normal limits  TROPONIN I (HIGH SENSITIVITY) - Abnormal; Notable for the following components:   Troponin I (High Sensitivity) 30 (*)    All other components within normal limits  RESP PANEL BY RT-PCR (FLU A&B, COVID) ARPGX2  LACTIC ACID, PLASMA  AMMONIA  ETHANOL  URINALYSIS, COMPLETE (UACMP) WITH MICROSCOPIC  LACTIC ACID, PLASMA  URINE DRUG SCREEN, QUALITATIVE ([facility] ONLY)  HEPATIC FUNCTION PANEL  CBG MONITORING, ED  TROPONIN I (HIGH SENSITIVITY)    ____________________________________________  EKG  *** ____________________________________________  RADIOLOGY I, Jian Hodgman J, personally viewed and evaluated these images (plain radiographs) as part of my medical decision making, as well as reviewing the written report by the radiologist.  ED MD interpretation:  ***  Official radiology report(s): No results found.  ____________________________________________   PROCEDURES  Procedure(s) performed (including Critical Care):  Procedures   ____________________________________________   INITIAL IMPRESSION / ASSESSMENT AND PLAN / ED COURSE  As part of my medical decision making, I reviewed the following data within the electronic MEDICAL RECORD NUMBER {[id number]::"Notes from prior ED visits","[place] Controlled Substance Database"}        ***      ____________________________________________   FINAL CLINICAL IMPRESSION(S) / ED DIAGNOSES  Final diagnoses:  Altered mental status, unspecified altered mental status type  AKI (acute kidney injury) ([place])  [place] encephalopathy  Hypokalemia  Acute pancreatitis, unspecified complication status, unspecified pancreatitis type     ED Discharge Orders    None      *Please note:  Katherine Perez was evaluated in Emergency Department on 04/22/2020 for the symptoms described in the history of present illness. She was evaluated in the context of the global COVID-19 pandemic, which necessitated consideration that the patient might be at risk for infection with the SARS-CoV-2 virus that causes COVID-19. Institutional protocols and algorithms that pertain to the evaluation of patients at risk for COVID-19 are in a state of rapid change based on information released by regulatory bodies including the CDC and federal and state organizations. These policies and algorithms were followed during the patient's care in the ED.  Some ED evaluations and interventions may be delayed as a result of  limited staffing during and the pandemic.*   Note:  This document was prepared using Dragon voice recognition software and may include unintentional dictation errors.

## 2020-04-22 NOTE — ED Triage Notes (Addendum)
Pt brought in by spouse who reports pt has had AMS x3 days, worsening today. Pt with jaundice color to skin but not eyes. Pt with hospitalization in July due to electrolyte imbalance. Spouse with pt to help with information due to AMS.   Pt with hx/o ETOH and recurrent sepsis.

## 2020-04-22 NOTE — ED Provider Notes (Signed)
[facility] Emergency Department Provider Note   ____________________________________________   Event Date/Time   First MD Initiated Contact with Patient 04/22/20 2323     (approximate)  I have reviewed the triage vital signs and the nursing notes.   HISTORY  Chief Complaint Altered Mental Status and Weakness    HPI Katherine Perez is a 49 y.o. female brought to the ED from home by her spouse with a chief complaint of altered mentation, generalized weakness and malaise.  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  Patient does note some epigastric discomfort without nausea or vomiting.  Also some diarrhea today.  Spouse notes jaundice to her skin.  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  Denies fever, cough, chest pain, shortness of breath.  Patient is vaccinated against COVID-19  Past Medical History:  Diagnosis Date  . Anemia   . CKD (chronic kidney disease)   . ETOH abuse   . GERD (gastroesophageal reflux disease)   . IC (interstitial cystitis)   . Laxative abuse   . Neuropathy   . Raynaud disease     Patient Active Problem List   Diagnosis Date Noted  . Acute pancreatitis without infection or necrosis 04/23/2020  . GERD without esophagitis 04/23/2020  . Hypokalemia, inadequate intake 04/23/2020  . Pressure injury of skin 10/27/2019  . Hyponatremia 10/26/2019  . Increased anion gap metabolic acidosis 63/78/5885  . Acute renal failure superimposed on stage 4 chronic kidney disease ([place]) 10/26/2019  . Acute metabolic encephalopathy 02/77/4128  . Leukocytosis 10/26/2019    Past Surgical History:  Procedure Laterality Date  . LEG SURGERY       Prior to Admission medications   Medication Sig Start Date End Date Taking? Authorizing Provider  albuterol (VENTOLIN HFA) 108 (90 Base) MCG/ACT inhaler Inhale 2 puffs into the lungs every 4 (four) hours as needed. 06/07/19   [provider]  Calcium Polycarbophil (FIBER) 625 MG TABS Take 2 capsules by mouth 2 (two) times daily.    [provider]  famotidine (PEPCID) 40 MG tablet Take 40 mg by mouth daily. 10/07/19   [provider]  fexofenadine (ALLEGRA) 180 MG tablet Take 180 mg by mouth daily as needed for allergies.    [provider]  fluticasone (FLONASE) 50 MCG/ACT nasal spray Place 2 sprays into both nostrils 2 (two) times daily.  09/14/19   [provider]  Fluticasone-Salmeterol (ADVAIR) 100-50 MCG/DOSE AEPB Inhale 1 puff into the lungs 2 (two) times daily. 09/14/19   [provider]  Fluticasone-Salmeterol (ADVAIR) 250-50 MCG/DOSE AEPB Inhale 1 puff into the lungs 2 (two) times daily. 09/14/19   [provider]  gabapentin (NEURONTIN) 300 MG capsule Take 300 mg by mouth 5 (five) times daily as needed. 10/07/19   [provider]  metoCLOPramide (REGLAN) 10 MG tablet Take 20 mg by mouth in the morning and at bedtime.  10/17/19   [provider]  ondansetron (ZOFRAN) 8 MG tablet Take 16 mg by mouth 2 (two) times daily.  10/23/19   [provider]  promethazine (PHENERGAN) 25 MG tablet Take 25 mg by mouth every 8 (eight) hours as needed for nausea or vomiting.  10/23/19   [provider]  psyllium (FIBER LAXATIVE) 0.52 g capsule Take  2 capsules by mouth 2 (two) times daily. Patient not taking: Reported on 10/26/2019    [provider]  RABEprazole (ACIPHEX) 20 MG tablet Take 20 mg by mouth in the morning and at bedtime.  10/12/19   [provider]  tamsulosin (FLOMAX) 0.4 MG CAPS capsule Take 0.4 mg by mouth daily. 10/10/19   [provider]  theophylline (UNIPHYL) 400 MG 24  hr tablet Take 400 mg by mouth 2 (two) times daily.  10/10/19   [provider]  thiamine 100 MG tablet Take 1 tablet (100 mg total) by mouth daily. 11/03/19   Bonnielee Haff, MD  traMADol (ULTRAM) 50 MG tablet Take 50 mg by mouth 3 (three) times daily as needed. 10/03/19   [provider]  TRULANCE 3 MG TABS Take 3 mg by mouth daily at 6 (six) AM.  10/12/19   [provider]    Allergies Cetirizine, Diazepam, and Baclofen  Family History  Problem Relation Age of Onset  . Other Neg Hx     Social History Social History   Tobacco Use  . Smoking status: Current Every Day Smoker  . Smokeless tobacco: Never Used  Substance Use Topics  . Alcohol use: Yes    Comment: 4-5 drink per week  . Drug use: Not Currently    Review of Systems  Constitutional: Positive for generalized weakness and malaise.  No fever/chills Eyes: No visual changes. ENT: No sore throat. Cardiovascular: Denies chest pain. Respiratory: Denies shortness of breath. Gastrointestinal: Positive for abdominal pain.  No nausea, no vomiting.  Positive for diarrhea.  No constipation. Genitourinary: Negative for dysuria. Musculoskeletal: Negative for back pain. Skin: Negative for rash. Neurological: Positive for altered mentation.  Negative for headaches, focal weakness or numbness.   ____________________________________________   PHYSICAL EXAM:  VITAL SIGNS: ED Triage Vitals [04/22/20 2131]  Enc Vitals Group     BP 107/76     Pulse Rate 66     Resp 15     Temp 99 F (37.2 C)     Temp Source Oral     SpO2 94 %     Weight      Height      Head Circumference      Peak Flow      Pain Score      Pain Loc      Pain Edu?      Excl. in [place]?     Constitutional: Alert and oriented.  Cachectic appearing and in no acute distress. Eyes: Conjunctivae are normal. PERRL. EOMI. Head: Atraumatic. Nose: No congestion/rhinnorhea. Mouth/Throat: Mucous membranes are mildly dry.   Neck: No  stridor.   Cardiovascular: Normal rate, regular rhythm. Grossly normal heart sounds.  Good peripheral circulation. Respiratory: Normal respiratory effort.  No retractions. Lungs CTAB. Gastrointestinal: Soft and mildly tender to palpation epigastrium without rebound or guarding. No distention. No abdominal bruits. No CVA tenderness. Musculoskeletal: No lower extremity tenderness nor edema.  No joint effusions. Neurologic: Alert and oriented to person and place.  Intermittently confused.  Normal speech and language. No gross focal neurologic deficits are appreciated.  Skin:  Skin is jaundiced, warm, dry and intact. No rash noted.  No petechiae. Psychiatric: Mood and affect are normal. Speech and behavior are normal.  ____________________________________________   LABS (all labs ordered are listed, but only abnormal results are displayed)  Labs Reviewed  BASIC METABOLIC PANEL - Abnormal; Notable for the following components:      Result Value   Sodium  126 (*)    Potassium 2.5 (*)    CO2 9 (*)    BUN 70 (*)    Creatinine, Ser 4.08 (*)    Calcium 12.0 (*)    GFR, Estimated 13 (*)    Anion gap 18 (*)    All other components within normal limits  CBC - Abnormal; Notable for the following components:   WBC 15.4 (*)    Platelets 438 (*)    All other components within normal limits  LIPASE, BLOOD - Abnormal; Notable for the following components:   Lipase 1,127 (*)    All other components within normal limits  HEPATIC FUNCTION PANEL - Abnormal; Notable for the following components:   Albumin 3.4 (*)    All other components within normal limits  MAGNESIUM - Abnormal; Notable for the following components:   Magnesium 2.7 (*)    All other components within normal limits  TROPONIN I (HIGH SENSITIVITY) - Abnormal; Notable for the following components:   Troponin I (High Sensitivity) 30 (*)    All other components within normal limits  TROPONIN I (HIGH SENSITIVITY) - Abnormal; Notable for  the following components:   Troponin I (High Sensitivity) 34 (*)    All other components within normal limits  RESP PANEL BY RT-PCR (FLU A&B, COVID) ARPGX2  CULTURE, BLOOD (ROUTINE X 2)  CULTURE, BLOOD (ROUTINE X 2)  LACTIC ACID, PLASMA  LACTIC ACID, PLASMA  AMMONIA  ETHANOL  PROCALCITONIN  URINALYSIS, COMPLETE (UACMP) WITH MICROSCOPIC  URINE DRUG SCREEN, QUALITATIVE ([facility] ONLY)  BASIC METABOLIC PANEL  BASIC METABOLIC PANEL  BASIC METABOLIC PANEL  BASIC METABOLIC PANEL  CBC WITH DIFFERENTIAL/PLATELET  MAGNESIUM  CBG MONITORING, ED   ____________________________________________  EKG  ED ECG REPORT I, Silvia Hightower J, the attending physician, personally viewed and interpreted this ECG.   Date: 04/23/2020  EKG Time: 2128  Rate: 103  Rhythm: sinus tachycardia  Axis: Normal  Intervals: QTC 573  ST&T Change: Nonspecific Compared with 10/2019 EKG, QTC was 533  ____________________________________________  RADIOLOGY I, Sadler Teschner J, personally viewed and evaluated these images (plain radiographs) as part of my medical decision making, as well as reviewing the written report by the radiologist.  ED MD interpretation: No ICH, acute pancreatitis, stable gallbladder distention without pericholecystic inflammatory changes, no ductal dilatation; no acute cardiopulmonary process  Official radiology report(s): CT Abdomen Pelvis Wo Contrast  Result Date: 04/23/2020 CLINICAL DATA:  Altered mental status EXAM: CT HEAD WITHOUT CONTRAST CT ABDOMEN AND PELVIS WITHOUT CONTRAST TECHNIQUE: Contiguous axial images were obtained from the base of the skull through the vertex without intravenous contrast. Multidetector CT imaging of the abdomen and pelvis was performed following the standard protocol without IV contrast. COMPARISON:  CT head 10/26/2019, CT abdomen pelvis 10/28/2019 FINDINGS: CT HEAD FINDINGS Brain: Normal anatomic configuration. No abnormal intra or extra-axial mass lesion or fluid  collection. No abnormal mass effect or midline shift. No evidence of acute intracranial hemorrhage or infarct. Ventricular size is normal. Cerebellum unremarkable. Vascular: Unremarkable Skull: Intact Sinuses/Orbits: Paranasal sinuses are clear. Orbits are unremarkable. Other: Mastoid air cells and middle ear cavities are clear. CT ABDOMEN AND PELVIS FINDINGS Lower chest: Previously noted bibasilar pulmonary infiltrates have resolved. The visualized lung bases are clear. Visualized heart and pericardium are unremarkable. Hepatobiliary: The gallbladder is distended, similar to that noted on prior examination, without pericholecystic inflammatory change identified. The liver is unremarkable. No intra or extrahepatic biliary ductal dilation identified. Pancreas: There is extensive fluid seen tracking within the greater omentum,  within the lesser sac surrounding the entire pancreas, and tracking into the small bowel mesentery. The distribution of inflammatory change favors changes of acute pancreatitis. Parenchymal viability is not well assessed on this noncontrast examination. The pancreatic duct is not dilated. No pancreatic parenchymal calcifications are seen. There is circumferential thickening of the second portion of the duodenum possibly related to the adjacent inflammatory process. No loculated peripancreatic fluid collections are identified. Spleen: Unremarkable Adrenals/Urinary Tract: The adrenal glands are unremarkable. Fine calcifications are seen within the kidneys bilaterally in keeping with changes of medullary nephrocalcinosis. Mild asymmetric cortical atrophy of the right kidney. No hydronephrosis. Bladder unremarkable. Stomach/Bowel: Stomach is within normal limits. Appendix appears normal. No evidence of bowel wall thickening, distention, or inflammatory changes. No free intraperitoneal gas or fluid. Vascular/Lymphatic: Extensive aortoiliac atherosclerotic calcification. No aortic aneurysm. No  pathologic adenopathy within the abdomen and pelvis. Reproductive: Status post hysterectomy. No adnexal masses. Other: No abdominal wall hernia. Musculoskeletal: No lytic or blastic bone lesions are seen. IMPRESSION: Extensive peripancreatic inflammatory change with fluid seen tracking into the mesentery and greater omentum in keeping with changes of acute pancreatitis. Pancreatic viability is not well assessed on this examination. No peripancreatic fluid collections identified. Medullary nephrocalcinosis. Mild asymmetric cortical atrophy of the right kidney, new since prior examination. Persistent distension of the gallbladder without superimposed pericholecystic inflammatory change possibly related to fasting state. Peripheral vascular disease. Aortic Atherosclerosis (ICD10-I70.0). Electronically Signed   By: Fidela Salisbury MD   On: 04/23/2020 00:03   DG Chest 1 View  Result Date: 04/23/2020 CLINICAL DATA:  Pneumonia EXAM: CHEST  1 VIEW COMPARISON:  04/22/2020 FINDINGS: The heart size and mediastinal contours are within normal limits. Both lungs are clear. The visualized skeletal structures are unremarkable. IMPRESSION: No active disease. Electronically Signed   By: Fidela Salisbury MD   On: 04/23/2020 03:15   CT Head Wo Contrast  Result Date: 04/23/2020 CLINICAL DATA:  Altered mental status EXAM: CT HEAD WITHOUT CONTRAST CT ABDOMEN AND PELVIS WITHOUT CONTRAST TECHNIQUE: Contiguous axial images were obtained from the base of the skull through the vertex without intravenous contrast. Multidetector CT imaging of the abdomen and pelvis was performed following the standard protocol without IV contrast. COMPARISON:  CT head 10/26/2019, CT abdomen pelvis 10/28/2019 FINDINGS: CT HEAD FINDINGS Brain: Normal anatomic configuration. No abnormal intra or extra-axial mass lesion or fluid collection. No abnormal mass effect or midline shift. No evidence of acute intracranial hemorrhage or infarct. Ventricular size is  normal. Cerebellum unremarkable. Vascular: Unremarkable Skull: Intact Sinuses/Orbits: Paranasal sinuses are clear. Orbits are unremarkable. Other: Mastoid air cells and middle ear cavities are clear. CT ABDOMEN AND PELVIS FINDINGS Lower chest: Previously noted bibasilar pulmonary infiltrates have resolved. The visualized lung bases are clear. Visualized heart and pericardium are unremarkable. Hepatobiliary: The gallbladder is distended, similar to that noted on prior examination, without pericholecystic inflammatory change identified. The liver is unremarkable. No intra or extrahepatic biliary ductal dilation identified. Pancreas: There is extensive fluid seen tracking within the greater omentum, within the lesser sac surrounding the entire pancreas, and tracking into the small bowel mesentery. The distribution of inflammatory change favors changes of acute pancreatitis. Parenchymal viability is not well assessed on this noncontrast examination. The pancreatic duct is not dilated. No pancreatic parenchymal calcifications are seen. There is circumferential thickening of the second portion of the duodenum possibly related to the adjacent inflammatory process. No loculated peripancreatic fluid collections are identified. Spleen: Unremarkable Adrenals/Urinary Tract: The adrenal glands are unremarkable. Fine calcifications are  seen within the kidneys bilaterally in keeping with changes of medullary nephrocalcinosis. Mild asymmetric cortical atrophy of the right kidney. No hydronephrosis. Bladder unremarkable. Stomach/Bowel: Stomach is within normal limits. Appendix appears normal. No evidence of bowel wall thickening, distention, or inflammatory changes. No free intraperitoneal gas or fluid. Vascular/Lymphatic: Extensive aortoiliac atherosclerotic calcification. No aortic aneurysm. No pathologic adenopathy within the abdomen and pelvis. Reproductive: Status post hysterectomy. No adnexal masses. Other: No abdominal wall  hernia. Musculoskeletal: No lytic or blastic bone lesions are seen. IMPRESSION: Extensive peripancreatic inflammatory change with fluid seen tracking into the mesentery and greater omentum in keeping with changes of acute pancreatitis. Pancreatic viability is not well assessed on this examination. No peripancreatic fluid collections identified. Medullary nephrocalcinosis. Mild asymmetric cortical atrophy of the right kidney, new since prior examination. Persistent distension of the gallbladder without superimposed pericholecystic inflammatory change possibly related to fasting state. Peripheral vascular disease. Aortic Atherosclerosis (ICD10-I70.0). Electronically Signed   By: Fidela Salisbury MD   On: 04/23/2020 00:03   DG Chest Port 1 View  Result Date: 04/22/2020 CLINICAL DATA:  Encephalopathy.  Altered mental status. EXAM: PORTABLE CHEST 1 VIEW COMPARISON:  10/26/2019 FINDINGS: Resolved bibasilar opacities from prior exam. The lungs are clear. The heart is normal in size with normal mediastinal contours. No pleural fluid or pneumothorax. No pulmonary edema. No acute osseous abnormalities are seen. IMPRESSION: Negative chest radiograph.  Resolved bibasilar opacities from prior. Electronically Signed   By: Keith Rake M.D.   On: 04/22/2020 23:53    ____________________________________________   PROCEDURES  Procedure(s) performed (including Critical Care):  .1-3 Lead EKG Interpretation Performed by: Paulette Blanch, MD Authorized by: Paulette Blanch, MD     Interpretation: abnormal     ECG rate:  103   ECG rate assessment: tachycardic     Rhythm: sinus tachycardia     Ectopy: none     Conduction: normal   Comments:     Placed on cardiac monitor to evaluate for arrhythmias     ____________________________________________   INITIAL IMPRESSION / ASSESSMENT AND PLAN / ED COURSE  As part of my medical decision making, I reviewed the following data within the electronic MEDICAL RECORD NUMBER  History obtained from family, Nursing notes reviewed and incorporated, Labs reviewed, EKG interpreted, Old chart reviewed, Radiograph reviewed, Discussed with admitting physician and Notes from prior ED visits     49 year old female presenting with altered mentation. Differential diagnosis includes, but is not limited to, alcohol, illicit or prescription medications, or other toxic ingestion; intracranial pathology such as stroke or intracerebral hemorrhage; fever or infectious causes including sepsis; hypoxemia and/or hypercarbia; uremia; trauma; endocrine related disorders such as diabetes, hypoglycemia, and thyroid-related diseases; hypertensive encephalopathy; etc.  Laboratory and imaging results demonstrate hyponatremia, hypokalemia, hypercalcemia, AKI with elevated anion gap, elevated lipase, elevated troponin which is likely secondary to demand ischemia, moderate leukocytosis.  CT scan consistent with acute pancreatitis.  Will initiate IV fluid resuscitation, IV potassium replacement.  Will discuss with hospitalist services for admission.      ____________________________________________   FINAL CLINICAL IMPRESSION(S) / ED DIAGNOSES  Final diagnoses:  Altered mental status, unspecified altered mental status type  AKI (acute kidney injury) ([place])  [place]ic encephalopathy  Hypokalemia  Acute pancreatitis, unspecified complication status, unspecified pancreatitis type  Hypercalcemia     ED Discharge Orders    None      *Please note:  Nakeysha Pasqual was evaluated in Emergency Department on 04/23/2020 for the symptoms described in the history of  present illness. She was evaluated in the context of the global COVID-19 pandemic, which necessitated consideration that the patient might be at risk for infection with the SARS-CoV-2 virus that causes COVID-19. Institutional protocols and algorithms that pertain to the evaluation of patients at risk for COVID-19 are in a state of rapid change based  on information released by regulatory bodies including the CDC and federal and state organizations. These policies and algorithms were followed during the patient's care in the ED.  Some ED evaluations and interventions may be delayed as a result of limited staffing during and the pandemic.*   Note:  This document was prepared using Dragon voice recognition software and may include unintentional dictation errors.   Paulette Blanch, MD 04/23/20 [phone]

## 2020-04-23 ENCOUNTER — Inpatient Hospital Stay: Payer: BC Managed Care – PPO

## 2020-04-23 ENCOUNTER — Encounter: Payer: Self-pay | Admitting: Internal Medicine

## 2020-04-23 ENCOUNTER — Other Ambulatory Visit: Payer: Self-pay

## 2020-04-23 DIAGNOSIS — E876 Hypokalemia: Secondary | ICD-10-CM

## 2020-04-23 DIAGNOSIS — K859 Acute pancreatitis without necrosis or infection, unspecified: Secondary | ICD-10-CM | POA: Diagnosis present

## 2020-04-23 DIAGNOSIS — G629 Polyneuropathy, unspecified: Secondary | ICD-10-CM | POA: Diagnosis present

## 2020-04-23 DIAGNOSIS — R4182 Altered mental status, unspecified: Secondary | ICD-10-CM | POA: Diagnosis present

## 2020-04-23 DIAGNOSIS — G9341 Metabolic encephalopathy: Secondary | ICD-10-CM

## 2020-04-23 DIAGNOSIS — N184 Chronic kidney disease, stage 4 (severe): Secondary | ICD-10-CM

## 2020-04-23 DIAGNOSIS — J45909 Unspecified asthma, uncomplicated: Secondary | ICD-10-CM | POA: Diagnosis present

## 2020-04-23 DIAGNOSIS — N179 Acute kidney failure, unspecified: Secondary | ICD-10-CM

## 2020-04-23 DIAGNOSIS — Z79899 Other long term (current) drug therapy: Secondary | ICD-10-CM | POA: Diagnosis not present

## 2020-04-23 DIAGNOSIS — Z7951 Long term (current) use of inhaled steroids: Secondary | ICD-10-CM | POA: Diagnosis not present

## 2020-04-23 DIAGNOSIS — R9431 Abnormal electrocardiogram [ECG] [EKG]: Secondary | ICD-10-CM

## 2020-04-23 DIAGNOSIS — E871 Hypo-osmolality and hyponatremia: Secondary | ICD-10-CM | POA: Diagnosis present

## 2020-04-23 DIAGNOSIS — Z20822 Contact with and (suspected) exposure to covid-19: Secondary | ICD-10-CM | POA: Diagnosis present

## 2020-04-23 DIAGNOSIS — Z888 Allergy status to other drugs, medicaments and biological substances status: Secondary | ICD-10-CM | POA: Diagnosis not present

## 2020-04-23 DIAGNOSIS — E872 Acidosis: Secondary | ICD-10-CM

## 2020-04-23 DIAGNOSIS — N3010 Interstitial cystitis (chronic) without hematuria: Secondary | ICD-10-CM | POA: Diagnosis present

## 2020-04-23 DIAGNOSIS — I7300 Raynaud's syndrome without gangrene: Secondary | ICD-10-CM | POA: Diagnosis present

## 2020-04-23 DIAGNOSIS — K219 Gastro-esophageal reflux disease without esophagitis: Secondary | ICD-10-CM

## 2020-04-23 DIAGNOSIS — I248 Other forms of acute ischemic heart disease: Secondary | ICD-10-CM | POA: Diagnosis present

## 2020-04-23 DIAGNOSIS — E861 Hypovolemia: Secondary | ICD-10-CM | POA: Diagnosis not present

## 2020-04-23 DIAGNOSIS — F17200 Nicotine dependence, unspecified, uncomplicated: Secondary | ICD-10-CM | POA: Diagnosis present

## 2020-04-23 LAB — BASIC METABOLIC PANEL
Anion gap: 15 (ref 5–15)
Anion gap: 16 — ABNORMAL HIGH (ref 5–15)
Anion gap: 14 (ref 5–15)
BUN: 71 mg/dL — ABNORMAL HIGH (ref 6–20)
BUN: 75 mg/dL — ABNORMAL HIGH (ref 6–20)
BUN: 77 mg/dL — ABNORMAL HIGH (ref 6–20)
CO2: 8 mmol/L — ABNORMAL LOW (ref 22–32)
CO2: 9 mmol/L — ABNORMAL LOW (ref 22–32)
CO2: 9 mmol/L — ABNORMAL LOW (ref 22–32)
Calcium: 9.5 mg/dL (ref 8.9–10.3)
Calcium: 9.1 mg/dL (ref 8.9–10.3)
Calcium: 8.8 mg/dL — ABNORMAL LOW (ref 8.9–10.3)
Chloride: 102 mmol/L (ref 98–111)
Chloride: 102 mmol/L (ref 98–111)
Chloride: 105 mmol/L (ref 98–111)
Creatinine, Ser: 3.92 mg/dL — ABNORMAL HIGH (ref 0.44–1.00)
Creatinine, Ser: 3.9 mg/dL — ABNORMAL HIGH (ref 0.44–1.00)
Creatinine, Ser: 4.02 mg/dL — ABNORMAL HIGH (ref 0.44–1.00)
GFR, Estimated: 13 mL/min — ABNORMAL LOW (ref >60)
GFR, Estimated: 14 mL/min — ABNORMAL LOW (ref >60)
GFR, Estimated: 13 mL/min — ABNORMAL LOW (ref >60)
Glucose, Bld: 114 mg/dL — ABNORMAL HIGH (ref 70–99)
Glucose, Bld: 89 mg/dL (ref 70–99)
Glucose, Bld: 82 mg/dL (ref 70–99)
Potassium: 3.4 mmol/L — ABNORMAL LOW (ref 3.5–5.1)
Potassium: 2.1 mmol/L — CL (ref 3.5–5.1)
Potassium: 2.3 mmol/L — CL (ref 3.5–5.1)
Sodium: 125 mmol/L — ABNORMAL LOW (ref 135–145)
Sodium: 127 mmol/L — ABNORMAL LOW (ref 135–145)
Sodium: 128 mmol/L — ABNORMAL LOW (ref 135–145)

## 2020-04-23 LAB — HEPATIC FUNCTION PANEL
ALT: 18 U/L (ref 0–44)
AST: 20 U/L (ref 15–41)
Albumin: 3.4 g/dL — ABNORMAL LOW (ref 3.5–5.0)
Alkaline Phosphatase: 104 U/L (ref 38–126)
Bilirubin, Direct: <0.1 mg/dL (ref 0.0–0.2)
Total Bilirubin: 0.8 mg/dL (ref 0.3–1.2)
Total Protein: 6.6 g/dL (ref 6.5–8.1)

## 2020-04-23 LAB — LIPID PANEL
Cholesterol: 137 mg/dL (ref 0–200)
HDL: 18 mg/dL — ABNORMAL LOW (ref >40)
LDL Cholesterol: 73 mg/dL (ref 0–99)
Total CHOL/HDL Ratio: 7.6 RATIO
Triglycerides: 228 mg/dL — ABNORMAL HIGH (ref <150)
VLDL: 46 mg/dL — ABNORMAL HIGH (ref 0–40)

## 2020-04-23 LAB — CBC WITH DIFFERENTIAL/PLATELET
Abs Immature Granulocytes: 0.05 10*3/uL (ref 0.00–0.07)
Basophils Absolute: 0.1 10*3/uL (ref 0.0–0.1)
Basophils Relative: 1 %
Eosinophils Absolute: 0.2 10*3/uL (ref 0.0–0.5)
Eosinophils Relative: 2 %
HCT: 31.3 % — ABNORMAL LOW (ref 36.0–46.0)
Hemoglobin: 11.4 g/dL — ABNORMAL LOW (ref 12.0–15.0)
Immature Granulocytes: 0 %
Lymphocytes Relative: 11 %
Lymphs Abs: 1.5 10*3/uL (ref 0.7–4.0)
MCH: 33 pg (ref 26.0–34.0)
MCHC: 36.4 g/dL — ABNORMAL HIGH (ref 30.0–36.0)
MCV: 90.7 fL (ref 80.0–100.0)
Monocytes Absolute: 1 10*3/uL (ref 0.1–1.0)
Monocytes Relative: 7 %
Neutro Abs: 11.1 10*3/uL — ABNORMAL HIGH (ref 1.7–7.7)
Neutrophils Relative %: 79 %
Platelets: 353 10*3/uL (ref 150–400)
RBC: 3.45 MIL/uL — ABNORMAL LOW (ref 3.87–5.11)
RDW: 14.3 % (ref 11.5–15.5)
WBC: 14 10*3/uL — ABNORMAL HIGH (ref 4.0–10.5)
nRBC: 0 % (ref 0.0–0.2)

## 2020-04-23 LAB — PROCALCITONIN: Procalcitonin: 5.46 ng/mL

## 2020-04-23 LAB — TROPONIN I (HIGH SENSITIVITY): Troponin I (High Sensitivity): 34 ng/L — ABNORMAL HIGH (ref <18)

## 2020-04-23 LAB — POTASSIUM: Potassium: 2.2 mmol/L — CL (ref 3.5–5.1)

## 2020-04-23 LAB — MAGNESIUM
Magnesium: 2.7 mg/dL — ABNORMAL HIGH (ref 1.7–2.4)
Magnesium: 2.4 mg/dL (ref 1.7–2.4)

## 2020-04-23 LAB — RESP PANEL BY RT-PCR (FLU A&B, COVID) ARPGX2
Influenza A by PCR: NEGATIVE
Influenza B by PCR: NEGATIVE
SARS Coronavirus 2 by RT PCR: NEGATIVE

## 2020-04-23 LAB — LACTIC ACID, PLASMA: Lactic Acid, Venous: 1.1 mmol/L (ref 0.5–1.9)

## 2020-04-23 MED ORDER — ACETAMINOPHEN 650 MG RE SUPP: 650.0000 mg | Freq: Four times a day (QID) | RECTAL | Status: DC | PRN

## 2020-04-23 MED ORDER — POTASSIUM CHLORIDE 10 MEQ/100ML IV SOLN
10.0000 meq | INTRAVENOUS | Status: AC
  Administered 2020-04-23 – 2020-04-24 (×4): 10 meq via INTRAVENOUS
  Filled 2020-04-23 (×4): qty 100

## 2020-04-23 MED ORDER — SODIUM BICARBONATE 8.4 % IV SOLN: Freq: Once | INTRAVENOUS | Status: DC

## 2020-04-23 MED ORDER — POTASSIUM CHLORIDE IN NACL 20-0.9 MEQ/L-% IV SOLN
INTRAVENOUS | Status: DC
  Filled 2020-04-23 (×7): qty 1000

## 2020-04-23 MED ORDER — LORATADINE 10 MG PO TABS: 10.0000 mg | ORAL_TABLET | Freq: Every day | ORAL | Status: DC | PRN

## 2020-04-23 MED ORDER — MOMETASONE FURO-FORMOTEROL FUM 200-5 MCG/ACT IN AERO
2.0000 | INHALATION_SPRAY | Freq: Two times a day (BID) | RESPIRATORY_TRACT | Status: DC
  Administered 2020-04-23 – 2020-04-26 (×6): 2 via RESPIRATORY_TRACT
  Filled 2020-04-23: qty 8.8

## 2020-04-23 MED ORDER — FLUTICASONE FUROATE-VILANTEROL 100-25 MCG/INH IN AEPB
1.0000 | INHALATION_SPRAY | Freq: Every day | RESPIRATORY_TRACT | Status: DC
  Administered 2020-04-24 – 2020-04-25 (×2): 1 via RESPIRATORY_TRACT
  Filled 2020-04-23 (×2): qty 28

## 2020-04-23 MED ORDER — THEOPHYLLINE ER 400 MG PO TB24
400.0000 mg | ORAL_TABLET | Freq: Two times a day (BID) | ORAL | Status: DC
  Administered 2020-04-23 – 2020-04-26 (×7): 400 mg via ORAL
  Filled 2020-04-23 (×9): qty 1

## 2020-04-23 MED ORDER — ENOXAPARIN SODIUM 30 MG/0.3ML ~~LOC~~ SOLN
30.0000 mg | SUBCUTANEOUS | Status: DC
  Administered 2020-04-23: 30 mg via SUBCUTANEOUS
  Filled 2020-04-23 (×2): qty 0.3

## 2020-04-23 MED ORDER — HEPARIN SODIUM (PORCINE) 5000 UNIT/ML IJ SOLN
5000.0000 [IU] | Freq: Three times a day (TID) | INTRAMUSCULAR | Status: DC
  Administered 2020-04-24 – 2020-04-26 (×6): 5000 [IU] via SUBCUTANEOUS
  Filled 2020-04-23 (×6): qty 1

## 2020-04-23 MED ORDER — ACETAMINOPHEN 325 MG PO TABS: 650.0000 mg | ORAL_TABLET | Freq: Four times a day (QID) | ORAL | Status: DC | PRN

## 2020-04-23 MED ORDER — ONDANSETRON HCL 4 MG/2ML IJ SOLN: 4.0000 mg | Freq: Four times a day (QID) | INTRAMUSCULAR | Status: DC | PRN

## 2020-04-23 MED ORDER — ENOXAPARIN SODIUM 40 MG/0.4ML ~~LOC~~ SOLN: 40.0000 mg | SUBCUTANEOUS | Status: DC

## 2020-04-23 MED ORDER — SODIUM BICARBONATE 8.4 % IV SOLN
Freq: Once | INTRAVENOUS | Status: AC
  Filled 2020-04-23: qty 850

## 2020-04-23 MED ORDER — ONDANSETRON HCL 4 MG PO TABS: 4.0000 mg | ORAL_TABLET | Freq: Four times a day (QID) | ORAL | Status: DC | PRN

## 2020-04-23 MED ORDER — POTASSIUM CHLORIDE CRYS ER 20 MEQ PO TBCR
40.0000 meq | EXTENDED_RELEASE_TABLET | Freq: Once | ORAL | Status: AC
  Administered 2020-04-23: 40 meq via ORAL
  Filled 2020-04-23: qty 2

## 2020-04-23 MED ORDER — SODIUM BICARBONATE 8.4 % IV SOLN
INTRAVENOUS | Status: DC
  Filled 2020-04-23: qty 150
  Filled 2020-04-23: qty 850
  Filled 2020-04-23: qty 150
  Filled 2020-04-23 (×3): qty 850

## 2020-04-23 MED ORDER — THIAMINE HCL 100 MG PO TABS
100.0000 mg | ORAL_TABLET | Freq: Every day | ORAL | Status: DC
Start: 2020-04-23 — End: 2020-04-26
  Administered 2020-04-23 – 2020-04-26 (×4): 100 mg via ORAL
  Filled 2020-04-23 (×4): qty 1

## 2020-04-23 MED ORDER — TAMSULOSIN HCL 0.4 MG PO CAPS
0.4000 mg | ORAL_CAPSULE | Freq: Every day | ORAL | Status: DC
Start: 2020-04-23 — End: 2020-04-26
  Administered 2020-04-23 – 2020-04-26 (×4): 0.4 mg via ORAL
  Filled 2020-04-23 (×4): qty 1

## 2020-04-23 MED ORDER — SODIUM CHLORIDE 0.9 % IV BOLUS
1000.0000 mL | Freq: Once | INTRAVENOUS | Status: AC
  Administered 2020-04-23: 1000 mL via INTRAVENOUS

## 2020-04-23 MED ORDER — ALBUTEROL SULFATE HFA 108 (90 BASE) MCG/ACT IN AERS
2.0000 | INHALATION_SPRAY | RESPIRATORY_TRACT | Status: DC | PRN
  Filled 2020-04-23: qty 6.7

## 2020-04-23 MED ORDER — FLUTICASONE PROPIONATE 50 MCG/ACT NA SUSP
2.0000 | Freq: Two times a day (BID) | NASAL | Status: DC
  Administered 2020-04-23 – 2020-04-26 (×6): 2 via NASAL
  Filled 2020-04-23 (×2): qty 16

## 2020-04-23 MED ORDER — PANTOPRAZOLE SODIUM 40 MG IV SOLR
40.0000 mg | INTRAVENOUS | Status: DC
  Administered 2020-04-23 – 2020-04-26 (×4): 40 mg via INTRAVENOUS
  Filled 2020-04-23 (×4): qty 40

## 2020-04-23 NOTE — ED Notes (Addendum)
External catheter placed.

## 2020-04-23 NOTE — Progress Notes (Addendum)
PHARMACIST - PHYSICIAN COMMUNICATION  CONCERNING:  Enoxaparin (Lovenox) for DVT Prophylaxis    RECOMMENDATION: Patient was prescribed enoxaprin 40mg  q24 hours for VTE prophylaxis.   Filed Weights   04/23/20 0631  Weight: 52.2 kg (115 lb)    Body mass index is 16.98 kg/m.  Estimated Creatinine Clearance: 13.9 mL/min (A) (by C-G formula based on SCr of 4.08 mg/dL (H)).  Patient is candidate for enoxaparin 30mg  every 24 hours based on CrCl <60ml/min or Weight <45kg  DESCRIPTION: Pharmacy has adjusted enoxaparin dose per [facility] policy.  Patient is now receiving enoxaparin 30 mg every 24 hours   Renda Rolls, PharmD, [facility] 04/23/2020 6:38 AM

## 2020-04-23 NOTE — Progress Notes (Signed)
[facility] [place] NAME: Katherine Perez    MR#:  [id number]  DATE OF BIRTH:  [birth year]  SUBJECTIVE:  patient came in with increasing lethargy and confusion at home. She is more awake conversant. Husband at bedside. Patient has multiple medical problems for which she follows with PCP as outpatient. She came in with epigastric abdominal pain. States her pain today 7/10 however is asking when she can eat food. No vomiting. Has some nausea.   REVIEW OF SYSTEMS:   Review of Systems  Constitutional: Negative for chills, fever and weight loss.  HENT: Negative for ear discharge, ear pain and nosebleeds.   Eyes: Negative for blurred vision, pain and discharge.  Respiratory: Negative for sputum production, shortness of breath, wheezing and stridor.   Cardiovascular: Negative for chest pain, palpitations, orthopnea and PND.  Gastrointestinal: Positive for abdominal pain and nausea. Negative for diarrhea and vomiting.  Genitourinary: Negative for frequency and urgency.  Musculoskeletal: Positive for joint pain. Negative for back pain.  Neurological: Positive for weakness. Negative for sensory change, speech change and focal weakness.  Psychiatric/Behavioral: Negative for depression and hallucinations. The patient is not nervous/anxious.    Tolerating Diet:npo with ice chips Tolerating PT: uses walker at home  DRUG ALLERGIES:   Allergies  Allergen Reactions  . Cetirizine Hives  . Diazepam Other (See Comments)    Depression   . Baclofen Anxiety and Other (See Comments)    AMS - "Really messed me up, caused me to drop things"     VITALS:  Blood pressure 116/68, pulse 81, temperature 97.8 F (36.6 C), temperature source Oral, resp. rate 16, weight 52.2 kg, SpO2 100 %.  PHYSICAL EXAMINATION:   Physical Exam  GENERAL:  50 y.o.-year-old patient lying in the bed with no acute distress. Looks older than age 50: Head atraumatic,  normocephalic. Oropharynx and nasopharynx clear.  LUNGS: Normal breath sounds bilaterally, no wheezing, rales, rhonchi. No use of accessory muscles of respiration.  CARDIOVASCULAR: S1, S2 normal. No murmurs, rubs, or gallops.  ABDOMEN: Soft, + tenderness epigastric area, nondistended. Bowel sounds present. No organomegaly or mass.  EXTREMITIES: No cyanosis, clubbing or edema b/l.    NEUROLOGIC: grossly non focal PSYCHIATRIC:  patient is alert and oriented x2 SKIN: No obvious rash, lesion, or ulcer.   LABORATORY PANEL:  CBC Recent Labs  Lab 04/23/20 0430  WBC 14.0*  HGB 11.4*  HCT 31.3*  PLT 353    Chemistries  Recent Labs  Lab 04/23/20 0020 04/23/20 0430 04/23/20 1116  NA  --  125* 127*  K  --  3.4* 2.1*  CL  --  102 102  CO2  --  8* 9*  GLUCOSE  --  114* 89  BUN  --  71* 75*  CREATININE  --  3.92* 3.90*  CALCIUM  --  9.5 9.1  MG 2.7* 2.4  --   AST 20  --   --   ALT 18  --   --   ALKPHOS 104  --   --   BILITOT 0.8  --   --    Cardiac Enzymes No results for input(s): TROPONINI in the last 168 hours. RADIOLOGY:  CT Abdomen Pelvis Wo Contrast  Result Date: 04/23/2020 CLINICAL DATA:  Altered mental status EXAM: CT HEAD WITHOUT CONTRAST CT ABDOMEN AND PELVIS WITHOUT CONTRAST TECHNIQUE: Contiguous axial images were obtained from the base of the skull through the vertex without intravenous contrast. Multidetector CT imaging of  the abdomen and pelvis was performed following the standard protocol without IV contrast. COMPARISON:  CT head 10/26/2019, CT abdomen pelvis 10/28/2019 FINDINGS: CT HEAD FINDINGS Brain: Normal anatomic configuration. No abnormal intra or extra-axial mass lesion or fluid collection. No abnormal mass effect or midline shift. No evidence of acute intracranial hemorrhage or infarct. Ventricular size is normal. Cerebellum unremarkable. Vascular: Unremarkable Skull: Intact Sinuses/Orbits: Paranasal sinuses are clear. Orbits are unremarkable. Other: Mastoid air  cells and middle ear cavities are clear. CT ABDOMEN AND PELVIS FINDINGS Lower chest: Previously noted bibasilar pulmonary infiltrates have resolved. The visualized lung bases are clear. Visualized heart and pericardium are unremarkable. Hepatobiliary: The gallbladder is distended, similar to that noted on prior examination, without pericholecystic inflammatory change identified. The liver is unremarkable. No intra or extrahepatic biliary ductal dilation identified. Pancreas: There is extensive fluid seen tracking within the greater omentum, within the lesser sac surrounding the entire pancreas, and tracking into the small bowel mesentery. The distribution of inflammatory change favors changes of acute pancreatitis. Parenchymal viability is not well assessed on this noncontrast examination. The pancreatic duct is not dilated. No pancreatic parenchymal calcifications are seen. There is circumferential thickening of the second portion of the duodenum possibly related to the adjacent inflammatory process. No loculated peripancreatic fluid collections are identified. Spleen: Unremarkable Adrenals/Urinary Tract: The adrenal glands are unremarkable. Fine calcifications are seen within the kidneys bilaterally in keeping with changes of medullary nephrocalcinosis. Mild asymmetric cortical atrophy of the right kidney. No hydronephrosis. Bladder unremarkable. Stomach/Bowel: Stomach is within normal limits. Appendix appears normal. No evidence of bowel wall thickening, distention, or inflammatory changes. No free intraperitoneal gas or fluid. Vascular/Lymphatic: Extensive aortoiliac atherosclerotic calcification. No aortic aneurysm. No pathologic adenopathy within the abdomen and pelvis. Reproductive: Status post hysterectomy. No adnexal masses. Other: No abdominal wall hernia. Musculoskeletal: No lytic or blastic bone lesions are seen. IMPRESSION: Extensive peripancreatic inflammatory change with fluid seen tracking into the  mesentery and greater omentum in keeping with changes of acute pancreatitis. Pancreatic viability is not well assessed on this examination. No peripancreatic fluid collections identified. Medullary nephrocalcinosis. Mild asymmetric cortical atrophy of the right kidney, new since prior examination. Persistent distension of the gallbladder without superimposed pericholecystic inflammatory change possibly related to fasting state. Peripheral vascular disease. Aortic Atherosclerosis (ICD10-I70.0). Electronically Signed   By: Fidela Salisbury MD   On: 04/23/2020 00:03   DG Chest 1 View  Result Date: 04/23/2020 CLINICAL DATA:  Pneumonia EXAM: CHEST  1 VIEW COMPARISON:  04/22/2020 FINDINGS: The heart size and mediastinal contours are within normal limits. Both lungs are clear. The visualized skeletal structures are unremarkable. IMPRESSION: No active disease. Electronically Signed   By: Fidela Salisbury MD   On: 04/23/2020 03:15   CT Head Wo Contrast  Result Date: 04/23/2020 CLINICAL DATA:  Altered mental status EXAM: CT HEAD WITHOUT CONTRAST CT ABDOMEN AND PELVIS WITHOUT CONTRAST TECHNIQUE: Contiguous axial images were obtained from the base of the skull through the vertex without intravenous contrast. Multidetector CT imaging of the abdomen and pelvis was performed following the standard protocol without IV contrast. COMPARISON:  CT head 10/26/2019, CT abdomen pelvis 10/28/2019 FINDINGS: CT HEAD FINDINGS Brain: Normal anatomic configuration. No abnormal intra or extra-axial mass lesion or fluid collection. No abnormal mass effect or midline shift. No evidence of acute intracranial hemorrhage or infarct. Ventricular size is normal. Cerebellum unremarkable. Vascular: Unremarkable Skull: Intact Sinuses/Orbits: Paranasal sinuses are clear. Orbits are unremarkable. Other: Mastoid air cells and middle ear cavities are clear. CT  ABDOMEN AND PELVIS FINDINGS Lower chest: Previously noted bibasilar pulmonary infiltrates have  resolved. The visualized lung bases are clear. Visualized heart and pericardium are unremarkable. Hepatobiliary: The gallbladder is distended, similar to that noted on prior examination, without pericholecystic inflammatory change identified. The liver is unremarkable. No intra or extrahepatic biliary ductal dilation identified. Pancreas: There is extensive fluid seen tracking within the greater omentum, within the lesser sac surrounding the entire pancreas, and tracking into the small bowel mesentery. The distribution of inflammatory change favors changes of acute pancreatitis. Parenchymal viability is not well assessed on this noncontrast examination. The pancreatic duct is not dilated. No pancreatic parenchymal calcifications are seen. There is circumferential thickening of the second portion of the duodenum possibly related to the adjacent inflammatory process. No loculated peripancreatic fluid collections are identified. Spleen: Unremarkable Adrenals/Urinary Tract: The adrenal glands are unremarkable. Fine calcifications are seen within the kidneys bilaterally in keeping with changes of medullary nephrocalcinosis. Mild asymmetric cortical atrophy of the right kidney. No hydronephrosis. Bladder unremarkable. Stomach/Bowel: Stomach is within normal limits. Appendix appears normal. No evidence of bowel wall thickening, distention, or inflammatory changes. No free intraperitoneal gas or fluid. Vascular/Lymphatic: Extensive aortoiliac atherosclerotic calcification. No aortic aneurysm. No pathologic adenopathy within the abdomen and pelvis. Reproductive: Status post hysterectomy. No adnexal masses. Other: No abdominal wall hernia. Musculoskeletal: No lytic or blastic bone lesions are seen. IMPRESSION: Extensive peripancreatic inflammatory change with fluid seen tracking into the mesentery and greater omentum in keeping with changes of acute pancreatitis. Pancreatic viability is not well assessed on this examination.  No peripancreatic fluid collections identified. Medullary nephrocalcinosis. Mild asymmetric cortical atrophy of the right kidney, new since prior examination. Persistent distension of the gallbladder without superimposed pericholecystic inflammatory change possibly related to fasting state. Peripheral vascular disease. Aortic Atherosclerosis (ICD10-I70.0). Electronically Signed   By: Fidela Salisbury MD   On: 04/23/2020 00:03   DG Chest Port 1 View  Result Date: 04/22/2020 CLINICAL DATA:  Encephalopathy.  Altered mental status. EXAM: PORTABLE CHEST 1 VIEW COMPARISON:  10/26/2019 FINDINGS: Resolved bibasilar opacities from prior exam. The lungs are clear. The heart is normal in size with normal mediastinal contours. No pleural fluid or pneumothorax. No pulmonary edema. No acute osseous abnormalities are seen. IMPRESSION: Negative chest radiograph.  Resolved bibasilar opacities from prior. Electronically Signed   By: Keith Rake M.D.   On: 04/22/2020 23:53   US Abdomen Limited RUQ (LIVER/GB)  Result Date: 04/23/2020 CLINICAL DATA:  Abdominal pain EXAM: ULTRASOUND ABDOMEN LIMITED RIGHT UPPER QUADRANT COMPARISON:  None. FINDINGS: Gallbladder: Gallbladder is distended. No pericholecystic fluid or wall thickening. A negative sonographic Percell Miller sign was reported by the sonographer. No cholelithiasis. Common bile duct: Diameter: 7 mm Liver: No focal lesion identified. Within normal limits in parenchymal echogenicity. Portal vein is patent on color Doppler imaging with normal direction of blood flow towards the liver. Other: None. IMPRESSION: Distended gallbladder without cholelithiasis or other evidence of acute cholecystitis. Electronically Signed   By: Ulyses Jarred M.D.   On: 04/23/2020 04:44   ASSESSMENT AND PLAN:  50 year old female with past medical history of chronic kidney disease stage IV, multifactorial anemia (iron deficiency and anemia of chronic kidney disease), chronic interstitial cystitis,  chronic constipation with history of chronic laxative abuse, Raynaud's phenomenon and history of multidrug-resistant urinary tract infections who presents to [facility] emergency department with lethargy and confusion. Patient has been experiencing epigastric pain for the past 3 to 4 days  Acute pancreatitis without infection or necrosis--new  onset Etiology unclear -Patient presenting with several days of epigastric pain, poor oral intake and intermittent vomiting with presentation complicated by acute kidney injury --Lipase found to be 1127 with CT imaging the abdomen and pelvis consistent with acute pancreatitis without obvious infection or necrosis or pseudocyst.  --Keeping patient n.p.o. with exception of ice chips and occasional oral medications for now  --Hydrating patient with intravenous isotonic fluids  -- Lipid profile--TG 288--wwill place on rx for it  --Husband reports pt has not drank ETOH for >1 month -S ETOH <10 -If patient fails to clinically improve over the next several days will consider GI consultation -USG abd shows distended GB w/o inflammation or stones  AG metabolic acidodis in the setting of acute pancreatitis Acute renal failure superimposed on stage 4 chronic kidney disease ([place]) -Patient suffering from substantial acute kidney injury superimposed on chronic kidney disease --This is likely secondary to profound volume depletion poor oral intake over the past several days and occasional vomiting --Per review of outpatient nephrology notes, patient has had ongoing metabolic acidosis secondary to her renal disease even prior to this and has not been consistent with her sodium bicarbonate therapy due to GI intolerance --Hydrating patient with sodium bicarbonate infusion --Strict input and output monitoring --Monitoring renal function and electrolytes with serial chemistries --Consider nephrology consultation if patient fails to rapidly  improve -out patient labs pts Bicarb is 12.3--15.8--non complaint with po bicarb as out pt    Acute metabolic encephalopathy--resolved -Patient presenting with several days of progressive lethargy and confusion -- patient more clear and conversely today. Husband is at bedside.    Hyponatremia -Patient exhibiting substantial hyponatremia, likely secondary to volume depletion --Due to profoundly volume depleted state, patient has required several boluses in the emergency department administered by the emergency department staff -came in with Sodium of 126--127    Hypokalemia, inadequate intake - likely secondary to poor oral intake and intermittent nausea and vomiting as of late -- pharmacy to monitor electrolytes and replete --Magnesium is within normal limits   GERD without esophagitis ---Intravenous Protonix for now  Leukocytosis -Patient presenting with substantial leukocytosis --no source of infection noted -could be reactive from pancreatitis -15K--14K  Code Status:  Full code Family Communication: d/w husband at bedside  At baseline pt uses Walker at home. She has h/o bilateral meniscal tears--follows with [facility] pt just finished Home PT dec 1st.  Status is: Inpatient   Dispo: The patient is from: Home  Anticipated d/c is to: Home  Anticipated d/c date is: > 3 days  Patient currently is not medically stable to d/c. treatment for Acute pancreatitis, metabolic acidosis  Procedures:none Family communication : husband in the room Consults :none CODE STATUS: FULL DVT Prophylaxis :lovenox     TOTAL TIME TAKING CARE OF THIS PATIENT: *25* minutes.  >50% time spent on counselling and coordination of care  Note: This dictation was prepared with Dragon dictation along with smaller phrase technology. Any transcriptional errors that result from this process are unintentional.  Fritzi Mandes M.D    [facility]    CC: Primary care physician; [facility]Patient ID: Katherine Perez, female   DOB: [birth year], 50 y.o.   MRN: [id number]

## 2020-04-23 NOTE — Progress Notes (Signed)
PHARMACY CONSULT NOTE - FOLLOW UP  Pharmacy Consult for Potassium Monitoring and Replacement   Recent Labs: Potassium (mmol/L)  Date Value  04/23/2020 2.2 (LL)   Magnesium (mg/dL)  Date Value  04/23/2020 2.4   Calcium (mg/dL)  Date Value  04/23/2020 8.8 (L)   Albumin (g/dL)  Date Value  04/23/2020 3.4 (L)   Phosphorus (mg/dL)  Date Value  10/29/2019 3.3   Sodium (mmol/L)  Date Value  04/23/2020 128 (L)     Assessment: 50 yo female with past medical history of chronic kidney disease stage IV, multifactorial anemia (iron deficiency and anemia of chronic kidney disease), chronic interstitial cystitis, chronic constipation with history of chronic laxative abuse, Raynaud's phenomenon and history of multidrug-resistant urinary tract infections who presents to the ED with lethargy and confusion. Pharmacy has been consulted for replacement of potassium. Patient received Kcl 63mEq IV x 4 runs - last run finished 1/1 @ 0645. K s/p IV Kcl remains low at 2.1. Patient also has NS with KCl 103mEq/L ordered @ 18ml/hr (~66mEq K). Explained to nursing PO does not absorb as well compared to IV and normally would order IV Kcl with K of 2.1 - nurse requesting PO Kcl.   1/1 1900 K 2.2  Goal of Therapy:  K WNL  Plan:  Provider has ordered KCl 85mEq IV x 4 runs. Will follow up with am labs.  Monitor K daily and replace as needed   Paulina Fusi, PharmD, BCPS 04/23/2020 8:57 PM

## 2020-04-23 NOTE — Progress Notes (Signed)
At 11:50 lab called with a critical Potassium of 2.1; Dr. Posey Pronto notified and ordered for pharmacy consult to be placed for potassium management via po.  Order placed as directed by Dr. Posey Pronto.

## 2020-04-23 NOTE — H&P (Addendum)
History and Physical    Katherine Perez RWE:[id number] DOB: 05-23-1970 DOA: 04/22/2020  PCP: [place]  Patient coming from: home brought in by spouse   Chief Complaint:  Chief Complaint  Patient presents with  . Altered Mental Status  . Weakness     HPI:    50 year old female with past medical history of chronic kidney disease stage IV, multifactorial anemia (iron deficiency and anemia of chronic kidney disease), chronic interstitial cystitis, chronic constipation with history of chronic laxative abuse, Raynaud's phenomenon and history of multidrug-resistant urinary tract infections who presents to [facility] emergency department with lethargy and confusion.  Patient is an extremely poor historian and is unable to provide a thorough history. I have attempted to call the husband and have been unsuccessful. Majority the history is been obtained from discussion with the emergency department provider and associated documentation.  Patient has been experiencing epigastric pain for the past 3 to 4 days. This is been associated with poor oral intake. Over the same span of time patient is also exhibited progressively worsening lethargy and confusion. Patient has exhibited occasional nonbilious nonbloody vomiting. There has been no recent history of fever, recent travel, sick contacts or contact with confirmed COVID-19 infection.  Patient symptoms of confusion continued to worsen until patient was eventually brought into [facility] emergency department by her husband for evaluation.  Review of Systems:   Review of Systems  Unable to perform ROS: Mental status change    Past Medical History:  Diagnosis Date  . Anemia   . CKD (chronic kidney disease)   . ETOH abuse   . GERD (gastroesophageal reflux disease)   . IC (interstitial cystitis)   . Laxative abuse   . Neuropathy   . Raynaud disease     Past Surgical History:  Procedure  Laterality Date  . LEG SURGERY       reports that she has been smoking. She has never used smokeless tobacco. She reports current alcohol use. She reports previous drug use.  Allergies  Allergen Reactions  . Cetirizine Hives  . Diazepam Other (See Comments)    Depression   . Baclofen Anxiety and Other (See Comments)    AMS - "Really messed me up, caused me to drop things"     Family History  Problem Relation Age of Onset  . Other Neg Hx      Prior to Admission medications   Medication Sig Start Date End Date Taking? Authorizing Provider  albuterol (VENTOLIN HFA) 108 (90 Base) MCG/ACT inhaler Inhale 2 puffs into the lungs every 4 (four) hours as needed. 06/07/19   [provider]  Calcium Polycarbophil (FIBER) 625 MG TABS Take 2 capsules by mouth 2 (two) times daily.    [provider]  famotidine (PEPCID) 40 MG tablet Take 40 mg by mouth daily. 10/07/19   [provider]  fexofenadine (ALLEGRA) 180 MG tablet Take 180 mg by mouth daily as needed for allergies.    [provider]  fluticasone (FLONASE) 50 MCG/ACT nasal spray Place 2 sprays into both nostrils 2 (two) times daily.  09/14/19   [provider]  Fluticasone-Salmeterol (ADVAIR) 100-50 MCG/DOSE AEPB Inhale 1 puff into the lungs 2 (two) times daily. 09/14/19   [provider]  Fluticasone-Salmeterol (ADVAIR) 250-50 MCG/DOSE AEPB Inhale 1 puff into the lungs 2 (two) times daily. 09/14/19   [provider]  gabapentin (NEURONTIN) 300 MG capsule Take 300 mg by mouth 5 (five) times  daily as needed. 10/07/19   [provider]  metoCLOPramide (REGLAN) 10 MG tablet Take 20 mg by mouth in the morning and at bedtime.  10/17/19   [provider]  ondansetron (ZOFRAN) 8 MG tablet Take 16 mg by mouth 2 (two) times daily.  10/23/19   [provider]  promethazine (PHENERGAN) 25 MG tablet Take 25 mg by mouth every 8 (eight) hours as needed for nausea or  vomiting.  10/23/19   [provider]  psyllium (FIBER LAXATIVE) 0.52 g capsule Take 2 capsules by mouth 2 (two) times daily. Patient not taking: Reported on 10/26/2019    [provider]  RABEprazole (ACIPHEX) 20 MG tablet Take 20 mg by mouth in the morning and at bedtime.  10/12/19   [provider]  tamsulosin (FLOMAX) 0.4 MG CAPS capsule Take 0.4 mg by mouth daily. 10/10/19   [provider]  theophylline (UNIPHYL) 400 MG 24 hr tablet Take 400 mg by mouth 2 (two) times daily.  10/10/19   [provider]  thiamine 100 MG tablet Take 1 tablet (100 mg total) by mouth daily. 11/03/19   Bonnielee Haff, MD  traMADol (ULTRAM) 50 MG tablet Take 50 mg by mouth 3 (three) times daily as needed. 10/03/19   [provider]  TRULANCE 3 MG TABS Take 3 mg by mouth daily at 6 (six) AM.  10/12/19   [provider]    Physical Exam: Vitals:   04/23/20 0011 04/23/20 0034 04/23/20 0200 04/23/20 0300  BP: 125/67 135/69 140/66 (!) 122/50  Pulse: 94 94 85 89  Resp: 18 18 16 16   Temp:      TempSrc:      SpO2: 98% 98% 100% 99%    Constitutional: Patient is lethargic but arousable and oriented x2. Patient is currently not in any acute distress.   Skin: Question mild jaundice of the skin. No rashes, no lesions, poor skin turgor noted. Eyes: Pupils are equally reactive to light.  No evidence of scleral icterus or conjunctival pallor.  ENMT: Dry mucous membranes noted.  Posterior pharynx clear of any exudate or lesions.   Neck: normal, supple, no masses, no thyromegaly.  No evidence of jugular venous distension.   Respiratory: clear to auscultation bilaterally, no wheezing, no crackles. Normal respiratory effort. No accessory muscle use.  Cardiovascular: Regular rate and rhythm, 2/6 systolic murmur noted, no extremity edema. 2+ pedal pulses. No carotid bruits.  Chest:   Nontender without crepitus or deformity.   Back:   Nontender without crepitus or  deformity. Abdomen: Epigastric tenderness noted. Abdomen is soft however. Positive bowel sounds noted in all quadrants.   Musculoskeletal: No joint deformity upper and lower extremities. Good ROM, no contractures. Normal muscle tone.  Neurologic: Patient is lethargic but arousable and oriented x2. Patient is inconsistently following commands. Sensation is grossly intact. Patient is positive for lipase stimuli.  Psychiatric: Patient exhibits depressed mood and flat affect. Patient does currently seem to possess insight as to her current situation.  Labs on Admission: I have personally reviewed following labs and imaging studies -   CBC: Recent Labs  Lab 04/22/20 2141  WBC 15.4*  HGB 14.2  HCT 41.7  MCV 94.3  PLT 409*   Basic Metabolic Panel: Recent Labs  Lab 04/22/20 2141 04/23/20 0020  NA 126*  --   K 2.5*  --   CL 99  --   CO2 9*  --   GLUCOSE 91  --   BUN 70*  --  CREATININE 4.08*  --   CALCIUM 12.0*  --   MG  --  2.7*   GFR: CrCl cannot be calculated (Unknown ideal weight.). Liver Function Tests: Recent Labs  Lab 04/23/20 0020  AST 20  ALT 18  ALKPHOS 104  BILITOT 0.8  PROT 6.6  ALBUMIN 3.4*   Recent Labs  Lab 04/22/20 2141  LIPASE 1,127*   Recent Labs  Lab 04/22/20 2140  AMMONIA 12   Coagulation Profile: No results for input(s): INR, PROTIME in the last 168 hours. Cardiac Enzymes: No results for input(s): CKTOTAL, CKMB, CKMBINDEX, TROPONINI in the last 168 hours. BNP (last 3 results) No results for input(s): PROBNP in the last 8760 hours. HbA1C: No results for input(s): HGBA1C in the last 72 hours. CBG: No results for input(s): GLUCAP in the last 168 hours. Lipid Profile: No results for input(s): CHOL, HDL, LDLCALC, TRIG, CHOLHDL, LDLDIRECT in the last 72 hours. Thyroid Function Tests: No results for input(s): TSH, T4TOTAL, FREET4, T3FREE, THYROIDAB in the last 72 hours. Anemia Panel: No results for input(s): VITAMINB12, FOLATE, FERRITIN,  TIBC, IRON, RETICCTPCT in the last 72 hours. Urine analysis:    Component Value Date/Time   COLORURINE BIOCHEMICALS MAY BE AFFECTED BY COLOR (A) 10/26/2019 1231   APPEARANCEUR CLOUDY (A) 10/26/2019 1231   LABSPEC 1.008 10/26/2019 1231   PHURINE 5.0 10/26/2019 1231   GLUCOSEU NEGATIVE 10/26/2019 1231   HGBUR MODERATE (A) 10/26/2019 1231   BILIRUBINUR NEGATIVE 10/26/2019 1231   KETONESUR 5 (A) 10/26/2019 1231   PROTEINUR 30 (A) 10/26/2019 1231   NITRITE POSITIVE (A) 10/26/2019 1231   LEUKOCYTESUR LARGE (A) 10/26/2019 1231    Radiological Exams on Admission - Personally Reviewed: CT Abdomen Pelvis Wo Contrast  Result Date: 04/23/2020 CLINICAL DATA:  Altered mental status EXAM: CT HEAD WITHOUT CONTRAST CT ABDOMEN AND PELVIS WITHOUT CONTRAST TECHNIQUE: Contiguous axial images were obtained from the base of the skull through the vertex without intravenous contrast. Multidetector CT imaging of the abdomen and pelvis was performed following the standard protocol without IV contrast. COMPARISON:  CT head 10/26/2019, CT abdomen pelvis 10/28/2019 FINDINGS: CT HEAD FINDINGS Brain: Normal anatomic configuration. No abnormal intra or extra-axial mass lesion or fluid collection. No abnormal mass effect or midline shift. No evidence of acute intracranial hemorrhage or infarct. Ventricular size is normal. Cerebellum unremarkable. Vascular: Unremarkable Skull: Intact Sinuses/Orbits: Paranasal sinuses are clear. Orbits are unremarkable. Other: Mastoid air cells and middle ear cavities are clear. CT ABDOMEN AND PELVIS FINDINGS Lower chest: Previously noted bibasilar pulmonary infiltrates have resolved. The visualized lung bases are clear. Visualized heart and pericardium are unremarkable. Hepatobiliary: The gallbladder is distended, similar to that noted on prior examination, without pericholecystic inflammatory change identified. The liver is unremarkable. No intra or extrahepatic biliary ductal dilation identified.  Pancreas: There is extensive fluid seen tracking within the greater omentum, within the lesser sac surrounding the entire pancreas, and tracking into the small bowel mesentery. The distribution of inflammatory change favors changes of acute pancreatitis. Parenchymal viability is not well assessed on this noncontrast examination. The pancreatic duct is not dilated. No pancreatic parenchymal calcifications are seen. There is circumferential thickening of the second portion of the duodenum possibly related to the adjacent inflammatory process. No loculated peripancreatic fluid collections are identified. Spleen: Unremarkable Adrenals/Urinary Tract: The adrenal glands are unremarkable. Fine calcifications are seen within the kidneys bilaterally in keeping with changes of medullary nephrocalcinosis. Mild asymmetric cortical atrophy of the right kidney. No hydronephrosis. Bladder unremarkable. Stomach/Bowel: Stomach is within  normal limits. Appendix appears normal. No evidence of bowel wall thickening, distention, or inflammatory changes. No free intraperitoneal gas or fluid. Vascular/Lymphatic: Extensive aortoiliac atherosclerotic calcification. No aortic aneurysm. No pathologic adenopathy within the abdomen and pelvis. Reproductive: Status post hysterectomy. No adnexal masses. Other: No abdominal wall hernia. Musculoskeletal: No lytic or blastic bone lesions are seen. IMPRESSION: Extensive peripancreatic inflammatory change with fluid seen tracking into the mesentery and greater omentum in keeping with changes of acute pancreatitis. Pancreatic viability is not well assessed on this examination. No peripancreatic fluid collections identified. Medullary nephrocalcinosis. Mild asymmetric cortical atrophy of the right kidney, new since prior examination. Persistent distension of the gallbladder without superimposed pericholecystic inflammatory change possibly related to fasting state. Peripheral vascular disease. Aortic  Atherosclerosis (ICD10-I70.0). Electronically Signed   By: Fidela Salisbury MD   On: 04/23/2020 00:03   DG Chest 1 View  Result Date: 04/23/2020 CLINICAL DATA:  Pneumonia EXAM: CHEST  1 VIEW COMPARISON:  04/22/2020 FINDINGS: The heart size and mediastinal contours are within normal limits. Both lungs are clear. The visualized skeletal structures are unremarkable. IMPRESSION: No active disease. Electronically Signed   By: Fidela Salisbury MD   On: 04/23/2020 03:15   CT Head Wo Contrast  Result Date: 04/23/2020 CLINICAL DATA:  Altered mental status EXAM: CT HEAD WITHOUT CONTRAST CT ABDOMEN AND PELVIS WITHOUT CONTRAST TECHNIQUE: Contiguous axial images were obtained from the base of the skull through the vertex without intravenous contrast. Multidetector CT imaging of the abdomen and pelvis was performed following the standard protocol without IV contrast. COMPARISON:  CT head 10/26/2019, CT abdomen pelvis 10/28/2019 FINDINGS: CT HEAD FINDINGS Brain: Normal anatomic configuration. No abnormal intra or extra-axial mass lesion or fluid collection. No abnormal mass effect or midline shift. No evidence of acute intracranial hemorrhage or infarct. Ventricular size is normal. Cerebellum unremarkable. Vascular: Unremarkable Skull: Intact Sinuses/Orbits: Paranasal sinuses are clear. Orbits are unremarkable. Other: Mastoid air cells and middle ear cavities are clear. CT ABDOMEN AND PELVIS FINDINGS Lower chest: Previously noted bibasilar pulmonary infiltrates have resolved. The visualized lung bases are clear. Visualized heart and pericardium are unremarkable. Hepatobiliary: The gallbladder is distended, similar to that noted on prior examination, without pericholecystic inflammatory change identified. The liver is unremarkable. No intra or extrahepatic biliary ductal dilation identified. Pancreas: There is extensive fluid seen tracking within the greater omentum, within the lesser sac surrounding the entire pancreas, and  tracking into the small bowel mesentery. The distribution of inflammatory change favors changes of acute pancreatitis. Parenchymal viability is not well assessed on this noncontrast examination. The pancreatic duct is not dilated. No pancreatic parenchymal calcifications are seen. There is circumferential thickening of the second portion of the duodenum possibly related to the adjacent inflammatory process. No loculated peripancreatic fluid collections are identified. Spleen: Unremarkable Adrenals/Urinary Tract: The adrenal glands are unremarkable. Fine calcifications are seen within the kidneys bilaterally in keeping with changes of medullary nephrocalcinosis. Mild asymmetric cortical atrophy of the right kidney. No hydronephrosis. Bladder unremarkable. Stomach/Bowel: Stomach is within normal limits. Appendix appears normal. No evidence of bowel wall thickening, distention, or inflammatory changes. No free intraperitoneal gas or fluid. Vascular/Lymphatic: Extensive aortoiliac atherosclerotic calcification. No aortic aneurysm. No pathologic adenopathy within the abdomen and pelvis. Reproductive: Status post hysterectomy. No adnexal masses. Other: No abdominal wall hernia. Musculoskeletal: No lytic or blastic bone lesions are seen. IMPRESSION: Extensive peripancreatic inflammatory change with fluid seen tracking into the mesentery and greater omentum in keeping with changes of acute pancreatitis. Pancreatic viability is not  well assessed on this examination. No peripancreatic fluid collections identified. Medullary nephrocalcinosis. Mild asymmetric cortical atrophy of the right kidney, new since prior examination. Persistent distension of the gallbladder without superimposed pericholecystic inflammatory change possibly related to fasting state. Peripheral vascular disease. Aortic Atherosclerosis (ICD10-I70.0). Electronically Signed   By: Fidela Salisbury MD   On: 04/23/2020 00:03   DG Chest Port 1 View  Result  Date: 04/22/2020 CLINICAL DATA:  Encephalopathy.  Altered mental status. EXAM: PORTABLE CHEST 1 VIEW COMPARISON:  10/26/2019 FINDINGS: Resolved bibasilar opacities from prior exam. The lungs are clear. The heart is normal in size with normal mediastinal contours. No pleural fluid or pneumothorax. No pulmonary edema. No acute osseous abnormalities are seen. IMPRESSION: Negative chest radiograph.  Resolved bibasilar opacities from prior. Electronically Signed   By: Keith Rake M.D.   On: 04/22/2020 23:53    EKG: Personally reviewed.  Rhythm is sinus tachycardia with heart rate of 103 bpm. Evidence of prolonged QTc of 517ms.   no dynamic ST segment changes appreciated.  Assessment/Plan Principal Problem:   Acute pancreatitis without infection or necrosis   Patient presenting with several days of epigastric pain, poor oral intake and intermittent vomiting with presentation complicated by acute kidney injury  Lipase found to be 1127 with CT imaging the abdomen and pelvis consistent with acute pancreatitis without obvious infection or necrosis or pseudocyst.   Keeping patient n.p.o. with exception of ice chips and occasional oral medications for now   Hydrating patient with intravenous isotonic fluids   Concerning etiology, patient states that she has been essentially abstinent from a heavy alcohol use for approximately 2 years. Obtaining lipid panel, urine toxicology screen, RUQ quadrant ultrasound   If patient fails to clinically improve over the next several days will consider GI consultation  Active Problems:   Acute renal failure superimposed on stage 4 chronic kidney disease ([place])   Patient suffering from substantial acute kidney injury superimposed on chronic kidney disease  This is likely secondary to profound volume depletion poor oral intake over the past several days and occasional vomiting  Acute kidney injury complicated by severe metabolic acidosis  Per review of  outpatient nephrology notes, patient has had ongoing metabolic acidosis secondary to her renal disease even prior to this and has not been consistent with her sodium bicarbonate therapy due to GI intolerance  Hydrating patient with sodium bicarbonate infusion  Strict input and output monitoring  Attempting to minimize nephrotoxic agents if at all possible  Monitoring renal function and electrolytes with serial chemistries  Obtaining urine electrolytes, urinalysis  Consider nephrology consultation if patient fails to rapidly improve   Increased anion gap metabolic acidosis  Patient exhibiting substantial metabolic acidosis with anion gap thought to be secondary to advanced renal disease  Patient has longstanding history of metabolic acidosis due to renal disease and has been intermittently compliant with oral sodium bicarbonate supplementation in the past  Treating with sodium bicarb infusion for now which can hopefully be transitioned back to oral sodium bicarbonate therapy later in the hospitalization  Monitoring degree of acidosis with serial chemistries    Acute metabolic encephalopathy   Patient presenting with several days of progressive lethargy and confusion  Patient currently intermittently confused and disoriented here in the emergency department  Have attempted to contact the husband via phone and have been unsuccessful with my phone calls going straight to voicemail.  Encephalopathy possibly multifactorial secondary to volume depletion and mild uremia  Underlying infection is also possible with leukocytosis.  Therefore obtaining urinalysis to evaluate for urinary tract infection as well as chest x-ray and will treat accordingly based on these results  Will expand work-up for encephalopathy further if patient fails to rapidly clinically improve.    Hyponatremia   Patient exhibiting substantial hyponatremia, likely secondary to volume depletion  Due to profoundly  volume depleted state, patient has required several boluses in the emergency department administered by the emergency department staff  Will obtain stat rapid chemistry and perform serial chemistries to monitor sodium levels to ensure sodium is not corrected too rapidly.    Hypokalemia, inadequate intake   Patient exhibiting substantial hypokalemia likely secondary to poor oral intake and intermittent nausea and vomiting as of late  Patient has received intravenous potassium chloride as a means to replace potassium stores  Magnesium is within normal limits  Monitoring potassium levels with serial chemistries  Prolonged QTc   Substantial prolonged QT noted on initial EKG in excess of 560 ms  1st and foremost, electrolytes must be corrected and therefore we are actively attempting to correct hypokalemia  Attempt to minimize any QT prolonging agents  Monitoring patient on telemetry    GERD without esophagitis    Intravenous Protonix for now  Leukocytosis   Patient presenting with substantial leukocytosis  Considering concurrent presentation with encephalopathy and acute kidney injury, performing basic work-up for underlying infection with chest x-ray and urinalysis  We will treat patient accordingly based on these results.   Code Status:  Full code Family Communication: I have attempted to contact the husband via phone but unfortunately phone calls are going straight to voicemail.  Status is: Inpatient  Not inpatient appropriate, will call UM team and downgrade to OBS.   Dispo: The patient is from: Home              Anticipated d/c is to: Home              Anticipated d/c date is: > 3 days              Patient currently is not medically stable to d/c.        Vernelle Emerald MD [facility] Pager [phone]  If 7PM-7AM, please contact night-coverage [email or web address] Use universal [place] password for that web site. If you do not have the  password, please call the hospital operator.  04/23/2020, 3:22 AM

## 2020-04-23 NOTE — Plan of Care (Signed)
Continuing with plan of care. 

## 2020-04-23 NOTE — Progress Notes (Signed)
PHARMACY CONSULT NOTE - FOLLOW UP  Pharmacy Consult for Potassium Monitoring and Replacement   Recent Labs: Potassium (mmol/L)  Date Value  04/23/2020 2.1 (LL)   Magnesium (mg/dL)  Date Value  04/23/2020 2.4   Calcium (mg/dL)  Date Value  04/23/2020 9.1   Albumin (g/dL)  Date Value  04/23/2020 3.4 (L)   Phosphorus (mg/dL)  Date Value  10/29/2019 3.3   Sodium (mmol/L)  Date Value  04/23/2020 127 (L)     Assessment: 50 yo female with past medical history of chronic kidney disease stage IV, multifactorial anemia (iron deficiency and anemia of chronic kidney disease), chronic interstitial cystitis, chronic constipation with history of chronic laxative abuse, Raynaud's phenomenon and history of multidrug-resistant urinary tract infections who presents to the ED with lethargy and confusion. Pharmacy has been consulted for replacement of potassium. Patient received Kcl 75mEq IV x 4 runs - last run finished 1/1 @ 0645. K s/p IV Kcl remains low at 2.1. Patient also has NS with KCl 61mEq/L ordered @ 147ml/hr (~71mEq K). Explained to nursing PO does not absorb as well compared to IV and normally would order IV Kcl with K of 2.1 - nurse requesting PO Kcl.    Goal of Therapy:  K WNL  Plan:  Explained to nursing PO does not absorb as well compared to IV and normally would order IV Kcl with K of 2.1 - nurse requesting PO KCl. Will order PO KCl 42mEq x1 and recheck level at 1900  Monitor K daily and replace as needed   Sherilyn Banker, PharmD Pharmacy Resident  04/23/2020 12:05 PM

## 2020-04-23 NOTE — ED Notes (Signed)
Per report, pt to go to floor bed when telemetry box found. Nursing supervisor and charge nurse aware of delay.

## 2020-04-24 DIAGNOSIS — N179 Acute kidney failure, unspecified: Secondary | ICD-10-CM | POA: Diagnosis not present

## 2020-04-24 DIAGNOSIS — G9341 Metabolic encephalopathy: Secondary | ICD-10-CM | POA: Diagnosis not present

## 2020-04-24 DIAGNOSIS — K859 Acute pancreatitis without necrosis or infection, unspecified: Secondary | ICD-10-CM | POA: Diagnosis not present

## 2020-04-24 LAB — BASIC METABOLIC PANEL
Anion gap: 16 — ABNORMAL HIGH (ref 5–15)
Anion gap: 15 (ref 5–15)
BUN: 81 mg/dL — ABNORMAL HIGH (ref 6–20)
BUN: 80 mg/dL — ABNORMAL HIGH (ref 6–20)
CO2: 10 mmol/L — ABNORMAL LOW (ref 22–32)
CO2: 13 mmol/L — ABNORMAL LOW (ref 22–32)
Calcium: 8.4 mg/dL — ABNORMAL LOW (ref 8.9–10.3)
Calcium: 8.2 mg/dL — ABNORMAL LOW (ref 8.9–10.3)
Chloride: 105 mmol/L (ref 98–111)
Chloride: 102 mmol/L (ref 98–111)
Creatinine, Ser: 4.15 mg/dL — ABNORMAL HIGH (ref 0.44–1.00)
Creatinine, Ser: 4.12 mg/dL — ABNORMAL HIGH (ref 0.44–1.00)
GFR, Estimated: 13 mL/min — ABNORMAL LOW (ref >60)
GFR, Estimated: 13 mL/min — ABNORMAL LOW (ref >60)
Glucose, Bld: 102 mg/dL — ABNORMAL HIGH (ref 70–99)
Glucose, Bld: 98 mg/dL (ref 70–99)
Potassium: 2.5 mmol/L — CL (ref 3.5–5.1)
Potassium: 2.8 mmol/L — ABNORMAL LOW (ref 3.5–5.1)
Sodium: 131 mmol/L — ABNORMAL LOW (ref 135–145)
Sodium: 130 mmol/L — ABNORMAL LOW (ref 135–145)

## 2020-04-24 LAB — PHOSPHORUS: Phosphorus: 4.2 mg/dL (ref 2.5–4.6)

## 2020-04-24 LAB — POTASSIUM: Potassium: 3 mmol/L — ABNORMAL LOW (ref 3.5–5.1)

## 2020-04-24 LAB — LIPASE, BLOOD: Lipase: 159 U/L — ABNORMAL HIGH (ref 11–51)

## 2020-04-24 LAB — MAGNESIUM: Magnesium: 2 mg/dL (ref 1.7–2.4)

## 2020-04-24 MED ORDER — POTASSIUM CHLORIDE CRYS ER 20 MEQ PO TBCR
40.0000 meq | EXTENDED_RELEASE_TABLET | Freq: Once | ORAL | Status: AC
  Administered 2020-04-24: 40 meq via ORAL
  Filled 2020-04-24: qty 2

## 2020-04-24 MED ORDER — POTASSIUM CHLORIDE CRYS ER 20 MEQ PO TBCR: 40.0000 meq | EXTENDED_RELEASE_TABLET | ORAL | Status: DC

## 2020-04-24 MED ORDER — POTASSIUM CHLORIDE 10 MEQ/100ML IV SOLN: 10.0000 meq | INTRAVENOUS | Status: DC

## 2020-04-24 MED ORDER — NICOTINE 21 MG/24HR TD PT24
21.0000 mg | MEDICATED_PATCH | Freq: Every day | TRANSDERMAL | Status: DC
  Administered 2020-04-24 – 2020-04-26 (×3): 21 mg via TRANSDERMAL
  Filled 2020-04-24 (×3): qty 1

## 2020-04-24 MED ORDER — POTASSIUM CHLORIDE 10 MEQ/100ML IV SOLN
10.0000 meq | INTRAVENOUS | Status: AC
  Administered 2020-04-24 (×4): 10 meq via INTRAVENOUS
  Filled 2020-04-24 (×4): qty 100

## 2020-04-24 NOTE — Progress Notes (Addendum)
PHARMACY CONSULT NOTE - FOLLOW UP  Pharmacy Consult for Potassium Monitoring and Replacement   Recent Labs: Potassium (mmol/L)  Date Value  04/24/2020 2.8 (L)   Magnesium (mg/dL)  Date Value  04/24/2020 2.0   Calcium (mg/dL)  Date Value  04/24/2020 8.2 (L)   Albumin (g/dL)  Date Value  04/23/2020 3.4 (L)   Phosphorus (mg/dL)  Date Value  04/24/2020 4.2   Sodium (mmol/L)  Date Value  04/24/2020 130 (L)     Assessment: 50 yo female with past medical history of chronic kidney disease stage IV, multifactorial anemia (iron deficiency and anemia of chronic kidney disease), chronic interstitial cystitis, chronic constipation with history of chronic laxative abuse, Raynaud's phenomenon and history of multidrug-resistant urinary tract infections who presents to the ED with lethargy and confusion. Pharmacy has been consulted for replacement of potassium. Patient received Kcl 46mEq IV x 4 runs - last run finished 1/1 @ 0645. K s/p IV Kcl remains low at 2.1. Patient also has NS with KCl 12mEq/L ordered @ 127ml/hr (~60mEq K). Explained to nursing PO does not absorb as well compared to IV and normally would order IV Kcl with K of 2.1 - nurse requesting PO Kcl.   1/1 1900 K 2.2 1/2 0117 K 2.5 1/2 0921 K 2.8  Goal of Therapy:  K WNL  Plan:  Provider has ordered KCl 54mEq IV x 4 runs.  Pt also on NS w 80meq KCL @ 37ml/hr  Will follow up after runs complete and replete again if needed   Monitor K daily and replace as needed   Lu Duffel, PharmD, BCPS Clinical Pharmacist 04/24/2020 11:05 AM

## 2020-04-24 NOTE — Plan of Care (Signed)
Continuing with plan of care. 

## 2020-04-24 NOTE — Progress Notes (Signed)
[facility]  ROUNDING NOTE   Subjective:  Patient well-known to [place] from the office as we follow her for chronic kidney disease stage IV. Comes now with pancreatitis, enlarged gallbladder, and acute kidney injury. States that she did drink some alcohol around Christmas time. Has multiple electrolyte disturbances with hyponatremia, hyperkalemia, severe metabolic acidosis, and acute kidney injury.  Her baseline EGFR is 27.   Objective:  Vital signs in last 24 hours:  Temp:  [97.6 F (36.4 C)-97.9 F (36.6 C)] 97.6 F (36.4 C) (01/02 1400) Pulse Rate:  [70-97] 85 (01/02 1400) Resp:  [16-18] 16 (01/02 1400) BP: (101-119)/(58-75) 107/74 (01/02 1400) SpO2:  [99 %-100 %] 99 % (01/02 1400)  Weight change:  Filed Weights   04/23/20 0631  Weight: 52.2 kg    Intake/Output: I/O last 3 completed shifts: In: 4463.9 [I.V.:3037; IV Piggyback:1426.9] Out: -    Intake/Output this shift:  Total I/O In: 476.7 [I.V.:103.3; IV Piggyback:373.3] Out: -   Physical Exam: General:  No acute distress  Head:  Normocephalic, atraumatic. Moist oral mucosal membranes  Eyes:  Anicteric  Neck:  Supple  Lungs:   Clear to auscultation, normal effort  Heart:  S1S2 no rubs  Abdomen:   Mild epigastric tenderness noted.  Extremities:  Trace peripheral edema.  Neurologic:  Awake, alert, following commands  Skin:  No acute skin rash  Access:  No hemodialysis access    Basic Metabolic Panel: Recent Labs  Lab 04/23/20 0020 04/23/20 0430 04/23/20 1116 04/23/20 1541 04/23/20 1859 04/24/20 0117 04/24/20 0921  NA  --  125* 127* 128*  --  131* 130*  K  --  3.4* 2.1* 2.3* 2.2* 2.5* 2.8*  CL  --  102 102 105  --  105 102  CO2  --  8* 9* 9*  --  10* 13*  GLUCOSE  --  114* 89 82  --  102* 98  BUN  --  71* 75* 77*  --  81* 80*  CREATININE  --  3.92* 3.90* 4.02*  --  4.15* 4.12*  CALCIUM  --  9.5 9.1 8.8*  --  8.4* 8.2*  MG 2.7* 2.4  --   --   --  2.0  --   PHOS  --   --   --   --   --   4.2  --     Liver Function Tests: Recent Labs  Lab 04/23/20 0020  AST 20  ALT 18  ALKPHOS 104  BILITOT 0.8  PROT 6.6  ALBUMIN 3.4*   Recent Labs  Lab 04/22/20 2141 04/24/20 0921  LIPASE 1,127* 159*   Recent Labs  Lab 04/22/20 2140  AMMONIA 12    CBC: Recent Labs  Lab 04/22/20 2141 04/23/20 0430  WBC 15.4* 14.0*  NEUTROABS  --  11.1*  HGB 14.2 11.4*  HCT 41.7 31.3*  MCV 94.3 90.7  PLT 438* 353    Cardiac Enzymes: No results for input(s): CKTOTAL, CKMB, CKMBINDEX, TROPONINI in the last 168 hours.  BNP: Invalid input(s): POCBNP  CBG: No results for input(s): GLUCAP in the last 168 hours.  Microbiology: Results for orders placed or performed during the hospital encounter of 04/22/20  Resp Panel by RT-PCR (Flu A&B, Covid) Nasopharyngeal Swab     Status: None   Collection Time: 04/23/20 12:20 AM   Specimen: Nasopharyngeal Swab; Nasopharyngeal(NP) swabs in vial transport medium  Result Value Ref Range Status   SARS Coronavirus 2 by RT PCR NEGATIVE NEGATIVE Final  Comment: (NOTE) SARS-CoV-2 target nucleic acids are NOT DETECTED.  The SARS-CoV-2 RNA is generally detectable in upper respiratory specimens during the acute phase of infection. The lowest concentration of SARS-CoV-2 viral copies this assay can detect is 138 copies/mL. A negative result does not preclude SARS-Cov-2 infection and should not be used as the sole basis for treatment or other patient management decisions. A negative result may occur with  improper specimen collection/handling, submission of specimen other than nasopharyngeal swab, presence of viral mutation(s) within the areas targeted by this assay, and inadequate number of viral copies(<138 copies/mL). A negative result must be combined with clinical observations, patient history, and epidemiological information. The expected result is Negative.  Fact Sheet for Patients:  EntrepreneurPulse.com.au  Fact Sheet  for Healthcare Providers:  IncredibleEmployment.be  This test is no t yet approved or cleared by the [place] FDA and  has been authorized for detection and/or diagnosis of SARS-CoV-2 by FDA under an Emergency Use Authorization (EUA). This EUA will remain  in effect (meaning this test can be used) for the duration of the COVID-19 declaration under Section 564(b)(1) of the Act, 21 U.S.C.section 360bbb-3(b)(1), unless the authorization is terminated  or revoked sooner.       Influenza A by PCR NEGATIVE NEGATIVE Final   Influenza B by PCR NEGATIVE NEGATIVE Final    Comment: (NOTE) The Xpert Xpress SARS-CoV-2/FLU/RSV plus assay is intended as an aid in the diagnosis of influenza from Nasopharyngeal swab specimens and should not be used as a sole basis for treatment. Nasal washings and aspirates are unacceptable for Xpert Xpress SARS-CoV-2/FLU/RSV testing.  Fact Sheet for Patients: EntrepreneurPulse.com.au  Fact Sheet for Healthcare Providers: IncredibleEmployment.be  This test is not yet approved or cleared by the [place] FDA and has been authorized for detection and/or diagnosis of SARS-CoV-2 by FDA under an Emergency Use Authorization (EUA). This EUA will remain in effect (meaning this test can be used) for the duration of the COVID-19 declaration under Section 564(b)(1) of the Act, 21 U.S.C. section 360bbb-3(b)(1), unless the authorization is terminated or revoked.  Performed at [facility], [address] [place], [place] [address]   Culture, blood (routine x 2)     Status: None (Preliminary result)   Collection Time: 04/23/20 12:20 AM   Specimen: BLOOD  Result Value Ref Range Status   Specimen Description BLOOD RIGHT ANTECUBITAL  Final   Special Requests   Final    BOTTLES DRAWN AEROBIC AND ANAEROBIC Blood Culture results may not be optimal due to an inadequate volume of blood received in culture  bottles   Culture   Final    NO GROWTH 1 DAY Performed at [facility], [address], [place], [place] [address]    Report Status PENDING  Incomplete  Culture, blood (routine x 2)     Status: None (Preliminary result)   Collection Time: 04/23/20 12:20 AM   Specimen: BLOOD  Result Value Ref Range Status   Specimen Description BLOOD BLOOD RIGHT FOREARM  Final   Special Requests   Final    BOTTLES DRAWN AEROBIC AND ANAEROBIC Blood Culture adequate volume   Culture   Final    NO GROWTH 1 DAY Performed at [facility], [address], [place], [place] [address]    Report Status PENDING  Incomplete    Coagulation Studies: No results for input(s): LABPROT, INR in the last 72 hours.  Urinalysis: No results for input(s): COLORURINE, LABSPEC, [place], [place], [place], [place], [place], [place],  UROBILINOGEN, NITRITE, LEUKOCYTESUR in the last 72 hours.  Invalid input(s): APPERANCEUR    Imaging: CT Abdomen Pelvis Wo Contrast  Result Date: 04/23/2020 CLINICAL DATA:  Altered mental status EXAM: CT HEAD WITHOUT CONTRAST CT ABDOMEN AND PELVIS WITHOUT CONTRAST TECHNIQUE: Contiguous axial images were obtained from the base of the skull through the vertex without intravenous contrast. Multidetector CT imaging of the abdomen and pelvis was performed following the standard protocol without IV contrast. COMPARISON:  CT head 10/26/2019, CT abdomen pelvis 10/28/2019 FINDINGS: CT HEAD FINDINGS Brain: Normal anatomic configuration. No abnormal intra or extra-axial mass lesion or fluid collection. No abnormal mass effect or midline shift. No evidence of acute intracranial hemorrhage or infarct. Ventricular size is normal. Cerebellum unremarkable. Vascular: Unremarkable Skull: Intact Sinuses/Orbits: Paranasal sinuses are clear. Orbits are unremarkable. Other: Mastoid air cells and middle ear cavities are clear. CT ABDOMEN AND PELVIS FINDINGS Lower chest: Previously noted bibasilar  pulmonary infiltrates have resolved. The visualized lung bases are clear. Visualized heart and pericardium are unremarkable. Hepatobiliary: The gallbladder is distended, similar to that noted on prior examination, without pericholecystic inflammatory change identified. The liver is unremarkable. No intra or extrahepatic biliary ductal dilation identified. Pancreas: There is extensive fluid seen tracking within the greater omentum, within the lesser sac surrounding the entire pancreas, and tracking into the small bowel mesentery. The distribution of inflammatory change favors changes of acute pancreatitis. Parenchymal viability is not well assessed on this noncontrast examination. The pancreatic duct is not dilated. No pancreatic parenchymal calcifications are seen. There is circumferential thickening of the second portion of the duodenum possibly related to the adjacent inflammatory process. No loculated peripancreatic fluid collections are identified. Spleen: Unremarkable Adrenals/Urinary Tract: The adrenal glands are unremarkable. Fine calcifications are seen within the kidneys bilaterally in keeping with changes of medullary nephrocalcinosis. Mild asymmetric cortical atrophy of the right kidney. No hydronephrosis. Bladder unremarkable. Stomach/Bowel: Stomach is within normal limits. Appendix appears normal. No evidence of bowel wall thickening, distention, or inflammatory changes. No free intraperitoneal gas or fluid. Vascular/Lymphatic: Extensive aortoiliac atherosclerotic calcification. No aortic aneurysm. No pathologic adenopathy within the abdomen and pelvis. Reproductive: Status post hysterectomy. No adnexal masses. Other: No abdominal wall hernia. Musculoskeletal: No lytic or blastic bone lesions are seen. IMPRESSION: Extensive peripancreatic inflammatory change with fluid seen tracking into the mesentery and greater omentum in keeping with changes of acute pancreatitis. Pancreatic viability is not well  assessed on this examination. No peripancreatic fluid collections identified. Medullary nephrocalcinosis. Mild asymmetric cortical atrophy of the right kidney, new since prior examination. Persistent distension of the gallbladder without superimposed pericholecystic inflammatory change possibly related to fasting state. Peripheral vascular disease. Aortic Atherosclerosis (ICD10-I70.0). Electronically Signed   By: Fidela Salisbury MD   On: 04/23/2020 00:03   DG Chest 1 View  Result Date: 04/23/2020 CLINICAL DATA:  Pneumonia EXAM: CHEST  1 VIEW COMPARISON:  04/22/2020 FINDINGS: The heart size and mediastinal contours are within normal limits. Both lungs are clear. The visualized skeletal structures are unremarkable. IMPRESSION: No active disease. Electronically Signed   By: Fidela Salisbury MD   On: 04/23/2020 03:15   CT Head Wo Contrast  Result Date: 04/23/2020 CLINICAL DATA:  Altered mental status EXAM: CT HEAD WITHOUT CONTRAST CT ABDOMEN AND PELVIS WITHOUT CONTRAST TECHNIQUE: Contiguous axial images were obtained from the base of the skull through the vertex without intravenous contrast. Multidetector CT imaging of the abdomen and pelvis was performed following the standard protocol without IV contrast. COMPARISON:  CT head 10/26/2019, CT abdomen  pelvis 10/28/2019 FINDINGS: CT HEAD FINDINGS Brain: Normal anatomic configuration. No abnormal intra or extra-axial mass lesion or fluid collection. No abnormal mass effect or midline shift. No evidence of acute intracranial hemorrhage or infarct. Ventricular size is normal. Cerebellum unremarkable. Vascular: Unremarkable Skull: Intact Sinuses/Orbits: Paranasal sinuses are clear. Orbits are unremarkable. Other: Mastoid air cells and middle ear cavities are clear. CT ABDOMEN AND PELVIS FINDINGS Lower chest: Previously noted bibasilar pulmonary infiltrates have resolved. The visualized lung bases are clear. Visualized heart and pericardium are unremarkable. Hepatobiliary:  The gallbladder is distended, similar to that noted on prior examination, without pericholecystic inflammatory change identified. The liver is unremarkable. No intra or extrahepatic biliary ductal dilation identified. Pancreas: There is extensive fluid seen tracking within the greater omentum, within the lesser sac surrounding the entire pancreas, and tracking into the small bowel mesentery. The distribution of inflammatory change favors changes of acute pancreatitis. Parenchymal viability is not well assessed on this noncontrast examination. The pancreatic duct is not dilated. No pancreatic parenchymal calcifications are seen. There is circumferential thickening of the second portion of the duodenum possibly related to the adjacent inflammatory process. No loculated peripancreatic fluid collections are identified. Spleen: Unremarkable Adrenals/Urinary Tract: The adrenal glands are unremarkable. Fine calcifications are seen within the kidneys bilaterally in keeping with changes of medullary nephrocalcinosis. Mild asymmetric cortical atrophy of the right kidney. No hydronephrosis. Bladder unremarkable. Stomach/Bowel: Stomach is within normal limits. Appendix appears normal. No evidence of bowel wall thickening, distention, or inflammatory changes. No free intraperitoneal gas or fluid. Vascular/Lymphatic: Extensive aortoiliac atherosclerotic calcification. No aortic aneurysm. No pathologic adenopathy within the abdomen and pelvis. Reproductive: Status post hysterectomy. No adnexal masses. Other: No abdominal wall hernia. Musculoskeletal: No lytic or blastic bone lesions are seen. IMPRESSION: Extensive peripancreatic inflammatory change with fluid seen tracking into the mesentery and greater omentum in keeping with changes of acute pancreatitis. Pancreatic viability is not well assessed on this examination. No peripancreatic fluid collections identified. Medullary nephrocalcinosis. Mild asymmetric cortical atrophy of  the right kidney, new since prior examination. Persistent distension of the gallbladder without superimposed pericholecystic inflammatory change possibly related to fasting state. Peripheral vascular disease. Aortic Atherosclerosis (ICD10-I70.0). Electronically Signed   By: Fidela Salisbury MD   On: 04/23/2020 00:03   DG Chest Port 1 View  Result Date: 04/22/2020 CLINICAL DATA:  Encephalopathy.  Altered mental status. EXAM: PORTABLE CHEST 1 VIEW COMPARISON:  10/26/2019 FINDINGS: Resolved bibasilar opacities from prior exam. The lungs are clear. The heart is normal in size with normal mediastinal contours. No pleural fluid or pneumothorax. No pulmonary edema. No acute osseous abnormalities are seen. IMPRESSION: Negative chest radiograph.  Resolved bibasilar opacities from prior. Electronically Signed   By: Keith Rake M.D.   On: 04/22/2020 23:53   US Abdomen Limited RUQ (LIVER/GB)  Result Date: 04/23/2020 CLINICAL DATA:  Abdominal pain EXAM: ULTRASOUND ABDOMEN LIMITED RIGHT UPPER QUADRANT COMPARISON:  None. FINDINGS: Gallbladder: Gallbladder is distended. No pericholecystic fluid or wall thickening. A negative sonographic Percell Miller sign was reported by the sonographer. No cholelithiasis. Common bile duct: Diameter: 7 mm Liver: No focal lesion identified. Within normal limits in parenchymal echogenicity. Portal vein is patent on color Doppler imaging with normal direction of blood flow towards the liver. Other: None. IMPRESSION: Distended gallbladder without cholelithiasis or other evidence of acute cholecystitis. Electronically Signed   By: Ulyses Jarred M.D.   On: 04/23/2020 04:44     Medications:   . 0.9 % NaCl with KCl 20 mEq / L 50  mL/hr at 04/24/20 1338  . sodium bicarbonate (isotonic) 150 mEq in D5W 1000 mL infusion 75 mL/hr at 04/24/20 1340   . fluticasone  2 spray Each Nare BID  . fluticasone furoate-vilanterol  1 puff Inhalation Daily  . heparin injection (subcutaneous)  5,000 Units  Subcutaneous Q8H  . mometasone-formoterol  2 puff Inhalation BID  . nicotine  21 mg Transdermal Daily  . pantoprazole (PROTONIX) IV  40 mg Intravenous Q24H  . tamsulosin  0.4 mg Oral Daily  . theophylline  400 mg Oral BID  . thiamine  100 mg Oral Daily   acetaminophen **OR** acetaminophen, albuterol, loratadine  Assessment/ Plan:  50 y.o. 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  1.  Chronic kidney disease stage IV, multifactorial with contributions from multiple episodes of acute renal failure previously/medullary calcinosis.  Baseline EGFR 27 in November.  Renal function quite low now.  Suspect due to dehydration from pancreatitis.  Continue IV fluid hydration.  May need to consider renal placement therapy if renal function worsens.  2.  Hyponatremia.  Serum sodium up to 128.  Continue hydration with sodium bicarbonate drip.  3.  Hypokalemia.  Serum potassium up to 2.3.  Being given potassium chloride.  May need to give additional intravenous potassium chloride.  4.  Metabolic acidosis.  Serum bicarbonate quite low at 9 upon admission.  Continue sodium bicarbonate drip.  5.  Pancreatitis.  Likely possible to recent alcohol use.  Consider gastroenterology consultation.   LOS: 1 Katherine Perez 1/2/20223:05 PM

## 2020-04-24 NOTE — Progress Notes (Signed)
[place] at [place] NAME: Katherine Perez    MR#:  [id number]  DATE OF BIRTH:  01/25/1971  SUBJECTIVE:   Patient has multiple medical problems for which she follows with PCP as outpatient. She came in with epigastric abdominal pain. States her pain today better however is asking when she can eat soup. No vomiting. Has some nausea.  No family at bedside REVIEW OF SYSTEMS:   Review of Systems  Constitutional: Negative for chills, fever and weight loss.  HENT: Negative for ear discharge, ear pain and nosebleeds.   Eyes: Negative for blurred vision, pain and discharge.  Respiratory: Negative for sputum production, shortness of breath, wheezing and stridor.   Cardiovascular: Negative for chest pain, palpitations, orthopnea and PND.  Gastrointestinal: Positive for abdominal pain and nausea. Negative for diarrhea and vomiting.  Genitourinary: Negative for frequency and urgency.  Musculoskeletal: Negative for back pain.  Neurological: Positive for weakness. Negative for sensory change, speech change and focal weakness.  Psychiatric/Behavioral: Negative for depression and hallucinations. The patient is not nervous/anxious.    Tolerating Diet:CLD Tolerating PT: uses walker at home  DRUG ALLERGIES:   Allergies  Allergen Reactions  . Cetirizine Hives  . Diazepam Other (See Comments)    Depression   . Baclofen Anxiety and Other (See Comments)    AMS - "Really messed me up, caused me to drop things"     VITALS:  Blood pressure (!) 101/58, pulse 70, temperature 97.7 F (36.5 C), resp. rate 16, weight 52.2 kg, SpO2 100 %.  PHYSICAL EXAMINATION:   Physical Exam  GENERAL:  50 y.o.-year-old patient lying in the bed with no acute distress. Looks older than age 50: Head atraumatic, normocephalic. Oropharynx and nasopharynx clear.  LUNGS: Normal breath sounds bilaterally, no wheezing, rales, rhonchi. No use of accessory muscles of respiration.   CARDIOVASCULAR: S1, S2 normal. No murmurs, rubs, or gallops.  ABDOMEN: Soft, + tenderness epigastric area, nondistended. Bowel sounds present. No organomegaly or mass.  EXTREMITIES: No cyanosis, clubbing or edema b/l.    NEUROLOGIC: grossly non focal PSYCHIATRIC:  patient is alert and oriented x2 SKIN: No obvious rash, lesion, or ulcer.   LABORATORY PANEL:  CBC Recent Labs  Lab 04/23/20 0430  WBC 14.0*  HGB 11.4*  HCT 31.3*  PLT 353    Chemistries  Recent Labs  Lab 04/23/20 0020 04/23/20 0430 04/24/20 0117 04/24/20 0921  NA  --    < > 131* 130*  K  --    < > 2.5* 2.8*  CL  --    < > 105 102  CO2  --    < > 10* 13*  GLUCOSE  --    < > 102* 98  BUN  --    < > 81* 80*  CREATININE  --    < > 4.15* 4.12*  CALCIUM  --    < > 8.4* 8.2*  MG 2.7*   < > 2.0  --   AST 20  --   --   --   ALT 18  --   --   --   ALKPHOS 104  --   --   --   BILITOT 0.8  --   --   --    < > = values in this interval not displayed.   Cardiac Enzymes No results for input(s): TROPONINI in the last 168 hours. RADIOLOGY:  CT Abdomen Pelvis Wo Contrast  Result Date: 04/23/2020 CLINICAL  DATA:  Altered mental status EXAM: CT HEAD WITHOUT CONTRAST CT ABDOMEN AND PELVIS WITHOUT CONTRAST TECHNIQUE: Contiguous axial images were obtained from the base of the skull through the vertex without intravenous contrast. Multidetector CT imaging of the abdomen and pelvis was performed following the standard protocol without IV contrast. COMPARISON:  CT head 10/26/2019, CT abdomen pelvis 10/28/2019 FINDINGS: CT HEAD FINDINGS Brain: Normal anatomic configuration. No abnormal intra or extra-axial mass lesion or fluid collection. No abnormal mass effect or midline shift. No evidence of acute intracranial hemorrhage or infarct. Ventricular size is normal. Cerebellum unremarkable. Vascular: Unremarkable Skull: Intact Sinuses/Orbits: Paranasal sinuses are clear. Orbits are unremarkable. Other: Mastoid air cells and middle ear  cavities are clear. CT ABDOMEN AND PELVIS FINDINGS Lower chest: Previously noted bibasilar pulmonary infiltrates have resolved. The visualized lung bases are clear. Visualized heart and pericardium are unremarkable. Hepatobiliary: The gallbladder is distended, similar to that noted on prior examination, without pericholecystic inflammatory change identified. The liver is unremarkable. No intra or extrahepatic biliary ductal dilation identified. Pancreas: There is extensive fluid seen tracking within the greater omentum, within the lesser sac surrounding the entire pancreas, and tracking into the small bowel mesentery. The distribution of inflammatory change favors changes of acute pancreatitis. Parenchymal viability is not well assessed on this noncontrast examination. The pancreatic duct is not dilated. No pancreatic parenchymal calcifications are seen. There is circumferential thickening of the second portion of the duodenum possibly related to the adjacent inflammatory process. No loculated peripancreatic fluid collections are identified. Spleen: Unremarkable Adrenals/Urinary Tract: The adrenal glands are unremarkable. Fine calcifications are seen within the kidneys bilaterally in keeping with changes of medullary nephrocalcinosis. Mild asymmetric cortical atrophy of the right kidney. No hydronephrosis. Bladder unremarkable. Stomach/Bowel: Stomach is within normal limits. Appendix appears normal. No evidence of bowel wall thickening, distention, or inflammatory changes. No free intraperitoneal gas or fluid. Vascular/Lymphatic: Extensive aortoiliac atherosclerotic calcification. No aortic aneurysm. No pathologic adenopathy within the abdomen and pelvis. Reproductive: Status post hysterectomy. No adnexal masses. Other: No abdominal wall hernia. Musculoskeletal: No lytic or blastic bone lesions are seen. IMPRESSION: Extensive peripancreatic inflammatory change with fluid seen tracking into the mesentery and greater  omentum in keeping with changes of acute pancreatitis. Pancreatic viability is not well assessed on this examination. No peripancreatic fluid collections identified. Medullary nephrocalcinosis. Mild asymmetric cortical atrophy of the right kidney, new since prior examination. Persistent distension of the gallbladder without superimposed pericholecystic inflammatory change possibly related to fasting state. Peripheral vascular disease. Aortic Atherosclerosis (ICD10-I70.0). Electronically Signed   By: Fidela Salisbury MD   On: 04/23/2020 00:03   DG Chest 1 View  Result Date: 04/23/2020 CLINICAL DATA:  Pneumonia EXAM: CHEST  1 VIEW COMPARISON:  04/22/2020 FINDINGS: The heart size and mediastinal contours are within normal limits. Both lungs are clear. The visualized skeletal structures are unremarkable. IMPRESSION: No active disease. Electronically Signed   By: Fidela Salisbury MD   On: 04/23/2020 03:15   CT Head Wo Contrast  Result Date: 04/23/2020 CLINICAL DATA:  Altered mental status EXAM: CT HEAD WITHOUT CONTRAST CT ABDOMEN AND PELVIS WITHOUT CONTRAST TECHNIQUE: Contiguous axial images were obtained from the base of the skull through the vertex without intravenous contrast. Multidetector CT imaging of the abdomen and pelvis was performed following the standard protocol without IV contrast. COMPARISON:  CT head 10/26/2019, CT abdomen pelvis 10/28/2019 FINDINGS: CT HEAD FINDINGS Brain: Normal anatomic configuration. No abnormal intra or extra-axial mass lesion or fluid collection. No abnormal mass effect or midline  shift. No evidence of acute intracranial hemorrhage or infarct. Ventricular size is normal. Cerebellum unremarkable. Vascular: Unremarkable Skull: Intact Sinuses/Orbits: Paranasal sinuses are clear. Orbits are unremarkable. Other: Mastoid air cells and middle ear cavities are clear. CT ABDOMEN AND PELVIS FINDINGS Lower chest: Previously noted bibasilar pulmonary infiltrates have resolved. The visualized  lung bases are clear. Visualized heart and pericardium are unremarkable. Hepatobiliary: The gallbladder is distended, similar to that noted on prior examination, without pericholecystic inflammatory change identified. The liver is unremarkable. No intra or extrahepatic biliary ductal dilation identified. Pancreas: There is extensive fluid seen tracking within the greater omentum, within the lesser sac surrounding the entire pancreas, and tracking into the small bowel mesentery. The distribution of inflammatory change favors changes of acute pancreatitis. Parenchymal viability is not well assessed on this noncontrast examination. The pancreatic duct is not dilated. No pancreatic parenchymal calcifications are seen. There is circumferential thickening of the second portion of the duodenum possibly related to the adjacent inflammatory process. No loculated peripancreatic fluid collections are identified. Spleen: Unremarkable Adrenals/Urinary Tract: The adrenal glands are unremarkable. Fine calcifications are seen within the kidneys bilaterally in keeping with changes of medullary nephrocalcinosis. Mild asymmetric cortical atrophy of the right kidney. No hydronephrosis. Bladder unremarkable. Stomach/Bowel: Stomach is within normal limits. Appendix appears normal. No evidence of bowel wall thickening, distention, or inflammatory changes. No free intraperitoneal gas or fluid. Vascular/Lymphatic: Extensive aortoiliac atherosclerotic calcification. No aortic aneurysm. No pathologic adenopathy within the abdomen and pelvis. Reproductive: Status post hysterectomy. No adnexal masses. Other: No abdominal wall hernia. Musculoskeletal: No lytic or blastic bone lesions are seen. IMPRESSION: Extensive peripancreatic inflammatory change with fluid seen tracking into the mesentery and greater omentum in keeping with changes of acute pancreatitis. Pancreatic viability is not well assessed on this examination. No peripancreatic fluid  collections identified. Medullary nephrocalcinosis. Mild asymmetric cortical atrophy of the right kidney, new since prior examination. Persistent distension of the gallbladder without superimposed pericholecystic inflammatory change possibly related to fasting state. Peripheral vascular disease. Aortic Atherosclerosis (ICD10-I70.0). Electronically Signed   By: Fidela Salisbury MD   On: 04/23/2020 00:03   DG Chest Port 1 View  Result Date: 04/22/2020 CLINICAL DATA:  Encephalopathy.  Altered mental status. EXAM: PORTABLE CHEST 1 VIEW COMPARISON:  10/26/2019 FINDINGS: Resolved bibasilar opacities from prior exam. The lungs are clear. The heart is normal in size with normal mediastinal contours. No pleural fluid or pneumothorax. No pulmonary edema. No acute osseous abnormalities are seen. IMPRESSION: Negative chest radiograph.  Resolved bibasilar opacities from prior. Electronically Signed   By: Keith Rake M.D.   On: 04/22/2020 23:53   US Abdomen Limited RUQ (LIVER/GB)  Result Date: 04/23/2020 CLINICAL DATA:  Abdominal pain EXAM: ULTRASOUND ABDOMEN LIMITED RIGHT UPPER QUADRANT COMPARISON:  None. FINDINGS: Gallbladder: Gallbladder is distended. No pericholecystic fluid or wall thickening. A negative sonographic Percell Miller sign was reported by the sonographer. No cholelithiasis. Common bile duct: Diameter: 7 mm Liver: No focal lesion identified. Within normal limits in parenchymal echogenicity. Portal vein is patent on color Doppler imaging with normal direction of blood flow towards the liver. Other: None. IMPRESSION: Distended gallbladder without cholelithiasis or other evidence of acute cholecystitis. Electronically Signed   By: Ulyses Jarred M.D.   On: 04/23/2020 04:44   ASSESSMENT AND PLAN:  50 year old female with past medical history of chronic kidney disease stage IV, multifactorial anemia (iron deficiency and anemia of chronic kidney disease), chronic interstitial cystitis, chronic constipation with  history of chronic laxative abuse, Raynaud's phenomenon and history  of multidrug-resistant urinary tract infections who presents to [facility] emergency department with lethargy and confusion. Patient has been experiencing epigastric pain for the past 3 to 4 days  Acute pancreatitis without infection or necrosis--new onset Etiology unclear--has distended GB on [place], ?ETOH related (very inconsistent with her h/o alcohol consumption) -Patient presenting with several days of epigastric pain, poor oral intake and intermittent vomiting with presentation complicated by acute kidney injury --Lipase found to be 1127---159 -- CT imaging the abdomen and pelvis consistent with acute pancreatitis without obvious infection or necrosis or pseudocyst.  --Keeping patient n.p.o. with exception of ice chips and occasional oral medications for now  --Hydrating patient with intravenous isotonic fluids  -- Lipid profile--TG 288--wwill place on rx for it  --Husband reports pt has not drank ETOH for >1 month--inconsistent history -S ETOH <10 -- consider GI consultation in am to discuss further w/u for GB distention --?HIDA -USG abd shows distended GB w/o inflammation or stones  AG metabolic acidodis in the setting of acute pancreatitis Acute renal failure superimposed on stage 4 chronic kidney disease ([place]) -Patient has acute kidney injury superimposed on chronic kidney disease --This is likely secondary to volume depletion poor oral intake over the past several days and occasional vomiting --Per review of outpatient nephrology notes, patient has had ongoing metabolic acidosis secondary to her renal disease even prior to this and has not been consistent with her sodium bicarbonate therapy due to GI intolerance --Hydrating patient with sodium bicarbonate infusion--Bicarb improving --Strict input and output monitoring -- nephrology consultation with Dr Holley Raring  --creat wornsening 4.15--4.12  (baseline 2.3-2.4) -out patient labs pts Bicarb is 12.3--15.8--non complaint with po bicarb as out pt    Acute metabolic encephalopathy--resolved -Patient presenting with several days of progressive lethargy and confusion -- patient more clear and conversely today.    Hyponatremia -Patient exhibiting substantial hyponatremia, likely secondary to volume depletion --Due to profoundly volume depleted state, patient has required several boluses in the emergency department administered by the emergency department staff -came in with Sodium of 126--127    Hypokalemia, inadequate intake - likely secondary to poor oral intake and intermittent nausea and vomiting as of late -- pharmacy to monitor electrolytes and replete --Magnesium is within normal limits  GERD without esophagitis ---Intravenous Protonix for now  Leukocytosis -Patient presenting with substantial leukocytosis --no source of infection noted -could be reactive from pancreatitis -15K--14K  Code Status:  Full code Family Communication: d/w husband at bedside on 1/1 DVT Prophylaxis :heparin  At baseline pt uses Walker at home. She has h/o bilateral meniscal tears--follows with [facility] pt just finished Home PT dec 1st.  Status is: Inpatient   Dispo: The patient is from: Home  Anticipated d/c is to: Home  Anticipated d/c date is: > 3 days  Patient currently is not medically stable to d/c. treatment for Acute pancreatitis, metabolic acidosis       TOTAL TIME TAKING CARE OF THIS PATIENT: *25* minutes.  >50% time spent on counselling and coordination of care  Note: This dictation was prepared with Dragon dictation along with smaller phrase technology. Any transcriptional errors that result from this process are unintentional.  Fritzi Mandes M.D    [facility]   CC: [facility]; [facility]Patient ID: Katherine Perez, female   DOB:  [birth year], 50 y.o.   MRN: [id number]

## 2020-04-24 NOTE — Progress Notes (Signed)
PHARMACY CONSULT NOTE - FOLLOW UP  Pharmacy Consult for Potassium Monitoring and Replacement   Recent Labs: Potassium (mmol/L)  Date Value  04/24/2020 3.0 (L)   Magnesium (mg/dL)  Date Value  04/24/2020 2.0   Calcium (mg/dL)  Date Value  04/24/2020 8.2 (L)   Albumin (g/dL)  Date Value  04/23/2020 3.4 (L)   Phosphorus (mg/dL)  Date Value  04/24/2020 4.2   Sodium (mmol/L)  Date Value  04/24/2020 130 (L)     Assessment: 50 yo female with past medical history of chronic kidney disease stage IV, multifactorial anemia (iron deficiency and anemia of chronic kidney disease), chronic interstitial cystitis, chronic constipation with history of chronic laxative abuse, Raynaud's phenomenon and history of multidrug-resistant urinary tract infections who presents to the ED with lethargy and confusion. Pharmacy has been consulted for replacement of potassium.   1/1 1900 K 2.2 1/2 0117 K 2.5 1/2 0921 K 2.8 1/2 1828 K 3.0  Goal of Therapy:  K WNL  Plan:  Will order KCL 61mEq PO times 1.  Pt remains on NS w 65meq KCL @ 36ml/hr  Monitor K daily and replace as needed  Paulina Fusi, PharmD, BCPS 04/24/2020 8:29 PM

## 2020-04-25 DIAGNOSIS — G9341 Metabolic encephalopathy: Secondary | ICD-10-CM | POA: Diagnosis not present

## 2020-04-25 DIAGNOSIS — N179 Acute kidney failure, unspecified: Secondary | ICD-10-CM | POA: Diagnosis not present

## 2020-04-25 DIAGNOSIS — K859 Acute pancreatitis without necrosis or infection, unspecified: Secondary | ICD-10-CM | POA: Diagnosis not present

## 2020-04-25 LAB — BASIC METABOLIC PANEL
Anion gap: 12 (ref 5–15)
BUN: 67 mg/dL — ABNORMAL HIGH (ref 6–20)
CO2: 15 mmol/L — ABNORMAL LOW (ref 22–32)
Calcium: 7.5 mg/dL — ABNORMAL LOW (ref 8.9–10.3)
Chloride: 102 mmol/L (ref 98–111)
Creatinine, Ser: 3.56 mg/dL — ABNORMAL HIGH (ref 0.44–1.00)
GFR, Estimated: 15 mL/min — ABNORMAL LOW (ref >60)
Glucose, Bld: 93 mg/dL (ref 70–99)
Potassium: 2.6 mmol/L — CL (ref 3.5–5.1)
Sodium: 129 mmol/L — ABNORMAL LOW (ref 135–145)

## 2020-04-25 LAB — POTASSIUM: Potassium: 3 mmol/L — ABNORMAL LOW (ref 3.5–5.1)

## 2020-04-25 LAB — MAGNESIUM: Magnesium: 1.6 mg/dL — ABNORMAL LOW (ref 1.7–2.4)

## 2020-04-25 MED ORDER — MENTHOL 3 MG MT LOZG
1.0000 | LOZENGE | OROMUCOSAL | Status: DC | PRN
  Filled 2020-04-25: qty 9

## 2020-04-25 MED ORDER — POTASSIUM CHLORIDE CRYS ER 20 MEQ PO TBCR
40.0000 meq | EXTENDED_RELEASE_TABLET | ORAL | Status: AC
  Administered 2020-04-25 (×2): 40 meq via ORAL
  Filled 2020-04-25 (×2): qty 2

## 2020-04-25 MED ORDER — POTASSIUM CHLORIDE CRYS ER 20 MEQ PO TBCR
40.0000 meq | EXTENDED_RELEASE_TABLET | Freq: Once | ORAL | Status: AC
  Administered 2020-04-25: 40 meq via ORAL
  Filled 2020-04-25: qty 2

## 2020-04-25 MED ORDER — POTASSIUM CHLORIDE 10 MEQ/100ML IV SOLN
10.0000 meq | INTRAVENOUS | Status: AC
  Administered 2020-04-25 (×4): 10 meq via INTRAVENOUS
  Filled 2020-04-25 (×4): qty 100

## 2020-04-25 MED ORDER — TRAZODONE HCL 50 MG PO TABS
25.0000 mg | ORAL_TABLET | Freq: Every evening | ORAL | Status: DC | PRN
  Administered 2020-04-25: 25 mg via ORAL
  Filled 2020-04-25: qty 1

## 2020-04-25 MED ORDER — MAGNESIUM SULFATE 2 GM/50ML IV SOLN
2.0000 g | Freq: Once | INTRAVENOUS | Status: AC
  Administered 2020-04-25: 2 g via INTRAVENOUS
  Filled 2020-04-25: qty 50

## 2020-04-25 NOTE — Progress Notes (Addendum)
[place] at [place] NAME: Jasmane Brockway    MR#:  [id number]  DATE OF BIRTH:  1970-07-09  SUBJECTIVE:   Patient has multiple medical problems for which she follows with PCP as outpatient. She came in with epigastric abdominal pain. States her pain today better  5/10 and wants to eat reg food No vomiting.   No family at bedside REVIEW OF SYSTEMS:   Review of Systems  Constitutional: Negative for chills, fever and weight loss.  HENT: Negative for ear discharge, ear pain and nosebleeds.   Eyes: Negative for blurred vision, pain and discharge.  Respiratory: Negative for sputum production, shortness of breath, wheezing and stridor.   Cardiovascular: Negative for chest pain, palpitations, orthopnea and PND.  Gastrointestinal: Positive for abdominal pain and nausea. Negative for diarrhea and vomiting.  Genitourinary: Negative for frequency and urgency.  Musculoskeletal: Negative for back pain.  Neurological: Positive for weakness. Negative for sensory change, speech change and focal weakness.  Psychiatric/Behavioral: Negative for depression and hallucinations. The patient is not nervous/anxious.    Tolerating Diet:heart healthy Tolerating PT: uses walker at home  DRUG ALLERGIES:   Allergies  Allergen Reactions  . Cetirizine Hives  . Diazepam Other (See Comments)    Depression   . Baclofen Anxiety and Other (See Comments)    AMS - "Really messed me up, caused me to drop things"     VITALS:  Blood pressure 120/75, pulse 86, temperature 98.2 F (36.8 C), resp. rate 16, height 5\' 9"  (1.753 m), weight 52.2 kg, SpO2 100 %.  PHYSICAL EXAMINATION:   Physical Exam  GENERAL:  50 y.o.-year-old patient lying in the bed with no acute distress. Looks older than age 31: Head atraumatic, normocephalic. Oropharynx and nasopharynx clear.  LUNGS: Normal breath sounds bilaterally, no wheezing, rales, rhonchi. No use of accessory muscles of  respiration.  CARDIOVASCULAR: S1, S2 normal. No murmurs, rubs, or gallops.  ABDOMEN: Soft, + tenderness epigastric area, nondistended. Bowel sounds present. No organomegaly or mass.  EXTREMITIES: No cyanosis, clubbing or edema b/l.    NEUROLOGIC: grossly non focal PSYCHIATRIC:  patient is alert and oriented x2 SKIN: No obvious rash, lesion, or ulcer.   LABORATORY PANEL:  CBC Recent Labs  Lab 04/23/20 0430  WBC 14.0*  HGB 11.4*  HCT 31.3*  PLT 353    Chemistries  Recent Labs  Lab 04/23/20 0020 04/23/20 0430 04/25/20 0424  NA  --    < > 129*  K  --    < > 2.6*  CL  --    < > 102  CO2  --    < > 15*  GLUCOSE  --    < > 93  BUN  --    < > 67*  CREATININE  --    < > 3.56*  CALCIUM  --    < > 7.5*  MG 2.7*   < > 1.6*  AST 20  --   --   ALT 18  --   --   ALKPHOS 104  --   --   BILITOT 0.8  --   --    < > = values in this interval not displayed.   Cardiac Enzymes No results for input(s): TROPONINI in the last 168 hours. RADIOLOGY:  No results found. ASSESSMENT AND PLAN:  50 year old female with past medical history of chronic kidney disease stage IV, multifactorial anemia (iron deficiency and anemia of chronic kidney disease), chronic interstitial cystitis,  chronic constipation with history of chronic laxative abuse, Raynaud's phenomenon and history of multidrug-resistant urinary tract infections who presents to [facility] emergency department with lethargy and confusion. Patient has been experiencing epigastric pain for the past 3 to 4 days  Acute pancreatitis without infection or necrosis--new onset Etiology unclear--has distended GB on [place], ?ETOH related (very inconsistent with her h/o alcohol consumption) -Patient presenting with several days of epigastric pain, poor oral intake and intermittent vomiting with presentation complicated by acute kidney injury --Lipase found to be 1127---159 -- CT imaging the abdomen and pelvis consistent with acute  pancreatitis without obvious infection or necrosis or pseudocyst.  --Keeping patient n.p.o. with exception of ice chips and occasional oral medications for now  --Hydrating patient with intravenous isotonic fluids  -- Lipid profile--TG 288--wwill place on rx for it  --Husband reports pt has not drank ETOH for >1 month--inconsistent history -S ETOH <10 --USG abd shows distended GB w/o inflammation or stones--curb sided GI--since pt is improving hold off any further imaging.  AG metabolic acidodis in the setting of acute pancreatitis Acute renal failure superimposed on stage 4 chronic kidney disease ([facility]) Hypokalemia/Hypomagnesimia --likely secondary to volume depletion poor oral intake over the past several days and occasional vomiting --Per review of outpatient nephrology notes, patient has had ongoing metabolic acidosis secondary to her renal disease even prior to this and has not been consistent with her sodium bicarbonate therapy due to GI intolerance --Hydrating patient with sodium bicarbonate infusion--Bicarb improving--now d/ced today 04/25/20 -- nephrology consultation with Dr lateef/Kolluru --creat wornsening 4.15--4.12--  (baseline 2.3-2.4)--3.56 -out patient labs pts Bicarb is 12.3--15.8--non complaint with po bicarb as out pt    Acute metabolic encephalopathy--resolved -Patient presenting with several days of progressive lethargy and confusion -- patient more clear and conversely today.    Hyponatremia--chronic -Patient exhibiting substantial hyponatremia, likely secondary to volume depletion --Due to profoundly volume depleted state, patient has required several boluses in the emergency department administered by the emergency department staff -came in with Sodium of 126--127--130--129    Hypokalemia, inadequate intake -- likely secondary to poor oral intake and intermittent nausea and vomiting as of late -- pharmacy to monitor electrolytes and replete --Magnesium is  within normal limits  GERD without esophagitis ---Intravenous Protonix for now  Leukocytosis -Patient presenting with substantial leukocytosis --no source of infection noted -could be reactive from pancreatitis -15K--14K  Code Status:  Full code Family Communication: d/w husband at bedside on 1/1 Unable to reach husband today  DVT Prophylaxis :heparin  At baseline pt uses Walker at home. She has h/o bilateral meniscal tears--follows with [facility] pt just finished Home PT dec 1st.  Status is: Inpatient   Dispo: The patient is from: Home  Anticipated d/c is to: Home  Anticipated d/c date is: > 3 days  Patient currently is not medically stable to d/c. treatment for Acute pancreatitis, metabolic acidosis       TOTAL TIME TAKING CARE OF THIS PATIENT: *25* minutes.  >50% time spent on counselling and coordination of care  Note: This dictation was prepared with Dragon dictation along with smaller phrase technology. Any transcriptional errors that result from this process are unintentional.  Fritzi Mandes M.D    [facility]   CC: [facility]; [facility]Patient ID: Patsey Berthold, female   DOB: May 25, 1970, 50 y.o.   MRN: [id number]

## 2020-04-25 NOTE — Progress Notes (Signed)
Patient asked for something to help with sleep.  Requested from on call provider Argie Ramming, MD via secure chat.

## 2020-04-25 NOTE — Progress Notes (Signed)
PHARMACY CONSULT NOTE - FOLLOW UP  Pharmacy Consult for Potassium Monitoring and Replacement   Recent Labs: Potassium (mmol/L)  Date Value  04/25/2020 3.0 (L)   Magnesium (mg/dL)  Date Value  04/25/2020 1.6 (L)   Calcium (mg/dL)  Date Value  04/25/2020 7.5 (L)   Albumin (g/dL)  Date Value  04/23/2020 3.4 (L)   Phosphorus (mg/dL)  Date Value  04/24/2020 4.2   Sodium (mmol/L)  Date Value  04/25/2020 129 (L)     Assessment: 50 yo female with past medical history of chronic kidney disease stage IV, multifactorial anemia (iron deficiency and anemia of chronic kidney disease), chronic interstitial cystitis, chronic constipation with history of chronic laxative abuse, Raynaud's phenomenon and history of multidrug-resistant urinary tract infections who presents to the ED with lethargy and confusion. Pharmacy has been consulted for replacement of potassium.   1/1 1900 K 2.2 1/2 0117 K 2.5 1/2 0921 K 2.8 1/2 1828 K 3.0 1/3 0424 K 2.6 1/3 0724 Mag 1.6 -- Mag 2 gram IV given 1/3 1747 K 3.0 following 80 mEq total replacement (40 mEq IV, 40 mEq PO)  Goal of Therapy:  K WNL  Plan:   Will order KCL 40 mEq PO x 1 to be given in addition to 40 mEq PO received just prior to lab draw  Pt remains on NS w 61meq KCL @ 64ml/hr  Recheck K and Mag with AM labs   Dorothe Pea, PharmD, BCPS Clinical Pharmacist 04/25/2020 6:31 PM

## 2020-04-25 NOTE — Progress Notes (Signed)
[facility]  ROUNDING NOTE   Subjective:   Patient diet advanced. States her abdominal pain has improved.   On bicarb gtt at 82mL/hr   Objective:  Vital signs in last 24 hours:  Temp:  [97.6 F (36.4 C)-98.3 F (36.8 C)] 98.2 F (36.8 C) (01/03 0839) Pulse Rate:  [80-94] 86 (01/03 0532) Resp:  [16] 16 (01/02 2348) BP: (106-120)/(73-78) 120/75 (01/03 0839) SpO2:  [99 %-100 %] 100 % (01/03 0839) Weight:  [52.2 kg] 52.2 kg (01/02 1923)  Weight change:  Filed Weights   04/23/20 0631 04/24/20 1923  Weight: 52.2 kg 52.2 kg    Intake/Output: I/O last 3 completed shifts: In: 3753.3 [I.V.:2953.3; IV Piggyback:800] Out: 0    Intake/Output this shift:  No intake/output data recorded.  Physical Exam: General:  No acute distress  Head:  Normocephalic, atraumatic. Moist oral mucosal membranes  Eyes:  Anicteric  Neck:  Supple  Lungs:   Clear to auscultation, normal effort  Heart:  regular  Abdomen:   Mild epigastric tenderness    Extremities:  Trace peripheral edema.  Neurologic:  Awake, alert, following commands  Skin:  No acute skin rash  Access:  No hemodialysis access    Basic Metabolic Panel: Recent Labs  Lab 04/23/20 0020 04/23/20 0430 04/23/20 1116 04/23/20 1541 04/23/20 1859 04/24/20 0117 04/24/20 0921 04/24/20 1828 04/25/20 0424  NA  --  125* 127* 128*  --  131* 130*  --  129*  K  --  3.4* 2.1* 2.3* 2.2* 2.5* 2.8* 3.0* 2.6*  CL  --  102 102 105  --  105 102  --  102  CO2  --  8* 9* 9*  --  10* 13*  --  15*  GLUCOSE  --  114* 89 82  --  102* 98  --  93  BUN  --  71* 75* 77*  --  81* 80*  --  67*  CREATININE  --  3.92* 3.90* 4.02*  --  4.15* 4.12*  --  3.56*  CALCIUM  --  9.5 9.1 8.8*  --  8.4* 8.2*  --  7.5*  MG 2.7* 2.4  --   --   --  2.0  --   --  1.6*  PHOS  --   --   --   --   --  4.2  --   --   --     Liver Function Tests: Recent Labs  Lab 04/23/20 0020  AST 20  ALT 18  ALKPHOS 104  BILITOT 0.8  PROT 6.6  ALBUMIN 3.4*    Recent Labs  Lab 04/22/20 2141 04/24/20 0921  LIPASE 1,127* 159*   Recent Labs  Lab 04/22/20 2140  AMMONIA 12    CBC: Recent Labs  Lab 04/22/20 2141 04/23/20 0430  WBC 15.4* 14.0*  NEUTROABS  --  11.1*  HGB 14.2 11.4*  HCT 41.7 31.3*  MCV 94.3 90.7  PLT 438* 353    Cardiac Enzymes: No results for input(s): CKTOTAL, CKMB, CKMBINDEX, TROPONINI in the last 168 hours.  BNP: Invalid input(s): POCBNP  CBG: No results for input(s): GLUCAP in the last 168 hours.  Microbiology: Results for orders placed or performed during the hospital encounter of 04/22/20  Resp Panel by RT-PCR (Flu A&B, Covid) Nasopharyngeal Swab     Status: None   Collection Time: 04/23/20 12:20 AM   Specimen: Nasopharyngeal Swab; Nasopharyngeal(NP) swabs in vial transport medium  Result Value Ref Range Status   SARS  Coronavirus 2 by RT PCR NEGATIVE NEGATIVE Final    Comment: (NOTE) SARS-CoV-2 target nucleic acids are NOT DETECTED.  The SARS-CoV-2 RNA is generally detectable in upper respiratory specimens during the acute phase of infection. The lowest concentration of SARS-CoV-2 viral copies this assay can detect is 138 copies/mL. A negative result does not preclude SARS-Cov-2 infection and should not be used as the sole basis for treatment or other patient management decisions. A negative result may occur with  improper specimen collection/handling, submission of specimen other than nasopharyngeal swab, presence of viral mutation(s) within the areas targeted by this assay, and inadequate number of viral copies(<138 copies/mL). A negative result must be combined with clinical observations, patient history, and epidemiological information. The expected result is Negative.  Fact Sheet for Patients:  EntrepreneurPulse.com.au  Fact Sheet for Healthcare Providers:  IncredibleEmployment.be  This test is no t yet approved or cleared by the [place] FDA and   has been authorized for detection and/or diagnosis of SARS-CoV-2 by FDA under an Emergency Use Authorization (EUA). This EUA will remain  in effect (meaning this test can be used) for the duration of the COVID-19 declaration under Section 564(b)(1) of the Act, 21 U.S.C.section 360bbb-3(b)(1), unless the authorization is terminated  or revoked sooner.       Influenza A by PCR NEGATIVE NEGATIVE Final   Influenza B by PCR NEGATIVE NEGATIVE Final    Comment: (NOTE) The Xpert Xpress SARS-CoV-2/FLU/RSV plus assay is intended as an aid in the diagnosis of influenza from Nasopharyngeal swab specimens and should not be used as a sole basis for treatment. Nasal washings and aspirates are unacceptable for Xpert Xpress SARS-CoV-2/FLU/RSV testing.  Fact Sheet for Patients: EntrepreneurPulse.com.au  Fact Sheet for Healthcare Providers: IncredibleEmployment.be  This test is not yet approved or cleared by the [place] FDA and has been authorized for detection and/or diagnosis of SARS-CoV-2 by FDA under an Emergency Use Authorization (EUA). This EUA will remain in effect (meaning this test can be used) for the duration of the COVID-19 declaration under Section 564(b)(1) of the Act, 21 U.S.C. section 360bbb-3(b)(1), unless the authorization is terminated or revoked.  Performed at [facility], [address] [place], [place] [address]   Culture, blood (routine x 2)     Status: None (Preliminary result)   Collection Time: 04/23/20 12:20 AM   Specimen: BLOOD  Result Value Ref Range Status   Specimen Description BLOOD RIGHT ANTECUBITAL  Final   Special Requests   Final    BOTTLES DRAWN AEROBIC AND ANAEROBIC Blood Culture results may not be optimal due to an inadequate volume of blood received in culture bottles   Culture   Final    NO GROWTH 2 DAYS Performed at [facility], [address], [place], [place] [address]    Report  Status PENDING  Incomplete  Culture, blood (routine x 2)     Status: None (Preliminary result)   Collection Time: 04/23/20 12:20 AM   Specimen: BLOOD  Result Value Ref Range Status   Specimen Description BLOOD BLOOD RIGHT FOREARM  Final   Special Requests   Final    BOTTLES DRAWN AEROBIC AND ANAEROBIC Blood Culture adequate volume   Culture   Final    NO GROWTH 2 DAYS Performed at [facility], [address], [place],  [address]    Report Status PENDING  Incomplete    Coagulation Studies: No results for input(s): LABPROT, INR in the last 72 hours.  Urinalysis: No  results for input(s): COLORURINE, LABSPEC, [place], GLUCOSEU, HGBUR, BILIRUBINUR, KETONESUR, PROTEINUR, UROBILINOGEN, NITRITE, LEUKOCYTESUR in the last 72 hours.  Invalid input(s): APPERANCEUR    Imaging: No results found.   Medications:   . 0.9 % NaCl with KCl 20 mEq / L 50 mL/hr at 04/25/20 0616   . fluticasone  2 spray Each Nare BID  . fluticasone furoate-vilanterol  1 puff Inhalation Daily  . heparin injection (subcutaneous)  5,000 Units Subcutaneous Q8H  . mometasone-formoterol  2 puff Inhalation BID  . nicotine  21 mg Transdermal Daily  . pantoprazole (PROTONIX) IV  40 mg Intravenous Q24H  . potassium chloride  40 mEq Oral Q4H  . tamsulosin  0.4 mg Oral Daily  . theophylline  400 mg Oral BID  . thiamine  100 mg Oral Daily   acetaminophen **OR** acetaminophen, albuterol, loratadine, menthol-cetylpyridinium  Assessment/ Plan:   Ms.. Katherine Perez is a 50 y.o. 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  Acute kidney injury on Chronic kidney disease stage IV: baseline creatinine of 2, GFR of 27 on 03/29/20.  Followed by Dr. Anthonette Legato.  Acute kidney injury secondary to prerenal azotemia from hypovolemia and concurrent illness leading to ATN.   2. Hypokalemia with hypomagnesemia  3. Metabolic acidosis: on bicarb infusion  4. Hyponatremia: with hypovolemia  Plan  - Discontinue bicarb infusion - Replace potassium and magnesium   LOS: 2 Reinhart Saulters 1/3/20221:44 PM

## 2020-04-25 NOTE — Progress Notes (Addendum)
PHARMACY CONSULT NOTE - FOLLOW UP  Pharmacy Consult for Potassium Monitoring and Replacement   Recent Labs: Potassium (mmol/L)  Date Value  04/25/2020 2.6 (LL)   Magnesium (mg/dL)  Date Value  04/24/2020 2.0   Calcium (mg/dL)  Date Value  04/25/2020 7.5 (L)   Albumin (g/dL)  Date Value  04/23/2020 3.4 (L)   Phosphorus (mg/dL)  Date Value  04/24/2020 4.2   Sodium (mmol/L)  Date Value  04/25/2020 129 (L)     Assessment: 50 yo female with past medical history of chronic kidney disease stage IV, multifactorial anemia (iron deficiency and anemia of chronic kidney disease), chronic interstitial cystitis, chronic constipation with history of chronic laxative abuse, Raynaud's phenomenon and history of multidrug-resistant urinary tract infections who presents to the ED with lethargy and confusion. Pharmacy has been consulted for replacement of potassium.   1/1 1900 K 2.2 1/2 0117 K 2.5 1/2 0921 K 2.8 1/2 1828 K 3.0 1/3 0424 K 2.6  Goal of Therapy:  K WNL  Plan:  Physician ordered KCL IV 83meq x 4  Will order KCL 45mEq PO times 2 in addition   Pt remains on NS w 57meq KCL @ 69ml/hr  Monitor K today @ 1800 and replace again as needed  Lu Duffel, PharmD, BCPS Clinical Pharmacist 04/25/2020 8:46 AM  Addendum:  Add-on Mg showed Mg 1.6 - 2g IV bolus ordered

## 2020-04-26 LAB — BASIC METABOLIC PANEL
Anion gap: 8 (ref 5–15)
BUN: 57 mg/dL — ABNORMAL HIGH (ref 6–20)
CO2: 15 mmol/L — ABNORMAL LOW (ref 22–32)
Calcium: 8.1 mg/dL — ABNORMAL LOW (ref 8.9–10.3)
Chloride: 110 mmol/L (ref 98–111)
Creatinine, Ser: 2.95 mg/dL — ABNORMAL HIGH (ref 0.44–1.00)
GFR, Estimated: 19 mL/min — ABNORMAL LOW (ref >60)
Glucose, Bld: 89 mg/dL (ref 70–99)
Potassium: 4.2 mmol/L (ref 3.5–5.1)
Sodium: 133 mmol/L — ABNORMAL LOW (ref 135–145)

## 2020-04-26 LAB — MAGNESIUM: Magnesium: 2.2 mg/dL (ref 1.7–2.4)

## 2020-04-26 MED ORDER — POTASSIUM CHLORIDE CRYS ER 20 MEQ PO TBCR
40.0000 meq | EXTENDED_RELEASE_TABLET | Freq: Every day | ORAL | Status: DC
  Administered 2020-04-26: 40 meq via ORAL
  Filled 2020-04-26: qty 2

## 2020-04-26 MED ORDER — MAGNESIUM OXIDE 400 (241.3 MG) MG PO TABS: 400.0000 mg | ORAL_TABLET | Freq: Every day | ORAL | 2 refills | Status: DC

## 2020-04-26 MED ORDER — MAGNESIUM OXIDE 400 (241.3 MG) MG PO TABS
400.0000 mg | ORAL_TABLET | Freq: Every day | ORAL | Status: DC
  Administered 2020-04-26: 400 mg via ORAL
  Filled 2020-04-26: qty 1

## 2020-04-26 MED ORDER — GABAPENTIN 300 MG PO CAPS: 300.0000 mg | ORAL_CAPSULE | Freq: Three times a day (TID) | ORAL | 0 refills | Status: DC

## 2020-04-26 MED ORDER — POTASSIUM CHLORIDE CRYS ER 20 MEQ PO TBCR: 40.0000 meq | EXTENDED_RELEASE_TABLET | Freq: Every day | ORAL | 3 refills | Status: DC

## 2020-04-26 NOTE — Progress Notes (Signed)
[facility]  ROUNDING NOTE   Subjective:   Husband at bedside  Patient without any complaints.   K 4.2  Creatinine 2.95 (3.56)  Objective:  Vital signs in last 24 hours:  Temp:  [97.6 F (36.4 C)-98.7 F (37.1 C)] 98.6 F (37 C) (01/04 0855) Pulse Rate:  [77-97] 79 (01/04 0855) Resp:  [16-21] 16 (01/04 0855) BP: (112-138)/(68-91) 128/91 (01/04 0855) SpO2:  [99 %-100 %] 100 % (01/04 0855)  Weight change:  Filed Weights   04/23/20 0631 04/24/20 1923  Weight: 52.2 kg 52.2 kg    Intake/Output: I/O last 3 completed shifts: In: 2236.7 [I.V.:2236.7] Out: 0    Intake/Output this shift:  No intake/output data recorded.  Physical Exam: General:  No acute distress  Head:  Normocephalic, atraumatic. Moist oral mucosal membranes  Eyes:  Anicteric  Neck:  Supple  Lungs:   Clear to auscultation, normal effort  Heart:  regular  Abdomen:   Mild epigastric tenderness    Extremities:  Trace peripheral edema.  Neurologic:  Awake, alert, following commands  Skin:  No acute skin rash  Access:  No hemodialysis access    Basic Metabolic Panel: Recent Labs  Lab 04/23/20 0020 04/23/20 0430 04/23/20 1116 04/23/20 1541 04/23/20 1859 04/24/20 0117 04/24/20 0921 04/24/20 1828 04/25/20 0424 04/25/20 1747 04/26/20 0529  NA  --  125*   < > 128*  --  131* 130*  --  129*  --  133*  K  --  3.4*   < > 2.3*   < > 2.5* 2.8* 3.0* 2.6* 3.0* 4.2  CL  --  102   < > 105  --  105 102  --  102  --  110  CO2  --  8*   < > 9*  --  10* 13*  --  15*  --  15*  GLUCOSE  --  114*   < > 82  --  102* 98  --  93  --  89  BUN  --  71*   < > 77*  --  81* 80*  --  67*  --  57*  CREATININE  --  3.92*   < > 4.02*  --  4.15* 4.12*  --  3.56*  --  2.95*  CALCIUM  --  9.5   < > 8.8*  --  8.4* 8.2*  --  7.5*  --  8.1*  MG 2.7* 2.4  --   --   --  2.0  --   --  1.6*  --  2.2  PHOS  --   --   --   --   --  4.2  --   --   --   --   --    < > = values in this interval not displayed.    Liver  Function Tests: Recent Labs  Lab 04/23/20 0020  AST 20  ALT 18  ALKPHOS 104  BILITOT 0.8  PROT 6.6  ALBUMIN 3.4*   Recent Labs  Lab 04/22/20 2141 04/24/20 0921  LIPASE 1,127* 159*   Recent Labs  Lab 04/22/20 2140  AMMONIA 12    CBC: Recent Labs  Lab 04/22/20 2141 04/23/20 0430  WBC 15.4* 14.0*  NEUTROABS  --  11.1*  HGB 14.2 11.4*  HCT 41.7 31.3*  MCV 94.3 90.7  PLT 438* 353    Cardiac Enzymes: No results for input(s): CKTOTAL, CKMB, CKMBINDEX, TROPONINI in the last 168 hours.  BNP: Invalid  input(s): POCBNP  CBG: No results for input(s): GLUCAP in the last 168 hours.  Microbiology: Results for orders placed or performed during the hospital encounter of 04/22/20  Resp Panel by RT-PCR (Flu A&B, Covid) Nasopharyngeal Swab     Status: None   Collection Time: 04/23/20 12:20 AM   Specimen: Nasopharyngeal Swab; Nasopharyngeal(NP) swabs in vial transport medium  Result Value Ref Range Status   SARS Coronavirus 2 by RT PCR NEGATIVE NEGATIVE Final    Comment: (NOTE) SARS-CoV-2 target nucleic acids are NOT DETECTED.  The SARS-CoV-2 RNA is generally detectable in upper respiratory specimens during the acute phase of infection. The lowest concentration of SARS-CoV-2 viral copies this assay can detect is 138 copies/mL. A negative result does not preclude SARS-Cov-2 infection and should not be used as the sole basis for treatment or other patient management decisions. A negative result may occur with  improper specimen collection/handling, submission of specimen other than nasopharyngeal swab, presence of viral mutation(s) within the areas targeted by this assay, and inadequate number of viral copies(<138 copies/mL). A negative result must be combined with clinical observations, patient history, and epidemiological information. The expected result is Negative.  Fact Sheet for Patients:  EntrepreneurPulse.com.au  Fact Sheet for Healthcare  Providers:  IncredibleEmployment.be  This test is no t yet approved or cleared by the [place] FDA and  has been authorized for detection and/or diagnosis of SARS-CoV-2 by FDA under an Emergency Use Authorization (EUA). This EUA will remain  in effect (meaning this test can be used) for the duration of the COVID-19 declaration under Section 564(b)(1) of the Act, 21 U.S.C.section 360bbb-3(b)(1), unless the authorization is terminated  or revoked sooner.       Influenza A by PCR NEGATIVE NEGATIVE Final   Influenza B by PCR NEGATIVE NEGATIVE Final    Comment: (NOTE) The Xpert Xpress SARS-CoV-2/FLU/RSV plus assay is intended as an aid in the diagnosis of influenza from Nasopharyngeal swab specimens and should not be used as a sole basis for treatment. Nasal washings and aspirates are unacceptable for Xpert Xpress SARS-CoV-2/FLU/RSV testing.  Fact Sheet for Patients: EntrepreneurPulse.com.au  Fact Sheet for Healthcare Providers: IncredibleEmployment.be  This test is not yet approved or cleared by the [place] FDA and has been authorized for detection and/or diagnosis of SARS-CoV-2 by FDA under an Emergency Use Authorization (EUA). This EUA will remain in effect (meaning this test can be used) for the duration of the COVID-19 declaration under Section 564(b)(1) of the Act, 21 U.S.C. section 360bbb-3(b)(1), unless the authorization is terminated or revoked.  Performed at [facility], [address] [place], [place] [address]   Culture, blood (routine x 2)     Status: None (Preliminary result)   Collection Time: 04/23/20 12:20 AM   Specimen: BLOOD  Result Value Ref Range Status   Specimen Description BLOOD RIGHT ANTECUBITAL  Final   Special Requests   Final    BOTTLES DRAWN AEROBIC AND ANAEROBIC Blood Culture results may not be optimal due to an inadequate volume of blood received in culture bottles    Culture   Final    NO GROWTH 3 DAYS Performed at [facility], [address] [place], [place] [address]    Report Status PENDING  Incomplete  Culture, blood (routine x 2)     Status: None (Preliminary result)   Collection Time: 04/23/20 12:20 AM   Specimen: BLOOD  Result Value Ref Range Status   Specimen Description BLOOD BLOOD RIGHT FOREARM  Final  Special Requests   Final    BOTTLES DRAWN AEROBIC AND ANAEROBIC Blood Culture adequate volume   Culture   Final    NO GROWTH 3 DAYS Performed at [facility], [address] [place], [place] [address]    Report Status PENDING  Incomplete    Coagulation Studies: No results for input(s): LABPROT, INR in the last 72 hours.  Urinalysis: No results for input(s): COLORURINE, LABSPEC, PHURINE, GLUCOSEU, HGBUR, BILIRUBINUR, KETONESUR, PROTEINUR, UROBILINOGEN, NITRITE, LEUKOCYTESUR in the last 72 hours.  Invalid input(s): APPERANCEUR    Imaging: No results found.   Medications:    . fluticasone  2 spray Each Nare BID  . heparin injection (subcutaneous)  5,000 Units Subcutaneous Q8H  . magnesium oxide  400 mg Oral Daily  . mometasone-formoterol  2 puff Inhalation BID  . nicotine  21 mg Transdermal Daily  . pantoprazole (PROTONIX) IV  40 mg Intravenous Q24H  . potassium chloride  40 mEq Oral Daily  . tamsulosin  0.4 mg Oral Daily  . theophylline  400 mg Oral BID  . thiamine  100 mg Oral Daily   acetaminophen **OR** acetaminophen, albuterol, loratadine, menthol-cetylpyridinium, traZODone  Assessment/ Plan:   Ms.. Katherine Perez is a 50 y.o. 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  Acute kidney injury on Chronic kidney disease  stage IV: baseline creatinine of 2, GFR of 27 on 03/29/20. Followed by Dr. Anthonette Legato.  Acute kidney injury secondary to prerenal azotemia from hypovolemia and concurrent illness leading to ATN.   2. Hypokalemia with hypomagnesemia  3. Metabolic acidosis: status post bicarb infusion  4. Hyponatremia: with hypovolemia  Plan  - Potassium chloride 71mEq PO daily - Magnesium oxide 400mg  PO daily  Follow up with Dr. Holley Raring.    LOS: 3 Katherine Perez 1/4/202212:32 PM

## 2020-04-26 NOTE — Progress Notes (Signed)
Discharge order received. Patient mental status is at baseline. Vital signs stable . No signs of acute distress. Discharge instructions given. Patient verbalized understanding. No other issues noted at this time.   

## 2020-04-26 NOTE — Discharge Summary (Signed)
[facility] NAME: Katherine Perez    MR#:  [id number]  DATE OF BIRTH:  Nov 15, 1970  DATE OF ADMISSION:  04/22/2020 ADMITTING PHYSICIAN: Vernelle Emerald, MD   DATE OF DISCHARGE: 04/26/2020  PRIMARY CARE PHYSICIAN: [place]    ADMISSION DIAGNOSIS:  Hypercalcemia [E83.52] Acute pancreatitis [K85.90] Hypokalemia [E87.6] Pneumonia [J18.9] Uremic encephalopathy [G93.49, N19] AKI (acute kidney injury) ([place]) [N17.9] Altered mental status, unspecified altered mental status type [R41.82] Acute pancreatitis, unspecified complication status, unspecified pancreatitis type [K85.90]  DISCHARGE DIAGNOSIS:  acute pancreatitis anion gap metabolic acidosis with acute renal failure superimposed on stage IV chronic kidney disease electrolyte abnormality-- corrected acute metabolic encephalopathy resolved SECONDARY DIAGNOSIS:   Past Medical History:  Diagnosis Date  . Anemia   . CKD (chronic kidney disease)   . ETOH abuse   . GERD (gastroesophageal reflux disease)   . IC (interstitial cystitis)   . Laxative abuse   . Neuropathy   . Raynaud disease     HOSPITAL COURSE:   50 year old female with past medical history of chronic kidney disease stage IV, multifactorial anemia(iron deficiency and anemia of chronic kidney disease), chronic interstitial cystitis, chronic constipation with history of chronic laxative abuse, Raynaud's phenomenon and history of multidrug-resistant urinary tract infections who presents to [facility] emergency department with lethargy and confusion. Patient has been experiencing epigastric pain for the past 3 to 4 days  Acute pancreatitis without infection or necrosis--new onset Etiology unclear--has distended GB on [place], ?ETOH related (very inconsistent with her h/o alcohol consumption) -Patient presenting with several days of epigastric pain, poor oral intake and intermittent vomiting  with presentation complicated by acute kidney injury --Lipase found to be 1127---159 -- CT imaging the abdomen and pelvis consistent with acute pancreatitis without obvious infection or necrosis or pseudocyst.   -- Received aggressive intravenous isotonic fluids  -- Lipid profile--TG 288 --Husband reports pt has not drank ETOH for >1 month--inconsistent history -S ETOH <10 --USG abd shows distended GB w/o inflammation or stones--curb sided GI--since pt is improving hold off any further imaging.  AG metabolic acidodis in the setting of acute pancreatitis Acute renal failure superimposed on stage 4 chronic kidney disease (HCC) Hypokalemia/Hypomagnesimia --likely secondary to volume depletion poor oral intake over the past several days and occasional vomiting --Per review of outpatient nephrology notes, patient has had ongoing metabolic acidosis secondary to her renal disease even prior to this and has not been consistent with her sodium bicarbonate therapy due to GI intolerance --Hydrating patient with sodium bicarbonate infusion--Bicarb improving--now d/ced today 04/25/20 -- nephrology consultation with Dr lateef/Kolluru --creat wornsening 4.15--4.12--  (baseline 2.3-2.4)--3.56-- 2.95 -out patient labs pts Bicarb is 12.3--15.8--ntolerant with po bicarb as out pt  Acute metabolic encephalopathy--resolved -Patient presenting with several days of progressive lethargy and confusion -- patient awake alert back to baseline  Hyponatremia--chronic -Patient exhibiting substantial hyponatremia, likely secondary to volume depletion --Due to profoundly volume depleted state, patient has required several boluses in the emergency department administered by the emergency department staff -came in with Sodium of 126--127--130--129--133  Hypokalemia, inadequate intake -- likely secondary to poor oral intake and intermittent nausea and vomiting as of late -- pharmacy to monitor electrolytes  and replete --Magnesium is within normal limits -- potassium 4.2. Magnesium 2.2. Patient will get PO potassium and magnesium replacement as outpatient  GERD without esophagitis --- continue PPI  Leukocytosis --no source of infection noted -could be reactive from pancreatitis -15K--14K  Code  Status:Full code Family Communication:d/w husband at bedside on 1/5  DVT Prophylaxis :heparin  At baseline pt uses [facility] at home. She has h/o bilateral meniscal tears--follows with [facility] pt just finished Home PT dec 1st.  Status is: Inpatient   Dispo: The patient is from:Home Anticipated d/c is GG:YIRS Anticipated d/c date is: today Patient currently is  medically best at baseline to d/c. Discharge plan with patient and husband.  CONSULTS OBTAINED:  Treatment Team:  Anthonette Legato, MD  DRUG ALLERGIES:   Allergies  Allergen Reactions  . Cetirizine Hives  . Diazepam Other (See Comments)    Depression   . Baclofen Anxiety and Other (See Comments)    AMS - "Really messed me up, caused me to drop things"     DISCHARGE MEDICATIONS:   Allergies as of 04/26/2020      Reactions   Cetirizine Hives   Diazepam Other (See Comments)   Depression   Baclofen Anxiety, Other (See Comments)   AMS - "Really messed me up, caused me to drop things"      Medication List    STOP taking these medications   Fiber 625 MG Tabs   omeprazole 40 MG capsule Commonly known as: PRILOSEC     TAKE these medications   albuterol 108 (90 Base) MCG/ACT inhaler Commonly known as: VENTOLIN HFA Inhale 2 puffs into the lungs every 4 (four) hours as needed.   Diclofenac-miSOPROStol 75-0.2 MG Tbec Take 1 tablet by mouth 2 (two) times daily.   famotidine 40 MG tablet Commonly known as: PEPCID Take 40 mg by mouth daily.   fexofenadine 180 MG tablet Commonly known as: ALLEGRA Take 180 mg by mouth daily as needed for allergies.    fluticasone 50 MCG/ACT nasal spray Commonly known as: FLONASE Place 2 sprays into both nostrils 2 (two) times daily.   Fluticasone-Salmeterol 100-50 MCG/DOSE Aepb Commonly known as: ADVAIR Inhale 1 puff into the lungs 2 (two) times daily.   Fluticasone-Salmeterol 250-50 MCG/DOSE Aepb Commonly known as: ADVAIR Inhale 1 puff into the lungs 2 (two) times daily.   gabapentin 300 MG capsule Commonly known as: NEURONTIN Take 1 capsule (300 mg total) by mouth 3 (three) times daily. What changed:  when to take this reasons to take this   magnesium oxide 400 (241.3 Mg) MG tablet Commonly known as: MAG-OX Take 1 tablet (400 mg total) by mouth daily. Start taking on: April 27, 2020   metoCLOPramide 10 MG tablet Commonly known as: REGLAN Take 20 mg by mouth in the morning and at bedtime.   ondansetron 8 MG tablet Commonly known as: ZOFRAN Take 16 mg by mouth 2 (two) times daily.   oxybutynin 10 MG 24 hr tablet Commonly known as: DITROPAN-XL Take 10 mg by mouth daily.   potassium chloride SA 20 MEQ tablet Commonly known as: KLOR-CON Take 2 tablets (40 mEq total) by mouth daily. Start taking on: April 27, 2020   promethazine 25 MG tablet Commonly known as: PHENERGAN Take 25 mg by mouth every 8 (eight) hours as needed for nausea or vomiting.   RABEprazole 20 MG tablet Commonly known as: ACIPHEX Take 20 mg by mouth in the morning and at bedtime.   tamsulosin 0.4 MG Caps capsule Commonly known as: FLOMAX Take 0.4 mg by mouth daily.   theophylline 400 MG 24 hr tablet Commonly known as: UNIPHYL Take 400 mg by mouth 2 (two) times daily.   thiamine 100 MG tablet Take 1 tablet (100 mg total) by mouth daily.  trimethoprim 100 MG tablet Commonly known as: TRIMPEX Take 100 mg by mouth daily.   Trulance 3 MG Tabs Generic drug: Plecanatide Take 3 mg by mouth daily at 6 (six) AM.       If you experience worsening of your admission symptoms, develop shortness of breath,  life threatening emergency, suicidal or homicidal thoughts you must seek medical attention immediately by calling 911 or calling your MD immediately  if symptoms less severe.  You Must read complete instructions/literature along with all the possible adverse reactions/side effects for all the Medicines you take and that have been prescribed to you. Take any new Medicines after you have completely understood and accept all the possible adverse reactions/side effects.   Please note  You were cared for by a hospitalist during your hospital stay. If you have any questions about your discharge medications or the care you received while you were in the hospital after you are discharged, you can call the unit and asked to speak with the hospitalist on call if the hospitalist that took care of you is not available. Once you are discharged, your primary care physician will handle any further medical issues. Please note that NO REFILLS for any discharge medications will be authorized once you are discharged, as it is imperative that you return to your primary care physician (or establish a relationship with a primary care physician if you do not have one) for your aftercare needs so that they can reassess your need for medications and monitor your lab values. Today   SUBJECTIVE   Desperate to go home. Husband in the room  VITAL SIGNS:  Blood pressure (!) 128/91, pulse 79, temperature 98.6 F (37 C), temperature source Oral, resp. rate 16, height 5\' 9"  (1.753 m), weight 52.2 kg, SpO2 100 %.  I/O:    Intake/Output Summary (Last 24 hours) at 04/26/2020 1602 Last data filed at 04/26/2020 0329 Gross per 24 hour  Intake 1219.17 ml  Output --  Net 1219.17 ml    PHYSICAL EXAMINATION:  GENERAL:  50 y.o.-year-old patient lying in the bed with no acute distress.  EYES: Pupils equal, round, reactive to light and accommodation. +scleral icterus.  HEENT: Head atraumatic, normocephalic. Oropharynx and nasopharynx  clear.  LUNGS: Normal breath sounds bilaterally, no wheezing, rales,rhonchi or crepitation. No use of accessory muscles of respiration.  CARDIOVASCULAR: S1, S2 normal. No murmurs, rubs, or gallops.  ABDOMEN: Soft, non-tender, non-distended. Bowel sounds present. No organomegaly or mass.  EXTREMITIES: No pedal edema, cyanosis, or clubbing.  NEUROLOGIC: Cranial nerves II through XII are intact. Muscle strength 4+/5 in all extremities. Sensation intact. Gait not checked.  PSYCHIATRIC:  patient is alert and awake SKIN: No obvious rash, lesion, or ulcer.   DATA REVIEW:   CBC  Recent Labs  Lab 04/23/20 0430  WBC 14.0*  HGB 11.4*  HCT 31.3*  PLT 353    Chemistries  Recent Labs  Lab 04/23/20 0020 04/23/20 0430 04/26/20 0529  NA  --    < > 133*  K  --    < > 4.2  CL  --    < > 110  CO2  --    < > 15*  GLUCOSE  --    < > 89  BUN  --    < > 57*  CREATININE  --    < > 2.95*  CALCIUM  --    < > 8.1*  MG 2.7*   < > 2.2  AST 20  --   --  ALT 18  --   --   ALKPHOS 104  --   --   BILITOT 0.8  --   --    < > = values in this interval not displayed.    Microbiology Results   Recent Results (from the past 240 hour(s))  Resp Panel by RT-PCR (Flu A&B, Covid) Nasopharyngeal Swab     Status: None   Collection Time: 04/23/20 12:20 AM   Specimen: Nasopharyngeal Swab; Nasopharyngeal(NP) swabs in vial transport medium  Result Value Ref Range Status   SARS Coronavirus 2 by RT PCR NEGATIVE NEGATIVE Final    Comment: (NOTE) SARS-CoV-2 target nucleic acids are NOT DETECTED.  The SARS-CoV-2 RNA is generally detectable in upper respiratory specimens during the acute phase of infection. The lowest concentration of SARS-CoV-2 viral copies this assay can detect is 138 copies/mL. A negative result does not preclude SARS-Cov-2 infection and should not be used as the sole basis for treatment or other patient management decisions. A negative result may occur with  improper specimen  collection/handling, submission of specimen other than nasopharyngeal swab, presence of viral mutation(s) within the areas targeted by this assay, and inadequate number of viral copies(<138 copies/mL). A negative result must be combined with clinical observations, patient history, and epidemiological information. The expected result is Negative.  Fact Sheet for Patients:  EntrepreneurPulse.com.au  Fact Sheet for Healthcare Providers:  IncredibleEmployment.be  This test is no t yet approved or cleared by the [place] FDA and  has been authorized for detection and/or diagnosis of SARS-CoV-2 by FDA under an Emergency Use Authorization (EUA). This EUA will remain  in effect (meaning this test can be used) for the duration of the COVID-19 declaration under Section 564(b)(1) of the Act, 21 U.S.C.section 360bbb-3(b)(1), unless the authorization is terminated  or revoked sooner.       Influenza A by PCR NEGATIVE NEGATIVE Final   Influenza B by PCR NEGATIVE NEGATIVE Final    Comment: (NOTE) The Xpert Xpress SARS-CoV-2/FLU/RSV plus assay is intended as an aid in the diagnosis of influenza from Nasopharyngeal swab specimens and should not be used as a sole basis for treatment. Nasal washings and aspirates are unacceptable for Xpert Xpress SARS-CoV-2/FLU/RSV testing.  Fact Sheet for Patients: EntrepreneurPulse.com.au  Fact Sheet for Healthcare Providers: IncredibleEmployment.be  This test is not yet approved or cleared by the [place] FDA and has been authorized for detection and/or diagnosis of SARS-CoV-2 by FDA under an Emergency Use Authorization (EUA). This EUA will remain in effect (meaning this test can be used) for the duration of the COVID-19 declaration under Section 564(b)(1) of the Act, 21 U.S.C. section 360bbb-3(b)(1), unless the authorization is terminated or revoked.  Performed at [facility], [address] [place], [place] [address]   Culture, blood (routine x 2)     Status: None (Preliminary result)   Collection Time: 04/23/20 12:20 AM   Specimen: BLOOD  Result Value Ref Range Status   Specimen Description BLOOD RIGHT ANTECUBITAL  Final   Special Requests   Final    BOTTLES DRAWN AEROBIC AND ANAEROBIC Blood Culture results may not be optimal due to an inadequate volume of blood received in culture bottles   Culture   Final    NO GROWTH 3 DAYS Performed at [facility], [address] [place], [place] [address]    Report Status PENDING  Incomplete  Culture, blood (routine x 2)     Status: None (Preliminary result)   Collection Time:  04/23/20 12:20 AM   Specimen: BLOOD  Result Value Ref Range Status   Specimen Description BLOOD BLOOD RIGHT FOREARM  Final   Special Requests   Final    BOTTLES DRAWN AEROBIC AND ANAEROBIC Blood Culture adequate volume   Culture   Final    NO GROWTH 3 DAYS Performed at [facility], [address], [place], [place] [address]    Report Status PENDING  Incomplete    RADIOLOGY:  No results found.   CODE STATUS:     Code Status Orders  (From admission, onward)         Start     Ordered   04/23/20 0322  Full code  Continuous        04/23/20 0321        Code Status History    Date Active Date Inactive Code Status Order ID Comments User Context   10/26/2019 1615 11/03/2019 1916 Full Code [id number]  Modena Jansky, MD ED   Advance Care Planning Activity       TOTAL TIME TAKING CARE OF THIS PATIENT: 35 minutes.    Fritzi Mandes M.D  [facility]    CC: Primary care physician; [place]

## 2020-04-26 NOTE — Progress Notes (Signed)
Mobility Specialist - Progress Note   04/26/20 1215  Mobility  Activity Refused mobility  Mobility performed by Mobility specialist    Pt refused session at this time. States "I'm about to go home".    Katherine Perez Mobility Specialist  04/26/20, 12:16 PM

## 2020-04-28 LAB — CULTURE, BLOOD (ROUTINE X 2)
Culture: NO GROWTH
Culture: NO GROWTH
Special Requests: ADEQUATE

## 2020-06-14 ENCOUNTER — Ambulatory Visit: Payer: Self-pay | Admitting: General Surgery

## 2020-06-15 ENCOUNTER — Ambulatory Visit: Payer: Self-pay | Admitting: General Surgery

## 2020-06-15 NOTE — H&P (View-Only) (Signed)
PATIENT PROFILE: Katherine Perez is a 50 y.o. female who presents to the Clinic for consultation at the request of Dr. Tressia Miners for evaluation of distended gallbladder.  PCP:  Gladstone Lighter, MD  HISTORY OF PRESENT ILLNESS: Katherine Perez reports she has been having abdominal pain since many month ago.  She reported that she has had multiple admissions due to abdominal pain.  One of the admission was due to pancreatitis.  She reported that all her images that includes CT scan of the abdomen and abdominal ultrasound shows a distended bladder.  They have not seen gallbladder stones.  Patient reports pain exacerbated by eating.  There has been no alleviating factors.  Pain radiates to her back.  Patient denies any fever or chills today.  Patient denies any diarrhea.  Patient does have history of severe constipation.  I personally evaluated the abdominal ultrasound.  I cannot appreciate small amount of sludge on the last 2 ultrasounds.  Patient has history of multiple chronic pain conditions such as severe constipation with colonic inertia, GERD, pelvic floor dysfunction, superior mesenteric syndrome, anorexia, among others.   PROBLEM LIST: Problem List  Date Reviewed: 06/01/2020         Noted   Hypercalcemia 06/01/2020   Acute pancreatitis without infection or necrosis 04/23/2020   GERD without esophagitis 04/23/2020   Prolonged QT interval 04/23/2020   Chronic kidney disease 12/21/2019   Pressure injury of skin 10/27/2019   Acute kidney failure, unspecified (CMS-HCC) 08/23/8411   Acute metabolic encephalopathy 05/27/4008   Dehydration 10/26/2019   High anion gap metabolic acidosis 05/30/2534   Old complex tear of lateral meniscus of left knee 07/18/2019   Overview    Formatting of this note might be different from the original. Added automatically from request for surgery [id number]      Iron deficiency 02/12/2019   Mild intermittent asthma without complication 42/59/5638   Nondisplaced supracondylar  fracture without intracondylar extension of lower end of left femur, initial encounter for closed fracture (CMS-HCC) 01/13/2019   Overview    Formatting of this note might be different from the original. Added automatically from request for surgery [id number]      Pelvic floor dysfunction 07/15/2018   Chronic constipation 05/27/2018   Rectal prolapse 05/27/2018   Primary osteoarthritis of both first carpometacarpal joints 04/24/2018   CKD (chronic kidney disease) stage 3, GFR 30-59 ml/min (CMS-HCC) 07/01/2017   Generalized abdominal pain 02/11/2017   Weakness of both lower extremities 02/11/2017   Electrolyte abnormality 12/31/2016   Abnormal urine findings 04/05/2016   Anorexia nervosa with bulimia 02/03/2016   Intractable cyclical vomiting with nausea 01/04/2016   SMAS (superior mesenteric artery syndrome) (CMS-HCC) 01/04/2016   Yeast infection 12/22/2015   Urinary tract infection, site not specified 05/03/2015   Hematuria 02/13/2015   Cigarette smoker 12/31/2014   Laxative habit 12/31/2014   Hyponatremia 12/15/2014   Chronic interstitial cystitis 11/18/2014   Dyspareunia in female 11/18/2014   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   Urinary urgency 11/18/2014   Nocturia 11/18/2014   Drug-induced hypokalemia 11/25/2013   Asthma 09/30/2013      GENERAL REVIEW OF SYSTEMS:   General ROS: Positive for - chills, fatigue, fever, weight gain or weight loss Allergy and Immunology ROS: negative for - hives  Hematological and Lymphatic ROS: negative for - bleeding problems or bruising, negative for palpable nodes Endocrine ROS: negative for - heat or cold intolerance, hair changes Respiratory ROS: Positive for - cough, shortness of breath or wheezing Cardiovascular ROS: no chest pain or  palpitations GI ROS: Positive for nausea, vomiting, abdominal pain, constipation Musculoskeletal ROS: Positive for - joint swelling or muscle pain Neurological ROS: negative for - confusion, syncope Dermatological ROS: negative for pruritus  and rash Psychiatric: Positive for anxiety, depression, difficulty sleeping and memory loss  MEDICATIONS: Current Outpatient Medications  Medication Sig Dispense Refill  . acetaminophen (TYLENOL ARTHRITIS PAIN ORAL) Take 2 tablets by mouth 2 (two) times daily as needed       . amitriptyline (ELAVIL) 25 MG tablet TAKE 1-2 TABLETS BY MOUTH NIGHTLY    . brimonidine 0.025 % Drop Apply 1 drop to eye once daily       . calcium carbonate-vit D3-min 600 mg calcium- 400 unit Tab Take 2 tablets by mouth once daily    . diclofenac-misoprostoL (ARTHROTEC 75) 75-200 mg-mcg DR tablet 1 tablet 2 (two) times daily with meals       . famotidine (PEPCID) 40 MG tablet Take 40 mg by mouth nightly       . fluticasone propion-salmeteroL (ADVAIR DISKUS) 100-50 mcg/dose diskus inhaler Inhale 1 inhalation into the lungs every 12 (twelve) hours       . fluticasone propion-salmeteroL (WIXELA INHUB) 250-50 mcg/dose diskus inhaler Inhale 1 inhalation into the lungs every 12 (twelve) hours    . fluticasone propionate (FLONASE) 50 mcg/actuation nasal spray 1 spray by Each Nare route 2 times daily.    Marland Kitchen gabapentin (NEURONTIN) 300 MG capsule Take by mouth 3 tabs in the afternoon and 1 in the PM and as needed      . hydrOXYzine (ATARAX) 25 MG tablet TAKE 1-2 TABLET BY MOUTH NIGHTLY 1-2 HOURS BEFORE GOING TO SLEEP    . medroxyPROGESTERone (DEPO-PROVERA) 150 mg/mL injection Inject into the muscle every 3 (three) months    . methen-m.blue-s.phos-phsal-hyo 118-10-40.8-36 mg Cap 1 po q6 hr prn burning    . metoclopramide (REGLAN) 10 MG tablet Take 10 mg by mouth 2 (two) times daily       . omeprazole (PRILOSEC) 40 MG DR capsule TAKE ONE CAPSULE BY MOUTH DAILY BEFORE BREAKFAST FOR 60 DAYS 90 capsule 1  . ondansetron (ZOFRAN) 8 MG tablet TAKE 1 TABLET BY MOUTH EVERY 8 HOURS AS NEEDED    . oxybutynin (DITROPAN-XL) 10 MG XL tablet Take 1 tablet by mouth once daily    . pentosan polysulfate (ELMIRON) 100 mg capsule Take 100 mg by mouth  nightly       . plecanatide (TRULANCE) 3 mg tablet Take by mouth once daily       . promethazine (PHENERGAN) 25 MG tablet Take by mouth every 6 (six) hours as needed       . RABEprazole (ACIPHEX) 20 mg EC tablet TAKE 1 TABLET BY MOUTH TWICE A DAY    . sodium citrate-citric acid (CYTRA-2) 500-334 mg/5 mL solution Take by mouth    . tamsulosin (FLOMAX) 0.4 mg capsule TAKE 1 CAPSULE BY MOUTH EVERYDAY AT BEDTIME    . theophylline (UNI-DUR) 400 mg 24 hr tablet Take 400 mg by mouth once daily       . thiamine (VITAMIN B-1) 100 MG tablet Take 1 tablet (100 mg total) by mouth once daily 30 tablet 3  . traMADoL (ULTRAM) 50 mg tablet Take by mouth every 8 (eight) hours as needed    . trimethoprim 100 mg tablet 100 mg once daily        No current facility-administered medications for this visit.    ALLERGIES: Cetirizine, Diazepam, and Baclofen  PAST MEDICAL HISTORY:  Past Medical History:  Diagnosis Date  . Hypokalemia   . Interstitial cystitis   . Neuropathy     PAST SURGICAL HISTORY: Past Surgical History:  Procedure Laterality Date  . HEMORRHOID SURGERY  10/23/2018  . leg surgery Left 01/20/2019   femur     FAMILY HISTORY: Family History  Problem Relation Age of Onset  . Cervical cancer Mother   . High blood pressure (Hypertension) Father   . Hyperlipidemia (Elevated cholesterol) Father   . Breast cancer Sister      SOCIAL HISTORY: Social History   Socioeconomic History  . Marital status: Married    Spouse name: Not on file  . Number of children: Not on file  . Years of education: Not on file  . Highest education level: Not on file  Occupational History  . Not on file  Tobacco Use  . Smoking status: Current Every Day Smoker    Types: Cigarettes  . Smokeless tobacco: Never Used  Vaping Use  . Vaping Use: Never used  Substance and Sexual Activity  . Alcohol use: Yes  . Drug use: Never  . Sexual activity: Defer  Other Topics Concern  . Not on file  Social History  Narrative  . Not on file   Social Determinants of Health   Financial Resource Strain: Not on file  Food Insecurity: Not on file  Transportation Needs: Not on file    PHYSICAL EXAM: Vitals:   06/14/20 1343  BP: 97/62  Pulse: 68   Body mass index is 18.16 kg/m. Weight: 55.8 kg (123 lb)   GENERAL: Alert, active, oriented x3  HEENT: Pupils equal reactive to light. Extraocular movements are intact. Sclera clear. Palpebral conjunctiva normal red color.Pharynx clear.  NECK: Supple with no palpable mass and no adenopathy.  LUNGS: Sound clear with no rales rhonchi or wheezes.  HEART: Regular rhythm S1 and S2 without murmur.  ABDOMEN: Soft and depressible, nontender with no palpable mass, no hepatomegaly.   EXTREMITIES: Well-developed well-nourished symmetrical with no dependent edema.  NEUROLOGICAL: Awake alert oriented, facial expression symmetrical, moving all extremities.  REVIEW OF DATA: I have reviewed the following data today: Initial consult on 06/14/2020  Component Date Value  . Glucose 06/14/2020 80   . Sodium 06/14/2020 132 (!)  . Potassium 06/14/2020 3.2 (!)  . Chloride 06/14/2020 105   . Carbon Dioxide (CO2) 06/14/2020 17.1 (!)  . Urea Nitrogen (BUN) 06/14/2020 53 (!)  . Creatinine 06/14/2020 2.0 (!)  . Glomerular Filtration Ra* 06/14/2020 27 (!)  . Calcium 06/14/2020 10.3   . AST  06/14/2020 12   . ALT  06/14/2020 9   . Alk Phos (alkaline Phosp* 06/14/2020 98   . Albumin 06/14/2020 4.2   . Bilirubin, Total 06/14/2020 0.3   . Protein, Total 06/14/2020 6.5   . A/G Ratio 06/14/2020 1.8   . WBC (White Blood Cell Co* 06/14/2020 15.1 (!)  . RBC (Red Blood Cell Coun* 06/14/2020 3.26 (!)  . Hemoglobin 06/14/2020 10.3 (!)  . Hematocrit 06/14/2020 32.5 (!)  . MCV (Mean Corpuscular Vo* 06/14/2020 99.7   . MCH (Mean Corpuscular He* 06/14/2020 31.6 (!)  . MCHC (Mean Corpuscular H* 06/14/2020 31.7 (!)  . Platelet Count 06/14/2020 528 (!)  . RDW-CV (Red Cell  Distrib* 06/14/2020 15.6 (!)  . MPV (Mean Platelet Volum* 06/14/2020 8.6 (!)  . Neutrophils 06/14/2020 8.34 (!)  . Lymphocytes 06/14/2020 3.24   . Monocytes 06/14/2020 1.30   . Eosinophils 06/14/2020 1.92 (!)  . Basophils 06/14/2020  0.16 (!)  . Neutrophil % 06/14/2020 55.4   . Lymphocyte % 06/14/2020 21.5   . Monocyte % 06/14/2020 8.6   . Eosinophil % 06/14/2020 12.7 (!)  . Basophil% 06/14/2020 1.1   . Immature Granulocyte % 06/14/2020 0.7   . Immature Granulocyte Cou* 06/14/2020 0.10 (!)  . Magnesium 06/14/2020 2.7 (!)  Office Visit on 03/29/2020  Component Date Value  . Glucose 03/29/2020 84   . Sodium 03/29/2020 133 (!)  . Potassium 03/29/2020 4.0   . Chloride 03/29/2020 102   . Carbon Dioxide (CO2) 03/29/2020 15.8 (!)  . Urea Nitrogen (BUN) 03/29/2020 45 (!)  . Creatinine 03/29/2020 2.0 (!)  . Glomerular Filtration Ra* 03/29/2020 27 (!)  . Calcium 03/29/2020 12.2 (!)  . AST  03/29/2020 23   . ALT  03/29/2020 25   . Alk Phos (alkaline Phosp* 03/29/2020 92   . Albumin 03/29/2020 4.5   . Bilirubin, Total 03/29/2020 0.4   . Protein, Total 03/29/2020 7.1   . A/G Ratio 03/29/2020 1.7   . WBC (White Blood Cell Co* 03/29/2020 16.6 (!)  . RBC (Red Blood Cell Coun* 03/29/2020 3.66 (!)  . Hemoglobin 03/29/2020 11.5 (!)  . Hematocrit 03/29/2020 36.6   . MCV (Mean Corpuscular Vo* 03/29/2020 100.0   . MCH (Mean Corpuscular He* 03/29/2020 31.4 (!)  . MCHC (Mean Corpuscular H* 03/29/2020 31.4 (!)  . Platelet Count 03/29/2020 490 (!)  . RDW-CV (Red Cell Distrib* 03/29/2020 16.8 (!)  . MPV (Mean Platelet Volum* 03/29/2020 8.5 (!)  . Neutrophils 03/29/2020 10.23 (!)  . Lymphocytes 03/29/2020 4.05 (!)  . Monocytes 03/29/2020 1.48   . Eosinophils 03/29/2020 0.62 (!)  . Basophils 03/29/2020 0.14 (!)  . Neutrophil % 03/29/2020 61.5   . Lymphocyte % 03/29/2020 24.4   . Monocyte % 03/29/2020 8.9   . Eosinophil % 03/29/2020 3.7   . Basophil% 03/29/2020 0.8   . Immature Granulocyte %  03/29/2020 0.7   . Immature Granulocyte Cou* 03/29/2020 0.11 (!)  . Color 03/29/2020 Nyoka Cowden  . Clarity 03/29/2020 SL Cloudy (!)  . Specific Gravity 03/29/2020 1.020   . pH, Urine 03/29/2020 6.0   . Protein, Urinalysis 03/29/2020 30  (!)  . Glucose, Urinalysis 03/29/2020 Negative   . Ketones, Urinalysis 03/29/2020 Negative   . Blood, Urinalysis 03/29/2020 Small (!)  . Nitrite, Urinalysis 03/29/2020 Negative   . Leukocyte Esterase, Urin* 03/29/2020 Small (!)  . White Blood Cells, Urina* 03/29/2020 4-10 (!)  . Red Blood Cells, Urinaly* 03/29/2020 0-3   . Bacteria, Urinalysis 03/29/2020 Many (!)  . Squamous Epithelial Cell* 03/29/2020 None Seen   . Urine Culture, Routine -* 03/29/2020 Final report (!)  . Result 1 - [facility] 03/29/2020 Klebsiella pneumoniae (!)  . Antimicrobial Susceptibi* 03/29/2020 Comment      ASSESSMENT: Katherine Perez is a 50 y.o. female presenting for consultation for gallbladder distention.  Patient was oriented about the diagnosis of sludge in her gallbladder. Also oriented about what is the gallbladder, its anatomy and function and the implications of having sludge in her gallbladder.  She was oriented that sludge can cause similar pain as having stones.  She was also oriented that sludge can cause pancreatitis. The patient was oriented about the treatment alternatives (observation vs cholecystectomy). Patient was oriented that a low percentage of patient will continue to have similar pain symptoms even after the gallbladder is removed.  She was oriented that due to her multiple other conditions she can continue having other type of abdominal pain  despite having the gallbladder removed.  Surgical technique (open vs laparoscopic) was discussed. It was also discussed the goals of the surgery (decrease the pain episodes and avoid the risk of cholecystitis) and the risk of surgery including: bleeding, infection, common bile duct injury, stone retention, injury to other organs  such as bowel, liver, stomach, other complications such as hernia, bowel obstruction among others. Also discussed with patient about anesthesia and its complications such as: reaction to medications, pneumonia, heart complications, death, among others.   Calculus of gallbladder without cholecystitis without obstruction [K80.20]  PLAN: 1.  Robotic assisted laparoscopic cholecystectomy (76546) 2.  CBC, CMP 3. Internal medicine clearance (Dr. Tressia Miners) 4.  Do not take aspirin 5 days before the procedure 5.  Contact us if has any question or concern.  Patient and her husband verbalized understanding, all questions were answered, and were agreeable with the plan outlined above.     Herbert Pun, MD  Electronically signed by Herbert Pun, MD

## 2020-06-15 NOTE — H&P (Signed)
PATIENT PROFILE: Katherine Perez is a 50 y.o. female who presents to the Clinic for consultation at the request of Dr. Tressia Miners for evaluation of distended gallbladder.  PCP:  Gladstone Lighter, MD  HISTORY OF PRESENT ILLNESS: Katherine Perez reports she has been having abdominal pain since many month ago.  She reported that she has had multiple admissions due to abdominal pain.  One of the admission was due to pancreatitis.  She reported that all her images that includes CT scan of the abdomen and abdominal ultrasound shows a distended bladder.  They have not seen gallbladder stones.  Patient reports pain exacerbated by eating.  There has been no alleviating factors.  Pain radiates to her back.  Patient denies any fever or chills today.  Patient denies any diarrhea.  Patient does have history of severe constipation.  I personally evaluated the abdominal ultrasound.  I cannot appreciate small amount of sludge on the last 2 ultrasounds.  Patient has history of multiple chronic pain conditions such as severe constipation with colonic inertia, GERD, pelvic floor dysfunction, superior mesenteric syndrome, anorexia, among others.   PROBLEM LIST: Problem List  Date Reviewed: 06/01/2020         Noted   Hypercalcemia 06/01/2020   Acute pancreatitis without infection or necrosis 04/23/2020   GERD without esophagitis 04/23/2020   Prolonged QT interval 04/23/2020   Chronic kidney disease 12/21/2019   Pressure injury of skin 10/27/2019   Acute kidney failure, unspecified (CMS-HCC) 09/24/1495   Acute metabolic encephalopathy 0/05/6376   Dehydration 10/26/2019   High anion gap metabolic acidosis 08/28/8500   Old complex tear of lateral meniscus of left knee 07/18/2019   Overview    Formatting of this note might be different from the original. Added automatically from request for surgery [id number]      Iron deficiency 02/12/2019   Mild intermittent asthma without complication 86/76/7209   Nondisplaced supracondylar  fracture without intracondylar extension of lower end of left femur, initial encounter for closed fracture (CMS-HCC) 01/13/2019   Overview    Formatting of this note might be different from the original. Added automatically from request for surgery [id number]      Pelvic floor dysfunction 07/15/2018   Chronic constipation 05/27/2018   Rectal prolapse 05/27/2018   Primary osteoarthritis of both first carpometacarpal joints 04/24/2018   CKD (chronic kidney disease) stage 3, GFR 30-59 ml/min (CMS-HCC) 07/01/2017   Generalized abdominal pain 02/11/2017   Weakness of both lower extremities 02/11/2017   Electrolyte abnormality 12/31/2016   Abnormal urine findings 04/05/2016   Anorexia nervosa with bulimia 02/03/2016   Intractable cyclical vomiting with nausea 01/04/2016   SMAS (superior mesenteric artery syndrome) (CMS-HCC) 01/04/2016   Yeast infection 12/22/2015   Urinary tract infection, site not specified 05/03/2015   Hematuria 02/13/2015   Cigarette smoker 12/31/2014   Laxative habit 12/31/2014   Hyponatremia 12/15/2014   Chronic interstitial cystitis 11/18/2014   Dyspareunia in female 11/18/2014   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   Urinary urgency 11/18/2014   Nocturia 11/18/2014   Drug-induced hypokalemia 11/25/2013   Asthma 09/30/2013      GENERAL REVIEW OF SYSTEMS:   General ROS: Positive for - chills, fatigue, fever, weight gain or weight loss Allergy and Immunology ROS: negative for - hives  Hematological and Lymphatic ROS: negative for - bleeding problems or bruising, negative for palpable nodes Endocrine ROS: negative for - heat or cold intolerance, hair changes Respiratory ROS: Positive for - cough, shortness of breath or wheezing Cardiovascular ROS: no chest pain or  palpitations GI ROS: Positive for nausea, vomiting, abdominal pain, constipation Musculoskeletal ROS: Positive for - joint swelling or muscle pain Neurological ROS: negative for - confusion, syncope Dermatological ROS: negative for pruritus  and rash Psychiatric: Positive for anxiety, depression, difficulty sleeping and memory loss  MEDICATIONS: Current Outpatient Medications  Medication Sig Dispense Refill  . acetaminophen (TYLENOL ARTHRITIS PAIN ORAL) Take 2 tablets by mouth 2 (two) times daily as needed       . amitriptyline (ELAVIL) 25 MG tablet TAKE 1-2 TABLETS BY MOUTH NIGHTLY    . brimonidine 0.025 % Drop Apply 1 drop to eye once daily       . calcium carbonate-vit D3-min 600 mg calcium- 400 unit Tab Take 2 tablets by mouth once daily    . diclofenac-misoprostoL (ARTHROTEC 75) 75-200 mg-mcg DR tablet 1 tablet 2 (two) times daily with meals       . famotidine (PEPCID) 40 MG tablet Take 40 mg by mouth nightly       . fluticasone propion-salmeteroL (ADVAIR DISKUS) 100-50 mcg/dose diskus inhaler Inhale 1 inhalation into the lungs every 12 (twelve) hours       . fluticasone propion-salmeteroL (WIXELA INHUB) 250-50 mcg/dose diskus inhaler Inhale 1 inhalation into the lungs every 12 (twelve) hours    . fluticasone propionate (FLONASE) 50 mcg/actuation nasal spray 1 spray by Each Nare route 2 times daily.    Marland Kitchen gabapentin (NEURONTIN) 300 MG capsule Take by mouth 3 tabs in the afternoon and 1 in the PM and as needed      . hydrOXYzine (ATARAX) 25 MG tablet TAKE 1-2 TABLET BY MOUTH NIGHTLY 1-2 HOURS BEFORE GOING TO SLEEP    . medroxyPROGESTERone (DEPO-PROVERA) 150 mg/mL injection Inject into the muscle every 3 (three) months    . methen-m.blue-s.phos-phsal-hyo 118-10-40.8-36 mg Cap 1 po q6 hr prn burning    . metoclopramide (REGLAN) 10 MG tablet Take 10 mg by mouth 2 (two) times daily       . omeprazole (PRILOSEC) 40 MG DR capsule TAKE ONE CAPSULE BY MOUTH DAILY BEFORE BREAKFAST FOR 60 DAYS 90 capsule 1  . ondansetron (ZOFRAN) 8 MG tablet TAKE 1 TABLET BY MOUTH EVERY 8 HOURS AS NEEDED    . oxybutynin (DITROPAN-XL) 10 MG XL tablet Take 1 tablet by mouth once daily    . pentosan polysulfate (ELMIRON) 100 mg capsule Take 100 mg by mouth  nightly       . plecanatide (TRULANCE) 3 mg tablet Take by mouth once daily       . promethazine (PHENERGAN) 25 MG tablet Take by mouth every 6 (six) hours as needed       . RABEprazole (ACIPHEX) 20 mg EC tablet TAKE 1 TABLET BY MOUTH TWICE A DAY    . sodium citrate-citric acid (CYTRA-2) 500-334 mg/5 mL solution Take by mouth    . tamsulosin (FLOMAX) 0.4 mg capsule TAKE 1 CAPSULE BY MOUTH EVERYDAY AT BEDTIME    . theophylline (UNI-DUR) 400 mg 24 hr tablet Take 400 mg by mouth once daily       . thiamine (VITAMIN B-1) 100 MG tablet Take 1 tablet (100 mg total) by mouth once daily 30 tablet 3  . traMADoL (ULTRAM) 50 mg tablet Take by mouth every 8 (eight) hours as needed    . trimethoprim 100 mg tablet 100 mg once daily        No current facility-administered medications for this visit.    ALLERGIES: Cetirizine, Diazepam, and Baclofen  PAST MEDICAL HISTORY:  Past Medical History:  Diagnosis Date  . Hypokalemia   . Interstitial cystitis   . Neuropathy     PAST SURGICAL HISTORY: Past Surgical History:  Procedure Laterality Date  . HEMORRHOID SURGERY  10/23/2018  . leg surgery Left 01/20/2019   femur     FAMILY HISTORY: Family History  Problem Relation Age of Onset  . Cervical cancer Mother   . High blood pressure (Hypertension) Father   . Hyperlipidemia (Elevated cholesterol) Father   . Breast cancer Sister      SOCIAL HISTORY: Social History   Socioeconomic History  . Marital status: Married    Spouse name: Not on file  . Number of children: Not on file  . Years of education: Not on file  . Highest education level: Not on file  Occupational History  . Not on file  Tobacco Use  . Smoking status: Current Every Day Smoker    Types: Cigarettes  . Smokeless tobacco: Never Used  Vaping Use  . Vaping Use: Never used  Substance and Sexual Activity  . Alcohol use: Yes  . Drug use: Never  . Sexual activity: Defer  Other Topics Concern  . Not on file  Social History  Narrative  . Not on file   Social Determinants of Health   Financial Resource Strain: Not on file  Food Insecurity: Not on file  Transportation Needs: Not on file    PHYSICAL EXAM: Vitals:   06/14/20 1343  BP: 97/62  Pulse: 68   Body mass index is 18.16 kg/m. Weight: 55.8 kg (123 lb)   GENERAL: Alert, active, oriented x3  HEENT: Pupils equal reactive to light. Extraocular movements are intact. Sclera clear. Palpebral conjunctiva normal red color.Pharynx clear.  NECK: Supple with no palpable mass and no adenopathy.  LUNGS: Sound clear with no rales rhonchi or wheezes.  HEART: Regular rhythm S1 and S2 without murmur.  ABDOMEN: Soft and depressible, nontender with no palpable mass, no hepatomegaly.   EXTREMITIES: Well-developed well-nourished symmetrical with no dependent edema.  NEUROLOGICAL: Awake alert oriented, facial expression symmetrical, moving all extremities.  REVIEW OF DATA: I have reviewed the following data today: Initial consult on 06/14/2020  Component Date Value  . Glucose 06/14/2020 80   . Sodium 06/14/2020 132 (!)  . Potassium 06/14/2020 3.2 (!)  . Chloride 06/14/2020 105   . Carbon Dioxide (CO2) 06/14/2020 17.1 (!)  . Urea Nitrogen (BUN) 06/14/2020 53 (!)  . Creatinine 06/14/2020 2.0 (!)  . Glomerular Filtration Ra* 06/14/2020 27 (!)  . Calcium 06/14/2020 10.3   . AST  06/14/2020 12   . ALT  06/14/2020 9   . Alk Phos (alkaline Phosp* 06/14/2020 98   . Albumin 06/14/2020 4.2   . Bilirubin, Total 06/14/2020 0.3   . Protein, Total 06/14/2020 6.5   . A/G Ratio 06/14/2020 1.8   . WBC (White Blood Cell Co* 06/14/2020 15.1 (!)  . RBC (Red Blood Cell Coun* 06/14/2020 3.26 (!)  . Hemoglobin 06/14/2020 10.3 (!)  . Hematocrit 06/14/2020 32.5 (!)  . MCV (Mean Corpuscular Vo* 06/14/2020 99.7   . MCH (Mean Corpuscular He* 06/14/2020 31.6 (!)  . MCHC (Mean Corpuscular H* 06/14/2020 31.7 (!)  . Platelet Count 06/14/2020 528 (!)  . RDW-CV (Red Cell  Distrib* 06/14/2020 15.6 (!)  . MPV (Mean Platelet Volum* 06/14/2020 8.6 (!)  . Neutrophils 06/14/2020 8.34 (!)  . Lymphocytes 06/14/2020 3.24   . Monocytes 06/14/2020 1.30   . Eosinophils 06/14/2020 1.92 (!)  . Basophils 06/14/2020  0.16 (!)  . Neutrophil % 06/14/2020 55.4   . Lymphocyte % 06/14/2020 21.5   . Monocyte % 06/14/2020 8.6   . Eosinophil % 06/14/2020 12.7 (!)  . Basophil% 06/14/2020 1.1   . Immature Granulocyte % 06/14/2020 0.7   . Immature Granulocyte Cou* 06/14/2020 0.10 (!)  . Magnesium 06/14/2020 2.7 (!)  Office Visit on 03/29/2020  Component Date Value  . Glucose 03/29/2020 84   . Sodium 03/29/2020 133 (!)  . Potassium 03/29/2020 4.0   . Chloride 03/29/2020 102   . Carbon Dioxide (CO2) 03/29/2020 15.8 (!)  . Urea Nitrogen (BUN) 03/29/2020 45 (!)  . Creatinine 03/29/2020 2.0 (!)  . Glomerular Filtration Ra* 03/29/2020 27 (!)  . Calcium 03/29/2020 12.2 (!)  . AST  03/29/2020 23   . ALT  03/29/2020 25   . Alk Phos (alkaline Phosp* 03/29/2020 92   . Albumin 03/29/2020 4.5   . Bilirubin, Total 03/29/2020 0.4   . Protein, Total 03/29/2020 7.1   . A/G Ratio 03/29/2020 1.7   . WBC (White Blood Cell Co* 03/29/2020 16.6 (!)  . RBC (Red Blood Cell Coun* 03/29/2020 3.66 (!)  . Hemoglobin 03/29/2020 11.5 (!)  . Hematocrit 03/29/2020 36.6   . MCV (Mean Corpuscular Vo* 03/29/2020 100.0   . MCH (Mean Corpuscular He* 03/29/2020 31.4 (!)  . MCHC (Mean Corpuscular H* 03/29/2020 31.4 (!)  . Platelet Count 03/29/2020 490 (!)  . RDW-CV (Red Cell Distrib* 03/29/2020 16.8 (!)  . MPV (Mean Platelet Volum* 03/29/2020 8.5 (!)  . Neutrophils 03/29/2020 10.23 (!)  . Lymphocytes 03/29/2020 4.05 (!)  . Monocytes 03/29/2020 1.48   . Eosinophils 03/29/2020 0.62 (!)  . Basophils 03/29/2020 0.14 (!)  . Neutrophil % 03/29/2020 61.5   . Lymphocyte % 03/29/2020 24.4   . Monocyte % 03/29/2020 8.9   . Eosinophil % 03/29/2020 3.7   . Basophil% 03/29/2020 0.8   . Immature Granulocyte %  03/29/2020 0.7   . Immature Granulocyte Cou* 03/29/2020 0.11 (!)  . Color 03/29/2020 Nyoka Cowden  . Clarity 03/29/2020 SL Cloudy (!)  . Specific Gravity 03/29/2020 1.020   . pH, Urine 03/29/2020 6.0   . Protein, Urinalysis 03/29/2020 30  (!)  . Glucose, Urinalysis 03/29/2020 Negative   . Ketones, Urinalysis 03/29/2020 Negative   . Blood, Urinalysis 03/29/2020 Small (!)  . Nitrite, Urinalysis 03/29/2020 Negative   . Leukocyte Esterase, Urin* 03/29/2020 Small (!)  . White Blood Cells, Urina* 03/29/2020 4-10 (!)  . Red Blood Cells, Urinaly* 03/29/2020 0-3   . Bacteria, Urinalysis 03/29/2020 Many (!)  . Squamous Epithelial Cell* 03/29/2020 None Seen   . Urine Culture, Routine -* 03/29/2020 Final report (!)  . Result 1 - [facility] 03/29/2020 Klebsiella pneumoniae (!)  . Antimicrobial Susceptibi* 03/29/2020 Comment      ASSESSMENT: Katherine Perez is a 50 y.o. female presenting for consultation for gallbladder distention.  Patient was oriented about the diagnosis of sludge in her gallbladder. Also oriented about what is the gallbladder, its anatomy and function and the implications of having sludge in her gallbladder.  She was oriented that sludge can cause similar pain as having stones.  She was also oriented that sludge can cause pancreatitis. The patient was oriented about the treatment alternatives (observation vs cholecystectomy). Patient was oriented that a low percentage of patient will continue to have similar pain symptoms even after the gallbladder is removed.  She was oriented that due to her multiple other conditions she can continue having other type of abdominal pain  despite having the gallbladder removed.  Surgical technique (open vs laparoscopic) was discussed. It was also discussed the goals of the surgery (decrease the pain episodes and avoid the risk of cholecystitis) and the risk of surgery including: bleeding, infection, common bile duct injury, stone retention, injury to other organs  such as bowel, liver, stomach, other complications such as hernia, bowel obstruction among others. Also discussed with patient about anesthesia and its complications such as: reaction to medications, pneumonia, heart complications, death, among others.   Calculus of gallbladder without cholecystitis without obstruction [K80.20]  PLAN: 1.  Robotic assisted laparoscopic cholecystectomy (09828) 2.  CBC, CMP 3. Internal medicine clearance (Dr. Tressia Miners) 4.  Do not take aspirin 5 days before the procedure 5.  Contact us if has any question or concern.  Patient and her husband verbalized understanding, all questions were answered, and were agreeable with the plan outlined above.     Herbert Pun, MD  Electronically signed by Herbert Pun, MD

## 2020-06-20 ENCOUNTER — Inpatient Hospital Stay
Admission: RE | Admit: 2020-06-20 | Discharge: 2020-06-20 | Disposition: A | Discharge status: Home / Self Care | Payer: BC Managed Care – PPO | Source: Ambulatory Visit

## 2020-06-20 NOTE — Pre-Procedure Instructions (Signed)
EKG  ED ECG REPORT I, SUNG,JADE J, the attending physician, personally viewed and interpreted this ECG.   Date: 04/23/2020  EKG Time: 2128  Rate: 103  Rhythm: sinus tachycardia  Axis: Normal  Intervals: QTC 573  ST&T Change: Nonspecific Compared with 10/2019 EKG, QTC was 533  ____________________________________________  RADIOLOGY I, SUNG,JADE J, personally viewed and evaluated these images (plain radiographs) as part of my medical decision making, as well as reviewing the written report by the radiologist.  ED MD interpretation: No ICH, acute pancreatitis, stable gallbladder distention without pericholecystic inflammatory changes, no ductal dilatation; no acute cardiopulmonary process  Official radiology report(s): CT Abdomen Pelvis Wo Contrast  Result Date: 04/23/2020 CLINICAL DATA:  Altered mental status EXAM: CT HEAD WITHOUT CONTRAST CT ABDOMEN AND PELVIS WITHOUT CONTRAST TECHNIQUE: Contiguous axial images were obtained from the base of the skull through the vertex without intravenous contrast. Multidetector CT imaging of the abdomen and pelvis was performed following the standard protocol without IV contrast. COMPARISON:  CT head 10/26/2019, CT abdomen pelvis 10/28/2019 FINDINGS: CT HEAD FINDINGS Brain: Normal anatomic configuration. No abnormal intra or extra-axial mass lesion or fluid collection. No abnormal mass effect or midline shift. No evidence of acute intracranial hemorrhage or infarct. Ventricular size is normal. Cerebellum unremarkable. Vascular: Unremarkable Skull: Intact Sinuses/Orbits: Paranasal sinuses are clear. Orbits are unremarkable. Other: Mastoid air cells and middle ear cavities are clear. CT ABDOMEN AND PELVIS FINDINGS Lower chest: Previously noted bibasilar pulmonary infiltrates have resolved. The visualized lung bases are clear. Visualized heart and pericardium are unremarkable. Hepatobiliary: The gallbladder is distended, similar to that noted on prior  examination, without pericholecystic inflammatory change identified. The liver is unremarkable. No intra or extrahepatic biliary ductal dilation identified. Pancreas: There is extensive fluid seen tracking within the greater omentum, within the lesser sac surrounding the entire pancreas, and tracking into the small bowel mesentery. The distribution of inflammatory change favors changes of acute pancreatitis. Parenchymal viability is not well assessed on this noncontrast examination. The pancreatic duct is not dilated. No pancreatic parenchymal calcifications are seen. There is circumferential thickening of the second portion of the duodenum possibly related to the adjacent inflammatory process. No loculated peripancreatic fluid collections are identified. Spleen: Unremarkable Adrenals/Urinary Tract: The adrenal glands are unremarkable. Fine calcifications are seen within the kidneys bilaterally in keeping with changes of medullary nephrocalcinosis. Mild asymmetric cortical atrophy of the right kidney. No hydronephrosis. Bladder unremarkable. Stomach/Bowel: Stomach is within normal limits. Appendix appears normal. No evidence of bowel wall thickening, distention, or inflammatory changes. No free intraperitoneal gas or fluid. Vascular/Lymphatic: Extensive aortoiliac atherosclerotic calcification. No aortic aneurysm. No pathologic adenopathy within the abdomen and pelvis. Reproductive: Status post hysterectomy. No adnexal masses. Other: No abdominal wall hernia. Musculoskeletal: No lytic or blastic bone lesions are seen. IMPRESSION: Extensive peripancreatic inflammatory change with fluid seen tracking into the mesentery and greater omentum in keeping with changes of acute pancreatitis. Pancreatic viability is not well assessed on this examination. No peripancreatic fluid collections identified. Medullary nephrocalcinosis. Mild asymmetric cortical atrophy of the right kidney, new since prior examination. Persistent  distension of the gallbladder without superimposed pericholecystic inflammatory change possibly related to fasting state. Peripheral vascular disease. Aortic Atherosclerosis (ICD10-I70.0). Electronically Signed   By: Fidela Salisbury MD   On: 04/23/2020 00:03   DG Chest 1 View  Result Date: 04/23/2020 CLINICAL DATA:  Pneumonia EXAM: CHEST  1 VIEW COMPARISON:  04/22/2020 FINDINGS: The heart size and mediastinal contours are within normal limits. Both lungs are clear. The  visualized skeletal structures are unremarkable. IMPRESSION: No active disease. Electronically Signed   By: Fidela Salisbury MD   On: 04/23/2020 03:15   CT Head Wo Contrast  Result Date: 04/23/2020 CLINICAL DATA:  Altered mental status EXAM: CT HEAD WITHOUT CONTRAST CT ABDOMEN AND PELVIS WITHOUT CONTRAST TECHNIQUE: Contiguous axial images were obtained from the base of the skull through the vertex without intravenous contrast. Multidetector CT imaging of the abdomen and pelvis was performed following the standard protocol without IV contrast. COMPARISON:  CT head 10/26/2019, CT abdomen pelvis 10/28/2019 FINDINGS: CT HEAD FINDINGS Brain: Normal anatomic configuration. No abnormal intra or extra-axial mass lesion or fluid collection. No abnormal mass effect or midline shift. No evidence of acute intracranial hemorrhage or infarct. Ventricular size is normal. Cerebellum unremarkable. Vascular: Unremarkable Skull: Intact Sinuses/Orbits: Paranasal sinuses are clear. Orbits are unremarkable. Other: Mastoid air cells and middle ear cavities are clear. CT ABDOMEN AND PELVIS FINDINGS Lower chest: Previously noted bibasilar pulmonary infiltrates have resolved. The visualized lung bases are clear. Visualized heart and pericardium are unremarkable. Hepatobiliary: The gallbladder is distended, similar to that noted on prior examination, without pericholecystic inflammatory change identified. The liver is unremarkable. No intra or extrahepatic biliary ductal  dilation identified. Pancreas: There is extensive fluid seen tracking within the greater omentum, within the lesser sac surrounding the entire pancreas, and tracking into the small bowel mesentery. The distribution of inflammatory change favors changes of acute pancreatitis. Parenchymal viability is not well assessed on this noncontrast examination. The pancreatic duct is not dilated. No pancreatic parenchymal calcifications are seen. There is circumferential thickening of the second portion of the duodenum possibly related to the adjacent inflammatory process. No loculated peripancreatic fluid collections are identified. Spleen: Unremarkable Adrenals/Urinary Tract: The adrenal glands are unremarkable. Fine calcifications are seen within the kidneys bilaterally in keeping with changes of medullary nephrocalcinosis. Mild asymmetric cortical atrophy of the right kidney. No hydronephrosis. Bladder unremarkable. Stomach/Bowel: Stomach is within normal limits. Appendix appears normal. No evidence of bowel wall thickening, distention, or inflammatory changes. No free intraperitoneal gas or fluid. Vascular/Lymphatic: Extensive aortoiliac atherosclerotic calcification. No aortic aneurysm. No pathologic adenopathy within the abdomen and pelvis. Reproductive: Status post hysterectomy. No adnexal masses. Other: No abdominal wall hernia. Musculoskeletal: No lytic or blastic bone lesions are seen. IMPRESSION: Extensive peripancreatic inflammatory change with fluid seen tracking into the mesentery and greater omentum in keeping with changes of acute pancreatitis. Pancreatic viability is not well assessed on this examination. No peripancreatic fluid collections identified. Medullary nephrocalcinosis. Mild asymmetric cortical atrophy of the right kidney, new since prior examination. Persistent distension of the gallbladder without superimposed pericholecystic inflammatory change possibly related to fasting state. Peripheral  vascular disease. Aortic Atherosclerosis (ICD10-I70.0). Electronically Signed   By: Fidela Salisbury MD   On: 04/23/2020 00:03   DG Chest Port 1 View  Result Date: 04/22/2020 CLINICAL DATA:  Encephalopathy.  Altered mental status. EXAM: PORTABLE CHEST 1 VIEW COMPARISON:  10/26/2019 FINDINGS: Resolved bibasilar opacities from prior exam. The lungs are clear. The heart is normal in size with normal mediastinal contours. No pleural fluid or pneumothorax. No pulmonary edema. No acute osseous abnormalities are seen. IMPRESSION: Negative chest radiograph.  Resolved bibasilar opacities from prior. Electronically Signed   By: Keith Rake M.D.   On: 04/22/2020 23:53    ____________________________________________   PROCEDURES  Procedure(s) performed (including Critical Care):  .1-3 Lead EKG Interpretation Performed by: Paulette Blanch, MD Authorized by: Paulette Blanch, MD     Interpretation: abnormal  ECG rate:  103   ECG rate assessment: tachycardic     Rhythm: sinus tachycardia     Ectopy: none     Conduction: normal   Comments:     Placed on cardiac monitor to evaluate for arrhythmias     ____________________________________________   INITIAL IMPRESSION / ASSESSMENT AND PLAN / ED COURSE  As part of my medical decision making, I reviewed the following data within the electronic MEDICAL RECORD NUMBER History obtained from family, Nursing notes reviewed and incorporated, Labs reviewed, EKG interpreted, Old chart reviewed, Radiograph reviewed, Discussed with admitting physician and Notes from prior ED visits     50 year old female presenting with altered mentation. Differential diagnosis includes, but is not limited to, alcohol, illicit or prescription medications, or other toxic ingestion; intracranial pathology such as stroke or intracerebral hemorrhage; fever or infectious causes including sepsis; hypoxemia and/or hypercarbia; uremia; trauma; endocrine related disorders  such as diabetes, hypoglycemia, and thyroid-related diseases; hypertensive encephalopathy; etc.  Laboratory and imaging results demonstrate hyponatremia, hypokalemia, hypercalcemia, AKI with elevated anion gap, elevated lipase, elevated troponin which is likely secondary to demand ischemia, moderate leukocytosis.  CT scan consistent with acute pancreatitis.  Will initiate IV fluid resuscitation, IV potassium replacement.  Will discuss with hospitalist services for admission.    ____________________________________________   FINAL CLINICAL IMPRESSION(S) / ED DIAGNOSES  Final diagnoses:  Altered mental status, unspecified altered mental status type  AKI (acute kidney injury) ([place])  Uremic encephalopathy  Hypokalemia  Acute pancreatitis, unspecified complication status, unspecified pancreatitis type  Hypercalcemia        ED Discharge Orders    None      *Please note:  Katherine Perez was evaluated in Emergency Department on 04/23/2020 for the symptoms described in the history of present illness. She was evaluated in the context of the global COVID-19 pandemic, which necessitated consideration that the patient might be at risk for infection with the SARS-CoV-2 virus that causes COVID-19. Institutional protocols and algorithms that pertain to the evaluation of patients at risk for COVID-19 are in a state of rapid change based on information released by regulatory bodies including the CDC and federal and state organizations. These policies and algorithms were followed during the patient's care in the ED.  Some ED evaluations and interventions may be delayed as a result of limited staffing during and the pandemic.*   Note:  This document was prepared using Dragon voice recognition software and may include unintentional dictation errors.   Paulette Blanch, MD 04/23/20 [phone]         Electronically signed by Paulette Blanch, MD at 04/23/2020 4:05 AM  ED to Hosp-Admission  (Discharged) on 04/22/2020      ED to Hosp-Admission (Discharged) on 04/22/2020         Detailed Report       Note shared with patient

## 2020-06-22 ENCOUNTER — Other Ambulatory Visit: Payer: Self-pay

## 2020-06-22 ENCOUNTER — Encounter
Admission: RE | Admit: 2020-06-22 | Discharge: 2020-06-22 | Disposition: A | Discharge status: Home / Self Care | Payer: BC Managed Care – PPO | Source: Ambulatory Visit | Attending: General Surgery | Admitting: General Surgery

## 2020-06-22 DIAGNOSIS — Z01812 Encounter for preprocedural laboratory examination: Secondary | ICD-10-CM | POA: Diagnosis not present

## 2020-06-22 HISTORY — DX: Pneumonia, unspecified organism: J18.9

## 2020-06-22 HISTORY — DX: Unspecified asthma, uncomplicated: J45.909

## 2020-06-22 NOTE — Patient Instructions (Addendum)
Your procedure is scheduled on: 06/27/20 Report to the Registration Desk on the 1st floor of the [facility]. To find out your arrival time, please call [phone] between 1PM - 3PM on: 06/24/20  REMEMBER: Instructions that are not followed completely may result in serious medical risk, up to and including death; or upon the discretion of your surgeon and anesthesiologist your surgery may need to be rescheduled.  Do not eat food or drink any fluids after midnight the night before surgery.  No gum chewing, lozengers or hard candies.  TAKE THESE MEDICATIONS THE MORNING OF SURGERY WITH A SIP OF WATER:  - RABEprazole (ACIPHEX) 20 MG tablet take one the night before and one on the morning of surgery - helps to prevent nausea after surgery. - fluticasone (FLONASE) 50 MCG/ACT nasal spray - Fluticasone-Salmeterol (ADVAIR) 250-50 MCG/DOSE AEPB - theophylline (UNIPHYL) 400 MG 24 hr tablet  Use inhaler albuterol (VENTOLIN HFA) 108 (90 Base) MCG/ACT inhaler on the day of surgery and bring to the hospital.  One week prior to surgery: Stop Anti-inflammatories (NSAIDS) such as Advil, Aleve, Ibuprofen, Motrin, Naproxen, Naprosyn and Aspirin based products such as Excedrin, Goodys Powder, BC Powder.  Stop ANY OVER THE COUNTER supplements until after surgery.  No Alcohol for 24 hours before or after surgery.  No Smoking including e-cigarettes for 24 hours prior to surgery.  No chewable tobacco products for at least 6 hours prior to surgery.  No nicotine patches on the day of surgery.  Do not use any "recreational" drugs for at least a week prior to your surgery.  Please be advised that the combination of cocaine and anesthesia may have negative outcomes, up to and including death. If you test positive for cocaine, your surgery will be cancelled.  On the morning of surgery brush your teeth with toothpaste and water, you may rinse your mouth with mouthwash if you wish. Do not swallow any  toothpaste or mouthwash.  Do not wear jewelry, make-up, hairpins, clips or nail polish.  Do not wear lotions, powders, or perfumes.   Do not shave body from the neck down 48 hours prior to surgery just in case you cut yourself which could leave a site for infection.  Also, freshly shaved skin may become irritated if using the CHG soap.  Contact lenses, hearing aids and dentures may not be worn into surgery.  Do not bring valuables to the hospital. [facility] is not responsible for any missing/lost belongings or valuables.   Use CHG Soap or wipes as directed on instruction sheet.  Notify your doctor if there is any change in your medical condition (cold, fever, infection).  Wear comfortable clothing (specific to your surgery type) to the hospital.  Plan for stool softeners for home use; pain medications have a tendency to cause constipation. You can also help prevent constipation by eating foods high in fiber such as fruits and vegetables and drinking plenty of fluids as your diet allows.  After surgery, you can help prevent lung complications by doing breathing exercises.  Take deep breaths and cough every 1-2 hours. Your doctor may order a device called an Incentive Spirometer to help you take deep breaths. When coughing or sneezing, hold a pillow firmly against your incision with both hands. This is called "splinting." Doing this helps protect your incision. It also decreases belly discomfort.  If you are being admitted to the hospital overnight, leave your suitcase in the car. After surgery it may be brought to your room.  If you are being discharged the day of surgery, you will not be allowed to drive home. You will need a responsible adult (18 years or older) to drive you home and stay with you that night.   If you are taking public transportation, you will need to have a responsible adult (18 years or older) with you. Please confirm with your physician that it is acceptable to  use public transportation.   Please call the [facility] Dept. at [phone] if you have any questions about these instructions.  Surgery Visitation Policy:  Patients undergoing a surgery or procedure may have one family member or support person with them as long as that person is not COVID-19 positive or experiencing its symptoms.  That person may remain in the waiting area during the procedure.  Inpatient Visitation:    Visiting hours are 7 a.m. to 8 p.m. Inpatients will be allowed two visitors daily. The visitors may change each day during the patient's stay. No visitors under the age of 84. Any visitor under the age of 48 must be accompanied by an adult. The visitor must pass COVID-19 screenings, use hand sanitizer when entering and exiting the patient's room and wear a mask at all times, including in the patient's room. Patients must also wear a mask when staff or their visitor are in the room. Masking is required regardless of vaccination status.

## 2020-06-23 ENCOUNTER — Encounter
Admission: RE | Admit: 2020-06-23 | Discharge: 2020-06-23 | Disposition: A | Discharge status: Home / Self Care | Payer: BC Managed Care – PPO | Source: Ambulatory Visit | Attending: General Surgery | Admitting: General Surgery

## 2020-06-23 DIAGNOSIS — Z20822 Contact with and (suspected) exposure to covid-19: Secondary | ICD-10-CM | POA: Diagnosis not present

## 2020-06-23 DIAGNOSIS — Z01812 Encounter for preprocedural laboratory examination: Secondary | ICD-10-CM | POA: Insufficient documentation

## 2020-06-23 LAB — COMPREHENSIVE METABOLIC PANEL
ALT: 17 U/L (ref 0–44)
AST: 19 U/L (ref 15–41)
Albumin: 3.5 g/dL (ref 3.5–5.0)
Alkaline Phosphatase: 100 U/L (ref 38–126)
Anion gap: 14 (ref 5–15)
BUN: 47 mg/dL — ABNORMAL HIGH (ref 6–20)
CO2: 12 mmol/L — ABNORMAL LOW (ref 22–32)
Calcium: 10.3 mg/dL (ref 8.9–10.3)
Chloride: 106 mmol/L (ref 98–111)
Creatinine, Ser: 1.94 mg/dL — ABNORMAL HIGH (ref 0.44–1.00)
GFR, Estimated: 31 mL/min — ABNORMAL LOW (ref >60)
Glucose, Bld: 82 mg/dL (ref 70–99)
Potassium: 2.3 mmol/L — CL (ref 3.5–5.1)
Sodium: 132 mmol/L — ABNORMAL LOW (ref 135–145)
Total Bilirubin: 0.6 mg/dL (ref 0.3–1.2)
Total Protein: 7.2 g/dL (ref 6.5–8.1)

## 2020-06-23 LAB — APTT: aPTT: 32 seconds (ref 24–36)

## 2020-06-23 LAB — PROTIME-INR
INR: 1 (ref 0.8–1.2)
Prothrombin Time: 12.8 seconds (ref 11.4–15.2)

## 2020-06-23 LAB — SARS CORONAVIRUS 2 (TAT 6-24 HRS): SARS Coronavirus 2: NEGATIVE

## 2020-06-23 NOTE — Pre-Procedure Instructions (Signed)
Patient has critical K+ OF 2.3  today, Dr. Peyton Najjar- Ferrel Logan, and Dr. Holley Raring made aware via fax with fax confirmations present.

## 2020-06-26 MED ORDER — CEFAZOLIN SODIUM-DEXTROSE 2-4 GM/100ML-% IV SOLN
2.0000 g | INTRAVENOUS | Status: AC
  Administered 2020-06-27: 2 g via INTRAVENOUS

## 2020-06-26 MED ORDER — ORAL CARE MOUTH RINSE: 15.0000 mL | Freq: Once | OROMUCOSAL | Status: AC

## 2020-06-26 MED ORDER — SODIUM CHLORIDE 0.9 % IV SOLN: INTRAVENOUS | Status: DC

## 2020-06-26 MED ORDER — INDOCYANINE GREEN 25 MG IV SOLR
1.2500 mg | Freq: Once | INTRAVENOUS | Status: AC
  Administered 2020-06-27: 1.25 mg via INTRAVENOUS
  Filled 2020-06-26: qty 10
  Filled 2020-06-26: qty 0.5

## 2020-06-26 MED ORDER — CHLORHEXIDINE GLUCONATE 0.12 % MT SOLN
15.0000 mL | Freq: Once | OROMUCOSAL | Status: AC
  Administered 2020-06-27: 15 mL via OROMUCOSAL

## 2020-06-27 ENCOUNTER — Ambulatory Visit: Payer: BC Managed Care – PPO | Admitting: Certified Registered Nurse Anesthetist

## 2020-06-27 ENCOUNTER — Encounter: Payer: Self-pay | Admitting: General Surgery

## 2020-06-27 ENCOUNTER — Encounter
Admission: RE | Disposition: A | Discharge status: Home / Self Care | Payer: Self-pay | Source: Home / Self Care | Attending: General Surgery

## 2020-06-27 ENCOUNTER — Ambulatory Visit (HOSPITAL_BASED_OUTPATIENT_CLINIC_OR_DEPARTMENT_OTHER)
Admission: RE | Admit: 2020-06-27 | Discharge: 2020-06-27 | Disposition: A | Discharge status: Home / Self Care | Payer: BC Managed Care – PPO | Source: Home / Self Care | Attending: General Surgery | Admitting: General Surgery

## 2020-06-27 ENCOUNTER — Ambulatory Visit: Admit: 2020-06-27 | Payer: BC Managed Care – PPO | Admitting: General Surgery

## 2020-06-27 DIAGNOSIS — K9189 Other postprocedural complications and disorders of digestive system: Secondary | ICD-10-CM | POA: Diagnosis not present

## 2020-06-27 DIAGNOSIS — K828 Other specified diseases of gallbladder: Secondary | ICD-10-CM | POA: Insufficient documentation

## 2020-06-27 DIAGNOSIS — Z7951 Long term (current) use of inhaled steroids: Secondary | ICD-10-CM | POA: Insufficient documentation

## 2020-06-27 DIAGNOSIS — K8010 Calculus of gallbladder with chronic cholecystitis without obstruction: Secondary | ICD-10-CM | POA: Insufficient documentation

## 2020-06-27 DIAGNOSIS — Z8249 Family history of ischemic heart disease and other diseases of the circulatory system: Secondary | ICD-10-CM | POA: Insufficient documentation

## 2020-06-27 DIAGNOSIS — E875 Hyperkalemia: Secondary | ICD-10-CM | POA: Diagnosis present

## 2020-06-27 DIAGNOSIS — Z791 Long term (current) use of non-steroidal anti-inflammatories (NSAID): Secondary | ICD-10-CM | POA: Insufficient documentation

## 2020-06-27 DIAGNOSIS — N183 Chronic kidney disease, stage 3 unspecified: Secondary | ICD-10-CM | POA: Insufficient documentation

## 2020-06-27 DIAGNOSIS — Z8049 Family history of malignant neoplasm of other genital organs: Secondary | ICD-10-CM | POA: Insufficient documentation

## 2020-06-27 DIAGNOSIS — Z888 Allergy status to other drugs, medicaments and biological substances status: Secondary | ICD-10-CM | POA: Insufficient documentation

## 2020-06-27 DIAGNOSIS — Z8349 Family history of other endocrine, nutritional and metabolic diseases: Secondary | ICD-10-CM | POA: Insufficient documentation

## 2020-06-27 DIAGNOSIS — N184 Chronic kidney disease, stage 4 (severe): Secondary | ICD-10-CM | POA: Diagnosis present

## 2020-06-27 DIAGNOSIS — Z803 Family history of malignant neoplasm of breast: Secondary | ICD-10-CM | POA: Insufficient documentation

## 2020-06-27 DIAGNOSIS — F17210 Nicotine dependence, cigarettes, uncomplicated: Secondary | ICD-10-CM | POA: Insufficient documentation

## 2020-06-27 DIAGNOSIS — Z793 Long term (current) use of hormonal contraceptives: Secondary | ICD-10-CM | POA: Insufficient documentation

## 2020-06-27 DIAGNOSIS — R109 Unspecified abdominal pain: Secondary | ICD-10-CM | POA: Diagnosis not present

## 2020-06-27 DIAGNOSIS — Z79899 Other long term (current) drug therapy: Secondary | ICD-10-CM | POA: Insufficient documentation

## 2020-06-27 LAB — POCT PREGNANCY, URINE: Preg Test, Ur: NEGATIVE

## 2020-06-27 LAB — POCT I-STAT, CHEM 8
BUN: 34 mg/dL — ABNORMAL HIGH (ref 6–20)
Calcium, Ion: 1.29 mmol/L (ref 1.15–1.40)
Chloride: 119 mmol/L — ABNORMAL HIGH (ref 98–111)
Creatinine, Ser: 2 mg/dL — ABNORMAL HIGH (ref 0.44–1.00)
Glucose, Bld: 79 mg/dL (ref 70–99)
HCT: 32 % — ABNORMAL LOW (ref 36.0–46.0)
Hemoglobin: 10.9 g/dL — ABNORMAL LOW (ref 12.0–15.0)
Potassium: 5.7 mmol/L — ABNORMAL HIGH (ref 3.5–5.1)
Sodium: 139 mmol/L (ref 135–145)
TCO2: 14 mmol/L — ABNORMAL LOW (ref 22–32)

## 2020-06-27 LAB — BASIC METABOLIC PANEL
Anion gap: 6 (ref 5–15)
BUN: 37 mg/dL — ABNORMAL HIGH (ref 6–20)
CO2: 13 mmol/L — ABNORMAL LOW (ref 22–32)
Calcium: 8.8 mg/dL — ABNORMAL LOW (ref 8.9–10.3)
Chloride: 120 mmol/L — ABNORMAL HIGH (ref 98–111)
Creatinine, Ser: 1.86 mg/dL — ABNORMAL HIGH (ref 0.44–1.00)
GFR, Estimated: 33 mL/min — ABNORMAL LOW (ref >60)
Glucose, Bld: 73 mg/dL (ref 70–99)
Potassium: 4.4 mmol/L (ref 3.5–5.1)
Sodium: 139 mmol/L (ref 135–145)

## 2020-06-27 LAB — GLUCOSE, CAPILLARY
Glucose-Capillary: 74 mg/dL (ref 70–99)
Glucose-Capillary: 123 mg/dL — ABNORMAL HIGH (ref 70–99)

## 2020-06-27 LAB — POTASSIUM: Potassium: 5.6 mmol/L — ABNORMAL HIGH (ref 3.5–5.1)

## 2020-06-27 SURGERY — CHOLECYSTECTOMY, ROBOT-ASSISTED, LAPAROSCOPIC
Anesthesia: General | Site: Abdomen

## 2020-06-27 MED ORDER — HYDROCODONE-ACETAMINOPHEN 5-325 MG PO TABS: 1.0000 | ORAL_TABLET | ORAL | 0 refills | Status: DC | PRN

## 2020-06-27 MED ORDER — FENTANYL CITRATE (PF) 100 MCG/2ML IJ SOLN
INTRAMUSCULAR | Status: DC | PRN
  Administered 2020-06-27 (×2): 50 ug via INTRAVENOUS

## 2020-06-27 MED ORDER — FENTANYL CITRATE (PF) 100 MCG/2ML IJ SOLN
INTRAMUSCULAR | Status: AC
  Filled 2020-06-27: qty 2

## 2020-06-27 MED ORDER — MEPERIDINE HCL 50 MG/ML IJ SOLN: 6.2500 mg | INTRAMUSCULAR | Status: DC | PRN

## 2020-06-27 MED ORDER — FENTANYL CITRATE (PF) 100 MCG/2ML IJ SOLN
25.0000 ug | INTRAMUSCULAR | Status: DC | PRN
  Administered 2020-06-27: 50 ug via INTRAVENOUS

## 2020-06-27 MED ORDER — ROCURONIUM BROMIDE 100 MG/10ML IV SOLN
INTRAVENOUS | Status: DC | PRN
  Administered 2020-06-27: 50 mg via INTRAVENOUS
  Administered 2020-06-27: 10 mg via INTRAVENOUS

## 2020-06-27 MED ORDER — OXYCODONE HCL 5 MG PO TABS
ORAL_TABLET | ORAL | Status: AC
  Administered 2020-06-27: 5 mg via ORAL
  Filled 2020-06-27: qty 1

## 2020-06-27 MED ORDER — MIDAZOLAM HCL 2 MG/2ML IJ SOLN
INTRAMUSCULAR | Status: AC
  Filled 2020-06-27: qty 2

## 2020-06-27 MED ORDER — DEXTROSE 50 % IV SOLN
INTRAVENOUS | Status: AC
  Filled 2020-06-27: qty 50

## 2020-06-27 MED ORDER — BUPIVACAINE-EPINEPHRINE (PF) 0.25% -1:200000 IJ SOLN
INTRAMUSCULAR | Status: AC
  Filled 2020-06-27: qty 30

## 2020-06-27 MED ORDER — DEXAMETHASONE SODIUM PHOSPHATE 10 MG/ML IJ SOLN
INTRAMUSCULAR | Status: AC
  Filled 2020-06-27: qty 1

## 2020-06-27 MED ORDER — BUPIVACAINE-EPINEPHRINE 0.25% -1:200000 IJ SOLN
INTRAMUSCULAR | Status: DC | PRN
  Administered 2020-06-27: 30 mL

## 2020-06-27 MED ORDER — DEXTROSE 50 % IV SOLN
1.0000 | Freq: Once | INTRAVENOUS | Status: AC
  Administered 2020-06-27: 50 mL via INTRAVENOUS

## 2020-06-27 MED ORDER — FENTANYL CITRATE (PF) 100 MCG/2ML IJ SOLN
INTRAMUSCULAR | Status: AC
  Administered 2020-06-27: 50 ug via INTRAVENOUS
  Filled 2020-06-27: qty 2

## 2020-06-27 MED ORDER — IPRATROPIUM-ALBUTEROL 0.5-2.5 (3) MG/3ML IN SOLN
RESPIRATORY_TRACT | Status: AC
  Filled 2020-06-27: qty 3

## 2020-06-27 MED ORDER — PROPOFOL 10 MG/ML IV BOLUS
INTRAVENOUS | Status: DC | PRN
  Administered 2020-06-27: 180 mg via INTRAVENOUS

## 2020-06-27 MED ORDER — KETOROLAC TROMETHAMINE 30 MG/ML IJ SOLN
INTRAMUSCULAR | Status: DC | PRN
  Administered 2020-06-27: 30 mg via INTRAVENOUS

## 2020-06-27 MED ORDER — DEXAMETHASONE SODIUM PHOSPHATE 10 MG/ML IJ SOLN
INTRAMUSCULAR | Status: DC | PRN
  Administered 2020-06-27: 10 mg via INTRAVENOUS

## 2020-06-27 MED ORDER — INSULIN ASPART 100 UNIT/ML IV SOLN
10.0000 [IU] | Freq: Once | INTRAVENOUS | Status: AC
  Administered 2020-06-27: 10 [IU] via INTRAVENOUS
  Filled 2020-06-27: qty 0.1

## 2020-06-27 MED ORDER — ROCURONIUM BROMIDE 10 MG/ML (PF) SYRINGE
PREFILLED_SYRINGE | INTRAVENOUS | Status: AC
  Filled 2020-06-27: qty 10

## 2020-06-27 MED ORDER — ALBUTEROL (5 MG/ML) CONTINUOUS INHALATION SOLN
5.0000 mg/h | INHALATION_SOLUTION | RESPIRATORY_TRACT | Status: DC
  Administered 2020-06-27: 5 mg/h via RESPIRATORY_TRACT
  Filled 2020-06-27: qty 20

## 2020-06-27 MED ORDER — LIDOCAINE HCL (PF) 2 % IJ SOLN
INTRAMUSCULAR | Status: AC
  Filled 2020-06-27: qty 5

## 2020-06-27 MED ORDER — ACETAMINOPHEN 10 MG/ML IV SOLN
INTRAVENOUS | Status: DC | PRN
  Administered 2020-06-27: 1000 mg via INTRAVENOUS

## 2020-06-27 MED ORDER — ACETAMINOPHEN 10 MG/ML IV SOLN
INTRAVENOUS | Status: AC
  Filled 2020-06-27: qty 100

## 2020-06-27 MED ORDER — KETOROLAC TROMETHAMINE 30 MG/ML IJ SOLN
INTRAMUSCULAR | Status: AC
  Filled 2020-06-27: qty 1

## 2020-06-27 MED ORDER — OXYCODONE HCL 5 MG PO TABS: 5.0000 mg | ORAL_TABLET | Freq: Once | ORAL | Status: AC | PRN

## 2020-06-27 MED ORDER — PROPOFOL 10 MG/ML IV BOLUS
INTRAVENOUS | Status: AC
  Filled 2020-06-27: qty 20

## 2020-06-27 MED ORDER — OXYCODONE HCL 5 MG/5ML PO SOLN
5.0000 mg | Freq: Once | ORAL | Status: AC | PRN
Start: 2020-06-27 — End: 2020-06-27

## 2020-06-27 MED ORDER — CEFAZOLIN SODIUM-DEXTROSE 2-4 GM/100ML-% IV SOLN
INTRAVENOUS | Status: AC
  Filled 2020-06-27: qty 100

## 2020-06-27 MED ORDER — ONDANSETRON HCL 4 MG/2ML IJ SOLN
INTRAMUSCULAR | Status: AC
  Filled 2020-06-27: qty 2

## 2020-06-27 MED ORDER — SUGAMMADEX SODIUM 200 MG/2ML IV SOLN
INTRAVENOUS | Status: DC | PRN
  Administered 2020-06-27: 200 mg via INTRAVENOUS

## 2020-06-27 MED ORDER — CHLORHEXIDINE GLUCONATE 0.12 % MT SOLN
OROMUCOSAL | Status: AC
  Filled 2020-06-27: qty 15

## 2020-06-27 MED ORDER — ALBUTEROL SULFATE (2.5 MG/3ML) 0.083% IN NEBU
INHALATION_SOLUTION | RESPIRATORY_TRACT | Status: AC
  Filled 2020-06-27: qty 6

## 2020-06-27 MED ORDER — MIDAZOLAM HCL 2 MG/2ML IJ SOLN
INTRAMUSCULAR | Status: DC | PRN
  Administered 2020-06-27: 2 mg via INTRAVENOUS

## 2020-06-27 MED ORDER — LIDOCAINE HCL (CARDIAC) PF 100 MG/5ML IV SOSY
PREFILLED_SYRINGE | INTRAVENOUS | Status: DC | PRN
  Administered 2020-06-27: 100 mg via INTRAVENOUS

## 2020-06-27 MED ORDER — ONDANSETRON HCL 4 MG/2ML IJ SOLN
INTRAMUSCULAR | Status: DC | PRN
  Administered 2020-06-27: 4 mg via INTRAVENOUS

## 2020-06-27 SURGICAL SUPPLY — 50 items
BAG INFUSER PRESSURE 100CC (MISCELLANEOUS) IMPLANT
BLADE SURG SZ11 CARB STEEL (BLADE) ×2 IMPLANT
CANISTER SUCT 1200ML W/VALVE (MISCELLANEOUS) ×2 IMPLANT
CANNULA REDUC XI 12-8 STAPL (CANNULA) ×1
CANNULA REDUCER 12-8 DVNC XI (CANNULA) ×1 IMPLANT
CHLORAPREP W/TINT 26 (MISCELLANEOUS) ×2 IMPLANT
CLIP VESOLOCK MED LG 6/CT (CLIP) ×2 IMPLANT
COVER WAND RF STERILE (DRAPES) ×2 IMPLANT
DECANTER SPIKE VIAL GLASS SM (MISCELLANEOUS) ×4 IMPLANT
DEFOGGER SCOPE WARMER CLEARIFY (MISCELLANEOUS) ×2 IMPLANT
DERMABOND ADVANCED (GAUZE/BANDAGES/DRESSINGS) ×1
DERMABOND ADVANCED .7 DNX12 (GAUZE/BANDAGES/DRESSINGS) ×1 IMPLANT
DRAPE ARM DVNC X/XI (DISPOSABLE) ×4 IMPLANT
DRAPE COLUMN DVNC XI (DISPOSABLE) ×1 IMPLANT
DRAPE DA VINCI XI ARM (DISPOSABLE) ×4
DRAPE DA VINCI XI COLUMN (DISPOSABLE) ×1
ELECT REM PT RETURN 9FT ADLT (ELECTROSURGICAL) ×2
ELECTRODE REM PT RTRN 9FT ADLT (ELECTROSURGICAL) ×1 IMPLANT
GLOVE SURG ENC MOIS LTX SZ6.5 (GLOVE) ×4 IMPLANT
GLOVE SURG UNDER POLY LF SZ6.5 (GLOVE) ×4 IMPLANT
GOWN STRL REUS W/ TWL LRG LVL3 (GOWN DISPOSABLE) ×3 IMPLANT
GOWN STRL REUS W/TWL LRG LVL3 (GOWN DISPOSABLE) ×3
GRASPER SUT TROCAR 14GX15 (MISCELLANEOUS) ×2 IMPLANT
IRRIGATOR SUCT 8 DISP DVNC XI (IRRIGATION / IRRIGATOR) IMPLANT
IRRIGATOR SUCTION 8MM XI DISP (IRRIGATION / IRRIGATOR)
IV NS 1000ML (IV SOLUTION)
IV NS 1000ML BAXH (IV SOLUTION) IMPLANT
KIT PINK PAD W/HEAD ARE REST (MISCELLANEOUS) ×2
KIT PINK PAD W/HEAD ARM REST (MISCELLANEOUS) ×1 IMPLANT
LABEL OR SOLS (LABEL) ×2 IMPLANT
MANIFOLD NEPTUNE II (INSTRUMENTS) ×2 IMPLANT
NEEDLE HYPO 22GX1.5 SAFETY (NEEDLE) ×2 IMPLANT
NEEDLE INSUFFLATION 14GA 120MM (NEEDLE) ×2 IMPLANT
NS IRRIG 500ML POUR BTL (IV SOLUTION) ×2 IMPLANT
OBTURATOR OPTICAL STANDARD 8MM (TROCAR) ×1
OBTURATOR OPTICAL STND 8 DVNC (TROCAR) ×1
OBTURATOR OPTICALSTD 8 DVNC (TROCAR) ×1 IMPLANT
PACK LAP CHOLECYSTECTOMY (MISCELLANEOUS) ×2 IMPLANT
POUCH SPECIMEN RETRIEVAL 10MM (ENDOMECHANICALS) ×2 IMPLANT
SEAL CANN UNIV 5-8 DVNC XI (MISCELLANEOUS) ×3 IMPLANT
SEAL XI 5MM-8MM UNIVERSAL (MISCELLANEOUS) ×3
SET TUBE SMOKE EVAC HIGH FLOW (TUBING) ×2 IMPLANT
SOLUTION ELECTROLUBE (MISCELLANEOUS) ×2 IMPLANT
SPONGE LAP 4X18 RFD (DISPOSABLE) IMPLANT
STAPLER CANNULA SEAL DVNC XI (STAPLE) ×1 IMPLANT
STAPLER CANNULA SEAL XI (STAPLE) ×1
SUT MNCRL 4-0 (SUTURE) ×1
SUT MNCRL 4-0 27XMFL (SUTURE) ×1
SUT VICRYL 0 AB UR-6 (SUTURE) ×2 IMPLANT
SUTURE MNCRL 4-0 27XMF (SUTURE) ×1 IMPLANT

## 2020-06-27 SURGICAL SUPPLY — 56 items
ADH SKN CLS APL DERMABOND .7 (GAUZE/BANDAGES/DRESSINGS) ×1
APL PRP STRL LF DISP 70% ISPRP (MISCELLANEOUS) ×1
BAG INFUSER PRESSURE 100CC (MISCELLANEOUS) IMPLANT
BAG SPEC RTRVL LRG 6X4 10 (ENDOMECHANICALS) ×1
BLADE SURG SZ11 CARB STEEL (BLADE) ×2 IMPLANT
CANISTER SUCT 1200ML W/VALVE (MISCELLANEOUS) ×2 IMPLANT
CANNULA REDUC XI 12-8 STAPL (CANNULA) ×1
CANNULA REDUCER 12-8 DVNC XI (CANNULA) ×1 IMPLANT
CHLORAPREP W/TINT 26 (MISCELLANEOUS) ×2 IMPLANT
CLIP VESOLOCK MED LG 6/CT (CLIP) ×2 IMPLANT
COVER LIGHT HANDLE STERIS (MISCELLANEOUS) ×2 IMPLANT
COVER WAND RF STERILE (DRAPES) ×2 IMPLANT
DECANTER SPIKE VIAL GLASS SM (MISCELLANEOUS) ×4 IMPLANT
DEFOGGER SCOPE WARMER CLEARIFY (MISCELLANEOUS) ×2 IMPLANT
DERMABOND ADVANCED (GAUZE/BANDAGES/DRESSINGS) ×1
DERMABOND ADVANCED .7 DNX12 (GAUZE/BANDAGES/DRESSINGS) ×1 IMPLANT
DRAPE ARM DVNC X/XI (DISPOSABLE) ×4 IMPLANT
DRAPE COLUMN DVNC XI (DISPOSABLE) ×1 IMPLANT
DRAPE DA VINCI XI ARM (DISPOSABLE) ×4
DRAPE DA VINCI XI COLUMN (DISPOSABLE) ×1
ELECT REM PT RETURN 9FT ADLT (ELECTROSURGICAL) ×2
ELECTRODE REM PT RTRN 9FT ADLT (ELECTROSURGICAL) ×1 IMPLANT
GLOVE SURG ENC MOIS LTX SZ6.5 (GLOVE) ×4 IMPLANT
GLOVE SURG UNDER POLY LF SZ6.5 (GLOVE) ×6 IMPLANT
GOWN STRL REUS W/ TWL LRG LVL3 (GOWN DISPOSABLE) ×4 IMPLANT
GOWN STRL REUS W/TWL LRG LVL3 (GOWN DISPOSABLE) ×8
GRASPER SUT TROCAR 14GX15 (MISCELLANEOUS) ×2 IMPLANT
IRRIGATOR SUCT 8 DISP DVNC XI (IRRIGATION / IRRIGATOR) IMPLANT
IRRIGATOR SUCTION 8MM XI DISP (IRRIGATION / IRRIGATOR)
IV NS 1000ML (IV SOLUTION)
IV NS 1000ML BAXH (IV SOLUTION) IMPLANT
KIT PINK PAD W/HEAD ARE REST (MISCELLANEOUS) ×2
KIT PINK PAD W/HEAD ARM REST (MISCELLANEOUS) ×1 IMPLANT
LABEL OR SOLS (LABEL) ×2 IMPLANT
MANIFOLD NEPTUNE II (INSTRUMENTS) ×2 IMPLANT
NEEDLE HYPO 22GX1.5 SAFETY (NEEDLE) ×2 IMPLANT
NEEDLE INSUFFLATION 14GA 120MM (NEEDLE) ×2 IMPLANT
NS IRRIG 500ML POUR BTL (IV SOLUTION) ×2 IMPLANT
OBTURATOR OPTICAL STANDARD 8MM (TROCAR) ×1
OBTURATOR OPTICAL STND 8 DVNC (TROCAR) ×1
OBTURATOR OPTICALSTD 8 DVNC (TROCAR) ×1 IMPLANT
PACK LAP CHOLECYSTECTOMY (MISCELLANEOUS) ×2 IMPLANT
POUCH SPECIMEN RETRIEVAL 10MM (ENDOMECHANICALS) ×2 IMPLANT
SEAL CANN UNIV 5-8 DVNC XI (MISCELLANEOUS) ×3 IMPLANT
SEAL XI 5MM-8MM UNIVERSAL (MISCELLANEOUS) ×3
SET TUBE SMOKE EVAC HIGH FLOW (TUBING) ×2 IMPLANT
SOLUTION ELECTROLUBE (MISCELLANEOUS) ×2 IMPLANT
SPONGE LAP 4X18 RFD (DISPOSABLE) ×2 IMPLANT
STAPLER CANNULA SEAL DVNC XI (STAPLE) ×1 IMPLANT
STAPLER CANNULA SEAL XI (STAPLE) ×1
SUT MNCRL 4-0 (SUTURE) ×2
SUT MNCRL 4-0 27XMFL (SUTURE) ×1
SUT VIC AB 2-0 SH 27 (SUTURE) ×2
SUT VIC AB 2-0 SH 27XBRD (SUTURE) ×1 IMPLANT
SUT VICRYL 0 AB UR-6 (SUTURE) ×2 IMPLANT
SUTURE MNCRL 4-0 27XMF (SUTURE) ×1 IMPLANT

## 2020-06-27 NOTE — Progress Notes (Signed)
Per Dr. Randa Lynn no need for UDS, pt denies any drug use other than marijuana

## 2020-06-27 NOTE — Op Note (Addendum)
Preoperative diagnosis: Symptomatic cholelithiasis  Postoperative diagnosis: Symptomatic cholelithiasis  Procedure: Robotic Assisted Laparoscopic Cholecystectomy.   Anesthesia: GETA   Surgeon: Dr. Windell Moment  Wound Classification: Clean Contaminated  Indications: Patient is a 50 y.o. female developed right upper quadrant pain and on workup was found to have distended gallbladder with sludge with a normal common duct and history of biliary pancreatitis. Robotic Assisted Laparoscopic cholecystectomy was elected.  Findings: Cirrhotic liver with small amount of ascitis upon entering the abdomen.    Critical view of safety achieved   Cystic duct and artery identified, ligated and divided Adequate hemostasis  Description of procedure: The patient was placed on the operating table in the supine position. General anesthesia was induced. A time-out was completed verifying correct patient, procedure, site, positioning, and implant(s) and/or special equipment prior to beginning this procedure. An orogastric tube was placed. The abdomen was prepped and draped in the usual sterile fashion.  An incision was made in a natural skin line below the umbilicus.  The fascia was elevated and the Veress needle inserted. Proper position was confirmed by aspiration and saline meniscus test.  The abdomen was insufflated with carbon dioxide to a pressure of 15 mmHg. The patient tolerated insufflation well. A 8-mm trocar was then inserted in optiview fashion.  The laparoscope was inserted and the abdomen inspected. No injuries from initial trocar placement were noted. Additional trocars were then inserted in the following locations: an 8-mm trocar in the left lateral abdomen, and another two 8-mm trocars to the right side of the abdomen 5 cm appart. The umbilical trocar was changed to a 12 mm trocar all under direct visualization. The abdomen was inspected and no abnormalities were found. The table was placed in  the reverse Trendelenburg position with the right side up. The robotic arms were docked and target anatomy identified. Instrument inserted under direct visualization.  Filmy adhesions between the gallbladder and omentum, duodenum and transverse colon were lysed with electrocautery. The dome of the gallbladder was grasped with a prograsp and retracted over the dome of the liver. The infundibulum was also grasped with an atraumatic grasper and retracted toward the right lower quadrant. This maneuver exposed Calot's triangle. The peritoneum overlying the gallbladder infundibulum was then incised and the cystic duct and cystic artery identified and circumferentially dissected. Critical view of safety reviewed before ligating any structure. Firefly images taken to visualize biliary ducts. The cystic duct and cystic artery were then doubly clipped and divided close to the gallbladder.  The gallbladder was then dissected from its peritoneal attachments by electrocautery. Hemostasis was checked and the gallbladder and contained stones were removed using an endoscopic retrieval bag. The gallbladder was passed off the table as a specimen. There was no evidence of bleeding from the gallbladder fossa or cystic artery or leakage of the bile from the cystic duct stump. Secondary trocars were removed under direct vision. No bleeding was noted. The robotic arms were undoked. The scope was withdrawn and the umbilical trocar removed. The abdomen was allowed to collapse. The fascia of the 61m trocar sites was closed with figure-of-eight 0 vicryl sutures. The skin was closed with subcuticular sutures of 4-0 monocryl and topical skin adhesive. The orogastric tube was removed.  The patient tolerated the procedure well and was taken to the postanesthesia care unit in stable condition.   Specimen: Gallbladder  Complications: None  EBL: 5 mL

## 2020-06-27 NOTE — Consult Note (Signed)
[facility] Medical Consultation  Katherine Perez [id number] DOB: [birth year] DOA: 06/27/2020 PCP: Gladstone Lighter, MD   Requesting physician: Dr Windell Moment Date of consultation: 06/27/20 Reason for consultation: Hyperkalemia  Impression/Recommendations Active Problems:   Hyperkalemia   CKD (chronic kidney disease) stage 3, GFR 30-59 ml/min ([place])    1. Hyperkalemia Patient noted to have hyperkalemia with serum potassium levels of 5.7.  Hyperkalemia appears to be iatrogenic as patient usually has hypokalemia and was started on potassium supplements over the weekend in anticipation for planned surgery.  Patient was prescribed 20 mEq of potassium 3 times a day but she states that instead of 3 times a day she took it 4 times a day. She also admits to eating a lot of potassium containing foods.  Patient arrived for surgery and labs were drawn and she was noted to have a potassium level of 5.7. She is completely asymptomatic and has a normal twelve-lead EKG Medical consult was requested for treatment of hyperkalemia. I have ordered dextrose, insulin and albuterol as well as repeat potassium levels at 3 pm and discussed the treatment with patient who declines at this time stating that unless she is going to have surgery today there is no need to lower her potassium levels because she usually runs low. Consulting surgeon informed   2.  Stage 3 chronic kidney disease Stable  I will followup again tomorrow. Please contact me if I can be of assistance in the meanwhile. Thank you for this consultation.  Chief Complaint: Abnormal labs  HPI:  Patient is a 50 year old female who presented to same-day surgery for planned cholecystectomy for distended gallbladder.  She has a history of hypokalemia and potassium supplements were ordered over the weekend. Patient was prescribed potassium 20 mEq 3 times a day but instead she took 20 mEq 4 times a day and took a supplement on the day of  surgery.  She also admits to eating a lot of potassium containing food.  Potassium level is elevated at 5.7 but she is asymptomatic and has no EKG changes. Medical consult was requested for treatment of hyperkalemia. She denies having any chest pain, no shortness of breath, no nausea, no vomiting, no abdominal pain, no constipation, no diarrhea, no diaphoresis, no palpitations, no fever, no chills, no urinary frequency, no nocturia, no dysuria, no hematuria, no hematemesis, no hematochezia, no melena stools, no blurred vision, no cough, no focal deficits.  Review of Systems: As per HPI otherwise all other systems reviewed and negative.   Past Medical History:  Diagnosis Date  . Anemia   . Asthma   . CKD (chronic kidney disease)   . ETOH abuse   . GERD (gastroesophageal reflux disease)   . IC (interstitial cystitis)   . Laxative abuse   . Neuropathy   . Pneumonia   . Raynaud disease    Past Surgical History:  Procedure Laterality Date  . COLONOSCOPY    . ESOPHAGOGASTRODUODENOSCOPY    . FRACTURE SURGERY     femur fracture left - 2019  . LEG SURGERY     Social History:  reports that she has been smoking. She has never used smokeless tobacco. She reports current alcohol use. She reports current drug use. Drug: Marijuana.  Allergies  Allergen Reactions  . Cetirizine Hives  . Diazepam Other (See Comments)    Depression   . Baclofen Anxiety and Other (See Comments)    AMS - "Really messed me up, caused me to drop things"    Family  History  Problem Relation Age of Onset  . Other Neg Hx     Prior to Admission medications   Medication Sig Start Date End Date Taking? Authorizing Provider  albuterol (VENTOLIN HFA) 108 (90 Base) MCG/ACT inhaler Inhale 2 puffs into the lungs every 4 (four) hours as needed for shortness of breath or wheezing. 06/07/19  Yes [provider]  amitriptyline (ELAVIL) 25 MG tablet Take 25-50 mg by mouth at bedtime. 06/13/20  Yes [provider]  Brimonidine Tartrate 0.025 % SOLN Place 1 drop into both eyes daily.   Yes [provider]  Cholecalciferol (VITAMIN D) 125 MCG (5000 UT) CAPS Take 5,000 Units by mouth daily.   Yes [provider]  docusate sodium (COLACE) 100 MG capsule Take 100 mg by mouth 2 (two) times daily.   Yes [provider]  famotidine (PEPCID) 40 MG tablet Take 40 mg by mouth at bedtime. 10/07/19  Yes [provider]  fexofenadine (ALLEGRA) 180 MG tablet Take 180 mg by mouth daily as needed for allergies.   Yes [provider]  FIBER PO Take 1 capsule by mouth daily.   Yes [provider]  fluticasone (FLONASE) 50 MCG/ACT nasal spray Place 2 sprays into both nostrils 2 (two) times daily.  09/14/19  Yes [provider]  Fluticasone-Salmeterol (ADVAIR) 250-50 MCG/DOSE AEPB Inhale 1 puff into the lungs 2 (two) times daily. 09/14/19  Yes [provider]  gabapentin (NEURONTIN) 300 MG capsule Take 1 capsule (300 mg total) by mouth 3 (three) times daily. Patient taking differently: Take 300 mg by mouth See admin instructions. Take 300 mg by mouth in the morning, 900 mg in the afternoon and 300 mg at bedtime 04/26/20  Yes Fritzi Mandes, MD  hydrOXYzine (ATARAX/VISTARIL) 25 MG tablet Take 25-50 mg by mouth at bedtime.   Yes [provider]  Ibuprofen-Acetaminophen (ADVIL DUAL ACTION) 125-250 MG TABS Take 2 tablets by mouth daily as needed (pain/headache).   Yes [provider]  medroxyPROGESTERone (DEPO-PROVERA) 150 MG/ML injection Inject 150 mg into the muscle every 3 (three) months.   Yes [provider]  metoCLOPramide (REGLAN) 10 MG tablet Take 10 mg by mouth in the morning and at bedtime. 10/17/19  Yes [provider]  ondansetron (ZOFRAN) 8 MG tablet Take 8 mg by mouth every 8 (eight) hours as needed for vomiting or nausea. 10/23/19  Yes [provider]  oxybutynin (DITROPAN-XL) 10 MG 24 hr tablet Take  10 mg by mouth daily at 12 noon. 04/17/20  Yes [provider]  pentosan polysulfate (ELMIRON) 100 MG capsule Take 100 mg by mouth at bedtime.   Yes [provider]  promethazine (PHENERGAN) 25 MG tablet Take 25 mg by mouth every 8 (eight) hours as needed for nausea or vomiting.  10/23/19  Yes [provider]  RABEprazole (ACIPHEX) 20 MG tablet Take 20 mg by mouth in the morning and at bedtime.  10/12/19  Yes [provider]  sodium citrate-citric acid (ORACIT) 500-334 MG/5ML solution Take 15 mLs by mouth in the morning and at bedtime. 05/30/20  Yes [provider]  tamsulosin (FLOMAX) 0.4 MG CAPS capsule Take 0.4 mg by mouth at bedtime. 10/10/19  Yes [provider]  theophylline (UNIPHYL) 400 MG 24 hr tablet Take 400 mg by mouth 2 (two) times daily.  10/10/19  Yes [provider]  traMADol (ULTRAM) 50 MG tablet Take 50 mg by mouth every 6 (six) hours as needed for moderate pain.  Yes [provider]  trimethoprim (TRIMPEX) 100 MG tablet Take 100 mg by mouth daily. 03/28/20  Yes [provider]  TRULANCE 3 MG TABS Take 3 mg by mouth daily at 6 (six) AM.  10/12/19  Yes [provider]  acidophilus (RISAQUAD) CAPS capsule Take by mouth daily. Patient not taking: Reported on 06/27/2020    [provider]  Diclofenac-miSOPROStol 75-0.2 MG TBEC Take 1 tablet by mouth 2 (two) times daily. Patient not taking: No sig reported 04/05/20   [provider]  Fluticasone-Salmeterol (ADVAIR) 100-50 MCG/DOSE AEPB Inhale 1 puff into the lungs 2 (two) times daily. Patient not taking: No sig reported 09/14/19   [provider]  magnesium oxide (MAG-OX) 400 (241.3 Mg) MG tablet Take 1 tablet (400 mg total) by mouth daily. Patient not taking: No sig reported 04/27/20   Fritzi Mandes, MD  potassium chloride SA (KLOR-CON) 20 MEQ tablet Take 2 tablets (40 mEq total) by mouth daily. Patient not taking: No sig reported  04/27/20   Fritzi Mandes, MD  thiamine 100 MG tablet Take 1 tablet (100 mg total) by mouth daily. Patient not taking: No sig reported 11/03/19   Bonnielee Haff, MD   Physical Exam: Blood pressure 126/63, pulse 85, temperature 98.8 F (37.1 C), temperature source Oral, resp. rate 16, height '5\' 9"'$  (1.753 m), weight 59 kg, SpO2 100 %. Vitals:   06/27/20 1050  BP: 126/63  Pulse: 85  Resp: 16  Temp: 98.8 F (37.1 C)  SpO2: 100%     General: Patient is lying in bed.  She appears comfortable and in no distress  Eyes: No conjunctival pallor  ENT: Normal  Neck: Supple, no JVD  Cardiovascular: Regular rate and rhythm S1, S2  Respiratory: Clear to auscultation bilaterally  Abdomen: Bowel sounds are present, soft, nontender, nondistended  Skin: Warm and dry  Musculoskeletal: Normal range of motion  Psychiatric: Normal affect and mood  Neurologic: Able to move all extremities  Labs on Admission:  Basic Metabolic Panel: Recent Labs  Lab 06/23/20 1000 06/27/20 1042 06/27/20 1047 06/27/20 1335  NA 132* 139  --  139  K 2.3* 5.7* 5.6* 4.4  CL 106 119*  --  120*  CO2 12*  --   --  13*  GLUCOSE 82 79  --  73  BUN 47* 34*  --  37*  CREATININE 1.94* 2.00*  --  1.86*  CALCIUM 10.3  --   --  8.8*   Liver Function Tests: Recent Labs  Lab 06/23/20 1000  AST 19  ALT 17  ALKPHOS 100  BILITOT 0.6  PROT 7.2  ALBUMIN 3.5   No results for input(s): LIPASE, AMYLASE in the last 168 hours. No results for input(s): AMMONIA in the last 168 hours. CBC: Recent Labs  Lab 06/27/20 1042  HGB 10.9*  HCT 32.0*   Cardiac Enzymes: No results for input(s): CKTOTAL, CKMB, CKMBINDEX, TROPONINI in the last 168 hours. BNP: Invalid input(s): POCBNP CBG: Recent Labs  Lab 06/27/20 [id number]    Radiological Exams on Admission: No results found.  EKG: Independently reviewed.  Normal sinus rhythm Normal EKG  Time spent: 55  [facility] Pager  [phone]  If 7PM-7AM, please contact night-coverage [email or web address] Password [facility] 06/27/2020, 2:29 PM

## 2020-06-27 NOTE — Interval H&P Note (Signed)
History and Physical Interval Note:  06/27/2020 2:17 PM  Katherine Perez  has presented today for surgery, with the diagnosis of K80.20Calculus of gallbladder w/o cholecystitis or obstruction.  The various methods of treatment have been discussed with the patient and family. After consideration of risks, benefits and other options for treatment, the patient has consented to  Procedure(s): XI ROBOTIC [place] (N/A) as a surgical intervention.  The patient's history has been reviewed, patient examined, no change in status, stable for surgery.  I have reviewed the patient's chart and labs.  Potassium corrected to 4.4. will proceed with cholecystectomy. Questions were answered to the patient's satisfaction.     Herbert Pun

## 2020-06-27 NOTE — Progress Notes (Signed)
Pt refused IV anywhere but AC and only one. Pt also refused SCDs on at this time. Will make OR nurse aware

## 2020-06-27 NOTE — Anesthesia Procedure Notes (Signed)
Procedure Name: Intubation Date/Time: 06/27/2020 2:55 PM Performed by: Johnna Acosta, CRNA Pre-anesthesia Checklist: Patient identified, Emergency Drugs available, Suction available, Patient being monitored and Timeout performed Patient Re-evaluated:Patient Re-evaluated prior to induction Oxygen Delivery Method: Circle system utilized Preoxygenation: Pre-oxygenation with 100% oxygen Induction Type: IV induction Ventilation: Mask ventilation without difficulty Laryngoscope Size: McGraph and 3 Grade View: Grade I Tube type: Oral Tube size: 7.0 mm Number of attempts: 1 Airway Equipment and Method: Stylet and Video-laryngoscopy Placement Confirmation: ETT inserted through vocal cords under direct vision,  positive ETCO2 and breath sounds checked- equal and bilateral Secured at: 21 cm Tube secured with: Tape Dental Injury: Teeth and Oropharynx as per pre-operative assessment

## 2020-06-27 NOTE — Progress Notes (Signed)
Pt K 5.7, per pt was told to take 64mQ daily and has been taking 80. States she took one this am. Dr. PRanda Lynnmade aware, blood sent to lab and EKG obtained

## 2020-06-27 NOTE — Progress Notes (Signed)
Pt has noted cup of ice near bedside in her walker. Pt denies drinking anything other than sips of water with meds at home. This RN informed pt that she was not to drink anything prior to surgery and not to drink anything from cup. Pt verbalizes agreement.

## 2020-06-27 NOTE — Anesthesia Preprocedure Evaluation (Signed)
Anesthesia Evaluation  Patient identified by MRN, date of birth, ID band Patient awake    Reviewed: Allergy & Precautions, NPO status , Patient's Chart, lab work & pertinent test results  History of Anesthesia Complications Negative for: history of anesthetic complications  Airway Mallampati: II  TM Distance: >3 FB Neck ROM: Full    Dental no notable dental hx.    Pulmonary asthma , Current Smoker,    breath sounds clear to auscultation- rhonchi (-) wheezing      Cardiovascular Exercise Tolerance: Good (-) hypertension(-) CAD, (-) Past MI, (-) Cardiac Stents and (-) CABG  Rhythm:Regular Rate:Normal - Systolic murmurs and - Diastolic murmurs    Neuro/Psych neg Seizures PSYCHIATRIC DISORDERS (hx of EtOH abuse) negative neurological ROS     GI/Hepatic Neg liver ROS, GERD  ,  Endo/Other  negative endocrine ROSneg diabetes  Renal/GU CRFRenal disease     Musculoskeletal negative musculoskeletal ROS (+)   Abdominal (+) - obese,   Peds  Hematology  (+) anemia ,   Anesthesia Other Findings Past Medical History: No date: Anemia No date: Asthma No date: CKD (chronic kidney disease) No date: ETOH abuse No date: GERD (gastroesophageal reflux disease) No date: IC (interstitial cystitis) 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 No date: Neuropathy No date: Pneumonia No date: Raynaud disease   Reproductive/Obstetrics                             Anesthesia Physical  Anesthesia Plan  ASA: III  Anesthesia Plan: General   Post-op Pain Management:    Induction: Intravenous  PONV Risk Score and Plan: 1  Airway Management Planned: Oral ETT  Additional Equipment:   Intra-op Plan:   Post-operative Plan: Extubation in OR  Informed Consent: I have reviewed the patients History and Physical, chart, labs and discussed the procedure including the risks, benefits and alternatives for the proposed anesthesia with  the patient or authorized representative who has indicated his/her understanding and acceptance.     Dental advisory given  Plan Discussed with: CRNA and Anesthesiologist  Anesthesia Plan Comments:         Anesthesia Quick Evaluation

## 2020-06-27 NOTE — Discharge Instructions (Addendum)
  Diet: Resume home heart healthy regular diet.   Activity: No heavy lifting >20 pounds (children, pets, laundry, garbage) or strenuous activity until follow-up, but light activity and walking are encouraged. Do not drive or drink alcohol if taking narcotic pain medications.  Wound care: May shower with soapy water and pat dry (do not rub incisions), but no baths or submerging incision underwater until follow-up. (no swimming)   Medications: Resume all home medications. For mild to moderate pain: acetaminophen (Tylenol) or ibuprofen (if no kidney disease). Combining Tylenol with alcohol can substantially increase your risk of causing liver disease. Narcotic pain medications, if prescribed, can be used for severe pain, though may cause nausea, constipation, and drowsiness. Do not combine Tylenol and Norco within a 6 hour period as Norco contains Tylenol. If you do not need the narcotic pain medication, you do not need to fill the prescription.       (TYLENOL 1,000 mg GIVEN AT [place] AT 3:11 PM.   TORADOL (anti-inflammatory medicine) GIVEN AT [employer] AT 3:48 PM.  MAY HAVE IBUPROFEN AT 9:48 PM IF NEEDED.)  Call office [phone]) at any time if any questions, worsening pain, fevers/chills, bleeding, drainage from incision site, or other concerns.   AMBULATORY SURGERY  DISCHARGE INSTRUCTIONS   1) The drugs that you were given will stay in your system until tomorrow so for the next 24 hours you should not:  A) Drive an automobile B) Make any legal decisions C) Drink any alcoholic beverage   2) You may resume regular meals tomorrow.  Today it is better to start with liquids and gradually work up to solid foods.  You may eat anything you prefer, but it is better to start with liquids, then soup and crackers, and gradually work up to solid foods.   3) Please notify your doctor immediately if you have any unusual bleeding, trouble breathing, redness and pain at the surgery site,  drainage, fever, or pain not relieved by medication.    4) Additional Instructions:        Please contact your physician with any problems or Same Day Surgery at [phone], Monday through Friday 6 am to 4 pm, or [place] at [facility] number at [phone].

## 2020-06-27 NOTE — Anesthesia Preprocedure Evaluation (Signed)
Anesthesia Evaluation  Patient identified by MRN, date of birth, ID band Patient awake    Reviewed: Allergy & Precautions, NPO status , Patient's Chart, lab work & pertinent test results  History of Anesthesia Complications Negative for: history of anesthetic complications  Airway Mallampati: II  TM Distance: >3 FB Neck ROM: Full    Dental no notable dental hx.    Pulmonary asthma , Current Smoker,    breath sounds clear to auscultation- rhonchi (-) wheezing      Cardiovascular Exercise Tolerance: Good (-) hypertension(-) CAD, (-) Past MI, (-) Cardiac Stents and (-) CABG  Rhythm:Regular Rate:Normal - Systolic murmurs and - Diastolic murmurs    Neuro/Psych neg Seizures PSYCHIATRIC DISORDERS (hx of EtOH abuse) negative neurological ROS     GI/Hepatic Neg liver ROS, GERD  ,  Endo/Other  negative endocrine ROSneg diabetes  Renal/GU CRFRenal disease     Musculoskeletal negative musculoskeletal ROS (+)   Abdominal (+) - obese,   Peds  Hematology  (+) anemia ,   Anesthesia Other Findings Past Medical History: No date: Anemia No date: Asthma No date: CKD (chronic kidney disease) No date: ETOH abuse No date: GERD (gastroesophageal reflux disease) No date: IC (interstitial cystitis) 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 No date: Neuropathy No date: Pneumonia No date: Raynaud disease   Reproductive/Obstetrics                             Anesthesia Physical Anesthesia Plan  ASA: III  Anesthesia Plan: General   Post-op Pain Management:    Induction: Intravenous  PONV Risk Score and Plan: 1  Airway Management Planned: Oral ETT  Additional Equipment:   Intra-op Plan:   Post-operative Plan: Extubation in OR  Informed Consent: I have reviewed the patients History and Physical, chart, labs and discussed the procedure including the risks, benefits and alternatives for the proposed anesthesia with  the patient or authorized representative who has indicated his/her understanding and acceptance.     Dental advisory given  Plan Discussed with: CRNA and Anesthesiologist  Anesthesia Plan Comments:         Anesthesia Quick Evaluation

## 2020-06-27 NOTE — Transfer of Care (Signed)
Immediate Anesthesia Transfer of Care Note  Patient: Katherine Perez  Procedure(s) Performed: XI ROBOTIC ASSISTED LAPAROSCOPIC CHOLECYSTECTOMY (N/A Abdomen)  Patient Location: PACU  Anesthesia Type:General  Level of Consciousness: drowsy and patient cooperative  Airway & Oxygen Therapy: Patient Spontanous Breathing and Patient connected to face mask oxygen  Post-op Assessment: Report given to RN and Post -op Vital signs reviewed and stable  Post vital signs: Reviewed and stable  Last Vitals:  Vitals Value Taken Time  BP 138/78 06/27/20 1618  Temp 36.8 C 06/27/20 1618  Pulse 96 06/27/20 1618  Resp 13 06/27/20 1618  SpO2 100 % 06/27/20 1618    Last Pain:  Vitals:   06/27/20 1618  TempSrc:   PainSc: Asleep         Complications: No complications documented.

## 2020-06-27 NOTE — Interval H&P Note (Signed)
History and Physical Interval Note:  06/27/2020 11:12 AM  Katherine Perez  has presented today for surgery, with the diagnosis of K80.20Calculus of gallbladder w/o cholecystitis or obstruction.  The various methods of treatment have been discussed with the patient and family. After consideration of risks, benefits and other options for treatment, the patient has consented to  Procedure(s): XI ROBOTIC [place] (N/A) as a surgical intervention.  The patient's history has been reviewed, patient examined, no change in status, stable for surgery.  I have reviewed the patient's chart and labs.  Potassium today was 5.7. Decision was to cancel the surgery and send patient to PCP for management of Potassium abnormalities.    Herbert Pun

## 2020-06-28 NOTE — Progress Notes (Signed)
Did not leave message for call back as name was not in voicemail message.

## 2020-06-28 NOTE — Anesthesia Postprocedure Evaluation (Signed)
Anesthesia Post Note  Patient: Patsey Berthold  Procedure(s) Performed: XI ROBOTIC ASSISTED LAPAROSCOPIC CHOLECYSTECTOMY (N/A Abdomen)  Patient location during evaluation: PACU Anesthesia Type: General Level of consciousness: oriented and awake and alert Pain management: pain level controlled Vital Signs Assessment: post-procedure vital signs reviewed and stable Respiratory status: spontaneous breathing Cardiovascular status: blood pressure returned to baseline Anesthetic complications: no   No complications documented.   Last Vitals:  Vitals:   06/27/20 1700 06/27/20 1713  BP: 108/61 107/82  Pulse: 80 84  Resp: 12 16  Temp: 36.4 C (!) 36.1 C  SpO2: 95% 100%    Last Pain:  Vitals:   06/27/20 1713  TempSrc: Temporal  PainSc: 5                  Abdirizak Richison

## 2020-06-29 ENCOUNTER — Emergency Department: Payer: BC Managed Care – PPO

## 2020-06-29 ENCOUNTER — Inpatient Hospital Stay
Admission: EM | Admit: 2020-06-29 | Discharge: 2020-07-06 | DRG: 357 | Disposition: A | Discharge status: Home / Self Care | Payer: BC Managed Care – PPO | Attending: Student | Admitting: Student

## 2020-06-29 ENCOUNTER — Other Ambulatory Visit: Payer: Self-pay

## 2020-06-29 DIAGNOSIS — K819 Cholecystitis, unspecified: Secondary | ICD-10-CM | POA: Diagnosis present

## 2020-06-29 DIAGNOSIS — K9189 Other postprocedural complications and disorders of digestive system: Principal | ICD-10-CM | POA: Diagnosis present

## 2020-06-29 DIAGNOSIS — K839 Disease of biliary tract, unspecified: Secondary | ICD-10-CM | POA: Diagnosis present

## 2020-06-29 DIAGNOSIS — B961 Klebsiella pneumoniae [K. pneumoniae] as the cause of diseases classified elsewhere: Secondary | ICD-10-CM | POA: Diagnosis present

## 2020-06-29 DIAGNOSIS — Z803 Family history of malignant neoplasm of breast: Secondary | ICD-10-CM

## 2020-06-29 DIAGNOSIS — Z8049 Family history of malignant neoplasm of other genital organs: Secondary | ICD-10-CM

## 2020-06-29 DIAGNOSIS — Z20822 Contact with and (suspected) exposure to covid-19: Secondary | ICD-10-CM | POA: Diagnosis present

## 2020-06-29 DIAGNOSIS — K828 Other specified diseases of gallbladder: Secondary | ICD-10-CM | POA: Diagnosis present

## 2020-06-29 DIAGNOSIS — Z87440 Personal history of urinary (tract) infections: Secondary | ICD-10-CM

## 2020-06-29 DIAGNOSIS — N183 Chronic kidney disease, stage 3 unspecified: Secondary | ICD-10-CM | POA: Diagnosis not present

## 2020-06-29 DIAGNOSIS — R188 Other ascites: Secondary | ICD-10-CM | POA: Diagnosis present

## 2020-06-29 DIAGNOSIS — Z9889 Other specified postprocedural states: Secondary | ICD-10-CM | POA: Diagnosis not present

## 2020-06-29 DIAGNOSIS — A419 Sepsis, unspecified organism: Secondary | ICD-10-CM | POA: Insufficient documentation

## 2020-06-29 DIAGNOSIS — I7300 Raynaud's syndrome without gangrene: Secondary | ICD-10-CM | POA: Diagnosis present

## 2020-06-29 DIAGNOSIS — K8000 Calculus of gallbladder with acute cholecystitis without obstruction: Secondary | ICD-10-CM | POA: Diagnosis present

## 2020-06-29 DIAGNOSIS — K219 Gastro-esophageal reflux disease without esophagitis: Secondary | ICD-10-CM | POA: Diagnosis present

## 2020-06-29 DIAGNOSIS — J449 Chronic obstructive pulmonary disease, unspecified: Secondary | ICD-10-CM | POA: Diagnosis present

## 2020-06-29 DIAGNOSIS — J90 Pleural effusion, not elsewhere classified: Secondary | ICD-10-CM

## 2020-06-29 DIAGNOSIS — Z888 Allergy status to other drugs, medicaments and biological substances status: Secondary | ICD-10-CM

## 2020-06-29 DIAGNOSIS — Z7951 Long term (current) use of inhaled steroids: Secondary | ICD-10-CM

## 2020-06-29 DIAGNOSIS — Z8659 Personal history of other mental and behavioral disorders: Secondary | ICD-10-CM

## 2020-06-29 DIAGNOSIS — Z79899 Other long term (current) drug therapy: Secondary | ICD-10-CM

## 2020-06-29 DIAGNOSIS — N3010 Interstitial cystitis (chronic) without hematuria: Secondary | ICD-10-CM | POA: Diagnosis present

## 2020-06-29 DIAGNOSIS — G4700 Insomnia, unspecified: Secondary | ICD-10-CM | POA: Diagnosis present

## 2020-06-29 DIAGNOSIS — J302 Other seasonal allergic rhinitis: Secondary | ICD-10-CM | POA: Diagnosis present

## 2020-06-29 DIAGNOSIS — K810 Acute cholecystitis: Secondary | ICD-10-CM | POA: Diagnosis present

## 2020-06-29 DIAGNOSIS — N941 Unspecified dyspareunia: Secondary | ICD-10-CM | POA: Diagnosis present

## 2020-06-29 DIAGNOSIS — F17200 Nicotine dependence, unspecified, uncomplicated: Secondary | ICD-10-CM | POA: Diagnosis present

## 2020-06-29 DIAGNOSIS — Z79890 Hormone replacement therapy (postmenopausal): Secondary | ICD-10-CM

## 2020-06-29 DIAGNOSIS — K7460 Unspecified cirrhosis of liver: Secondary | ICD-10-CM | POA: Diagnosis present

## 2020-06-29 DIAGNOSIS — R140 Abdominal distension (gaseous): Secondary | ICD-10-CM | POA: Diagnosis present

## 2020-06-29 DIAGNOSIS — F32A Depression, unspecified: Secondary | ICD-10-CM | POA: Diagnosis present

## 2020-06-29 DIAGNOSIS — F419 Anxiety disorder, unspecified: Secondary | ICD-10-CM | POA: Diagnosis present

## 2020-06-29 DIAGNOSIS — N184 Chronic kidney disease, stage 4 (severe): Secondary | ICD-10-CM | POA: Diagnosis present

## 2020-06-29 DIAGNOSIS — D72829 Elevated white blood cell count, unspecified: Secondary | ICD-10-CM

## 2020-06-29 DIAGNOSIS — K660 Peritoneal adhesions (postprocedural) (postinfection): Secondary | ICD-10-CM | POA: Diagnosis present

## 2020-06-29 DIAGNOSIS — R1011 Right upper quadrant pain: Secondary | ICD-10-CM | POA: Diagnosis not present

## 2020-06-29 DIAGNOSIS — Z8249 Family history of ischemic heart disease and other diseases of the circulatory system: Secondary | ICD-10-CM

## 2020-06-29 DIAGNOSIS — M79601 Pain in right arm: Secondary | ICD-10-CM

## 2020-06-29 DIAGNOSIS — K838 Other specified diseases of biliary tract: Secondary | ICD-10-CM | POA: Diagnosis present

## 2020-06-29 DIAGNOSIS — N1831 Chronic kidney disease, stage 3a: Secondary | ICD-10-CM | POA: Diagnosis present

## 2020-06-29 DIAGNOSIS — E876 Hypokalemia: Secondary | ICD-10-CM | POA: Diagnosis not present

## 2020-06-29 DIAGNOSIS — G629 Polyneuropathy, unspecified: Secondary | ICD-10-CM | POA: Diagnosis present

## 2020-06-29 DIAGNOSIS — F552 Abuse of laxatives: Secondary | ICD-10-CM | POA: Diagnosis present

## 2020-06-29 DIAGNOSIS — Z881 Allergy status to other antibiotic agents status: Secondary | ICD-10-CM

## 2020-06-29 DIAGNOSIS — K5909 Other constipation: Secondary | ICD-10-CM | POA: Diagnosis present

## 2020-06-29 DIAGNOSIS — D631 Anemia in chronic kidney disease: Secondary | ICD-10-CM | POA: Diagnosis present

## 2020-06-29 DIAGNOSIS — Z7289 Other problems related to lifestyle: Secondary | ICD-10-CM

## 2020-06-29 DIAGNOSIS — N179 Acute kidney failure, unspecified: Secondary | ICD-10-CM | POA: Diagnosis not present

## 2020-06-29 LAB — RESP PANEL BY RT-PCR (FLU A&B, COVID) ARPGX2
Influenza A by PCR: NEGATIVE
Influenza B by PCR: NEGATIVE
SARS Coronavirus 2 by RT PCR: NEGATIVE

## 2020-06-29 LAB — URINALYSIS, COMPLETE (UACMP) WITH MICROSCOPIC
Bacteria, UA: NONE SEEN
Bilirubin Urine: NEGATIVE
Glucose, UA: NEGATIVE mg/dL
Ketones, ur: NEGATIVE mg/dL
Nitrite: POSITIVE — AB
Protein, ur: 30 mg/dL — AB
Specific Gravity, Urine: 1.016 (ref 1.005–1.030)
Squamous Epithelial / HPF: NONE SEEN (ref 0–5)
pH: 5 (ref 5.0–8.0)

## 2020-06-29 LAB — CBC
HCT: 38.9 % (ref 36.0–46.0)
Hemoglobin: 11.9 g/dL — ABNORMAL LOW (ref 12.0–15.0)
MCH: 32.4 pg (ref 26.0–34.0)
MCHC: 30.6 g/dL (ref 30.0–36.0)
MCV: 106 fL — ABNORMAL HIGH (ref 80.0–100.0)
Platelets: 555 10*3/uL — ABNORMAL HIGH (ref 150–400)
RBC: 3.67 MIL/uL — ABNORMAL LOW (ref 3.87–5.11)
RDW: 15.9 % — ABNORMAL HIGH (ref 11.5–15.5)
WBC: 30.4 10*3/uL — ABNORMAL HIGH (ref 4.0–10.5)
nRBC: 0 % (ref 0.0–0.2)

## 2020-06-29 LAB — COMPREHENSIVE METABOLIC PANEL
ALT: 9 U/L (ref 0–44)
AST: 37 U/L (ref 15–41)
Albumin: 3.5 g/dL (ref 3.5–5.0)
Alkaline Phosphatase: 82 U/L (ref 38–126)
Anion gap: 11 (ref 5–15)
BUN: 38 mg/dL — ABNORMAL HIGH (ref 6–20)
CO2: 12 mmol/L — ABNORMAL LOW (ref 22–32)
Calcium: 9.1 mg/dL (ref 8.9–10.3)
Chloride: 111 mmol/L (ref 98–111)
Creatinine, Ser: 1.69 mg/dL — ABNORMAL HIGH (ref 0.44–1.00)
GFR, Estimated: 37 mL/min — ABNORMAL LOW (ref >60)
Glucose, Bld: 98 mg/dL (ref 70–99)
Potassium: 4.7 mmol/L (ref 3.5–5.1)
Sodium: 134 mmol/L — ABNORMAL LOW (ref 135–145)
Total Bilirubin: 0.6 mg/dL (ref 0.3–1.2)
Total Protein: 7.1 g/dL (ref 6.5–8.1)

## 2020-06-29 LAB — SURGICAL PATHOLOGY

## 2020-06-29 LAB — LIPASE, BLOOD: Lipase: 38 U/L (ref 11–51)

## 2020-06-29 LAB — POC URINE PREG, ED: Preg Test, Ur: NEGATIVE

## 2020-06-29 MED ORDER — ALBUTEROL SULFATE (2.5 MG/3ML) 0.083% IN NEBU: 2.5000 mg | INHALATION_SOLUTION | RESPIRATORY_TRACT | Status: DC | PRN

## 2020-06-29 MED ORDER — ACETAMINOPHEN 325 MG PO TABS
325.0000 mg | ORAL_TABLET | Freq: Four times a day (QID) | ORAL | Status: AC | PRN
  Administered 2020-06-30: 325 mg via ORAL
  Filled 2020-06-29: qty 1

## 2020-06-29 MED ORDER — DIPHENHYDRAMINE HCL 25 MG PO CAPS: 25.0000 mg | ORAL_CAPSULE | Freq: Every evening | ORAL | Status: DC | PRN

## 2020-06-29 MED ORDER — ONDANSETRON HCL 4 MG PO TABS
4.0000 mg | ORAL_TABLET | Freq: Four times a day (QID) | ORAL | Status: AC | PRN
  Administered 2020-07-04: 4 mg via ORAL
  Filled 2020-06-29: qty 1

## 2020-06-29 MED ORDER — FAMOTIDINE 20 MG PO TABS
40.0000 mg | ORAL_TABLET | Freq: Every day | ORAL | Status: DC
  Administered 2020-06-29 – 2020-07-05 (×7): 40 mg via ORAL
  Filled 2020-06-29 (×7): qty 2

## 2020-06-29 MED ORDER — MORPHINE SULFATE (PF) 4 MG/ML IV SOLN
4.0000 mg | Freq: Once | INTRAVENOUS | Status: AC
  Administered 2020-06-29: 4 mg via INTRAVENOUS
  Filled 2020-06-29: qty 1

## 2020-06-29 MED ORDER — PIPERACILLIN-TAZOBACTAM 3.375 G IVPB
3.3750 g | Freq: Three times a day (TID) | INTRAVENOUS | Status: DC
  Administered 2020-06-29 – 2020-07-01 (×7): 3.375 g via INTRAVENOUS
  Filled 2020-06-29 (×7): qty 50

## 2020-06-29 MED ORDER — ONDANSETRON HCL 4 MG/2ML IJ SOLN
INTRAMUSCULAR | Status: AC
  Administered 2020-06-29: 4 mg via INTRAVENOUS
  Filled 2020-06-29: qty 2

## 2020-06-29 MED ORDER — GABAPENTIN 300 MG PO CAPS
300.0000 mg | ORAL_CAPSULE | Freq: Two times a day (BID) | ORAL | Status: DC
  Administered 2020-06-29 – 2020-07-06 (×12): 300 mg via ORAL
  Filled 2020-06-29 (×13): qty 1

## 2020-06-29 MED ORDER — IOHEXOL 300 MG/ML  SOLN
75.0000 mL | Freq: Once | INTRAMUSCULAR | Status: AC | PRN
  Administered 2020-06-29: 75 mL via INTRAVENOUS

## 2020-06-29 MED ORDER — TAMSULOSIN HCL 0.4 MG PO CAPS
0.4000 mg | ORAL_CAPSULE | Freq: Every day | ORAL | Status: DC
  Administered 2020-06-29 – 2020-07-05 (×7): 0.4 mg via ORAL
  Filled 2020-06-29 (×7): qty 1

## 2020-06-29 MED ORDER — PANTOPRAZOLE SODIUM 40 MG PO TBEC
40.0000 mg | DELAYED_RELEASE_TABLET | Freq: Every day | ORAL | Status: DC
  Administered 2020-07-01 – 2020-07-06 (×6): 40 mg via ORAL
  Filled 2020-06-29 (×6): qty 1

## 2020-06-29 MED ORDER — FLUTICASONE PROPIONATE 50 MCG/ACT NA SUSP
2.0000 | Freq: Two times a day (BID) | NASAL | Status: DC
  Administered 2020-06-30 – 2020-07-06 (×13): 2 via NASAL
  Filled 2020-06-29: qty 16

## 2020-06-29 MED ORDER — IOHEXOL 9 MG/ML PO SOLN
1000.0000 mL | Freq: Once | ORAL | Status: DC | PRN
  Administered 2020-06-29: 1000 mL via ORAL

## 2020-06-29 MED ORDER — MOMETASONE FURO-FORMOTEROL FUM 100-5 MCG/ACT IN AERO
2.0000 | INHALATION_SPRAY | Freq: Two times a day (BID) | RESPIRATORY_TRACT | Status: DC
  Administered 2020-06-30 – 2020-07-06 (×13): 2 via RESPIRATORY_TRACT
  Filled 2020-06-29: qty 8.8

## 2020-06-29 MED ORDER — SODIUM CHLORIDE 0.9 % IV BOLUS
1000.0000 mL | Freq: Once | INTRAVENOUS | Status: AC
  Administered 2020-06-29: 1000 mL via INTRAVENOUS

## 2020-06-29 MED ORDER — ONDANSETRON HCL 4 MG/2ML IJ SOLN: 4.0000 mg | Freq: Once | INTRAMUSCULAR | Status: AC

## 2020-06-29 MED ORDER — THEOPHYLLINE ER 400 MG PO TB24
400.0000 mg | ORAL_TABLET | Freq: Two times a day (BID) | ORAL | Status: DC
  Administered 2020-06-29 – 2020-07-06 (×13): 400 mg via ORAL
  Filled 2020-06-29 (×16): qty 1

## 2020-06-29 MED ORDER — MORPHINE SULFATE (PF) 4 MG/ML IV SOLN: 4.0000 mg | Freq: Once | INTRAVENOUS | Status: AC

## 2020-06-29 MED ORDER — TRAMADOL HCL 50 MG PO TABS
50.0000 mg | ORAL_TABLET | Freq: Four times a day (QID) | ORAL | Status: DC | PRN
  Administered 2020-07-01: 50 mg via ORAL

## 2020-06-29 MED ORDER — GABAPENTIN 300 MG PO CAPS
900.0000 mg | ORAL_CAPSULE | Freq: Every day | ORAL | Status: DC
  Administered 2020-06-30 – 2020-07-01 (×2): 900 mg via ORAL
  Filled 2020-06-29 (×2): qty 3

## 2020-06-29 MED ORDER — ONDANSETRON HCL 4 MG/2ML IJ SOLN
4.0000 mg | Freq: Four times a day (QID) | INTRAMUSCULAR | Status: AC | PRN
  Administered 2020-07-03 – 2020-07-04 (×2): 4 mg via INTRAVENOUS
  Filled 2020-06-29 (×2): qty 2

## 2020-06-29 MED ORDER — NICOTINE 21 MG/24HR TD PT24: 21.0000 mg | MEDICATED_PATCH | Freq: Every day | TRANSDERMAL | Status: DC | PRN

## 2020-06-29 MED ORDER — OXYBUTYNIN CHLORIDE ER 10 MG PO TB24
10.0000 mg | ORAL_TABLET | Freq: Every day | ORAL | Status: DC
  Administered 2020-07-02 – 2020-07-03 (×2): 10 mg via ORAL
  Filled 2020-06-29 (×4): qty 1

## 2020-06-29 MED ORDER — DOCUSATE SODIUM 100 MG PO CAPS
100.0000 mg | ORAL_CAPSULE | Freq: Two times a day (BID) | ORAL | Status: DC
  Administered 2020-06-29 – 2020-07-06 (×12): 100 mg via ORAL
  Filled 2020-06-29 (×13): qty 1

## 2020-06-29 MED ORDER — PIPERACILLIN-TAZOBACTAM 3.375 G IVPB 30 MIN
3.3750 g | Freq: Once | INTRAVENOUS | Status: AC
  Administered 2020-06-29: 3.375 g via INTRAVENOUS
  Filled 2020-06-29: qty 50

## 2020-06-29 MED ORDER — MORPHINE SULFATE (PF) 4 MG/ML IV SOLN
INTRAVENOUS | Status: AC
  Administered 2020-06-29: 4 mg via INTRAVENOUS
  Filled 2020-06-29: qty 1

## 2020-06-29 MED ORDER — GABAPENTIN 300 MG PO CAPS: 300.0000 mg | ORAL_CAPSULE | Freq: Three times a day (TID) | ORAL | Status: DC

## 2020-06-29 MED ORDER — MORPHINE SULFATE (PF) 4 MG/ML IV SOLN
4.0000 mg | INTRAVENOUS | Status: AC | PRN
  Administered 2020-06-29 – 2020-06-30 (×6): 4 mg via INTRAVENOUS
  Filled 2020-06-29 (×6): qty 1

## 2020-06-29 MED ORDER — METOCLOPRAMIDE HCL 10 MG PO TABS
10.0000 mg | ORAL_TABLET | Freq: Two times a day (BID) | ORAL | Status: DC
  Administered 2020-06-29 – 2020-07-06 (×12): 10 mg via ORAL
  Filled 2020-06-29 (×12): qty 1

## 2020-06-29 MED ORDER — ALBUTEROL SULFATE HFA 108 (90 BASE) MCG/ACT IN AERS: 2.0000 | INHALATION_SPRAY | RESPIRATORY_TRACT | Status: DC | PRN

## 2020-06-29 MED ORDER — AMITRIPTYLINE HCL 25 MG PO TABS
25.0000 mg | ORAL_TABLET | Freq: Every day | ORAL | Status: DC
  Administered 2020-06-29 – 2020-06-30 (×2): 25 mg via ORAL
  Administered 2020-07-01: 50 mg via ORAL
  Filled 2020-06-29 (×4): qty 2

## 2020-06-29 MED ORDER — ACETAMINOPHEN 650 MG RE SUPP: 325.0000 mg | Freq: Four times a day (QID) | RECTAL | Status: AC | PRN

## 2020-06-29 NOTE — H&P (Signed)
History and Physical   Katherine Perez [id number] DOB: 1970/08/02 DOA: 06/29/2020  PCP: Gladstone Lighter, MD  Outpatient Specialists: General surgery, Dr. Windell Moment Patient coming from: home   I have personally briefly reviewed patient's old medical records in [place].  Chief Concern: abdominal pain  HPI: Katherine Perez is a 50 y.o. female with medical history significant for recurrent UTI, dyspareunia, incomplete bladder emptying, anxiety, insomnia, asthma, seasonal allergies, neuropathy, presented to the emergency department for chief concerns of right upper quadrant abdominal pain.  She reports the right upper quadrant abdominal pain started AM of admission 06/29/20. She states the pain is 10/10, sharp, and persistent.  She reports the IV morphine improves her pain minimally.  She denies radiation of pain. She denies fever, vomiting, headaches, vision changes, chest pain, shortness of breath, dysuria, hematuria.   Social history: lives with spouse and dogs. She smokes 1 ppd and started at age 79. She infrequently drinks etoh, last drink was April 15, 2020. She states the only recreational drug she uses is marijuana occasionally, last used one week ago.  Vaccination: fully vaccinated and boosted with COVID 19 vaccine  ROS: Constitutional: no weight change, no fever ENT/Mouth: no sore throat, no rhinorrhea Eyes: no eye pain, no vision changes Cardiovascular: no chest pain, no dyspnea,  no edema, no palpitations Respiratory: no cough, no sputum, no wheezing Gastrointestinal: no nausea, no vomiting, no diarrhea, no constipation. RUQ abdominal pain. Genitourinary: no urinary incontinence, no dysuria, no hematuria Musculoskeletal: no arthralgias, no myalgias Skin: no skin lesions, no pruritus, Neuro: + weakness, no loss of consciousness, no syncope Psych: no anxiety, no depression, + decrease appetite Heme/Lymph: no bruising, no bleeding  ED Course: Discussed with ED  provider, patient requiring hospitalization due to sepsis presentation.  Vitals in the ED was afebrile with temperature of 98.9, respiration rate of 18, heart rate 67, blood pressure 181/81, satting at 97% on room air.  WBC was 30.4, hemoglobin 11.9, hematocrit 38.9, platelets 555.  Assessment/Plan  Active Problems:   Sepsis ([place])   Right upper quadrant abdominal pain suspect CBD stone versus biliary leak Sepsis cannot be excluded -Blood cultures x2 and urine cultures ordered -UA was positive for leukocytes and nitrites -HIDA scan ordered, if there is biliary leakage patient will need IR consulted for drain placement -If there is a CBD stone, patient would need MRCP and possible ERCP -General surgery has been consulted, appreciate further recommendations -Zosyn IV continued  GERD -Famotidine 40 mg nightly ordered  Chronic constipation-suspect secondary to chronic pain management, resumed home metoclopramide 10 mg twice daily -Colace 100 mg twice daily  Peripheral neuropathy-gabapentin resumed  Asthma/COPD-inhalers resumed -Theophylline 400 mg p.o. twice daily resumed -Flonase 2 spray each nare twice daily, Dulera 2 puffs inhalation twice daily, albuterol nebulizers every 4 hours as needed for wheezing and shortness of breath  Urinary retention-Flomax 0.5 mg nightly resumed  Covid PCR is negative  Chart reviewed.   DVT prophylaxis: TED hose, a.m. team to initiate pharmacologic DVT prophylaxis pending imaging Code Status: Full code Diet: Heart healthy now, n.p.o. at midnight Family Communication: No Disposition Plan: Pending clinical course, HIDA scan Consults called: General surgery Admission status: Observation MedSurg with telemetry  Past Medical History:  Diagnosis Date  . Anemia   . Asthma   . CKD (chronic kidney disease)   . ETOH abuse   . GERD (gastroesophageal reflux disease)   . IC (interstitial cystitis)   . Laxative abuse   . Neuropathy   . Pneumonia    .  Raynaud disease    Past Surgical History:  Procedure Laterality Date  . COLONOSCOPY    . ESOPHAGOGASTRODUODENOSCOPY    . FRACTURE SURGERY     femur fracture left - 2019  . LEG SURGERY     Social History:  reports that she has been smoking. She has never used smokeless tobacco. She reports current alcohol use. She reports current drug use. Drug: Marijuana.  Allergies  Allergen Reactions  . Cetirizine Hives  . Diazepam Other (See Comments)    Depression   . Baclofen Anxiety and Other (See Comments)    AMS - "Really messed me up, caused me to drop things"    Family History  Problem Relation Age of Onset  . Other Neg Hx    Family history: Family history reviewed and not pertinent  Prior to Admission medications   Medication Sig Start Date End Date Taking? Authorizing Provider  acidophilus (RISAQUAD) CAPS capsule Take by mouth daily. Patient not taking: Reported on 06/27/2020    [provider]  albuterol (VENTOLIN HFA) 108 (90 Base) MCG/ACT inhaler Inhale 2 puffs into the lungs every 4 (four) hours as needed for shortness of breath or wheezing. 06/07/19   [provider]  amitriptyline (ELAVIL) 25 MG tablet Take 25-50 mg by mouth at bedtime. 06/13/20   [provider]  Brimonidine Tartrate 0.025 % SOLN Place 1 drop into both eyes daily.    [provider]  Cholecalciferol (VITAMIN D) 125 MCG (5000 UT) CAPS Take 5,000 Units by mouth daily.    [provider]  Diclofenac-miSOPROStol 75-0.2 MG TBEC Take 1 tablet by mouth 2 (two) times daily. Patient not taking: No sig reported 04/05/20   [provider]  docusate sodium (COLACE) 100 MG capsule Take 100 mg by mouth 2 (two) times daily.    [provider]  famotidine (PEPCID) 40 MG tablet Take 40 mg by mouth at bedtime. 10/07/19   [provider]  fexofenadine (ALLEGRA) 180 MG tablet Take 180 mg by mouth daily as needed for allergies.    [provider]   FIBER PO Take 1 capsule by mouth daily.    [provider]  fluticasone (FLONASE) 50 MCG/ACT nasal spray Place 2 sprays into both nostrils 2 (two) times daily.  09/14/19   [provider]  Fluticasone-Salmeterol (ADVAIR) 100-50 MCG/DOSE AEPB Inhale 1 puff into the lungs 2 (two) times daily. Patient not taking: No sig reported 09/14/19   [provider]  Fluticasone-Salmeterol (ADVAIR) 250-50 MCG/DOSE AEPB Inhale 1 puff into the lungs 2 (two) times daily. 09/14/19   [provider]  gabapentin (NEURONTIN) 300 MG capsule Take 1 capsule (300 mg total) by mouth 3 (three) times daily. Patient taking differently: Take 300 mg by mouth See admin instructions. Take 300 mg by mouth in the morning, 900 mg in the afternoon and 300 mg at bedtime 04/26/20   Fritzi Mandes, MD  HYDROcodone-acetaminophen (NORCO) 5-325 MG tablet Take 1 tablet by mouth every 4 (four) hours as needed for up to 3 days for moderate pain. 06/27/20 06/30/20  Herbert Pun, MD  hydrOXYzine (ATARAX/VISTARIL) 25 MG tablet Take 25-50 mg by mouth at bedtime.    [provider]  Ibuprofen-Acetaminophen (ADVIL DUAL ACTION) 125-250 MG TABS Take 2 tablets by mouth daily as needed (pain/headache).    [provider]  magnesium oxide (MAG-OX) 400 (241.3 Mg) MG tablet Take 1 tablet (400 mg total) by mouth daily. Patient not taking: No sig reported 04/27/20  Fritzi Mandes, MD  medroxyPROGESTERone (DEPO-PROVERA) 150 MG/ML injection Inject 150 mg into the muscle every 3 (three) months.    [provider]  metoCLOPramide (REGLAN) 10 MG tablet Take 10 mg by mouth in the morning and at bedtime. 10/17/19   [provider]  ondansetron (ZOFRAN) 8 MG tablet Take 8 mg by mouth every 8 (eight) hours as needed for vomiting or nausea. 10/23/19   [provider]  oxybutynin (DITROPAN-XL) 10 MG 24 hr tablet Take 10 mg by mouth daily at 12 noon. 04/17/20   [provider]  pentosan  polysulfate (ELMIRON) 100 MG capsule Take 100 mg by mouth at bedtime.    [provider]  potassium chloride SA (KLOR-CON) 20 MEQ tablet Take 2 tablets (40 mEq total) by mouth daily. Patient not taking: No sig reported 04/27/20   Fritzi Mandes, MD  promethazine (PHENERGAN) 25 MG tablet Take 25 mg by mouth every 8 (eight) hours as needed for nausea or vomiting.  10/23/19   [provider]  RABEprazole (ACIPHEX) 20 MG tablet Take 20 mg by mouth in the morning and at bedtime.  10/12/19   [provider]  sodium citrate-citric acid (ORACIT) 500-334 MG/5ML solution Take 15 mLs by mouth in the morning and at bedtime. 05/30/20   [provider]  tamsulosin (FLOMAX) 0.4 MG CAPS capsule Take 0.4 mg by mouth at bedtime. 10/10/19   [provider]  theophylline (UNIPHYL) 400 MG 24 hr tablet Take 400 mg by mouth 2 (two) times daily.  10/10/19   [provider]  thiamine 100 MG tablet Take 1 tablet (100 mg total) by mouth daily. Patient not taking: No sig reported 11/03/19   Bonnielee Haff, MD  traMADol (ULTRAM) 50 MG tablet Take 50 mg by mouth every 6 (six) hours as needed for moderate pain.    [provider]  trimethoprim (TRIMPEX) 100 MG tablet Take 100 mg by mouth daily. 03/28/20   [provider]  TRULANCE 3 MG TABS Take 3 mg by mouth daily at 6 (six) AM.  10/12/19   [provider]   Physical Exam: Vitals:   06/29/20 1550 06/29/20 1554 06/29/20 1730 06/29/20 1735  BP:  139/73 (!) 173/73 (!) 173/73  Pulse:  67 67 67  Resp:  18  18  Temp:    98.9 F (37.2 C)  TempSrc:    Oral  SpO2:  99% 100% 100%  Weight: 59 kg     Height: '5\' 9"'$  (1.753 m)      Constitutional: appears older than chronological age, NAD, calm, comfortable Eyes: PERRL, lids and conjunctivae normal ENMT: Mucous membranes are moist. Posterior pharynx clear of any exudate or lesions. Age-appropriate dentition. Hearing appropriate Neck: normal, supple, no masses, no  thyromegaly Respiratory: clear to auscultation bilaterally, no wheezing, no crackles. Normal respiratory effort. No accessory muscle use.  Cardiovascular: Regular rate and rhythm, no murmurs / rubs / gallops. No extremity edema. 2+ pedal pulses. No carotid bruits.  Abdomen: no tenderness, no masses palpated, no hepatosplenomegaly. Bowel sounds positive.  Musculoskeletal: no clubbing / cyanosis. No joint deformity upper and lower extremities. Good ROM, no contractures, no atrophy. Normal muscle tone.  Skin: no rashes, lesions, ulcers. No induration Neurologic: Sensation intact. Strength 5/5 in all 4.  Psychiatric: Normal judgment and insight. Alert and oriented x 3. Normal mood.   EKG: independently reviewed, showing sinus rhythm with rate of 69, QTc 418  Chest x-ray on Admission: I personally reviewed and I agree  with radiologist reading as below.  DG Chest 1 View  Result Date: 06/29/2020 CLINICAL DATA:  Pleural effusion EXAM: CHEST  1 VIEW COMPARISON:  April 23, 2020 FINDINGS: The lung volumes are low. The heart size is unremarkable. There is vascular congestion with small bilateral pleural effusions. There are prominent interstitial lung markings. There is a linear opacity in the left lower lung zone favored to represent atelectasis. There is no acute osseous abnormality. The trachea is midline. IMPRESSION: Prominent interstitial lung markings with low lung volumes. Findings may be secondary to interstitial edema or an atypical infectious process. There are small bilateral pleural effusions. Electronically Signed   By: Constance Holster M.D.   On: 06/29/2020 18:45   CT Abdomen Pelvis W Contrast  Result Date: 06/29/2020 CLINICAL DATA:  Epigastric pain.  Increasing abdominal pain. EXAM: CT ABDOMEN AND PELVIS WITH CONTRAST TECHNIQUE: Multidetector CT imaging of the abdomen and pelvis was performed using the standard protocol following bolus administration of intravenous contrast. CONTRAST:  10m  OMNIPAQUE IOHEXOL 300 MG/ML  SOLN COMPARISON:  CT 04/22/2020, abdominal ultrasound 04/23/2020 FINDINGS: Lower chest: Hypoventilatory changes in the lung bases. There is a small right pleural effusion. Hepatobiliary: 4 mm hypodensity in the periphery of the right hepatic lobe is too small to characterize. Coarse calcification in the subcapsular right lobe is unchanged. The gallbladder is decompressed. No calcified gallstones. There is extrahepatic biliary ductal dilatation with common bile duct measuring 11 mm. There is a questionable intraluminal filling defect near the ampulla, series 2, image 41. Central intrahepatic biliary ductal dilatation. Pancreas: No ductal dilatation or inflammation. Spleen: Normal in size without focal abnormality. Adrenals/Urinary Tract: Punctate right adrenal calcifications. No nodule of either adrenal gland. Bilateral renal parenchymal atrophy in thinning of the renal parenchyma. Cortical scarring in the upper left kidney. Previous medullary calcinosis is partially obscured by IV contrast on the current exam. Calcification in the lower left kidney may be a nonobstructing stone or parenchymal. No hydronephrosis. There is absent renal excretion on delayed phase imaging. Stomach/Bowel: Bowel evaluation is limited in the absence of enteric contrast and paucity of intra-abdominal fat. Fluid distends the stomach. Question of gastric hyperemia and rugal fold thickening. Fluid within the proximal duodenum. Distal small bowel loops are fluid-filled and mildly prominent. More proximal small bowel are nondistended. No convincing small bowel inflammation. The appendix is visualized and normal. There is a large volume of stool in the ascending and transverse colon. Moderate stool in the descending colon. Proximal colon is diffusely tortuous. No colonic wall thickening. Sigmoid colon effaces the obturator canals without frank hernia. Vascular/Lymphatic: Age advanced aortic and branch atherosclerosis.  No aneurysm. Atherosclerosis of the proximal common iliac arteries with mild luminal narrowing. Patent portal vein. No portal venous or mesenteric gas. No bulky abdominopelvic adenopathy. Reproductive: Atrophic uterus.  No adnexal mass. Other: Small volume abdominopelvic ascites. Mild edema of the intra-abdominal mesenteric fat. There is subcutaneous edema, which is confluent in the flanks. Few foci of soft tissue gas in the anterior abdominal wall, typical of medication injection sites. No loculated subcutaneous collection. Musculoskeletal: Degenerative change at L4-L5. There are no acute or suspicious osseous abnormalities. IMPRESSION: 1. Intrahepatic and extrahepatic biliary ductal dilatation with questionable intraluminal filling defect near the ampulla. Recommend further correlation with LFTs. If elevated recommend evaluation with ERCP or MRCP. Gallbladder is decompressed. 2. Small volume abdominopelvic ascites. Small right pleural effusion. Body wall edema. Findings suggest with third-spacing. 3. Large volume of stool in the ascending and transverse colon with colonic tortuosity,  can be seen with constipation. 4. Absent renal excretion on delayed phase imaging suggest underlying renal dysfunction. Mild bilateral renal atrophy. Nephrocalcinosis on prior noncontrast exam is not as well appreciated. 5. Age advanced aortic atherosclerosis. Aortic Atherosclerosis (ICD10-I70.0). Electronically Signed   By: Keith Rake M.D.   On: 06/29/2020 17:14   Labs on Admission: I have personally reviewed following labs  CBC: Recent Labs  Lab 06/27/20 1042 06/29/20 1602  WBC  --  30.4*  HGB 10.9* 11.9*  HCT 32.0* 38.9  MCV  --  106.0*  PLT  --  [id number]*   Basic Metabolic Panel: Recent Labs  Lab 06/23/20 1000 06/27/20 1042 06/27/20 1047 06/27/20 1335 06/29/20 1602  NA 132* 139  --  139 134*  K 2.3* 5.7* 5.6* 4.4 4.7  CL 106 119*  --  120* 111  CO2 12*  --   --  13* 12*  GLUCOSE 82 79  --  73 98  BUN  47* 34*  --  37* 38*  CREATININE 1.94* 2.00*  --  1.86* 1.69*  CALCIUM 10.3  --   --  8.8* 9.1   GFR: Estimated Creatinine Clearance: 37.9 mL/min (A) (by C-G formula based on SCr of 1.69 mg/dL (H)).  Liver Function Tests: Recent Labs  Lab 06/23/20 1000 06/29/20 1602  AST 19 37  ALT 17 9  ALKPHOS 100 82  BILITOT 0.6 0.6  PROT 7.2 7.1  ALBUMIN 3.5 3.5   Recent Labs  Lab 06/29/20 1602  LIPASE 38   Coagulation Profile: Recent Labs  Lab 06/23/20 1000  INR 1.0   Recent Labs  Lab 06/27/20 1349 06/27/20 1620  GLUCAP 74 123*   Urine analysis:    Component Value Date/Time   COLORURINE BIOCHEMICALS MAY BE AFFECTED BY COLOR (A) 10/26/2019 1231   APPEARANCEUR CLOUDY (A) 10/26/2019 1231   LABSPEC 1.008 10/26/2019 1231   PHURINE 5.0 10/26/2019 1231   GLUCOSEU NEGATIVE 10/26/2019 1231   HGBUR MODERATE (A) 10/26/2019 1231   BILIRUBINUR NEGATIVE 10/26/2019 1231   KETONESUR 5 (A) 10/26/2019 1231   PROTEINUR 30 (A) 10/26/2019 1231   NITRITE POSITIVE (A) 10/26/2019 1231   LEUKOCYTESUR LARGE (A) 10/26/2019 1231   Katherine Perez N Spero Gunnels D.O. [facility]  If 7PM-7AM, please contact overnight-coverage provider If 7AM-7PM, please contact day coverage provider [email or web address]  06/29/2020, 6:54 PM

## 2020-06-29 NOTE — ED Notes (Signed)
Pt awake and alert - oriented x4.  Reports ongoing RLQ pain described as sharp and stabbing at times and dull/achy at other times; currently rated 8/10 pain being managed with prn IVP Morphine.  RR even and unlabored on RA.  S1 and S2 regular upon ausculation.  Abdomen bloated but soft and tender to palpation.  BSx4 hypoactive; pt states no BM since Sunday (3 days prior to arrival).  Facial edema and redness noted with ecchymosis to BUE.  Spouse at bedside and both spouse and pt are agreeable with plan for admission.  All questions, concerns, needs addressed at this time.  Will monitor for acute changes and maintain plan of care.

## 2020-06-29 NOTE — ED Provider Notes (Signed)
[facility] Emergency Department Provider Note   ____________________________________________   Event Date/Time   First MD Initiated Contact with Patient 06/29/20 1615     (approximate)  I have reviewed the triage vital signs and the nursing notes.   HISTORY  Chief Complaint Abdominal Pain    HPI Katherine Perez is a 50 y.o. female with a stated past medical history of CKD, alcohol abuse, laxative abuse, s/p cholecystectomy on 06/27/20 and neuropathy who presents for right upper quadrant abdominal pain with associated nausea.  Patient describes 10/10 sharp right upper quadrant abdominal pain that radiates through to her back.  Patient has tried taking hydrocodone at home for this pain and denies any relief.  Patient states that she has known gallbladder disease and was scheduled to follow-up with a surgeon soon but was unable to make the appointment secondary to worsening pain.  Patient currently denies any vision changes, tinnitus, difficulty speaking, facial droop, sore throat, chest pain, shortness of breath, diarrhea, dysuria, or weakness/numbness/paresthesias in any extremity         Past Medical History:  Diagnosis Date  . Anemia   . Asthma   . CKD (chronic kidney disease)   . ETOH abuse   . GERD (gastroesophageal reflux disease)   . IC (interstitial cystitis)   . Laxative abuse   . Neuropathy   . Pneumonia   . Raynaud disease     Patient Active Problem List   Diagnosis Date Noted  . Hyperkalemia 06/27/2020  . CKD (chronic kidney disease) stage 3, GFR 30-59 ml/min (HCC) 06/27/2020  . Acute pancreatitis without infection or necrosis 04/23/2020  . GERD without esophagitis 04/23/2020  . Hypokalemia 04/23/2020  . Prolonged QT interval 04/23/2020  . Hypercalcemia   . Pressure injury of skin 10/27/2019  . Hyponatremia 10/26/2019  . Increased anion gap metabolic acidosis 123456  . Acute renal failure superimposed on stage 4 chronic kidney  disease ([place]) 10/26/2019  . Acute metabolic encephalopathy 123456  . Leukocytosis 10/26/2019    Past Surgical History:  Procedure Laterality Date  . COLONOSCOPY    . ESOPHAGOGASTRODUODENOSCOPY    . FRACTURE SURGERY     femur fracture left - 2019  . LEG SURGERY      Prior to Admission medications   Medication Sig Start Date End Date Taking? Authorizing Provider  acidophilus (RISAQUAD) CAPS capsule Take by mouth daily. Patient not taking: Reported on 06/27/2020    [provider]  albuterol (VENTOLIN HFA) 108 (90 Base) MCG/ACT inhaler Inhale 2 puffs into the lungs every 4 (four) hours as needed for shortness of breath or wheezing. 06/07/19   [provider]  amitriptyline (ELAVIL) 25 MG tablet Take 25-50 mg by mouth at bedtime. 06/13/20   [provider]  Brimonidine Tartrate 0.025 % SOLN Place 1 drop into both eyes daily.    [provider]  Cholecalciferol (VITAMIN D) 125 MCG (5000 UT) CAPS Take 5,000 Units by mouth daily.    [provider]  Diclofenac-miSOPROStol 75-0.2 MG TBEC Take 1 tablet by mouth 2 (two) times daily. Patient not taking: No sig reported 04/05/20   [provider]  docusate sodium (COLACE) 100 MG capsule Take 100 mg by mouth 2 (two) times daily.    [provider]  famotidine (PEPCID) 40 MG tablet Take 40 mg by mouth at bedtime. 10/07/19   [provider]  fexofenadine (ALLEGRA) 180 MG tablet Take 180 mg by mouth daily as needed for allergies.  [provider]  FIBER PO Take 1 capsule by mouth daily.    [provider]  fluticasone (FLONASE) 50 MCG/ACT nasal spray Place 2 sprays into both nostrils 2 (two) times daily.  09/14/19   [provider]  Fluticasone-Salmeterol (ADVAIR) 100-50 MCG/DOSE AEPB Inhale 1 puff into the lungs 2 (two) times daily. Patient not taking: No sig reported 09/14/19   [provider]  Fluticasone-Salmeterol (ADVAIR) 250-50 MCG/DOSE  AEPB Inhale 1 puff into the lungs 2 (two) times daily. 09/14/19   [provider]  gabapentin (NEURONTIN) 300 MG capsule Take 1 capsule (300 mg total) by mouth 3 (three) times daily. Patient taking differently: Take 300 mg by mouth See admin instructions. Take 300 mg by mouth in the morning, 900 mg in the afternoon and 300 mg at bedtime 04/26/20   Fritzi Mandes, MD  HYDROcodone-acetaminophen (NORCO) 5-325 MG tablet Take 1 tablet by mouth every 4 (four) hours as needed for up to 3 days for moderate pain. 06/27/20 06/30/20  Herbert Pun, MD  hydrOXYzine (ATARAX/VISTARIL) 25 MG tablet Take 25-50 mg by mouth at bedtime.    [provider]  Ibuprofen-Acetaminophen (ADVIL DUAL ACTION) 125-250 MG TABS Take 2 tablets by mouth daily as needed (pain/headache).    [provider]  magnesium oxide (MAG-OX) 400 (241.3 Mg) MG tablet Take 1 tablet (400 mg total) by mouth daily. Patient not taking: No sig reported 04/27/20   Fritzi Mandes, MD  medroxyPROGESTERone (DEPO-PROVERA) 150 MG/ML injection Inject 150 mg into the muscle every 3 (three) months.    [provider]  metoCLOPramide (REGLAN) 10 MG tablet Take 10 mg by mouth in the morning and at bedtime. 10/17/19   [provider]  ondansetron (ZOFRAN) 8 MG tablet Take 8 mg by mouth every 8 (eight) hours as needed for vomiting or nausea. 10/23/19   [provider]  oxybutynin (DITROPAN-XL) 10 MG 24 hr tablet Take 10 mg by mouth daily at 12 noon. 04/17/20   [provider]  pentosan polysulfate (ELMIRON) 100 MG capsule Take 100 mg by mouth at bedtime.    [provider]  potassium chloride SA (KLOR-CON) 20 MEQ tablet Take 2 tablets (40 mEq total) by mouth daily. Patient not taking: No sig reported 04/27/20   Fritzi Mandes, MD  promethazine (PHENERGAN) 25 MG tablet Take 25 mg by mouth every 8 (eight) hours as needed for nausea or vomiting.  10/23/19   [provider]  RABEprazole (ACIPHEX) 20 MG  tablet Take 20 mg by mouth in the morning and at bedtime.  10/12/19   [provider]  sodium citrate-citric acid (ORACIT) 500-334 MG/5ML solution Take 15 mLs by mouth in the morning and at bedtime. 05/30/20   [provider]  tamsulosin (FLOMAX) 0.4 MG CAPS capsule Take 0.4 mg by mouth at bedtime. 10/10/19   [provider]  theophylline (UNIPHYL) 400 MG 24 hr tablet Take 400 mg by mouth 2 (two) times daily.  10/10/19   [provider]  thiamine 100 MG tablet Take 1 tablet (100 mg total) by mouth daily. Patient not taking: No sig reported 11/03/19   Bonnielee Haff, MD  traMADol (ULTRAM) 50 MG tablet Take 50 mg by mouth every 6 (six) hours as needed for moderate pain.    [provider]  trimethoprim (TRIMPEX) 100 MG tablet Take 100 mg by mouth daily. 03/28/20   [provider]  TRULANCE 3 MG TABS Take 3 mg by mouth daily at 6 (six) AM.  10/12/19   [provider]    Allergies Cetirizine, Diazepam, and Baclofen  Family History  Problem Relation Age of Onset  . Other Neg Hx     Social History Social History   Tobacco Use  . Smoking status: Current Every Day Smoker  . Smokeless tobacco: Never Used  Substance Use Topics  . Alcohol use: Yes    Comment: last drink 03/2020  . Drug use: Yes    Types: Marijuana    Review of Systems Constitutional: No fever/chills Eyes: No visual changes. ENT: No sore throat. Cardiovascular: Denies chest pain. Respiratory: Denies shortness of breath. Gastrointestinal: Endorses abdominal pain and nausea.  No vomiting.  No diarrhea. Genitourinary: Negative for dysuria. Musculoskeletal: Negative for acute arthralgias Skin: Negative for rash. Neurological: Negative for headaches, weakness/numbness/paresthesias in any extremity Psychiatric: Negative for suicidal ideation/homicidal ideation   ____________________________________________   PHYSICAL EXAM:  VITAL SIGNS: ED Triage Vitals  Enc  Vitals Group     BP 06/29/20 1554 139/73     Pulse Rate 06/29/20 1554 67     Resp 06/29/20 1554 18     Temp --      Temp src --      SpO2 06/29/20 1554 99 %     Weight 06/29/20 1550 130 lb (59 kg)     Height 06/29/20 1550 '5\' 9"'$  (1.753 m)     Head Circumference --      Peak Flow --      Pain Score 06/29/20 1550 10     Pain Loc --      Pain Edu? --      Excl. in [place]? --    Constitutional: Alert and oriented. Well appearing and in no acute distress. Eyes: Conjunctivae are normal. PERRL. Head: Atraumatic. Nose: No congestion/rhinnorhea. Mouth/Throat: Mucous membranes are moist. Neck: No stridor Cardiovascular: Grossly normal heart sounds.  Good peripheral circulation. Respiratory: Normal respiratory effort.  No retractions. Gastrointestinal: Severe right upper quadrant tenderness to palpation with guarding. No distention. Musculoskeletal: No obvious deformities Neurologic:  Normal speech and language. No gross focal neurologic deficits are appreciated. Skin:  Skin is warm and dry. No rash noted. Psychiatric: Mood and affect are normal. Speech and behavior are normal.  ____________________________________________   LABS (all labs ordered are listed, but only abnormal results are displayed)  Labs Reviewed  COMPREHENSIVE METABOLIC PANEL - Abnormal; Notable for the following components:      Result Value   Sodium 134 (*)    CO2 12 (*)    BUN 38 (*)    Creatinine, Ser 1.69 (*)    GFR, Estimated 37 (*)    All other components within normal limits  CBC - Abnormal; Notable for the following components:   WBC 30.4 (*)    RBC 3.67 (*)    Hemoglobin 11.9 (*)    MCV 106.0 (*)    RDW 15.9 (*)    Platelets 555 (*)    All other components within normal limits  RESP PANEL BY RT-PCR (FLU A&B, COVID) ARPGX2  LIPASE, BLOOD  URINALYSIS, COMPLETE (UACMP) WITH MICROSCOPIC  POC URINE PREG, ED   ____________________________________________  EKG  ED ECG REPORT I, Naaman Plummer, the  attending physician, personally viewed and interpreted this ECG.  Date: 06/29/2020 EKG Time: 1557 Rate: 69 Rhythm: normal sinus rhythm QRS Axis: normal Intervals: normal ST/T Wave abnormalities: normal Narrative Interpretation: no evidence of acute ischemia  ____________________________________________  RADIOLOGY  ED MD interpretation: Single view portable x-ray of the chest  shows prominent interstitial markings with low lung volumes possibly secondary to interstitial edema or atypical infectious process.  There are also small bilateral pleural effusions  CT of the abdomen and pelvis with contrast shows intrahepatic and extrahepatic biliary ductal dilation with questionable intraluminal filling near the ampulla.  There is a small amount of abdominopelvic ascites with a small right pleural effusion and body wall edema suggestive of third spacing.  Patient also has a large volume of stool that can be seen with constipation.  Official radiology report(s): DG Chest 1 View  Result Date: 06/29/2020 CLINICAL DATA:  Pleural effusion EXAM: CHEST  1 VIEW COMPARISON:  April 23, 2020 FINDINGS: The lung volumes are low. The heart size is unremarkable. There is vascular congestion with small bilateral pleural effusions. There are prominent interstitial lung markings. There is a linear opacity in the left lower lung zone favored to represent atelectasis. There is no acute osseous abnormality. The trachea is midline. IMPRESSION: Prominent interstitial lung markings with low lung volumes. Findings may be secondary to interstitial edema or an atypical infectious process. There are small bilateral pleural effusions. Electronically Signed   By: Constance Holster M.D.   On: 06/29/2020 18:45   CT Abdomen Pelvis W Contrast  Result Date: 06/29/2020 CLINICAL DATA:  Epigastric pain.  Increasing abdominal pain. EXAM: CT ABDOMEN AND PELVIS WITH CONTRAST TECHNIQUE: Multidetector CT imaging of the abdomen and pelvis was  performed using the standard protocol following bolus administration of intravenous contrast. CONTRAST:  80m OMNIPAQUE IOHEXOL 300 MG/ML  SOLN COMPARISON:  CT 04/22/2020, abdominal ultrasound 04/23/2020 FINDINGS: Lower chest: Hypoventilatory changes in the lung bases. There is a small right pleural effusion. Hepatobiliary: 4 mm hypodensity in the periphery of the right hepatic lobe is too small to characterize. Coarse calcification in the subcapsular right lobe is unchanged. The gallbladder is decompressed. No calcified gallstones. There is extrahepatic biliary ductal dilatation with common bile duct measuring 11 mm. There is a questionable intraluminal filling defect near the ampulla, series 2, image 41. Central intrahepatic biliary ductal dilatation. Pancreas: No ductal dilatation or inflammation. Spleen: Normal in size without focal abnormality. Adrenals/Urinary Tract: Punctate right adrenal calcifications. No nodule of either adrenal gland. Bilateral renal parenchymal atrophy in thinning of the renal parenchyma. Cortical scarring in the upper left kidney. Previous medullary calcinosis is partially obscured by IV contrast on the current exam. Calcification in the lower left kidney may be a nonobstructing stone or parenchymal. No hydronephrosis. There is absent renal excretion on delayed phase imaging. Stomach/Bowel: Bowel evaluation is limited in the absence of enteric contrast and paucity of intra-abdominal fat. Fluid distends the stomach. Question of gastric hyperemia and rugal fold thickening. Fluid within the proximal duodenum. Distal small bowel loops are fluid-filled and mildly prominent. More proximal small bowel are nondistended. No convincing small bowel inflammation. The appendix is visualized and normal. There is a large volume of stool in the ascending and transverse colon. Moderate stool in the descending colon. Proximal colon is diffusely tortuous. No colonic wall thickening. Sigmoid colon effaces  the obturator canals without frank hernia. Vascular/Lymphatic: Age advanced aortic and branch atherosclerosis. No aneurysm. Atherosclerosis of the proximal common iliac arteries with mild luminal narrowing. Patent portal vein. No portal venous or mesenteric gas. No bulky abdominopelvic adenopathy. Reproductive: Atrophic uterus.  No adnexal mass. Other: Small volume abdominopelvic ascites. Mild edema of the intra-abdominal mesenteric fat. There is subcutaneous edema, which is confluent in the flanks. Few foci of soft tissue gas in the anterior abdominal wall,  typical of medication injection sites. No loculated subcutaneous collection. Musculoskeletal: Degenerative change at L4-L5. There are no acute or suspicious osseous abnormalities. IMPRESSION: 1. Intrahepatic and extrahepatic biliary ductal dilatation with questionable intraluminal filling defect near the ampulla. Recommend further correlation with LFTs. If elevated recommend evaluation with ERCP or MRCP. Gallbladder is decompressed. 2. Small volume abdominopelvic ascites. Small right pleural effusion. Body wall edema. Findings suggest with third-spacing. 3. Large volume of stool in the ascending and transverse colon with colonic tortuosity, can be seen with constipation. 4. Absent renal excretion on delayed phase imaging suggest underlying renal dysfunction. Mild bilateral renal atrophy. Nephrocalcinosis on prior noncontrast exam is not as well appreciated. 5. Age advanced aortic atherosclerosis. Aortic Atherosclerosis (ICD10-I70.0). Electronically Signed   By: Keith Rake M.D.   On: 06/29/2020 17:14    ____________________________________________   PROCEDURES  Procedure(s) performed (including Critical Care):  .1-3 Lead EKG Interpretation Performed by: Naaman Plummer, MD Authorized by: Naaman Plummer, MD     Interpretation: normal     ECG rate:  75   ECG rate assessment: normal     Rhythm: sinus rhythm     Ectopy: none     Conduction:  normal       ____________________________________________   INITIAL IMPRESSION / ASSESSMENT AND PLAN / ED COURSE  As part of my medical decision making, I reviewed the following data within the [place] notes reviewed and incorporated, Labs reviewed, EKG interpreted, Old chart reviewed, Radiograph reviewed and Notes from prior ED visits reviewed and incorporated        Patient is a 50 year old female who presents 2 days after a cholecystectomy complaining of right upper quadrant abdominal pain.  Differential diagnosis for this patient includes but is not limited to: ascending cholangitis, bile leak, sepsis, cirrhosis, acute hepatitis  Laboratory evaluation significant for leukocytosis to 30.  Patient's creatinine elevated but at baseline for her.  Patient continues to complain of of 10/10 abdominal pain despite multiple doses of morphine.  On reevaluation after IV analgesia, patient still complains of significant pain however she is asleep until I go and wake her up.  We will hold off on further opiate analgesia secondary to mental status.  I spoke to Dr. Christian Mate in general surgery who agrees that patient will likely need inpatient medicine admission with continued IV antibiotics and a HIDA scan.  Dispo: Admit to medicine      ____________________________________________   FINAL CLINICAL IMPRESSION(S) / ED DIAGNOSES  Final diagnoses:  Right upper quadrant abdominal pain     ED Discharge Orders    None       Note:  This document was prepared using Dragon voice recognition software and may include unintentional dictation errors.   Naaman Plummer, MD 06/29/20 [phone]

## 2020-06-29 NOTE — Consult Note (Signed)
[facility] Day(s): 0.   Post op day(s):  2.  Interval History: Patient seen and examined at request of Dr. Cheri Fowler.  Patient reports doing well for the first day and a half following her surgery, robotic cholecystectomy on Monday.  She reports nausea, right upper quadrant abdominal pain, but actually complains more of right arm pain.  She reports having a smoker's cough and denies a new cough, she denies fevers and chills.  Denies a bowel movement for the last 3 days.  She is requesting more pain medication.    Review of Systems:  Constitutional: denies fever, chills  HEENT: denies new cough or congestion Respiratory: Admits to an episode of shortness of breath  Cardiovascular: denies chest pain or palpitations  Genitourinary: Denies burning with urination, but reports urinary frequency Musculoskeletal: Complains of right arm pain, denies decreased motor or sensation Integumentary: denies any new rashes or skin discolorations Neurological: denies HA or vision/hearing changes   Vital signs in last 24 hours: [min-max] current  Temp:  [98.9 F (37.2 C)] 98.9 F (37.2 C) (03/09 1735) Pulse Rate:  [67] 67 (03/09 1735) Resp:  [18] 18 (03/09 1735) BP: (139-173)/(73) 173/73 (03/09 1735) SpO2:  [99 %-100 %] 100 % (03/09 1735) Weight:  [59 kg] 59 kg (03/09 1550)     Height: '5\' 9"'$  (175.3 cm) Weight: 59 kg BMI (Calculated): 19.19   Intake/Output last 2 shifts:  No intake/output data recorded.   Physical Exam:  Constitutional: alert, cooperative and no distress   Eyes: Anicteric sclera Respiratory: breathing non-labored at rest, clear anteriorly to auscultation Cardiovascular: regular rate and sinus rhythm  Gastrointestinal: soft, subjective right upper quadrant and left lateral costal tenderness, and non-distended Musculoskeletal: Extremities are cool to touch, no edema, multiple tattoos, motor and sensation grossly intact   Labs:  CBC  Latest Ref Rng & Units 06/29/2020 06/27/2020 04/23/2020  WBC 4.0 - 10.5 K/uL 30.4(H) - 14.0(H)  Hemoglobin 12.0 - 15.0 g/dL 11.9(L) 10.9(L) 11.4(L)  Hematocrit 36.0 - 46.0 % 38.9 32.0(L) 31.3(L)  Platelets 150 - 400 K/uL 555(H) - 353   CMP Latest Ref Rng & Units 06/29/2020 06/27/2020 06/27/2020  Glucose 70 - 99 mg/dL 98 73 -  BUN 6 - 20 mg/dL 38(H) 37(H) -  Creatinine 0.44 - 1.00 mg/dL 1.69(H) 1.86(H) -  Sodium 135 - 145 mmol/L 134(L) 139 -  Potassium 3.5 - 5.1 mmol/L 4.7 4.4 5.6(H)  Chloride 98 - 111 mmol/L 111 120(H) -  CO2 22 - 32 mmol/L 12(L) 13(L) -  Calcium 8.9 - 10.3 mg/dL 9.1 8.8(L) -  Total Protein 6.5 - 8.1 g/dL 7.1 - -  Total Bilirubin 0.3 - 1.2 mg/dL 0.6 - -  Alkaline Phos 38 - 126 U/L 82 - -  AST 15 - 41 U/L 37 - -  ALT 0 - 44 U/L 9 - -     Imaging studies:  CLINICAL DATA:  Epigastric pain.  Increasing abdominal pain.  EXAM: CT ABDOMEN AND PELVIS WITH CONTRAST  TECHNIQUE: Multidetector CT imaging of the abdomen and pelvis was performed using the standard protocol following bolus administration of intravenous contrast.  CONTRAST:  7m OMNIPAQUE IOHEXOL 300 MG/ML  SOLN  COMPARISON:  CT 04/22/2020, abdominal ultrasound 04/23/2020  FINDINGS: Lower chest: Hypoventilatory changes in the lung bases. There is a small right pleural effusion.  Hepatobiliary: 4 mm hypodensity in the periphery of the right hepatic lobe is too small to characterize. Coarse calcification in the subcapsular right lobe is unchanged.  The gallbladder is decompressed. No calcified gallstones. There is extrahepatic biliary ductal dilatation with common bile duct measuring 11 mm. There is a questionable intraluminal filling defect near the ampulla, series 2, image 41. Central intrahepatic biliary ductal dilatation.  Pancreas: No ductal dilatation or inflammation.  Spleen: Normal in size without focal abnormality.  Adrenals/Urinary Tract: Punctate right adrenal calcifications. No nodule of  either adrenal gland. Bilateral renal parenchymal atrophy in thinning of the renal parenchyma. Cortical scarring in the upper left kidney. Previous medullary calcinosis is partially obscured by IV contrast on the current exam. Calcification in the lower left kidney may be a nonobstructing stone or parenchymal. No hydronephrosis. There is absent renal excretion on delayed phase imaging.  Stomach/Bowel: Bowel evaluation is limited in the absence of enteric contrast and paucity of intra-abdominal fat. Fluid distends the stomach. Question of gastric hyperemia and rugal fold thickening. Fluid within the proximal duodenum. Distal small bowel loops are fluid-filled and mildly prominent. More proximal small bowel are nondistended. No convincing small bowel inflammation. The appendix is visualized and normal. There is a large volume of stool in the ascending and transverse colon. Moderate stool in the descending colon. Proximal colon is diffusely tortuous. No colonic wall thickening. Sigmoid colon effaces the obturator canals without frank hernia.  Vascular/Lymphatic: Age advanced aortic and branch atherosclerosis. No aneurysm. Atherosclerosis of the proximal common iliac arteries with mild luminal narrowing. Patent portal vein. No portal venous or mesenteric gas. No bulky abdominopelvic adenopathy.  Reproductive: Atrophic uterus.  No adnexal mass.  Other: Small volume abdominopelvic ascites. Mild edema of the intra-abdominal mesenteric fat. There is subcutaneous edema, which is confluent in the flanks. Few foci of soft tissue gas in the anterior abdominal wall, typical of medication injection sites. No loculated subcutaneous collection.  Musculoskeletal: Degenerative change at L4-L5. There are no acute or suspicious osseous abnormalities.  IMPRESSION: 1. Intrahepatic and extrahepatic biliary ductal dilatation with questionable intraluminal filling defect near the ampulla.  Recommend further correlation with LFTs. If elevated recommend evaluation with ERCP or MRCP. Gallbladder is decompressed. 2. Small volume abdominopelvic ascites. Small right pleural effusion. Body wall edema. Findings suggest with third-spacing. 3. Large volume of stool in the ascending and transverse colon with colonic tortuosity, can be seen with constipation. 4. Absent renal excretion on delayed phase imaging suggest underlying renal dysfunction. Mild bilateral renal atrophy. Nephrocalcinosis on prior noncontrast exam is not as well appreciated. 5. Age advanced aortic atherosclerosis.  Aortic Atherosclerosis (ICD10-I70.0).   Electronically Signed   By: Keith Rake M.D.   On: 06/29/2020 17:14    Assessment/Plan: 50 y.o. female with complaints of abdominal pain, right arm pain 2 days   s/p robotic cholecystectomy for chronic calculus cholecystitis, complicated by pertinent comorbidities including . Patient Active Problem List   Diagnosis Date Noted  . Hyperkalemia 06/27/2020  . CKD (chronic kidney disease) stage 3, GFR 30-59 ml/min (HCC) 06/27/2020  . Acute pancreatitis without infection or necrosis 04/23/2020  . GERD without esophagitis 04/23/2020  . Hypokalemia 04/23/2020  . Prolonged QT interval 04/23/2020  . Hypercalcemia   . Pressure injury of skin 10/27/2019  . Hyponatremia 10/26/2019  . Increased anion gap metabolic acidosis 123456  . Acute renal failure superimposed on stage 4 chronic kidney disease ([place]) 10/26/2019  . Acute metabolic encephalopathy 123456  . Leukocytosis 10/26/2019     -Due to recent history of cholecystectomy, must rule out biliary leak, though my index of suspicion is quite low.  -Due to an unexplained leukocytosis with associated abdominal  pain and recent surgery would agree with empiric broad-spectrum antibiotic coverage.  -Anticipating serial exams, and follow-up lab work.  -Should HIDA scan be negative, MRCP may provide  more definitive evaluation.  -Consider gastroenterology consultation, as upper endoscopy may also be helpful  All of the above findings and recommendations were discussed with the patient, patient's family, and the medical team, and all of patient's and family's questions were answered to their expressed satisfaction.   -- Ronny Bacon M.D., [facility] 06/29/2020 6:22 PM

## 2020-06-29 NOTE — ED Triage Notes (Signed)
Pt comes EMS from home with increased abd pain. Pt scheduled for gallbladder surgery next week. Pt is just getting worse.

## 2020-06-29 NOTE — ED Notes (Addendum)
Pt had one hydrocodone medication at 3pm

## 2020-06-29 NOTE — ED Notes (Signed)
Patient reports continued pain. Bradler MD notified. MD to follow up.

## 2020-06-29 NOTE — ED Notes (Signed)
Report given to Nicole RN

## 2020-06-30 ENCOUNTER — Observation Stay: Payer: BC Managed Care – PPO

## 2020-06-30 ENCOUNTER — Inpatient Hospital Stay: Payer: BC Managed Care – PPO

## 2020-06-30 DIAGNOSIS — K819 Cholecystitis, unspecified: Secondary | ICD-10-CM | POA: Diagnosis not present

## 2020-06-30 DIAGNOSIS — R109 Unspecified abdominal pain: Secondary | ICD-10-CM | POA: Diagnosis present

## 2020-06-30 DIAGNOSIS — Z20822 Contact with and (suspected) exposure to covid-19: Secondary | ICD-10-CM | POA: Diagnosis present

## 2020-06-30 DIAGNOSIS — N183 Chronic kidney disease, stage 3 unspecified: Secondary | ICD-10-CM | POA: Diagnosis not present

## 2020-06-30 DIAGNOSIS — G4700 Insomnia, unspecified: Secondary | ICD-10-CM | POA: Diagnosis present

## 2020-06-30 DIAGNOSIS — K838 Other specified diseases of biliary tract: Secondary | ICD-10-CM | POA: Diagnosis present

## 2020-06-30 DIAGNOSIS — K810 Acute cholecystitis: Secondary | ICD-10-CM | POA: Diagnosis present

## 2020-06-30 DIAGNOSIS — F419 Anxiety disorder, unspecified: Secondary | ICD-10-CM | POA: Diagnosis present

## 2020-06-30 DIAGNOSIS — K9189 Other postprocedural complications and disorders of digestive system: Principal | ICD-10-CM

## 2020-06-30 DIAGNOSIS — K839 Disease of biliary tract, unspecified: Secondary | ICD-10-CM | POA: Diagnosis not present

## 2020-06-30 DIAGNOSIS — E876 Hypokalemia: Secondary | ICD-10-CM | POA: Diagnosis not present

## 2020-06-30 DIAGNOSIS — Z7951 Long term (current) use of inhaled steroids: Secondary | ICD-10-CM | POA: Diagnosis not present

## 2020-06-30 DIAGNOSIS — N941 Unspecified dyspareunia: Secondary | ICD-10-CM | POA: Diagnosis present

## 2020-06-30 DIAGNOSIS — Z79899 Other long term (current) drug therapy: Secondary | ICD-10-CM | POA: Diagnosis not present

## 2020-06-30 DIAGNOSIS — Z79890 Hormone replacement therapy (postmenopausal): Secondary | ICD-10-CM | POA: Diagnosis not present

## 2020-06-30 DIAGNOSIS — K8000 Calculus of gallbladder with acute cholecystitis without obstruction: Secondary | ICD-10-CM | POA: Diagnosis present

## 2020-06-30 DIAGNOSIS — K5909 Other constipation: Secondary | ICD-10-CM | POA: Diagnosis present

## 2020-06-30 DIAGNOSIS — N179 Acute kidney failure, unspecified: Secondary | ICD-10-CM | POA: Diagnosis not present

## 2020-06-30 DIAGNOSIS — I7300 Raynaud's syndrome without gangrene: Secondary | ICD-10-CM | POA: Diagnosis present

## 2020-06-30 DIAGNOSIS — D72829 Elevated white blood cell count, unspecified: Secondary | ICD-10-CM | POA: Diagnosis not present

## 2020-06-30 DIAGNOSIS — R140 Abdominal distension (gaseous): Secondary | ICD-10-CM | POA: Diagnosis not present

## 2020-06-30 DIAGNOSIS — J449 Chronic obstructive pulmonary disease, unspecified: Secondary | ICD-10-CM | POA: Diagnosis present

## 2020-06-30 DIAGNOSIS — K219 Gastro-esophageal reflux disease without esophagitis: Secondary | ICD-10-CM | POA: Diagnosis present

## 2020-06-30 DIAGNOSIS — Z881 Allergy status to other antibiotic agents status: Secondary | ICD-10-CM | POA: Diagnosis not present

## 2020-06-30 DIAGNOSIS — Z888 Allergy status to other drugs, medicaments and biological substances status: Secondary | ICD-10-CM | POA: Diagnosis not present

## 2020-06-30 DIAGNOSIS — F17200 Nicotine dependence, unspecified, uncomplicated: Secondary | ICD-10-CM | POA: Diagnosis present

## 2020-06-30 DIAGNOSIS — J302 Other seasonal allergic rhinitis: Secondary | ICD-10-CM | POA: Diagnosis present

## 2020-06-30 DIAGNOSIS — Z87440 Personal history of urinary (tract) infections: Secondary | ICD-10-CM | POA: Diagnosis not present

## 2020-06-30 DIAGNOSIS — G629 Polyneuropathy, unspecified: Secondary | ICD-10-CM | POA: Diagnosis present

## 2020-06-30 DIAGNOSIS — R1011 Right upper quadrant pain: Secondary | ICD-10-CM | POA: Diagnosis not present

## 2020-06-30 DIAGNOSIS — N1831 Chronic kidney disease, stage 3a: Secondary | ICD-10-CM | POA: Diagnosis present

## 2020-06-30 DIAGNOSIS — R188 Other ascites: Secondary | ICD-10-CM | POA: Diagnosis present

## 2020-06-30 LAB — BASIC METABOLIC PANEL
Anion gap: 10 (ref 5–15)
BUN: 37 mg/dL — ABNORMAL HIGH (ref 6–20)
CO2: 14 mmol/L — ABNORMAL LOW (ref 22–32)
Calcium: 8.8 mg/dL — ABNORMAL LOW (ref 8.9–10.3)
Chloride: 113 mmol/L — ABNORMAL HIGH (ref 98–111)
Creatinine, Ser: 1.5 mg/dL — ABNORMAL HIGH (ref 0.44–1.00)
GFR, Estimated: 43 mL/min — ABNORMAL LOW (ref >60)
Glucose, Bld: 77 mg/dL (ref 70–99)
Potassium: 5.5 mmol/L — ABNORMAL HIGH (ref 3.5–5.1)
Sodium: 137 mmol/L (ref 135–145)

## 2020-06-30 LAB — CBC
HCT: 41.1 % (ref 36.0–46.0)
Hemoglobin: 12.9 g/dL (ref 12.0–15.0)
MCH: 32.3 pg (ref 26.0–34.0)
MCHC: 31.4 g/dL (ref 30.0–36.0)
MCV: 102.8 fL — ABNORMAL HIGH (ref 80.0–100.0)
Platelets: 589 10*3/uL — ABNORMAL HIGH (ref 150–400)
RBC: 4 MIL/uL (ref 3.87–5.11)
RDW: 15.5 % (ref 11.5–15.5)
WBC: 23.9 10*3/uL — ABNORMAL HIGH (ref 4.0–10.5)
nRBC: 0 % (ref 0.0–0.2)

## 2020-06-30 LAB — HEPATIC FUNCTION PANEL
ALT: 8 U/L (ref 0–44)
AST: 27 U/L (ref 15–41)
Albumin: 2.9 g/dL — ABNORMAL LOW (ref 3.5–5.0)
Alkaline Phosphatase: 87 U/L (ref 38–126)
Bilirubin, Direct: <0.1 mg/dL (ref 0.0–0.2)
Total Bilirubin: 0.7 mg/dL (ref 0.3–1.2)
Total Protein: 6.2 g/dL — ABNORMAL LOW (ref 6.5–8.1)

## 2020-06-30 MED ORDER — NICOTINE 14 MG/24HR TD PT24
14.0000 mg | MEDICATED_PATCH | Freq: Every day | TRANSDERMAL | Status: DC
  Administered 2020-06-30 – 2020-07-06 (×6): 14 mg via TRANSDERMAL
  Filled 2020-06-30 (×8): qty 1

## 2020-06-30 MED ORDER — DOCUSATE SODIUM 283 MG RE ENEM: 1.0000 | ENEMA | Freq: Once | RECTAL | Status: DC

## 2020-06-30 MED ORDER — SORBITOL 70 % SOLN
960.0000 mL | TOPICAL_OIL | Freq: Once | ORAL | Status: AC
  Administered 2020-06-30: 960 mL via RECTAL
  Filled 2020-06-30: qty 240

## 2020-06-30 MED ORDER — SOD CITRATE-CITRIC ACID 500-334 MG/5ML PO SOLN
15.0000 mL | Freq: Two times a day (BID) | ORAL | Status: DC
  Administered 2020-06-30 – 2020-07-06 (×11): 15 mL via ORAL
  Filled 2020-06-30 (×15): qty 15

## 2020-06-30 MED ORDER — MORPHINE SULFATE (PF) 4 MG/ML IV SOLN
4.0000 mg | INTRAVENOUS | Status: DC | PRN
  Administered 2020-06-30 – 2020-07-03 (×13): 4 mg via INTRAVENOUS
  Filled 2020-06-30 (×13): qty 1

## 2020-06-30 MED ORDER — TECHNETIUM TC 99M MEBROFENIN IV KIT
5.0000 | PACK | Freq: Once | INTRAVENOUS | Status: AC | PRN
  Administered 2020-06-30: 4.586 via INTRAVENOUS

## 2020-06-30 MED ORDER — SENNOSIDES-DOCUSATE SODIUM 8.6-50 MG PO TABS
2.0000 | ORAL_TABLET | Freq: Two times a day (BID) | ORAL | Status: DC
  Administered 2020-06-30: 2 via ORAL
  Filled 2020-06-30: qty 2

## 2020-06-30 MED ORDER — SODIUM CHLORIDE 0.9 % IV SOLN: INTRAVENOUS | Status: DC

## 2020-06-30 NOTE — Progress Notes (Signed)
Follow up Note  Patient had HIDA scan that was found positive for bile leak. I contacted GI for ERCP evaluation. I will also order a CT scan of the abdomen due to progressive distention to see if it is new fluid collection not seen on initial CT scan vs reactive ileus vs severe constipation. Will follow closely.   Herbert Pun, MD

## 2020-06-30 NOTE — Progress Notes (Signed)
Progress Note    Katherine Perez  [id number] DOB: 11-02-1970  DOA: 06/29/2020 PCP: Gladstone Lighter, MD    Brief Narrative:     Medical records reviewed and are as summarized below:  Katherine Perez is an 50 y.o. female with medical history significant for recurrent UTI, dyspareunia, incomplete bladder emptying, anxiety, insomnia, asthma, seasonal allergies, neuropathy, presented to the emergency department for chief concerns of right upper quadrant abdominal pain.  Found to have a bile leak on NM scan.    Assessment/Plan:   Active Problems:   Leukocytosis   GERD without esophagitis   CKD (chronic kidney disease) stage 3, GFR 30-59 ml/min (HCC)   Acalculous cholecystitis   Right upper quadrant abdominal pain due to biliary leak -HIDA scan showed: biliary leak -general surgery/GI consult -Zosyn IV continued  GERD -Famotidine 40 mg nightly ordered  Chronic constipation-suspect secondary to chronic pain management, resumed home metoclopramide 10 mg twice daily -enema -bowel regimen  Peripheral neuropathy-gabapentin resumed  Asthma/COPD-inhalers resumed -Theophylline 400 mg p.o. twice daily resumed -Flonase 2 spray each nare twice daily, Dulera 2 puffs inhalation twice daily, albuterol nebulizers every 4 hours as needed for wheezing and shortness of breath  Urinary retention-Flomax 0.5 mg nightly resumed  Hyperkalemia -recheck in AM -may need lokelma   Family Communication/Anticipated D/C date and plan/Code Status   DVT prophylaxis: Lovenox ordered. Code Status: Full Code.  Disposition Plan: Status is: Observation  The patient will require care spanning > 2 midnights and should be moved to inpatient because: Inpatient level of care appropriate due to severity of illness  Dispo: The patient is from: Home              Anticipated d/c is to: Home              Patient currently is not medically stable to d/c.   Difficult to place patient No          Medical Consultants:    GS  GI   Subjective:   Asking for ice this AM prior to procedure  Objective:    Vitals:   06/30/20 0541 06/30/20 0753 06/30/20 0754 06/30/20 1117  BP: (!) 142/91 (!) 143/83  137/84  Pulse: (!) 104 (!) 102 96 90  Resp: '18 16  17  '$ Temp: 98.6 F (37 C) 97.7 F (36.5 C)    TempSrc: Oral Oral    SpO2: 99% 99% 100% 98%  Weight:      Height:        Intake/Output Summary (Last 24 hours) at 06/30/2020 1543 Last data filed at 06/30/2020 1518 Gross per 24 hour  Intake 107.34 ml  Output 400 ml  Net -292.66 ml   Filed Weights   06/29/20 1550  Weight: 59 kg    Exam:  General: Appearance:    Thin female in no acute distress   Mildly Tender to palpation  Lungs:     respirations unlabored  Heart:    Normal heart rate. Normal rhythm. No murmurs, rubs, or gallops.   MS:   All extremities are intact.   Neurologic:   Awake, alert, oriented x 3. No apparent focal neurological           defect.     Data Reviewed:   I have personally reviewed following labs and imaging studies:  Labs: Labs show the following:   Basic Metabolic Panel: Recent Labs  Lab 06/27/20 1042 06/27/20 1047 06/27/20 1335 06/29/20 1602 06/30/20 0447  NA 139  --  139 134* 137  K 5.7*   < > 4.4 4.7 5.5*  CL 119*  --  120* 111 113*  CO2  --   --  13* 12* 14*  GLUCOSE 79  --  73 98 77  BUN 34*  --  37* 38* 37*  CREATININE 2.00*  --  1.86* 1.69* 1.50*  CALCIUM  --   --  8.8* 9.1 8.8*   < > = values in this interval not displayed.   GFR Estimated Creatinine Clearance: 42.7 mL/min (A) (by C-G formula based on SCr of 1.5 mg/dL (H)). Liver Function Tests: Recent Labs  Lab 06/29/20 1602 06/30/20 0447  AST 37 27  ALT 9 8  ALKPHOS 82 87  BILITOT 0.6 0.7  PROT 7.1 6.2*  ALBUMIN 3.5 2.9*   Recent Labs  Lab 06/29/20 1602  LIPASE 38   No results for input(s): AMMONIA in the last 168 hours. Coagulation profile No results for input(s): INR, PROTIME in the last 168  hours.  CBC: Recent Labs  Lab 06/27/20 1042 06/29/20 1602 06/30/20 0447  WBC  --  30.4* 23.9*  HGB 10.9* 11.9* 12.9  HCT 32.0* 38.9 41.1  MCV  --  106.0* 102.8*  PLT  --  555* 589*   Cardiac Enzymes: No results for input(s): CKTOTAL, CKMB, CKMBINDEX, TROPONINI in the last 168 hours. BNP (last 3 results) No results for input(s): PROBNP in the last 8760 hours. CBG: Recent Labs  Lab 06/27/20 1349 06/27/20 1620  GLUCAP 74 123*   D-Dimer: No results for input(s): DDIMER in the last 72 hours. Hgb A1c: No results for input(s): HGBA1C in the last 72 hours. Lipid Profile: No results for input(s): CHOL, HDL, LDLCALC, TRIG, CHOLHDL, LDLDIRECT in the last 72 hours. Thyroid function studies: No results for input(s): TSH, T4TOTAL, T3FREE, THYROIDAB in the last 72 hours.  Invalid input(s): FREET3 Anemia work up: No results for input(s): VITAMINB12, FOLATE, FERRITIN, TIBC, IRON, RETICCTPCT in the last 72 hours. Sepsis Labs: Recent Labs  Lab 06/29/20 1602 06/30/20 0447  WBC 30.4* 23.9*    Microbiology Recent Results (from the past 240 hour(s))  SARS CORONAVIRUS 2 (TAT 6-24 HRS) Nasopharyngeal Nasopharyngeal Swab     Status: None   Collection Time: 06/23/20 10:36 AM   Specimen: Nasopharyngeal Swab  Result Value Ref Range Status   SARS Coronavirus 2 NEGATIVE NEGATIVE Final    Comment: (NOTE) SARS-CoV-2 target nucleic acids are NOT DETECTED.  The SARS-CoV-2 RNA is generally detectable in upper and lower respiratory specimens during the acute phase of infection. Negative results do not preclude SARS-CoV-2 infection, do not rule out co-infections with other pathogens, and should not be used as the sole basis for treatment or other patient management decisions. Negative results must be combined with clinical observations, patient history, and epidemiological information. The expected result is Negative.  Fact Sheet for  Patients: SugarRoll.be  Fact Sheet for Healthcare Providers: https://www.woods-mathews.com/  This test is not yet approved or cleared by the [place] FDA and  has been authorized for detection and/or diagnosis of SARS-CoV-2 by FDA under an Emergency Use Authorization (EUA). This EUA will remain  in effect (meaning this test can be used) for the duration of the COVID-19 declaration under Se ction 564(b)(1) of the Act, 21 U.S.C. section 360bbb-3(b)(1), unless the authorization is terminated or revoked sooner.  Performed at [facility], [address] [place], [place] [address]   Resp Panel by RT-PCR (Flu A&B, Covid) Nasopharyngeal Swab  Status: None   Collection Time: 06/29/20  7:11 PM   Specimen: Nasopharyngeal Swab; Nasopharyngeal(NP) swabs in vial transport medium  Result Value Ref Range Status   SARS Coronavirus 2 by RT PCR NEGATIVE NEGATIVE Final    Comment: (NOTE) SARS-CoV-2 target nucleic acids are NOT DETECTED.  The SARS-CoV-2 RNA is generally detectable in upper respiratory specimens during the acute phase of infection. The lowest concentration of SARS-CoV-2 viral copies this assay can detect is 138 copies/mL. A negative result does not preclude SARS-Cov-2 infection and should not be used as the sole basis for treatment or other patient management decisions. A negative result may occur with  improper specimen collection/handling, submission of specimen other than nasopharyngeal swab, presence of viral mutation(s) within the areas targeted by this assay, and inadequate number of viral copies(<138 copies/mL). A negative result must be combined with clinical observations, patient history, and epidemiological information. The expected result is Negative.  Fact Sheet for Patients:  EntrepreneurPulse.com.au  Fact Sheet for Healthcare Providers:  IncredibleEmployment.be  This test is  no t yet approved or cleared by the [place] FDA and  has been authorized for detection and/or diagnosis of SARS-CoV-2 by FDA under an Emergency Use Authorization (EUA). This EUA will remain  in effect (meaning this test can be used) for the duration of the COVID-19 declaration under Section 564(b)(1) of the Act, 21 U.S.C.section 360bbb-3(b)(1), unless the authorization is terminated  or revoked sooner.       Influenza A by PCR NEGATIVE NEGATIVE Final   Influenza B by PCR NEGATIVE NEGATIVE Final    Comment: (NOTE) The Xpert Xpress SARS-CoV-2/FLU/RSV plus assay is intended as an aid in the diagnosis of influenza from Nasopharyngeal swab specimens and should not be used as a sole basis for treatment. Nasal washings and aspirates are unacceptable for Xpert Xpress SARS-CoV-2/FLU/RSV testing.  Fact Sheet for Patients: EntrepreneurPulse.com.au  Fact Sheet for Healthcare Providers: IncredibleEmployment.be  This test is not yet approved or cleared by the [place] FDA and has been authorized for detection and/or diagnosis of SARS-CoV-2 by FDA under an Emergency Use Authorization (EUA). This EUA will remain in effect (meaning this test can be used) for the duration of the COVID-19 declaration under Section 564(b)(1) of the Act, 21 U.S.C. section 360bbb-3(b)(1), unless the authorization is terminated or revoked.  Performed at [facility], [address] [place], [place] [address]   CULTURE, BLOOD (ROUTINE X 2) w Reflex to ID Panel     Status: None (Preliminary result)   Collection Time: 06/29/20 10:18 PM   Specimen: BLOOD  Result Value Ref Range Status   Specimen Description BLOOD BLOOD RIGHT ARM  Final   Special Requests   Final    BOTTLES DRAWN AEROBIC AND ANAEROBIC Blood Culture results may not be optimal due to an inadequate volume of blood received in culture bottles   Culture   Final    NO GROWTH < 12 HOURS Performed at  [facility], [address], [place], [place] [address]    Report Status PENDING  Incomplete  CULTURE, BLOOD (ROUTINE X 2) w Reflex to ID Panel     Status: None (Preliminary result)   Collection Time: 06/29/20 10:34 PM   Specimen: BLOOD  Result Value Ref Range Status   Specimen Description BLOOD BLOOD RIGHT ARM  Final   Special Requests   Final    BOTTLES DRAWN AEROBIC AND ANAEROBIC Blood Culture results may not be optimal due to an inadequate volume of blood  received in culture bottles   Culture   Final    NO GROWTH < 12 HOURS Performed at [facility], [address] [place], [place] [address]    Report Status PENDING  Incomplete    Procedures and diagnostic studies:  DG Chest 1 View  Result Date: 06/29/2020 CLINICAL DATA:  Pleural effusion EXAM: CHEST  1 VIEW COMPARISON:  April 23, 2020 FINDINGS: The lung volumes are low. The heart size is unremarkable. There is vascular congestion with small bilateral pleural effusions. There are prominent interstitial lung markings. There is a linear opacity in the left lower lung zone favored to represent atelectasis. There is no acute osseous abnormality. The trachea is midline. IMPRESSION: Prominent interstitial lung markings with low lung volumes. Findings may be secondary to interstitial edema or an atypical infectious process. There are small bilateral pleural effusions. Electronically Signed   By: Constance Holster M.D.   On: 06/29/2020 18:45   CT Abdomen Pelvis W Contrast  Result Date: 06/29/2020 CLINICAL DATA:  Epigastric pain.  Increasing abdominal pain. EXAM: CT ABDOMEN AND PELVIS WITH CONTRAST TECHNIQUE: Multidetector CT imaging of the abdomen and pelvis was performed using the standard protocol following bolus administration of intravenous contrast. CONTRAST:  48m OMNIPAQUE IOHEXOL 300 MG/ML  SOLN COMPARISON:  CT 04/22/2020, abdominal ultrasound 04/23/2020 FINDINGS: Lower chest: Hypoventilatory changes in the lung  bases. There is a small right pleural effusion. Hepatobiliary: 4 mm hypodensity in the periphery of the right hepatic lobe is too small to characterize. Coarse calcification in the subcapsular right lobe is unchanged. The gallbladder is decompressed. No calcified gallstones. There is extrahepatic biliary ductal dilatation with common bile duct measuring 11 mm. There is a questionable intraluminal filling defect near the ampulla, series 2, image 41. Central intrahepatic biliary ductal dilatation. Pancreas: No ductal dilatation or inflammation. Spleen: Normal in size without focal abnormality. Adrenals/Urinary Tract: Punctate right adrenal calcifications. No nodule of either adrenal gland. Bilateral renal parenchymal atrophy in thinning of the renal parenchyma. Cortical scarring in the upper left kidney. Previous medullary calcinosis is partially obscured by IV contrast on the current exam. Calcification in the lower left kidney may be a nonobstructing stone or parenchymal. No hydronephrosis. There is absent renal excretion on delayed phase imaging. Stomach/Bowel: Bowel evaluation is limited in the absence of enteric contrast and paucity of intra-abdominal fat. Fluid distends the stomach. Question of gastric hyperemia and rugal fold thickening. Fluid within the proximal duodenum. Distal small bowel loops are fluid-filled and mildly prominent. More proximal small bowel are nondistended. No convincing small bowel inflammation. The appendix is visualized and normal. There is a large volume of stool in the ascending and transverse colon. Moderate stool in the descending colon. Proximal colon is diffusely tortuous. No colonic wall thickening. Sigmoid colon effaces the obturator canals without frank hernia. Vascular/Lymphatic: Age advanced aortic and branch atherosclerosis. No aneurysm. Atherosclerosis of the proximal common iliac arteries with mild luminal narrowing. Patent portal vein. No portal venous or mesenteric gas.  No bulky abdominopelvic adenopathy. Reproductive: Atrophic uterus.  No adnexal mass. Other: Small volume abdominopelvic ascites. Mild edema of the intra-abdominal mesenteric fat. There is subcutaneous edema, which is confluent in the flanks. Few foci of soft tissue gas in the anterior abdominal wall, typical of medication injection sites. No loculated subcutaneous collection. Musculoskeletal: Degenerative change at L4-L5. There are no acute or suspicious osseous abnormalities. IMPRESSION: 1. Intrahepatic and extrahepatic biliary ductal dilatation with questionable intraluminal filling defect near the ampulla. Recommend further correlation with LFTs. If  elevated recommend evaluation with ERCP or MRCP. Gallbladder is decompressed. 2. Small volume abdominopelvic ascites. Small right pleural effusion. Body wall edema. Findings suggest with third-spacing. 3. Large volume of stool in the ascending and transverse colon with colonic tortuosity, can be seen with constipation. 4. Absent renal excretion on delayed phase imaging suggest underlying renal dysfunction. Mild bilateral renal atrophy. Nephrocalcinosis on prior noncontrast exam is not as well appreciated. 5. Age advanced aortic atherosclerosis. Aortic Atherosclerosis (ICD10-I70.0). Electronically Signed   By: Keith Rake M.D.   On: 06/29/2020 17:14   NM HEPATOBILIARY LEAK (POST-SURGICAL)  Result Date: 06/30/2020 CLINICAL DATA:  Recurrent abdominal pain. Status post cholecystectomy several days ago. Evaluate for bile leak. EXAM: NUCLEAR MEDICINE HEPATOBILIARY IMAGING TECHNIQUE: Sequential images of the abdomen were obtained out to 60 minutes following intravenous administration of radiopharmaceutical. RADIOPHARMACEUTICALS:  4.6 mCi Tc-46m Choletec IV COMPARISON:  None. FINDINGS: Prompt uptake and biliary excretion of activity by the liver is seen. No gallbladder activity is seen, consistent with prior cholecystectomy. Excreted biliary activity shows large  leak with accumulation in the right perihepatic and subphrenic spaces and right paracolic gutter, consistent with postop bile leak. IMPRESSION: Prior cholecystectomy, with large post-op bile leak. These results will be called to the ordering clinician or representative by the Radiologist Assistant, and communication documented in the PACS or [employer] Electronically Signed   By: JMarlaine HindM.D.   On: 06/30/2020 15:14    Medications:   . amitriptyline  25-50 mg Oral QHS  . docusate sodium  100 mg Oral BID  . famotidine  40 mg Oral QHS  . fluticasone  2 spray Each Nare BID  . gabapentin  300 mg Oral BID  . gabapentin  900 mg Oral Q1500  . metoCLOPramide  10 mg Oral BID  . mometasone-formoterol  2 puff Inhalation BID  . oxybutynin  10 mg Oral Q1200  . pantoprazole  40 mg Oral Daily  . senna-docusate  2 tablet Oral BID  . sodium citrate-citric acid  15 mL Oral BID  . tamsulosin  0.4 mg Oral QHS  . theophylline  400 mg Oral BID   Continuous Infusions: . piperacillin-tazobactam (ZOSYN)  IV 12.5 mL/hr at 06/30/20 1518     LOS: 0 days   JGeradine Girt [facility]   How to contact the [facility] CenterAttending or Consulting provider [place] covering provider during after hours [place] for this patient?  1. Check the care team in [facility] look for a) attending/consulting [facility] provider listed and b) the [facility]am listed 2. Log into [email or web address] and use [place]'s universal password to access. If you do not have the password, please contact the hospital operator. 3. Locate the [facility]Miss[facility] Medical Centerprovider you are looking for under [facility] and page to a number that you can be directly reached. 4. If you still have difficulty reaching the provider, please page the [facility](Director on Call) for the Hospitalists listed on amion for assistance.  06/30/2020, 3:43 PM

## 2020-06-30 NOTE — Consult Note (Signed)
Cephas Darby, MD [address]  [place]  [place], [place] [address]  Main: [phone]  Fax: [phone] Pager: [phone]   Consultation  Referring Provider:     No ref. provider found Primary Care Physician:  Gladstone Lighter, MD Primary Gastroenterologist:  Dr. Weyman Croon, [facility]         Reason for Consultation:     Postop bile leak  Date of Admission:  06/29/2020 Date of Consultation:  06/30/2020         HPI:   Katherine Perez is a 50 y.o. 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  Patient is admitted with abdominal pain.  Patient underwent robotic assisted laparoscopic cholecystectomy for symptomatic cholelithiasis on 06/27/2020.  She had history of gallstone pancreatitis.  She presented with abdominal pain yesterday.  Initial CT revealed intrahepatic and extrahepatic biliary ductal dilation with questionable intraluminal filling defect near the ampulla.  Large volume of stool in the colon secondary to constipation, small volume ascites.  Patient's abdominal pain was initially attributed to constipation for which she was treated for it.  Her transaminases and alkaline phosphatase, bilirubin have been normal.  However, she developed significant leukocytosis on admission 30K, she was started on antibiotics.  Improved to 23K.  Due to ongoing abdominal pain and worsening of distension, patient underwent HIDA scan which confirmed large postop biliary leak with recent cholecystectomy.  Therefore, GI is consulted for ERCP   NSAIDs: None  Antiplts/Anticoagulants/Anti thrombotics: None   Past Medical History:  Diagnosis Date  . Anemia   . Asthma   . CKD (chronic kidney disease)   . ETOH abuse   . GERD (gastroesophageal reflux disease)   . IC (interstitial cystitis)   . Laxative abuse   . Neuropathy   . Pneumonia   . Raynaud disease     Past Surgical  History:  Procedure Laterality Date  . COLONOSCOPY    . ESOPHAGOGASTRODUODENOSCOPY    . FRACTURE SURGERY     femur fracture left - 2019  . LEG SURGERY      Prior to Admission medications   Medication Sig Start Date End Date Taking? Authorizing Provider  acidophilus (RISAQUAD) CAPS capsule Take by mouth daily. Patient not taking: Reported on 06/27/2020    [provider]  albuterol (VENTOLIN HFA) 108 (90 Base) MCG/ACT inhaler Inhale 2 puffs into the lungs every 4 (four) hours as needed for shortness of breath or wheezing. 06/07/19   [provider]  amitriptyline (ELAVIL) 25 MG tablet Take 25-50 mg by mouth at bedtime. 06/13/20   [provider]  Brimonidine Tartrate 0.025 % SOLN Place 1 drop into both eyes daily.    [provider]  Cholecalciferol (VITAMIN D) 125 MCG (5000 UT) CAPS Take 5,000 Units by mouth daily.    [provider]  Diclofenac-miSOPROStol 75-0.2 MG TBEC Take 1 tablet by mouth 2 (two) times daily. Patient not taking: No sig reported 04/05/20   [provider]  docusate sodium (COLACE) 100 MG capsule Take 100 mg by mouth 2 (two) times daily.    [provider]  famotidine (PEPCID) 40 MG tablet Take 40 mg by mouth at bedtime. 10/07/19   [provider]  fexofenadine (ALLEGRA) 180 MG tablet Take 180 mg by mouth daily as needed for allergies.    [provider]  FIBER PO Take 1 capsule by mouth daily.    [provider]  fluticasone (FLONASE) 50 MCG/ACT nasal spray Place 2 sprays into both nostrils 2 (two) times daily.  09/14/19   [provider]  Fluticasone-Salmeterol (ADVAIR) 100-50 MCG/DOSE AEPB Inhale 1 puff into the lungs 2 (two) times daily. Patient not taking: No sig reported 09/14/19   [provider]  Fluticasone-Salmeterol (ADVAIR) 250-50 MCG/DOSE AEPB Inhale 1 puff into the lungs 2 (two) times daily. 09/14/19   [provider]  gabapentin (NEURONTIN) 300 MG  capsule Take 1 capsule (300 mg total) by mouth 3 (three) times daily. Patient taking differently: Take 300 mg by mouth See admin instructions. Take 300 mg by mouth in the morning, 900 mg in the afternoon and 300 mg at bedtime 04/26/20   Fritzi Mandes, MD  HYDROcodone-acetaminophen (NORCO) 5-325 MG tablet Take 1 tablet by mouth every 4 (four) hours as needed for up to 3 days for moderate pain. 06/27/20 06/30/20  Herbert Pun, MD  hydrOXYzine (ATARAX/VISTARIL) 25 MG tablet Take 25-50 mg by mouth at bedtime.    [provider]  Ibuprofen-Acetaminophen (ADVIL DUAL ACTION) 125-250 MG TABS Take 2 tablets by mouth daily as needed (pain/headache).    [provider]  magnesium oxide (MAG-OX) 400 (241.3 Mg) MG tablet Take 1 tablet (400 mg total) by mouth daily. Patient not taking: No sig reported 04/27/20   Fritzi Mandes, MD  medroxyPROGESTERone (DEPO-PROVERA) 150 MG/ML injection Inject 150 mg into the muscle every 3 (three) months.    [provider]  metoCLOPramide (REGLAN) 10 MG tablet Take 10 mg by mouth in the morning and at bedtime. 10/17/19   [provider]  ondansetron (ZOFRAN) 8 MG tablet Take 8 mg by mouth every 8 (eight) hours as needed for vomiting or nausea. 10/23/19   [provider]  oxybutynin (DITROPAN-XL) 10 MG 24 hr tablet Take 10 mg by mouth daily at 12 noon. 04/17/20   [provider]  pentosan polysulfate (ELMIRON) 100 MG capsule Take 100 mg by mouth at bedtime.    [provider]  potassium chloride SA (KLOR-CON) 20 MEQ tablet Take 2 tablets (40 mEq total) by mouth daily. Patient not taking: No sig reported 04/27/20   Fritzi Mandes, MD  promethazine (PHENERGAN) 25 MG tablet Take 25 mg by mouth every 8 (eight) hours as needed for nausea or vomiting.  10/23/19   [provider]  RABEprazole (ACIPHEX) 20 MG tablet Take 20 mg by mouth in the morning and at bedtime.  10/12/19   [provider]  sodium citrate-citric acid  (ORACIT) 500-334 MG/5ML solution Take 15 mLs by mouth in the morning and at bedtime. 05/30/20   [provider]  tamsulosin (FLOMAX) 0.4 MG CAPS capsule Take 0.4 mg by mouth at bedtime. 10/10/19   [provider]  theophylline (UNIPHYL) 400 MG 24 hr tablet Take 400 mg by mouth 2 (two) times daily.  10/10/19   [provider]  thiamine 100 MG tablet Take 1 tablet (100 mg total) by mouth daily. Patient not taking: No sig reported 11/03/19   Bonnielee Haff, MD  traMADol (ULTRAM) 50 MG tablet Take 50 mg by mouth every 6 (six) hours as needed for moderate pain.    [provider]  trimethoprim (TRIMPEX) 100 MG tablet Take 100 mg by mouth daily. 03/28/20   [provider]  TRULANCE 3 MG TABS Take 3 mg by mouth daily at 6 (six) AM.  10/12/19   [provider]   Current Facility-Administered Medications:  .  acetaminophen (TYLENOL) tablet 325  mg, 325 mg, Oral, Q6H PRN, 325 mg at 06/30/20 4917 **OR** acetaminophen (TYLENOL) suppository 325 mg, 325 mg, Rectal, Q6H PRN, Cox, Amy N, DO .  albuterol (PROVENTIL) (2.5 MG/3ML) 0.083% nebulizer solution 2.5 mg, 2.5 mg, Nebulization, Q4H PRN, Cox, Amy N, DO .  amitriptyline (ELAVIL) tablet 25-50 mg, 25-50 mg, Oral, QHS, Cox, Amy N, DO, 25 mg at 06/29/20 2325 .  diphenhydrAMINE (BENADRYL) capsule 25 mg, 25 mg, Oral, QHS PRN, Cox, Amy N, DO .  docusate sodium (COLACE) capsule 100 mg, 100 mg, Oral, BID, Cox, Amy N, DO, 100 mg at 06/29/20 2238 .  famotidine (PEPCID) tablet 40 mg, 40 mg, Oral, QHS, Cox, Amy N, DO, 40 mg at 06/29/20 2238 .  fluticasone (FLONASE) 50 MCG/ACT nasal spray 2 spray, 2 spray, Each Nare, BID, Cox, Amy N, DO, 2 spray at 06/30/20 0948 .  gabapentin (NEURONTIN) capsule 300 mg, 300 mg, Oral, BID, Cox, Amy N, DO, 300 mg at 06/29/20 2238 .  gabapentin (NEURONTIN) capsule 900 mg, 900 mg, Oral, Q1500, Cox, Amy N, DO, 900 mg at 06/30/20 1442 .  iohexol (OMNIPAQUE) 9 MG/ML oral solution 1,000 mL, 1,000 mL,  Oral, Once PRN, Cox, Amy N, DO, 1,000 mL at 06/29/20 1633 .  metoCLOPramide (REGLAN) tablet 10 mg, 10 mg, Oral, BID, Cox, Amy N, DO, 10 mg at 06/29/20 2238 .  mometasone-formoterol (DULERA) 100-5 MCG/ACT inhaler 2 puff, 2 puff, Inhalation, BID, Cox, Amy N, DO, 2 puff at 06/30/20 0949 .  morphine 4 MG/ML injection 4 mg, 4 mg, Intravenous, Q3H PRN, Cox, Amy N, DO, 4 mg at 06/30/20 1454 .  nicotine (NICODERM CQ - dosed in mg/24 hours) patch 21 mg, 21 mg, Transdermal, Daily PRN, Cox, Amy N, DO .  ondansetron (ZOFRAN) tablet 4 mg, 4 mg, Oral, Q6H PRN **OR** ondansetron (ZOFRAN) injection 4 mg, 4 mg, Intravenous, Q6H PRN, Cox, Amy N, DO .  oxybutynin (DITROPAN-XL) 24 hr tablet 10 mg, 10 mg, Oral, Q1200, Cox, Amy N, DO .  pantoprazole (PROTONIX) EC tablet 40 mg, 40 mg, Oral, Daily, Cox, Amy N, DO .  piperacillin-tazobactam (ZOSYN) IVPB 3.375 g, 3.375 g, Intravenous, Q8H, Cox, Amy N, DO, Last Rate: 12.5 mL/hr at 06/30/20 1518, Infusion Verify at 06/30/20 1518 .  senna-docusate (Senokot-S) tablet 2 tablet, 2 tablet, Oral, BID, Vann, Jessica U, DO .  sodium citrate-citric acid (ORACIT) solution 15 mL, 15 mL, Oral, BID, Vann, Jessica U, DO .  tamsulosin (FLOMAX) capsule 0.4 mg, 0.4 mg, Oral, QHS, Cox, Amy N, DO, 0.4 mg at 06/29/20 2237 .  theophylline (UNIPHYL) 400 MG 24 hr tablet 400 mg, 400 mg, Oral, BID, Cox, Amy N, DO, 400 mg at 06/29/20 2325 .  traMADol (ULTRAM) tablet 50 mg, 50 mg, Oral, Q6H PRN, Cox, Amy N, DO   Family History  Problem Relation Age of Onset  . Other Neg Hx      Social History   Tobacco Use  . Smoking status: Current Every Day Smoker  . Smokeless tobacco: Never Used  Substance Use Topics  . Alcohol use: Yes    Comment: last drink 03/2020  . Drug use: Yes    Types: Marijuana    Allergies as of 06/29/2020 - Review Complete 06/29/2020  Allergen Reaction Noted  . Cetirizine Hives 03/27/2013  . Diazepam Other (See Comments) 11/18/2014  . Baclofen Anxiety and Other (See  Comments) 09/12/2015    Review of Systems:    All systems reviewed and negative except where noted in HPI.  Physical Exam:  Vital signs in last 24 hours: Temp:  [97.7 F (36.5 C)-98.6 F (37 C)] 97.7 F (36.5 C) (03/10 0753) Pulse Rate:  [72-107] 107 (03/10 1638) Resp:  [16-18] 16 (03/10 1638) BP: (137-167)/(82-93) 157/93 (03/10 1638) SpO2:  [97 %-100 %] 98 % (03/10 1638) Last BM Date: 06/26/20 General:   Pleasant, cooperative in NAD Head:  Normocephalic and atraumatic. Eyes:   No icterus.   Conjunctiva pink. PERRLA. Ears:  Normal auditory acuity. Neck:  Supple; no masses or thyroidomegaly Lungs: Respirations even and unlabored. Lungs clear to auscultation bilaterally.   No wheezes, crackles, or rhonchi.  Heart:  Regular rate and rhythm;  Without murmur, clicks, rubs or gallops Abdomen:  Soft, distended, dull to percussion,generalized tenderness. No appreciable masses or hepatomegaly.  No rebound or guarding.  Rectal:  Not performed. Msk:  Symmetrical without gross deformities.  Strength normal Extremities:  Without edema, cyanosis or clubbing. Neurologic:  Alert and oriented x3;  grossly normal neurologically. Skin:  Intact without significant lesions or rashes. Psych:  Alert and cooperative. Normal affect.  LAB RESULTS: CBC Latest Ref Rng & Units 06/30/2020 06/29/2020 06/27/2020  WBC 4.0 - 10.5 K/uL 23.9(H) 30.4(H) -  Hemoglobin 12.0 - 15.0 g/dL 12.9 11.9(L) 10.9(L)  Hematocrit 36.0 - 46.0 % 41.1 38.9 32.0(L)  Platelets 150 - 400 K/uL 589(H) 555(H) -    BMET BMP Latest Ref Rng & Units 06/30/2020 06/29/2020 06/27/2020  Glucose 70 - 99 mg/dL 77 98 73  BUN 6 - 20 mg/dL 37(H) 38(H) 37(H)  Creatinine 0.44 - 1.00 mg/dL 1.50(H) 1.69(H) 1.86(H)  Sodium 135 - 145 mmol/L 137 134(L) 139  Potassium 3.5 - 5.1 mmol/L 5.5(H) 4.7 4.4  Chloride 98 - 111 mmol/L 113(H) 111 120(H)  CO2 22 - 32 mmol/L 14(L) 12(L) 13(L)  Calcium 8.9 - 10.3 mg/dL 8.8(L) 9.1 8.8(L)    LFT Hepatic Function  Latest Ref Rng & Units 06/30/2020 06/29/2020 06/23/2020  Total Protein 6.5 - 8.1 g/dL 6.2(L) 7.1 7.2  Albumin 3.5 - 5.0 g/dL 2.9(L) 3.5 3.5  AST 15 - 41 U/L 27 37 19  ALT 0 - 44 U/L 8 9 17   Alk Phosphatase 38 - 126 U/L 87 82 100  Total Bilirubin 0.3 - 1.2 mg/dL 0.7 0.6 0.6  Bilirubin, Direct 0.0 - 0.2 mg/dL <0.1 - -     STUDIES: DG Chest 1 View  Result Date: 06/29/2020 CLINICAL DATA:  Pleural effusion EXAM: CHEST  1 VIEW COMPARISON:  April 23, 2020 FINDINGS: The lung volumes are low. The heart size is unremarkable. There is vascular congestion with small bilateral pleural effusions. There are prominent interstitial lung markings. There is a linear opacity in the left lower lung zone favored to represent atelectasis. There is no acute osseous abnormality. The trachea is midline. IMPRESSION: Prominent interstitial lung markings with low lung volumes. Findings may be secondary to interstitial edema or an atypical infectious process. There are small bilateral pleural effusions. Electronically Signed   By: Constance Holster M.D.   On: 06/29/2020 18:45   CT Abdomen Pelvis W Contrast  Result Date: 06/29/2020 CLINICAL DATA:  Epigastric pain.  Increasing abdominal pain. EXAM: CT ABDOMEN AND PELVIS WITH CONTRAST TECHNIQUE: Multidetector CT imaging of the abdomen and pelvis was performed using the standard protocol following bolus administration of intravenous contrast. CONTRAST:  72m OMNIPAQUE IOHEXOL 300 MG/ML  SOLN COMPARISON:  CT 04/22/2020, abdominal ultrasound 04/23/2020 FINDINGS: Lower chest: Hypoventilatory changes in the lung bases. There is a small right pleural effusion. Hepatobiliary:  4 mm hypodensity in the periphery of the right hepatic lobe is too small to characterize. Coarse calcification in the subcapsular right lobe is unchanged. The gallbladder is decompressed. No calcified gallstones. There is extrahepatic biliary ductal dilatation with common bile duct measuring 11 mm. There is a  questionable intraluminal filling defect near the ampulla, series 2, image 41. Central intrahepatic biliary ductal dilatation. Pancreas: No ductal dilatation or inflammation. Spleen: Normal in size without focal abnormality. Adrenals/Urinary Tract: Punctate right adrenal calcifications. No nodule of either adrenal gland. Bilateral renal parenchymal atrophy in thinning of the renal parenchyma. Cortical scarring in the upper left kidney. Previous medullary calcinosis is partially obscured by IV contrast on the current exam. Calcification in the lower left kidney may be a nonobstructing stone or parenchymal. No hydronephrosis. There is absent renal excretion on delayed phase imaging. Stomach/Bowel: Bowel evaluation is limited in the absence of enteric contrast and paucity of intra-abdominal fat. Fluid distends the stomach. Question of gastric hyperemia and rugal fold thickening. Fluid within the proximal duodenum. Distal small bowel loops are fluid-filled and mildly prominent. More proximal small bowel are nondistended. No convincing small bowel inflammation. The appendix is visualized and normal. There is a large volume of stool in the ascending and transverse colon. Moderate stool in the descending colon. Proximal colon is diffusely tortuous. No colonic wall thickening. Sigmoid colon effaces the obturator canals without frank hernia. Vascular/Lymphatic: Age advanced aortic and branch atherosclerosis. No aneurysm. Atherosclerosis of the proximal common iliac arteries with mild luminal narrowing. Patent portal vein. No portal venous or mesenteric gas. No bulky abdominopelvic adenopathy. Reproductive: Atrophic uterus.  No adnexal mass. Other: Small volume abdominopelvic ascites. Mild edema of the intra-abdominal mesenteric fat. There is subcutaneous edema, which is confluent in the flanks. Few foci of soft tissue gas in the anterior abdominal wall, typical of medication injection sites. No loculated subcutaneous  collection. Musculoskeletal: Degenerative change at L4-L5. There are no acute or suspicious osseous abnormalities. IMPRESSION: 1. Intrahepatic and extrahepatic biliary ductal dilatation with questionable intraluminal filling defect near the ampulla. Recommend further correlation with LFTs. If elevated recommend evaluation with ERCP or MRCP. Gallbladder is decompressed. 2. Small volume abdominopelvic ascites. Small right pleural effusion. Body wall edema. Findings suggest with third-spacing. 3. Large volume of stool in the ascending and transverse colon with colonic tortuosity, can be seen with constipation. 4. Absent renal excretion on delayed phase imaging suggest underlying renal dysfunction. Mild bilateral renal atrophy. Nephrocalcinosis on prior noncontrast exam is not as well appreciated. 5. Age advanced aortic atherosclerosis. Aortic Atherosclerosis (ICD10-I70.0). Electronically Signed   By: Keith Rake M.D.   On: 06/29/2020 17:14   NM HEPATOBILIARY LEAK (POST-SURGICAL)  Result Date: 06/30/2020 CLINICAL DATA:  Recurrent abdominal pain. Status post cholecystectomy several days ago. Evaluate for bile leak. EXAM: NUCLEAR MEDICINE HEPATOBILIARY IMAGING TECHNIQUE: Sequential images of the abdomen were obtained out to 60 minutes following intravenous administration of radiopharmaceutical. RADIOPHARMACEUTICALS:  4.6 mCi Tc-67m Choletec IV COMPARISON:  None. FINDINGS: Prompt uptake and biliary excretion of activity by the liver is seen. No gallbladder activity is seen, consistent with prior cholecystectomy. Excreted biliary activity shows large leak with accumulation in the right perihepatic and subphrenic spaces and right paracolic gutter, consistent with postop bile leak. IMPRESSION: Prior cholecystectomy, with large post-op bile leak. These results will be called to the ordering clinician or representative by the Radiologist Assistant, and communication documented in the PACS or [employer]  Electronically Signed   By: JMyles RosenthalD.  On: 06/30/2020 15:14      Impression / Plan:   Katherine Perez is a 50 y.o. female with several GI issues, s/p laparoscopic cholecystectomy secondary to symptomatic cholelithiasis on 3/7 is admitted with epigastric pain, found to have postop bile leak.  CT also revealed intra and extrahepatic biliary ductal dilation.  No evidence of acute pancreatitis  Postop bile leak Maintain n.p.o. Continue empiric antibiotics Scheduled to undergo ERCP tomorrow  Intra and extrahepatic biliary ductal dilation on CT scan LFTs have been normal including alkaline phosphatase and bilirubin Patient will undergo ERCP as above tomorrow   Thank you for involving me in the care of this patient.      LOS: 0 days   Sherri Sear, MD  06/30/2020, 6:26 PM   Note: This dictation was prepared with Dragon dictation along with smaller phrase technology. Any transcriptional errors that result from this process are unintentional.

## 2020-06-30 NOTE — Plan of Care (Signed)

## 2020-06-30 NOTE — Progress Notes (Signed)
Patient ID: Tanaiyah Christie, female   DOB: 1970/11/15, 50 y.o.   MRN: [id number]     [facility] Day(s): 0.   Post op day(s):  Marland Kitchen   Interval History: Patient seen and examined, no acute events or new complaints overnight.  Patient requested to go home today.  She does endorses having pain but is improved compared to yesterday.  She denies any nausea or vomiting.  She denies any fever or chills.  Today the liver enzymes continue to be normal.  White blood cell decreased to 23,000 from 30,000.  If you compared the white blood cell count during the last year she has been between 15,000-22,000.  CT scan of the abdomen and pelvis yesterday shows no significant pathology.  Patient had ascites before the surgery.  Vital signs in last 24 hours: [min-max] current  Temp:  [97.7 F (36.5 C)-98.9 F (37.2 C)] 97.7 F (36.5 C) (03/10 0753) Pulse Rate:  [67-104] 90 (03/10 1117) Resp:  [16-18] 17 (03/10 1117) BP: (137-173)/(73-91) 137/84 (03/10 1117) SpO2:  [97 %-100 %] 98 % (03/10 1117) Weight:  [59 kg] 59 kg (03/09 1550)     Height: '5\' 9"'$  (175.3 cm) Weight: 59 kg BMI (Calculated): 19.19   Physical Exam:  Constitutional: alert, cooperative and no distress  Respiratory: breathing non-labored at rest  Cardiovascular: regular rate and sinus rhythm  Gastrointestinal: soft, non-tender, and non-distended  Labs:  CBC Latest Ref Rng & Units 06/30/2020 06/29/2020 06/27/2020  WBC 4.0 - 10.5 K/uL 23.9(H) 30.4(H) -  Hemoglobin 12.0 - 15.0 g/dL 12.9 11.9(L) 10.9(L)  Hematocrit 36.0 - 46.0 % 41.1 38.9 32.0(L)  Platelets 150 - 400 K/uL 589(H) 555(H) -   CMP Latest Ref Rng & Units 06/30/2020 06/29/2020 06/27/2020  Glucose 70 - 99 mg/dL 77 98 73  BUN 6 - 20 mg/dL 37(H) 38(H) 37(H)  Creatinine 0.44 - 1.00 mg/dL 1.50(H) 1.69(H) 1.86(H)  Sodium 135 - 145 mmol/L 137 134(L) 139  Potassium 3.5 - 5.1 mmol/L 5.5(H) 4.7 4.4  Chloride 98 - 111 mmol/L 113(H) 111 120(H)  CO2 22 - 32 mmol/L 14(L) 12(L) 13(L)   Calcium 8.9 - 10.3 mg/dL 8.8(L) 9.1 8.8(L)  Total Protein 6.5 - 8.1 g/dL 6.2(L) 7.1 -  Total Bilirubin 0.3 - 1.2 mg/dL 0.7 0.6 -  Alkaline Phos 38 - 126 U/L 87 82 -  AST 15 - 41 U/L 27 37 -  ALT 0 - 44 U/L 8 9 -    Imaging studies: I personally evaluated the images of the CT scan.  There is small amount of ascitic fluid which he had upon entering the abdomen due to cirrhosis.  There is no large fluid collection.   Assessment/Plan:  50 y.o. female with abdominal pain after cholecystectomy.  Patient initially came to the ED due to abdominal pain.  Upon evaluation at the ED she was found with 30,000 white blood cell count.  CT scan of the abdomen and pelvis did not show any significant abdominal pathology.  Due to her white blood cell count complications from cholecystectomy should be ruled out such as biliary leak, infected biloma, common bile duct injury.  I agree with initial evaluation with HIDA scan.  If this study is negative I will be low suspicious of complication from surgical management.  She has had a chronically elevated white blood cell count for the last year.  The liver enzyme has been normal that goes against a biliary obstruction.  There was no significant fluid collection  to suggest biloma.  If the pain is controlled and the HIDA scan is negative there will be no further surgical work-up needed.  Other causes of her abdominal pain can be her severe constipation seen on the CT scan.  I ordered an enema.  Arnold Long, MD

## 2020-07-01 ENCOUNTER — Encounter: Payer: Self-pay | Admitting: Internal Medicine

## 2020-07-01 ENCOUNTER — Inpatient Hospital Stay: Payer: BC Managed Care – PPO | Admitting: Certified Registered Nurse Anesthetist

## 2020-07-01 ENCOUNTER — Encounter
Admission: EM | Disposition: A | Discharge status: Home / Self Care | Payer: Self-pay | Source: Home / Self Care | Attending: Internal Medicine

## 2020-07-01 ENCOUNTER — Inpatient Hospital Stay: Payer: BC Managed Care – PPO

## 2020-07-01 DIAGNOSIS — K839 Disease of biliary tract, unspecified: Secondary | ICD-10-CM | POA: Diagnosis not present

## 2020-07-01 DIAGNOSIS — K838 Other specified diseases of biliary tract: Secondary | ICD-10-CM

## 2020-07-01 DIAGNOSIS — K819 Cholecystitis, unspecified: Secondary | ICD-10-CM

## 2020-07-01 HISTORY — PX: ERCP: SHX5425

## 2020-07-01 LAB — CBC
HCT: 39 % (ref 36.0–46.0)
Hemoglobin: 11.9 g/dL — ABNORMAL LOW (ref 12.0–15.0)
MCH: 32.2 pg (ref 26.0–34.0)
MCHC: 30.5 g/dL (ref 30.0–36.0)
MCV: 105.4 fL — ABNORMAL HIGH (ref 80.0–100.0)
Platelets: 568 10*3/uL — ABNORMAL HIGH (ref 150–400)
RBC: 3.7 MIL/uL — ABNORMAL LOW (ref 3.87–5.11)
RDW: 15.8 % — ABNORMAL HIGH (ref 11.5–15.5)
WBC: 25.8 10*3/uL — ABNORMAL HIGH (ref 4.0–10.5)
nRBC: 0 % (ref 0.0–0.2)

## 2020-07-01 LAB — BASIC METABOLIC PANEL
Anion gap: 12 (ref 5–15)
BUN: 37 mg/dL — ABNORMAL HIGH (ref 6–20)
CO2: 16 mmol/L — ABNORMAL LOW (ref 22–32)
Calcium: 9.2 mg/dL (ref 8.9–10.3)
Chloride: 114 mmol/L — ABNORMAL HIGH (ref 98–111)
Creatinine, Ser: 1.92 mg/dL — ABNORMAL HIGH (ref 0.44–1.00)
GFR, Estimated: 32 mL/min — ABNORMAL LOW (ref >60)
Glucose, Bld: 116 mg/dL — ABNORMAL HIGH (ref 70–99)
Potassium: 4.6 mmol/L (ref 3.5–5.1)
Sodium: 142 mmol/L (ref 135–145)

## 2020-07-01 SURGERY — ERCP, WITH INTERVENTION IF INDICATED
Anesthesia: General

## 2020-07-01 MED ORDER — TRAMADOL HCL 50 MG PO TABS
ORAL_TABLET | ORAL | Status: AC
  Filled 2020-07-01: qty 1

## 2020-07-01 MED ORDER — PROPOFOL 10 MG/ML IV BOLUS
INTRAVENOUS | Status: DC | PRN
  Administered 2020-07-01: 120 mg via INTRAVENOUS
  Administered 2020-07-01: 30 mg via INTRAVENOUS

## 2020-07-01 MED ORDER — ONDANSETRON HCL 4 MG/2ML IJ SOLN
INTRAMUSCULAR | Status: AC
  Filled 2020-07-01: qty 2

## 2020-07-01 MED ORDER — BISACODYL 5 MG PO TBEC
45.0000 mg | DELAYED_RELEASE_TABLET | Freq: Every day | ORAL | Status: DC
  Administered 2020-07-01: 45 mg via ORAL
  Filled 2020-07-01: qty 9

## 2020-07-01 MED ORDER — DEXAMETHASONE SODIUM PHOSPHATE 10 MG/ML IJ SOLN
INTRAMUSCULAR | Status: DC | PRN
  Administered 2020-07-01: 10 mg via INTRAVENOUS

## 2020-07-01 MED ORDER — GLYCOPYRROLATE 0.2 MG/ML IJ SOLN
INTRAMUSCULAR | Status: AC
  Filled 2020-07-01: qty 3

## 2020-07-01 MED ORDER — ONDANSETRON HCL 4 MG/2ML IJ SOLN
INTRAMUSCULAR | Status: DC | PRN
  Administered 2020-07-01: 4 mg via INTRAVENOUS

## 2020-07-01 MED ORDER — INDOMETHACIN 50 MG RE SUPP
100.0000 mg | Freq: Once | RECTAL | Status: AC
  Administered 2020-07-01: 100 mg via RECTAL

## 2020-07-01 MED ORDER — MIDAZOLAM HCL 2 MG/2ML IJ SOLN
INTRAMUSCULAR | Status: AC
  Filled 2020-07-01: qty 2

## 2020-07-01 MED ORDER — LIDOCAINE HCL (PF) 2 % IJ SOLN
INTRAMUSCULAR | Status: AC
  Filled 2020-07-01: qty 15

## 2020-07-01 MED ORDER — SODIUM CHLORIDE 0.9 % IV SOLN: INTRAVENOUS | Status: DC

## 2020-07-01 MED ORDER — LIDOCAINE HCL (CARDIAC) PF 100 MG/5ML IV SOSY
PREFILLED_SYRINGE | INTRAVENOUS | Status: DC | PRN
  Administered 2020-07-01: 50 mg via INTRAVENOUS

## 2020-07-01 MED ORDER — LACTATED RINGERS IV SOLN: INTRAVENOUS | Status: DC

## 2020-07-01 MED ORDER — PHENYLEPHRINE HCL (PRESSORS) 10 MG/ML IV SOLN
INTRAVENOUS | Status: DC | PRN
  Administered 2020-07-01 (×9): 100 ug via INTRAVENOUS

## 2020-07-01 MED ORDER — INDOMETHACIN 50 MG RE SUPP
RECTAL | Status: AC
  Filled 2020-07-01: qty 2

## 2020-07-01 MED ORDER — ROCURONIUM BROMIDE 100 MG/10ML IV SOLN
INTRAVENOUS | Status: DC | PRN
  Administered 2020-07-01 (×3): 10 mg via INTRAVENOUS

## 2020-07-01 MED ORDER — MIDAZOLAM HCL 2 MG/2ML IJ SOLN
INTRAMUSCULAR | Status: DC | PRN
  Administered 2020-07-01: 2 mg via INTRAVENOUS

## 2020-07-01 MED ORDER — FENTANYL CITRATE (PF) 100 MCG/2ML IJ SOLN: 25.0000 ug | INTRAMUSCULAR | Status: DC | PRN

## 2020-07-01 MED ORDER — FENTANYL CITRATE (PF) 100 MCG/2ML IJ SOLN
INTRAMUSCULAR | Status: DC | PRN
  Administered 2020-07-01: 50 ug via INTRAVENOUS
  Administered 2020-07-01 (×2): 25 ug via INTRAVENOUS

## 2020-07-01 MED ORDER — DEXAMETHASONE SODIUM PHOSPHATE 10 MG/ML IJ SOLN
INTRAMUSCULAR | Status: AC
  Filled 2020-07-01: qty 1

## 2020-07-01 MED ORDER — SENNOSIDES-DOCUSATE SODIUM 8.6-50 MG PO TABS
1.0000 | ORAL_TABLET | Freq: Every day | ORAL | Status: DC
  Administered 2020-07-01 – 2020-07-06 (×6): 1 via ORAL
  Filled 2020-07-01 (×6): qty 1

## 2020-07-01 MED ORDER — VASOPRESSIN 20 UNIT/ML IV SOLN
INTRAVENOUS | Status: DC | PRN
  Administered 2020-07-01: 1 [IU] via INTRAVENOUS
  Administered 2020-07-01 (×2): .5 [IU] via INTRAVENOUS

## 2020-07-01 MED ORDER — EPHEDRINE SULFATE 50 MG/ML IJ SOLN
INTRAMUSCULAR | Status: DC | PRN
  Administered 2020-07-01 (×2): 5 mg via INTRAVENOUS

## 2020-07-01 MED ORDER — SUCCINYLCHOLINE CHLORIDE 20 MG/ML IJ SOLN
INTRAMUSCULAR | Status: DC | PRN
  Administered 2020-07-01: 120 mg via INTRAVENOUS

## 2020-07-01 MED ORDER — FENTANYL CITRATE (PF) 100 MCG/2ML IJ SOLN
INTRAMUSCULAR | Status: AC
  Filled 2020-07-01: qty 2

## 2020-07-01 MED ORDER — SUGAMMADEX SODIUM 200 MG/2ML IV SOLN
INTRAVENOUS | Status: DC | PRN
  Administered 2020-07-01: 200 mg via INTRAVENOUS

## 2020-07-01 NOTE — Care Management (Signed)
Patient off the floor for procedure.  Now with elevated readmission risk score.  Will need TOC assessment.  TOC to follow up

## 2020-07-01 NOTE — Progress Notes (Signed)
Patient ID: Katherine Perez, female   DOB: 09-Nov-1970, 50 y.o.   MRN: [id number]     [facility] Day(s): 1.   Post op day(s): Day of Surgery.   Interval History: Patient seen and examined, no acute events or new complaints overnight. Patient reports feeling better today.  She reported that her abdomen is better because she has had multiple bowel movement.  She denies nausea or vomiting.  She reported passing gas.  She had ERCP today for bile leak was identified most likely from an accessory duct or a duct of L[place]chka.  Successful ERCP with stent placement.  Vital signs in last 24 hours: [min-max] current  Temp:  [97.5 F (36.4 C)-98 F (36.7 C)] 98 F (36.7 C) (03/11 1402) Pulse Rate:  [98-113] 98 (03/11 1402) Resp:  [15-26] 18 (03/11 1402) BP: (130-164)/(77-117) 149/86 (03/11 1402) SpO2:  [90 %-100 %] 91 % (03/11 1402)     Height: '5\' 9"'$  (175.3 cm) Weight: 59 kg BMI (Calculated): 19.19   Physical Exam:  Constitutional: alert, cooperative and no distress  Respiratory: breathing non-labored at rest  Cardiovascular: regular rate and sinus rhythm  Gastrointestinal: soft, mild-tender, and mild-distended  Labs:  CBC Latest Ref Rng & Units 07/01/2020 06/30/2020 06/29/2020  WBC 4.0 - 10.5 K/uL 25.8(H) 23.9(H) 30.4(H)  Hemoglobin 12.0 - 15.0 g/dL 11.9(L) 12.9 11.9(L)  Hematocrit 36.0 - 46.0 % 39.0 41.1 38.9  Platelets 150 - 400 K/uL 568(H) 589(H) 555(H)   CMP Latest Ref Rng & Units 07/01/2020 06/30/2020 06/29/2020  Glucose 70 - 99 mg/dL 116(H) 77 98  BUN 6 - 20 mg/dL 37(H) 37(H) 38(H)  Creatinine 0.44 - 1.00 mg/dL 1.92(H) 1.50(H) 1.69(H)  Sodium 135 - 145 mmol/L 142 137 134(L)  Potassium 3.5 - 5.1 mmol/L 4.6 5.5(H) 4.7  Chloride 98 - 111 mmol/L 114(H) 113(H) 111  CO2 22 - 32 mmol/L 16(L) 14(L) 12(L)  Calcium 8.9 - 10.3 mg/dL 9.2 8.8(L) 9.1  Total Protein 6.5 - 8.1 g/dL - 6.2(L) 7.1  Total Bilirubin 0.3 - 1.2 mg/dL - 0.7 0.6  Alkaline Phos 38 - 126 U/L - 87 82  AST 15 -  41 U/L - 27 37  ALT 0 - 44 U/L - 8 9    Imaging studies: No new pertinent imaging studies   Assessment/Plan:  50 y.o. female with bile leak s/p ERCP with stent placement.  Patient with successful ERCP with stent placement.  Patient having more bowel movement and feeling more comfortable after enema yesterday.  I think that her distention last night was due to the severe constipation.  I recommend to continue aggressive bowel regimen.  I recommend to continue IV antibiotic therapy.  I will start diet.  Appreciate hospitalist management of medical comorbidities and electrolytes.  I will continue to follow closely.  Arnold Long, MD

## 2020-07-01 NOTE — Op Note (Signed)
[facility] Gastroenterology Patient Name: Katherine Perez Procedure Date: 07/01/2020 9:50 AM MRN: [id number] Account #: [id number] Date of Birth: 1970/09/22 Admit Type: Inpatient Age: 50 Room: [facility] ENDO ROOM 4 Gender: Female Note Status: Finalized Procedure:             ERCP Indications:           Bile leak Providers:             Lucilla Lame MD, MD Medicines:             General Anesthesia Complications:         No immediate complications. Procedure:             Pre-Anesthesia Assessment:                        - Prior to the procedure, a History and Physical was                         performed, and patient medications and allergies were                         reviewed. The patient's tolerance of previous                         anesthesia was also reviewed. The risks and benefits                         of the procedure and the sedation options and risks                         were discussed with the patient. All questions were                         answered, and informed consent was obtained. Prior                         Anticoagulants: The patient has taken no previous                         anticoagulant or antiplatelet agents. ASA Grade                         Assessment: II - A patient with mild systemic disease.                         After reviewing the risks and benefits, the patient                         was deemed in satisfactory condition to undergo the                         procedure.                        After obtaining informed consent, the scope was passed                         under direct vision. Throughout the procedure, the  patient's blood pressure, pulse, and oxygen                         saturations were monitored continuously. The [employer] D single use duodenoscope was                         introduced through the mouth, and used to inject                          contrast into and used to cannulate the bile duct. The                         ERCP was technically difficult and complex due to                         inadequate patient positioning. Successful completion                         of the procedure was aided by withdrawing the scope                         and replacing with the ERCP scope. The patient                         tolerated the procedure well. Findings:      The scout film was normal. The esophagus was successfully intubated       under direct vision. The scope was advanced to a normal major papilla in       the descending duodenum without detailed examination of the pharynx,       larynx and associated structures, and upper GI tract. The upper GI tract       was grossly normal. The bile duct was deeply cannulated with the       short-nosed traction sphincterotome. Contrast was injected. I personally       interpreted the bile duct images. There was brisk flow of contrast       through the ducts. Image quality was excellent. Contrast extended to the       entire biliary tree. A 6 mm biliary sphincterotomy was made with a       braided traction (standard) sphincterotome using ERBE electrocautery.       There was no post-sphincterotomy bleeding. A double wire method was used       and after a submucosal injection of contrast the bile duct was       cannulated.Extravasation of contrast originating from the upper third of       the main bile duct was observed. One 10 Fr by 9 cm plastic stent with a       single external flap and a single internal flap was placed 8 cm into the       common bile duct. Bile flowed through the stent. The stent was in good       position. One 7 Fr by 7 cm plastic stent with a single external flap and       a single internal flap was placed 6 cm into the  common bile duct. Bile       flowed through the stent. The stent was in good position. Impression:            - A bile leak was found.                         - A biliary sphincterotomy was performed.                        - One plastic stent was placed into the common bile                         duct.                        - One plastic stent was placed into the common bile                         duct. Recommendation:        - Return patient to hospital ward for ongoing care.                        - Clear liquid diet today.                        - Repeat ERCP in 3 months to remove stent. Procedure Code(s):     --- Professional ---                        [phone], Endoscopic retrograde cholangiopancreatography                         (ERCP); with placement of endoscopic stent into                         biliary or pancreatic duct, including pre- and                         post-dilation and guide wire passage, when performed,                         including sphincterotomy, when performed, each stent                        [id number], 8, Endoscopic retrograde                         cholangiopancreatography (ERCP); with placement of                         endoscopic stent into biliary or pancreatic duct,                         including pre- and post-dilation and guide wire                         passage, when performed, including sphincterotomy,                         when performed, each stent  [id number], Endoscopic catheterization of the biliary                         ductal system, radiological supervision and                         interpretation Diagnosis Code(s):     --- Professional ---                        K83.8, Other specified diseases of biliary tract                        K83.9, Disease of biliary tract, unspecified CPT copyright 2019 American Medical Association. All rights reserved. The codes documented in this report are preliminary and upon coder review may  be revised to meet current compliance requirements. Lucilla Lame MD, MD 07/01/2020 12:37:04 PM This report has been signed  electronically. Number of Addenda: 0 Note Initiated On: 07/01/2020 9:50 AM Estimated Blood Loss:  Estimated blood loss: none.      [facility]

## 2020-07-01 NOTE — Anesthesia Postprocedure Evaluation (Signed)
Anesthesia Post Note  Patient: Patsey Berthold  Procedure(s) Performed: ENDOSCOPIC RETROGRADE CHOLANGIOPANCREATOGRAPHY (ERCP) (N/A )  Patient location during evaluation: PACU Anesthesia Type: General Level of consciousness: awake and alert Pain management: pain level controlled Vital Signs Assessment: post-procedure vital signs reviewed and stable Respiratory status: spontaneous breathing and respiratory function stable Cardiovascular status: stable Anesthetic complications: no   No complications documented.   Last Vitals:  Vitals:   07/01/20 0908 07/01/20 1247  BP: (!) 130/92 (!) 149/92  Pulse: (!) 113 (!) 110  Resp: 20 16  Temp: 36.7 C 36.4 C  SpO2: 100% 98%    Last Pain:  Vitals:   07/01/20 1247  TempSrc:   PainSc: Asleep                 Airika Alkhatib K

## 2020-07-01 NOTE — Anesthesia Preprocedure Evaluation (Addendum)
Anesthesia Evaluation  Patient identified by MRN, date of birth, ID band Patient awake    Reviewed: Allergy & Precautions, NPO status , Patient's Chart, lab work & pertinent test results  History of Anesthesia Complications Negative for: history of anesthetic complications  Airway Mallampati: II  TM Distance: >3 FB Neck ROM: Full    Dental no notable dental hx.    Pulmonary asthma , Current Smoker,    breath sounds clear to auscultation- rhonchi (-) wheezing      Cardiovascular Exercise Tolerance: Good (-) hypertension(-) CAD, (-) Past MI, (-) Cardiac Stents and (-) CABG  Rhythm:Regular Rate:Normal - Systolic murmurs and - Diastolic murmurs    Neuro/Psych neg Seizures PSYCHIATRIC DISORDERS (hx of EtOH abuse) negative neurological ROS     GI/Hepatic Neg liver ROS, GERD  ,  Endo/Other  negative endocrine ROSneg diabetes  Renal/GU CRFRenal disease     Musculoskeletal negative musculoskeletal ROS (+)   Abdominal (+) - obese,   Peds  Hematology  (+) anemia ,   Anesthesia Other Findings Past Medical History: No date: Anemia No date: Asthma No date: CKD (chronic kidney disease) No date: ETOH abuse No date: GERD (gastroesophageal reflux disease) No date: IC (interstitial cystitis) 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 No date: Neuropathy No date: Pneumonia No date: Raynaud disease   Reproductive/Obstetrics                             Anesthesia Physical  Anesthesia Plan  ASA: III  Anesthesia Plan: General   Post-op Pain Management:    Induction: Intravenous  PONV Risk Score and Plan: 1  Airway Management Planned: Oral ETT  Additional Equipment:   Intra-op Plan:   Post-operative Plan: Extubation in OR  Informed Consent: I have reviewed the patients History and Physical, chart, labs and discussed the procedure including the risks, benefits and alternatives for the proposed anesthesia with  the patient or authorized representative who has indicated his/her understanding and acceptance.     Dental advisory given  Plan Discussed with: CRNA and Anesthesiologist  Anesthesia Plan Comments:        Anesthesia Quick Evaluation

## 2020-07-01 NOTE — Transfer of Care (Signed)
Immediate Anesthesia Transfer of Care Note  Patient: Katherine Perez  Procedure(s) Performed: ENDOSCOPIC RETROGRADE CHOLANGIOPANCREATOGRAPHY (ERCP) (N/A )  Patient Location: PACU  Anesthesia Type:General  Level of Consciousness: drowsy  Airway & Oxygen Therapy: Patient Spontanous Breathing and Patient connected to face mask oxygen  Post-op Assessment: Report given to RN and Post -op Vital signs reviewed and stable  Post vital signs: Reviewed and stable  Last Vitals:  Vitals Value Taken Time  BP 149/92 07/01/20 1247  Temp 36.4 C 07/01/20 1247  Pulse 104 07/01/20 1250  Resp 15 07/01/20 1250  SpO2 95 % 07/01/20 1250  Vitals shown include unvalidated device data.  Last Pain:  Vitals:   07/01/20 1247  TempSrc:   PainSc: Asleep         Complications: No complications documented.

## 2020-07-01 NOTE — Progress Notes (Signed)
Progress Note    Katherine Perez  [id number] DOB: [birth year]  DOA: 06/29/2020 PCP: Gladstone Lighter, MD    Brief Narrative:     Medical records reviewed and are as summarized below:  Katherine Perez is an 50 y.o. female with medical history significant for recurrent UTI, dyspareunia, incomplete bladder emptying, anxiety, insomnia, asthma, seasonal allergies, neuropathy, presented to the emergency department for chief concerns of right upper quadrant abdominal pain.  Found to have a bile leak on NM scan.  S/p ERCP and stent  Assessment/Plan:   Active Problems:   Leukocytosis   GERD without esophagitis   CKD (chronic kidney disease) stage 3, GFR 30-59 ml/min (HCC)   Acalculous cholecystitis   Bile leak   Other specified diseases of biliary tract   Disease of biliary tract, unspecified   Right upper quadrant abdominal pain due to biliary leak -HIDA scan showed: bile leak -general surgery/GI consult -Zosyn IV continued -s/p ERCP and stent placement -trend labs  GERD -Famotidine 40 mg nightly ordered  Chronic constipation-suspect secondary to chronic pain management, resumed home metoclopramide 10 mg twice daily -enema -aggressive bowel regimen  Peripheral neuropathy-gabapentin resumed  Asthma/COPD-inhalers resumed -Theophylline 400 mg p.o. twice daily resumed -Flonase 2 spray each nare twice daily, Dulera 2 puffs inhalation twice daily, albuterol nebulizers every 4 hours as needed for wheezing and shortness of breath  Urinary retention-Flomax 0.5 mg nightly resumed  Hyperkalemia -resolved -trend labs  AKI on CKD IIIa -gentle IVF overnight -check labs in AM  alcohol use -monitor for withdrawal  Family Communication/Anticipated D/C date and plan/Code Status   DVT prophylaxis: Lovenox ordered. Code Status: Full Code.  Disposition Plan: Status is: inpt  The patient will require care spanning > 2 midnights and should be moved to inpatient because:  Inpatient level of care appropriate due to severity of illness  Dispo: The patient is from: Home              Anticipated d/c is to: Home              Patient currently is not medically stable to d/c.   Difficult to place patient No         Medical Consultants:    GS  GI   Subjective:   Going to ERCP, lots of questions about procedure  Objective:    Vitals:   07/01/20 0533 07/01/20 0534 07/01/20 0908 07/01/20 1247  BP: 130/77  (!) 130/92 (!) 149/92  Pulse: (!) 111 (!) 110 (!) 113 (!) 110  Resp: '20  20 16  '$ Temp: 97.8 F (36.6 C)  98 F (36.7 C) 97.6 F (36.4 C)  TempSrc: Oral     SpO2: 96% 95% 100% 98%  Weight:      Height:        Intake/Output Summary (Last 24 hours) at 07/01/2020 1313 Last data filed at 07/01/2020 1243 Gross per 24 hour  Intake 1007.34 ml  Output 400 ml  Net 607.34 ml   Filed Weights   06/29/20 1550  Weight: 59 kg    Exam:  General: Appearance:    Thin chronically ill appearing female in no acute distress   Tender to palpation, +BS  Lungs:     respirations unlabored  Heart:    Normal heart rate. Normal rhythm. No murmurs, rubs, or gallops.   MS:   All extremities are intact.   Neurologic:   Awake, alert, oriented x 3.     Data Reviewed:   I have  personally reviewed following labs and imaging studies:  Labs: Labs show the following:   Basic Metabolic Panel: Recent Labs  Lab 06/27/20 1042 06/27/20 1047 06/27/20 1335 06/29/20 1602 06/30/20 0447 07/01/20 0418  NA 139  --  139 134* 137 142  K 5.7*   < > 4.4 4.7 5.5* 4.6  CL 119*  --  120* 111 113* 114*  CO2  --   --  13* 12* 14* 16*  GLUCOSE 79  --  73 98 77 116*  BUN 34*  --  37* 38* 37* 37*  CREATININE 2.00*  --  1.86* 1.69* 1.50* 1.92*  CALCIUM  --   --  8.8* 9.1 8.8* 9.2   < > = values in this interval not displayed.   GFR Estimated Creatinine Clearance: 33.4 mL/min (A) (by C-G formula based on SCr of 1.92 mg/dL (H)). Liver Function Tests: Recent Labs  Lab  06/29/20 1602 06/30/20 0447  AST 37 27  ALT 9 8  ALKPHOS 82 87  BILITOT 0.6 0.7  PROT 7.1 6.2*  ALBUMIN 3.5 2.9*   Recent Labs  Lab 06/29/20 1602  LIPASE 38   No results for input(s): AMMONIA in the last 168 hours. Coagulation profile No results for input(s): INR, PROTIME in the last 168 hours.  CBC: Recent Labs  Lab 06/27/20 1042 06/29/20 1602 06/30/20 0447 07/01/20 0418  WBC  --  30.4* 23.9* 25.8*  HGB 10.9* 11.9* 12.9 11.9*  HCT 32.0* 38.9 41.1 39.0  MCV  --  106.0* 102.8* 105.4*  PLT  --  555* 589* 568*   Cardiac Enzymes: No results for input(s): CKTOTAL, CKMB, CKMBINDEX, TROPONINI in the last 168 hours. BNP (last 3 results) No results for input(s): PROBNP in the last 8760 hours. CBG: Recent Labs  Lab 06/27/20 1349 06/27/20 1620  GLUCAP 74 123*   D-Dimer: No results for input(s): DDIMER in the last 72 hours. Hgb A1c: No results for input(s): HGBA1C in the last 72 hours. Lipid Profile: No results for input(s): CHOL, HDL, LDLCALC, TRIG, CHOLHDL, LDLDIRECT in the last 72 hours. Thyroid function studies: No results for input(s): TSH, T4TOTAL, T3FREE, THYROIDAB in the last 72 hours.  Invalid input(s): FREET3 Anemia work up: No results for input(s): VITAMINB12, FOLATE, FERRITIN, TIBC, IRON, RETICCTPCT in the last 72 hours. Sepsis Labs: Recent Labs  Lab 06/29/20 1602 06/30/20 0447 07/01/20 0418  WBC 30.4* 23.9* 25.8*    Microbiology Recent Results (from the past 240 hour(s))  SARS CORONAVIRUS 2 (TAT 6-24 HRS) Nasopharyngeal Nasopharyngeal Swab     Status: None   Collection Time: 06/23/20 10:36 AM   Specimen: Nasopharyngeal Swab  Result Value Ref Range Status   SARS Coronavirus 2 NEGATIVE NEGATIVE Final    Comment: (NOTE) SARS-CoV-2 target nucleic acids are NOT DETECTED.  The SARS-CoV-2 RNA is generally detectable in upper and lower respiratory specimens during the acute phase of infection. Negative results do not preclude SARS-CoV-2 infection,  do not rule out co-infections with other pathogens, and should not be used as the sole basis for treatment or other patient management decisions. Negative results must be combined with clinical observations, patient history, and epidemiological information. The expected result is Negative.  Fact Sheet for Patients: SugarRoll.be  Fact Sheet for Healthcare Providers: https://www.woods-mathews.com/  This test is not yet approved or cleared by the [place] FDA and  has been authorized for detection and/or diagnosis of SARS-CoV-2 by FDA under an Emergency Use Authorization (EUA). This EUA will remain  in effect (meaning  this test can be used) for the duration of the COVID-19 declaration under Se ction 564(b)(1) of the Act, 21 U.S.C. section 360bbb-3(b)(1), unless the authorization is terminated or revoked sooner.  Performed at [facility], [place] [address], [place], [place] [address]   Urine Culture     Status: Abnormal (Preliminary result)   Collection Time: 06/29/20  4:02 PM   Specimen: Urine, Random  Result Value Ref Range Status   Specimen Description   Final    URINE, RANDOM Performed at [facility], [address], [place], [place] [address]    Special Requests   Final    NONE Performed at [facility], [address] [place], [place] [address]    Culture (A)  Final    >=100,000 COLONIES/mL KLEBSIELLA PNEUMONIAE SUSCEPTIBILITIES TO FOLLOW Performed at [facility], [place] [address], [place], [place] [address]    Report Status PENDING  Incomplete  Resp Panel by RT-PCR (Flu A&B, Covid) Nasopharyngeal Swab     Status: None   Collection Time: 06/29/20  7:11 PM   Specimen: Nasopharyngeal Swab; Nasopharyngeal(NP) swabs in vial transport medium  Result Value Ref Range Status   SARS Coronavirus 2 by RT PCR NEGATIVE NEGATIVE Final    Comment: (NOTE) SARS-CoV-2 target nucleic acids are NOT  DETECTED.  The SARS-CoV-2 RNA is generally detectable in upper respiratory specimens during the acute phase of infection. The lowest concentration of SARS-CoV-2 viral copies this assay can detect is 138 copies/mL. A negative result does not preclude SARS-Cov-2 infection and should not be used as the sole basis for treatment or other patient management decisions. A negative result may occur with  improper specimen collection/handling, submission of specimen other than nasopharyngeal swab, presence of viral mutation(s) within the areas targeted by this assay, and inadequate number of viral copies(<138 copies/mL). A negative result must be combined with clinical observations, patient history, and epidemiological information. The expected result is Negative.  Fact Sheet for Patients:  EntrepreneurPulse.com.au  Fact Sheet for Healthcare Providers:  IncredibleEmployment.be  This test is no t yet approved or cleared by the [place] FDA and  has been authorized for detection and/or diagnosis of SARS-CoV-2 by FDA under an Emergency Use Authorization (EUA). This EUA will remain  in effect (meaning this test can be used) for the duration of the COVID-19 declaration under Section 564(b)(1) of the Act, 21 U.S.C.section 360bbb-3(b)(1), unless the authorization is terminated  or revoked sooner.       Influenza A by PCR NEGATIVE NEGATIVE Final   Influenza B by PCR NEGATIVE NEGATIVE Final    Comment: (NOTE) The Xpert Xpress SARS-CoV-2/FLU/RSV plus assay is intended as an aid in the diagnosis of influenza from Nasopharyngeal swab specimens and should not be used as a sole basis for treatment. Nasal washings and aspirates are unacceptable for Xpert Xpress SARS-CoV-2/FLU/RSV testing.  Fact Sheet for Patients: EntrepreneurPulse.com.au  Fact Sheet for Healthcare Providers: IncredibleEmployment.be  This test is not yet  approved or cleared by the [place] FDA and has been authorized for detection and/or diagnosis of SARS-CoV-2 by FDA under an Emergency Use Authorization (EUA). This EUA will remain in effect (meaning this test can be used) for the duration of the COVID-19 declaration under Section 564(b)(1) of the Act, 21 U.S.C. section 360bbb-3(b)(1), unless the authorization is terminated or revoked.  Performed at [facility], [place], [place] [address]   CULTURE, BLOOD (ROUTINE X 2) w Reflex to ID Panel  Status: None (Preliminary result)   Collection Time: 06/29/20 10:18 PM   Specimen: BLOOD  Result Value Ref Range Status   Specimen Description BLOOD BLOOD RIGHT ARM  Final   Special Requests   Final    BOTTLES DRAWN AEROBIC AND ANAEROBIC Blood Culture results may not be optimal due to an inadequate volume of blood received in culture bottles   Culture   Final    NO GROWTH 2 DAYS Performed at [facility], [address], [place], [place] [address]    Report Status PENDING  Incomplete  CULTURE, BLOOD (ROUTINE X 2) w Reflex to ID Panel     Status: None (Preliminary result)   Collection Time: 06/29/20 10:34 PM   Specimen: BLOOD  Result Value Ref Range Status   Specimen Description BLOOD BLOOD RIGHT ARM  Final   Special Requests   Final    BOTTLES DRAWN AEROBIC AND ANAEROBIC Blood Culture results may not be optimal due to an inadequate volume of blood received in culture bottles   Culture   Final    NO GROWTH 2 DAYS Performed at [facility], [address] [place], [place] [address]    Report Status PENDING  Incomplete    Procedures and diagnostic studies:  CT ABDOMEN PELVIS WO CONTRAST  Result Date: 06/30/2020 CLINICAL DATA:  Increased abdominal distention. Post gallbladder surgery. Confirmed by leak with increased abdominal distension. EXAM: CT ABDOMEN AND PELVIS WITHOUT CONTRAST TECHNIQUE: Multidetector CT imaging of the abdomen and  pelvis was performed following the standard protocol without IV contrast. COMPARISON:  Contrast-enhanced CT yesterday FINDINGS: Lower chest: Small pleural effusions have increased in size. Adjacent compressive atelectasis. Hepatobiliary: Decreased common bile ductal dilatation, currently 9 mm, previously 11 mm. The questioned filling defect in the distal common bile duct on prior exam is not confidently visualized. Coarse calcification in the right hepatic lobe again seen. Previous subcentimeter low-density lesion is not seen on the current exam. Pancreas: No ductal dilatation or inflammation. Spleen: Normal in size without focal abnormality. Adrenals/Urinary Tract: No adrenal nodule. No hydronephrosis. There is residual excreted contrast in both renal collecting systems in the urinary bladder from contrast enhanced CT yesterday. Bilateral renal parenchymal atrophy. Stomach/Bowel: Distended stomach with intraluminal fluid. Small bowel is decompressed. Diffuse colonic distension with air, liquid and formed stool. Colon is diffusely tortuous as before. Normal appendix. Vascular/Lymphatic: Dense aortic atherosclerosis. No bulky abdominopelvic adenopathy. Reproductive: Atrophic uterus is partially obscured. No obvious adnexal mass. Other: Increased free fluid in the abdomen and pelvis, patient with known bile leak. Greatest fluid volume in the right upper quadrant, and dependently in the pelvis. There is increasing left lateral body wall edema as well as fluid tracking within the abdominal wall musculature. Increasing flank edema about the gluteal soft tissues. Patchy soft tissue gas in the anterior abdominal wall soft tissues. Musculoskeletal: There are no acute or suspicious osseous abnormalities. IMPRESSION: 1. Increased free fluid in the abdomen and pelvis, patient with known bile leak. Greatest fluid volume in the right upper quadrant, and dependently in the pelvis. 2. Decreased common bile ductal dilatation,  currently 9 mm, previously 11 mm. The questioned filling defect in the distal common bile duct on prior exam is not confidently visualized. 3. Diffuse colonic distension with air, liquid and formed stool. Colon is diffusely tortuous as before. 4. Small pleural effusions have increased in size. Adjacent compressive atelectasis. Aortic Atherosclerosis (ICD10-I70.0). Electronically Signed   By: Keith Rake M.D.   On: 06/30/2020 20:54  DG Chest 1 View  Result Date: 06/29/2020 CLINICAL DATA:  Pleural effusion EXAM: CHEST  1 VIEW COMPARISON:  April 23, 2020 FINDINGS: The lung volumes are low. The heart size is unremarkable. There is vascular congestion with small bilateral pleural effusions. There are prominent interstitial lung markings. There is a linear opacity in the left lower lung zone favored to represent atelectasis. There is no acute osseous abnormality. The trachea is midline. IMPRESSION: Prominent interstitial lung markings with low lung volumes. Findings may be secondary to interstitial edema or an atypical infectious process. There are small bilateral pleural effusions. Electronically Signed   By: Constance Holster M.D.   On: 06/29/2020 18:45   CT Abdomen Pelvis W Contrast  Result Date: 06/29/2020 CLINICAL DATA:  Epigastric pain.  Increasing abdominal pain. EXAM: CT ABDOMEN AND PELVIS WITH CONTRAST TECHNIQUE: Multidetector CT imaging of the abdomen and pelvis was performed using the standard protocol following bolus administration of intravenous contrast. CONTRAST:  1m OMNIPAQUE IOHEXOL 300 MG/ML  SOLN COMPARISON:  CT 04/22/2020, abdominal ultrasound 04/23/2020 FINDINGS: Lower chest: Hypoventilatory changes in the lung bases. There is a small right pleural effusion. Hepatobiliary: 4 mm hypodensity in the periphery of the right hepatic lobe is too small to characterize. Coarse calcification in the subcapsular right lobe is unchanged. The gallbladder is decompressed. No calcified gallstones.  There is extrahepatic biliary ductal dilatation with common bile duct measuring 11 mm. There is a questionable intraluminal filling defect near the ampulla, series 2, image 41. Central intrahepatic biliary ductal dilatation. Pancreas: No ductal dilatation or inflammation. Spleen: Normal in size without focal abnormality. Adrenals/Urinary Tract: Punctate right adrenal calcifications. No nodule of either adrenal gland. Bilateral renal parenchymal atrophy in thinning of the renal parenchyma. Cortical scarring in the upper left kidney. Previous medullary calcinosis is partially obscured by IV contrast on the current exam. Calcification in the lower left kidney may be a nonobstructing stone or parenchymal. No hydronephrosis. There is absent renal excretion on delayed phase imaging. Stomach/Bowel: Bowel evaluation is limited in the absence of enteric contrast and paucity of intra-abdominal fat. Fluid distends the stomach. Question of gastric hyperemia and rugal fold thickening. Fluid within the proximal duodenum. Distal small bowel loops are fluid-filled and mildly prominent. More proximal small bowel are nondistended. No convincing small bowel inflammation. The appendix is visualized and normal. There is a large volume of stool in the ascending and transverse colon. Moderate stool in the descending colon. Proximal colon is diffusely tortuous. No colonic wall thickening. Sigmoid colon effaces the obturator canals without frank hernia. Vascular/Lymphatic: Age advanced aortic and branch atherosclerosis. No aneurysm. Atherosclerosis of the proximal common iliac arteries with mild luminal narrowing. Patent portal vein. No portal venous or mesenteric gas. No bulky abdominopelvic adenopathy. Reproductive: Atrophic uterus.  No adnexal mass. Other: Small volume abdominopelvic ascites. Mild edema of the intra-abdominal mesenteric fat. There is subcutaneous edema, which is confluent in the flanks. Few foci of soft tissue gas in the  anterior abdominal wall, typical of medication injection sites. No loculated subcutaneous collection. Musculoskeletal: Degenerative change at L4-L5. There are no acute or suspicious osseous abnormalities. IMPRESSION: 1. Intrahepatic and extrahepatic biliary ductal dilatation with questionable intraluminal filling defect near the ampulla. Recommend further correlation with LFTs. If elevated recommend evaluation with ERCP or MRCP. Gallbladder is decompressed. 2. Small volume abdominopelvic ascites. Small right pleural effusion. Body wall edema. Findings suggest with third-spacing. 3. Large volume of stool in the ascending and transverse colon with colonic tortuosity, can be seen with constipation. 4.  Absent renal excretion on delayed phase imaging suggest underlying renal dysfunction. Mild bilateral renal atrophy. Nephrocalcinosis on prior noncontrast exam is not as well appreciated. 5. Age advanced aortic atherosclerosis. Aortic Atherosclerosis (ICD10-I70.0). Electronically Signed   By: Keith Rake M.D.   On: 06/29/2020 17:14   DG C-Arm 1-60 Min-No Report  Result Date: 07/01/2020 Fluoroscopy was utilized by the requesting physician.  No radiographic interpretation.   NM HEPATOBILIARY LEAK (POST-SURGICAL)  Result Date: 06/30/2020 CLINICAL DATA:  Recurrent abdominal pain. Status post cholecystectomy several days ago. Evaluate for bile leak. EXAM: NUCLEAR MEDICINE HEPATOBILIARY IMAGING TECHNIQUE: Sequential images of the abdomen were obtained out to 60 minutes following intravenous administration of radiopharmaceutical. RADIOPHARMACEUTICALS:  4.6 mCi Tc-63m Choletec IV COMPARISON:  None. FINDINGS: Prompt uptake and biliary excretion of activity by the liver is seen. No gallbladder activity is seen, consistent with prior cholecystectomy. Excreted biliary activity shows large leak with accumulation in the right perihepatic and subphrenic spaces and right paracolic gutter, consistent with postop bile leak.  IMPRESSION: Prior cholecystectomy, with large post-op bile leak. These results will be called to the ordering clinician or representative by the Radiologist Assistant, and communication documented in the PACS or [employer] Electronically Signed   By: JMarlaine HindM.D.   On: 06/30/2020 15:14    Medications:   . [MAR Hold] amitriptyline  25-50 mg Oral QHS  . [MAR Hold] docusate sodium  100 mg Oral BID  . [MAR Hold] famotidine  40 mg Oral QHS  . [MAR Hold] fluticasone  2 spray Each Nare BID  . [MAR Hold] gabapentin  300 mg Oral BID  . [MAR Hold] gabapentin  900 mg Oral Q1500  . indomethacin      . [MAR Hold] metoCLOPramide  10 mg Oral BID  . [MAR Hold] mometasone-formoterol  2 puff Inhalation BID  . [MAR Hold] nicotine  14 mg Transdermal Daily  . [MAR Hold] oxybutynin  10 mg Oral Q1200  . [MAR Hold] pantoprazole  40 mg Oral Daily  . [MAR Hold] sodium citrate-citric acid  15 mL Oral BID  . [MAR Hold] tamsulosin  0.4 mg Oral QHS  . [MAR Hold] theophylline  400 mg Oral BID   Continuous Infusions: . sodium chloride 20 mL/hr at 07/01/20 0141  . sodium chloride    . lactated ringers 50 mL/hr at 07/01/20 0928  . [MAR Hold] piperacillin-tazobactam (ZOSYN)  IV 3.375 g (07/01/20 0552)     LOS: 1 day   JGeradine Girt [facility]   How to contact the T[facility] Asc Project LLCAttending or Consulting provider [place] covering provider during after hours [place] for this patient?  1. Check the care team in [facility] look for a) attending/consulting [facility] provider listed and b) the TS[facility] Healthcareteam listed 2. Log into [email or web address] and use [place] universal password to access. If you do not have the password, please contact the hospital operator. 3. Locate the TH[facility] LLCprovider you are looking for under [facility] and page to a number that you can be directly reached. 4. If you still have difficulty reaching the provider, please page the [facility](Director on Call) for the Hospitalists listed on amion for  assistance.  07/01/2020, 1:13 PM

## 2020-07-01 NOTE — Anesthesia Procedure Notes (Signed)
Procedure Name: Intubation Date/Time: 07/01/2020 10:40 AM Performed by: Lily Peer, Navie Lamoreaux, CRNA Pre-anesthesia Checklist: Patient identified, Emergency Drugs available, Suction available and Patient being monitored Patient Re-evaluated:Patient Re-evaluated prior to induction Oxygen Delivery Method: Circle system utilized Preoxygenation: Pre-oxygenation with 100% oxygen Induction Type: IV induction Ventilation: Mask ventilation without difficulty Laryngoscope Size: McGraph and 3 Grade View: Grade I Tube type: Oral Tube size: 7.0 mm Number of attempts: 1 Airway Equipment and Method: Stylet Placement Confirmation: ETT inserted through vocal cords under direct vision,  positive ETCO2 and breath sounds checked- equal and bilateral Secured at: 21 cm Tube secured with: Tape Dental Injury: Teeth and Oropharynx as per pre-operative assessment

## 2020-07-02 ENCOUNTER — Inpatient Hospital Stay: Payer: BC Managed Care – PPO

## 2020-07-02 DIAGNOSIS — K839 Disease of biliary tract, unspecified: Secondary | ICD-10-CM | POA: Diagnosis not present

## 2020-07-02 DIAGNOSIS — R140 Abdominal distension (gaseous): Secondary | ICD-10-CM | POA: Diagnosis not present

## 2020-07-02 LAB — COMPREHENSIVE METABOLIC PANEL
ALT: 11 U/L (ref 0–44)
AST: 26 U/L (ref 15–41)
Albumin: 2.6 g/dL — ABNORMAL LOW (ref 3.5–5.0)
Alkaline Phosphatase: 144 U/L — ABNORMAL HIGH (ref 38–126)
Anion gap: 11 (ref 5–15)
BUN: 35 mg/dL — ABNORMAL HIGH (ref 6–20)
CO2: 14 mmol/L — ABNORMAL LOW (ref 22–32)
Calcium: 8.5 mg/dL — ABNORMAL LOW (ref 8.9–10.3)
Chloride: 111 mmol/L (ref 98–111)
Creatinine, Ser: 2.1 mg/dL — ABNORMAL HIGH (ref 0.44–1.00)
GFR, Estimated: 29 mL/min — ABNORMAL LOW (ref >60)
Glucose, Bld: 90 mg/dL (ref 70–99)
Potassium: 3.8 mmol/L (ref 3.5–5.1)
Sodium: 136 mmol/L (ref 135–145)
Total Bilirubin: 0.8 mg/dL (ref 0.3–1.2)
Total Protein: 5.9 g/dL — ABNORMAL LOW (ref 6.5–8.1)

## 2020-07-02 LAB — CBC
HCT: 29.3 % — ABNORMAL LOW (ref 36.0–46.0)
Hemoglobin: 9.4 g/dL — ABNORMAL LOW (ref 12.0–15.0)
MCH: 32.9 pg (ref 26.0–34.0)
MCHC: 32.1 g/dL (ref 30.0–36.0)
MCV: 102.4 fL — ABNORMAL HIGH (ref 80.0–100.0)
Platelets: 443 10*3/uL — ABNORMAL HIGH (ref 150–400)
RBC: 2.86 MIL/uL — ABNORMAL LOW (ref 3.87–5.11)
RDW: 15.3 % (ref 11.5–15.5)
WBC: 24.8 10*3/uL — ABNORMAL HIGH (ref 4.0–10.5)
nRBC: 0 % (ref 0.0–0.2)

## 2020-07-02 LAB — MAGNESIUM: Magnesium: 2.3 mg/dL (ref 1.7–2.4)

## 2020-07-02 LAB — URINE CULTURE: Culture: >=100000 — AB

## 2020-07-02 LAB — PHOSPHORUS: Phosphorus: 3.7 mg/dL (ref 2.5–4.6)

## 2020-07-02 MED ORDER — SODIUM CHLORIDE 0.9 % IV BOLUS
500.0000 mL | Freq: Once | INTRAVENOUS | Status: AC
  Administered 2020-07-02: 500 mL via INTRAVENOUS

## 2020-07-02 MED ORDER — SODIUM CHLORIDE 0.9 % IV SOLN
1.0000 g | Freq: Two times a day (BID) | INTRAVENOUS | Status: DC
  Administered 2020-07-02 – 2020-07-06 (×9): 1 g via INTRAVENOUS
  Filled 2020-07-02 (×12): qty 1

## 2020-07-02 MED ORDER — BISACODYL 5 MG PO TBEC
45.0000 mg | DELAYED_RELEASE_TABLET | Freq: Two times a day (BID) | ORAL | Status: DC
  Administered 2020-07-02 – 2020-07-06 (×9): 45 mg via ORAL
  Filled 2020-07-02 (×10): qty 9

## 2020-07-02 MED ORDER — DIPHENHYDRAMINE HCL 25 MG PO CAPS
25.0000 mg | ORAL_CAPSULE | Freq: Every evening | ORAL | Status: DC | PRN
  Administered 2020-07-02: 25 mg via ORAL
  Administered 2020-07-02: 50 mg via ORAL
  Administered 2020-07-03: 25 mg via ORAL
  Administered 2020-07-04 – 2020-07-05 (×2): 50 mg via ORAL
  Filled 2020-07-02 (×5): qty 2

## 2020-07-02 MED ORDER — AMITRIPTYLINE HCL 25 MG PO TABS
25.0000 mg | ORAL_TABLET | Freq: Every day | ORAL | Status: DC
  Administered 2020-07-02 – 2020-07-05 (×4): 25 mg via ORAL
  Filled 2020-07-02 (×5): qty 1

## 2020-07-02 NOTE — Progress Notes (Signed)
Progress Note    Katherine Perez  [id number] DOB: 1971/01/18  DOA: 06/29/2020 PCP: Gladstone Lighter, MD    Brief Narrative:     Medical records reviewed and are as summarized below:  Katherine Perez is an 50 y.o. female with medical history significant for recurrent UTI, dyspareunia, incomplete bladder emptying, anxiety, insomnia, asthma, seasonal allergies, neuropathy, presented to the emergency department for chief concerns of right upper quadrant abdominal pain.  Found to have a bile leak on NM scan.  S/p ERCP and stent  Assessment/Plan:   Active Problems:   Leukocytosis   GERD without esophagitis   CKD (chronic kidney disease) stage 3, GFR 30-59 ml/min (HCC)   Acalculous cholecystitis   Bile leak   Other specified diseases of biliary tract   Disease of biliary tract, unspecified   Right upper quadrant abdominal pain due to biliary leak -HIDA scan showed: bile leak -general surgery/GI consult -change IV zosyn to IV merrem in case she could have a UTI that is contributing to her WBCs -s/p ERCP and stent placement  ? UTI -change IV abx to merrem  Chronic constipation-suspect secondary to chronic pain management, resumed home metoclopramide 10 mg twice daily -enema -aggressive bowel regimen -check Mg/phos  Peripheral neuropathy-gabapentin resumed but will need lower dose due to renal failure  Asthma/COPD-inhalers resumed -Theophylline 400 mg p.o. twice daily resumed -Flonase 2 spray each nare twice daily, Dulera 2 puffs inhalation twice daily, albuterol nebulizers every 4 hours as needed for wheezing and shortness of breath  Urinary retention-Flomax 0.5 mg nightly resumed  Hyperkalemia -resolved  AKI on CKD IIIa -strict I/Os -PRN bladder scan -aggressive IVF -avoid nephrotoxic agents  alcohol use -monitor for withdrawal  Low threshold to transfer to higher level of care  Family Communication/Anticipated D/C date and plan/Code Status   DVT  prophylaxis: Lovenox when [place] with GI/GS Code Status: Full Code.  Disposition Plan: Status is: inpt  The patient will require care spanning > 2 midnights and should be moved to inpatient because: Inpatient level of care appropriate due to severity of illness  Dispo: The patient is from: Home              Anticipated d/c is to: Home              Patient currently is not medically stable to d/c.   Difficult to place patient No         Medical Consultants:    GS  GI   Subjective:   Just waking up, denies nausea  Objective:    Vitals:   07/01/20 1402 07/01/20 2143 07/01/20 2348 07/02/20 0320  BP: (!) 149/86 112/72 116/80 135/88  Pulse: 98 (!) 106 98 97  Resp: '18 16 20 16  '$ Temp: 98 F (36.7 C) 97.9 F (36.6 C) 98.5 F (36.9 C) 99.2 F (37.3 C)  TempSrc:   Oral Oral  SpO2: 91% 95% 98% 97%  Weight:      Height:        Intake/Output Summary (Last 24 hours) at 07/02/2020 1046 Last data filed at 07/02/2020 0537 Gross per 24 hour  Intake 1726.89 ml  Output 0 ml  Net 1726.89 ml   Filed Weights   06/29/20 1550  Weight: 59 kg    Exam:  General: Appearance:    Thin chronically ill appearing female in no acute distress     Lungs:      respirations unlabored  Heart:    Normal heart rate. Normal rhythm.  No murmurs, rubs, or gallops.   MS:   All extremities are intact.   Neurologic:   Awake, alert      Data Reviewed:   I have personally reviewed following labs and imaging studies:  Labs: Labs show the following:   Basic Metabolic Panel: Recent Labs  Lab 06/27/20 1335 06/29/20 1602 06/30/20 0447 07/01/20 0418 07/02/20 0605  NA 139 134* 137 142 136  K 4.4 4.7 5.5* 4.6 3.8  CL 120* 111 113* 114* 111  CO2 13* 12* 14* 16* 14*  GLUCOSE 73 98 77 116* 90  BUN 37* 38* 37* 37* 35*  CREATININE 1.86* 1.69* 1.50* 1.92* 2.10*  CALCIUM 8.8* 9.1 8.8* 9.2 8.5*   GFR Estimated Creatinine Clearance: 30.5 mL/min (A) (by C-G formula based on SCr of 2.1 mg/dL  (H)). Liver Function Tests: Recent Labs  Lab 06/29/20 1602 06/30/20 0447 07/02/20 0605  AST 37 27 26  ALT '9 8 11  '$ ALKPHOS 82 87 144*  BILITOT 0.6 0.7 0.8  PROT 7.1 6.2* 5.9*  ALBUMIN 3.5 2.9* 2.6*   Recent Labs  Lab 06/29/20 1602  LIPASE 38   No results for input(s): AMMONIA in the last 168 hours. Coagulation profile No results for input(s): INR, PROTIME in the last 168 hours.  CBC: Recent Labs  Lab 06/27/20 1042 06/29/20 1602 06/30/20 0447 07/01/20 0418 07/02/20 0605  WBC  --  30.4* 23.9* 25.8* 24.8*  HGB 10.9* 11.9* 12.9 11.9* 9.4*  HCT 32.0* 38.9 41.1 39.0 29.3*  MCV  --  106.0* 102.8* 105.4* 102.4*  PLT  --  555* 589* 568* 443*   Cardiac Enzymes: No results for input(s): CKTOTAL, CKMB, CKMBINDEX, TROPONINI in the last 168 hours. BNP (last 3 results) No results for input(s): PROBNP in the last 8760 hours. CBG: Recent Labs  Lab 06/27/20 1349 06/27/20 1620  GLUCAP 74 123*   D-Dimer: No results for input(s): DDIMER in the last 72 hours. Hgb A1c: No results for input(s): HGBA1C in the last 72 hours. Lipid Profile: No results for input(s): CHOL, HDL, LDLCALC, TRIG, CHOLHDL, LDLDIRECT in the last 72 hours. Thyroid function studies: No results for input(s): TSH, T4TOTAL, T3FREE, THYROIDAB in the last 72 hours.  Invalid input(s): FREET3 Anemia work up: No results for input(s): VITAMINB12, FOLATE, FERRITIN, TIBC, IRON, RETICCTPCT in the last 72 hours. Sepsis Labs: Recent Labs  Lab 06/29/20 1602 06/30/20 0447 07/01/20 0418 07/02/20 0605  WBC 30.4* 23.9* 25.8* 24.8*    Microbiology Recent Results (from the past 240 hour(s))  SARS CORONAVIRUS 2 (TAT 6-24 HRS) Nasopharyngeal Nasopharyngeal Swab     Status: None   Collection Time: 06/23/20 10:36 AM   Specimen: Nasopharyngeal Swab  Result Value Ref Range Status   SARS Coronavirus 2 NEGATIVE NEGATIVE Final    Comment: (NOTE) SARS-CoV-2 target nucleic acids are NOT DETECTED.  The SARS-CoV-2 RNA is  generally detectable in upper and lower respiratory specimens during the acute phase of infection. Negative results do not preclude SARS-CoV-2 infection, do not rule out co-infections with other pathogens, and should not be used as the sole basis for treatment or other patient management decisions. Negative results must be combined with clinical observations, patient history, and epidemiological information. The expected result is Negative.  Fact Sheet for Patients: SugarRoll.be  Fact Sheet for Healthcare Providers: https://www.woods-mathews.com/  This test is not yet approved or cleared by the [place] FDA and  has been authorized for detection and/or diagnosis of SARS-CoV-2 by FDA under an Emergency Use Authorization (EUA).  This EUA will remain  in effect (meaning this test can be used) for the duration of the COVID-19 declaration under Se ction 564(b)(1) of the Act, 21 U.S.C. section 360bbb-3(b)(1), unless the authorization is terminated or revoked sooner.  Performed at [facility], [place] [address], [place], [place] [address]   Urine Culture     Status: Abnormal   Collection Time: 06/29/20  4:02 PM   Specimen: Urine, Random  Result Value Ref Range Status   Specimen Description   Final    URINE, RANDOM Performed at [facility], [address] [place], [place] [address]    Special Requests   Final    NONE Performed at [facility], [address] [place], [place] [address]    Culture (A)  Final    >=100,000 COLONIES/mL KLEBSIELLA PNEUMONIAE Confirmed Extended Spectrum Beta-Lactamase Producer (ESBL).  In bloodstream infections from ESBL organisms, carbapenems are preferred over piperacillin/tazobactam. They are shown to have a lower risk of mortality.    Report Status 07/02/2020 FINAL  Final   Organism ID, Bacteria KLEBSIELLA PNEUMONIAE (A)  Final      Susceptibility   Klebsiella pneumoniae - MIC*     AMPICILLIN >=32 RESISTANT Resistant     CEFAZOLIN >=64 RESISTANT Resistant     CEFEPIME 2 SENSITIVE Sensitive     CEFTRIAXONE >=64 RESISTANT Resistant     CIPROFLOXACIN >=4 RESISTANT Resistant     GENTAMICIN >=16 RESISTANT Resistant     IMIPENEM <=0.25 SENSITIVE Sensitive     NITROFURANTOIN 64 INTERMEDIATE Intermediate     TRIMETH/SULFA >=320 RESISTANT Resistant     AMPICILLIN/SULBACTAM >=32 RESISTANT Resistant     PIP/TAZO 8 SENSITIVE Sensitive     * >=100,000 COLONIES/mL KLEBSIELLA PNEUMONIAE  Resp Panel by RT-PCR (Flu A&B, Covid) Nasopharyngeal Swab     Status: None   Collection Time: 06/29/20  7:11 PM   Specimen: Nasopharyngeal Swab; Nasopharyngeal(NP) swabs in vial transport medium  Result Value Ref Range Status   SARS Coronavirus 2 by RT PCR NEGATIVE NEGATIVE Final    Comment: (NOTE) SARS-CoV-2 target nucleic acids are NOT DETECTED.  The SARS-CoV-2 RNA is generally detectable in upper respiratory specimens during the acute phase of infection. The lowest concentration of SARS-CoV-2 viral copies this assay can detect is 138 copies/mL. A negative result does not preclude SARS-Cov-2 infection and should not be used as the sole basis for treatment or other patient management decisions. A negative result may occur with  improper specimen collection/handling, submission of specimen other than nasopharyngeal swab, presence of viral mutation(s) within the areas targeted by this assay, and inadequate number of viral copies(<138 copies/mL). A negative result must be combined with clinical observations, patient history, and epidemiological information. The expected result is Negative.  Fact Sheet for Patients:  EntrepreneurPulse.com.au  Fact Sheet for Healthcare Providers:  IncredibleEmployment.be  This test is no t yet approved or cleared by the [place] FDA and  has been authorized for detection and/or diagnosis of SARS-CoV-2 by FDA  under an Emergency Use Authorization (EUA). This EUA will remain  in effect (meaning this test can be used) for the duration of the COVID-19 declaration under Section 564(b)(1) of the Act, 21 U.S.C.section 360bbb-3(b)(1), unless the authorization is terminated  or revoked sooner.       Influenza A by PCR NEGATIVE NEGATIVE Final   Influenza B by PCR NEGATIVE NEGATIVE Final    Comment: (NOTE) The Xpert Xpress SARS-CoV-2/FLU/RSV plus assay is intended as an aid in  the diagnosis of influenza from Nasopharyngeal swab specimens and should not be used as a sole basis for treatment. Nasal washings and aspirates are unacceptable for Xpert Xpress SARS-CoV-2/FLU/RSV testing.  Fact Sheet for Patients: EntrepreneurPulse.com.au  Fact Sheet for Healthcare Providers: IncredibleEmployment.be  This test is not yet approved or cleared by the [place] FDA and has been authorized for detection and/or diagnosis of SARS-CoV-2 by FDA under an Emergency Use Authorization (EUA). This EUA will remain in effect (meaning this test can be used) for the duration of the COVID-19 declaration under Section 564(b)(1) of the Act, 21 U.S.C. section 360bbb-3(b)(1), unless the authorization is terminated or revoked.  Performed at [facility], [address] [place], [place] [address]   CULTURE, BLOOD (ROUTINE X 2) w Reflex to ID Panel     Status: None (Preliminary result)   Collection Time: 06/29/20 10:18 PM   Specimen: BLOOD  Result Value Ref Range Status   Specimen Description BLOOD BLOOD RIGHT ARM  Final   Special Requests   Final    BOTTLES DRAWN AEROBIC AND ANAEROBIC Blood Culture results may not be optimal due to an inadequate volume of blood received in culture bottles   Culture   Final    NO GROWTH 3 DAYS Performed at [facility], [address], [place], [place] [address]    Report Status PENDING  Incomplete  CULTURE, BLOOD (ROUTINE X 2)  w Reflex to ID Panel     Status: None (Preliminary result)   Collection Time: 06/29/20 10:34 PM   Specimen: BLOOD  Result Value Ref Range Status   Specimen Description BLOOD BLOOD RIGHT ARM  Final   Special Requests   Final    BOTTLES DRAWN AEROBIC AND ANAEROBIC Blood Culture results may not be optimal due to an inadequate volume of blood received in culture bottles   Culture   Final    NO GROWTH 3 DAYS Performed at [facility], [address], [place],  [address]    Report Status PENDING  Incomplete    Procedures and diagnostic studies:  CT ABDOMEN PELVIS WO CONTRAST  Result Date: 06/30/2020 CLINICAL DATA:  Increased abdominal distention. Post gallbladder surgery. Confirmed by leak with increased abdominal distension. EXAM: CT ABDOMEN AND PELVIS WITHOUT CONTRAST TECHNIQUE: Multidetector CT imaging of the abdomen and pelvis was performed following the standard protocol without IV contrast. COMPARISON:  Contrast-enhanced CT yesterday FINDINGS: Lower chest: Small pleural effusions have increased in size. Adjacent compressive atelectasis. Hepatobiliary: Decreased common bile ductal dilatation, currently 9 mm, previously 11 mm. The questioned filling defect in the distal common bile duct on prior exam is not confidently visualized. Coarse calcification in the right hepatic lobe again seen. Previous subcentimeter low-density lesion is not seen on the current exam. Pancreas: No ductal dilatation or inflammation. Spleen: Normal in size without focal abnormality. Adrenals/Urinary Tract: No adrenal nodule. No hydronephrosis. There is residual excreted contrast in both renal collecting systems in the urinary bladder from contrast enhanced CT yesterday. Bilateral renal parenchymal atrophy. Stomach/Bowel: Distended stomach with intraluminal fluid. Small bowel is decompressed. Diffuse colonic distension with air, liquid and formed stool. Colon is diffusely tortuous as before. Normal appendix.  Vascular/Lymphatic: Dense aortic atherosclerosis. No bulky abdominopelvic adenopathy. Reproductive: Atrophic uterus is partially obscured. No obvious adnexal mass. Other: Increased free fluid in the abdomen and pelvis, patient with known bile leak. Greatest fluid volume in the right upper quadrant, and dependently in the pelvis. There is increasing left lateral body wall edema as  well as fluid tracking within the abdominal wall musculature. Increasing flank edema about the gluteal soft tissues. Patchy soft tissue gas in the anterior abdominal wall soft tissues. Musculoskeletal: There are no acute or suspicious osseous abnormalities. IMPRESSION: 1. Increased free fluid in the abdomen and pelvis, patient with known bile leak. Greatest fluid volume in the right upper quadrant, and dependently in the pelvis. 2. Decreased common bile ductal dilatation, currently 9 mm, previously 11 mm. The questioned filling defect in the distal common bile duct on prior exam is not confidently visualized. 3. Diffuse colonic distension with air, liquid and formed stool. Colon is diffusely tortuous as before. 4. Small pleural effusions have increased in size. Adjacent compressive atelectasis. Aortic Atherosclerosis (ICD10-I70.0). Electronically Signed   By: Keith Rake M.D.   On: 06/30/2020 20:54   DG C-Arm 1-60 Min-No Report  Result Date: 07/01/2020 Fluoroscopy was utilized by the requesting physician.  No radiographic interpretation.   NM HEPATOBILIARY LEAK (POST-SURGICAL)  Result Date: 06/30/2020 CLINICAL DATA:  Recurrent abdominal pain. Status post cholecystectomy several days ago. Evaluate for bile leak. EXAM: NUCLEAR MEDICINE HEPATOBILIARY IMAGING TECHNIQUE: Sequential images of the abdomen were obtained out to 60 minutes following intravenous administration of radiopharmaceutical. RADIOPHARMACEUTICALS:  4.6 mCi Tc-59m Choletec IV COMPARISON:  None. FINDINGS: Prompt uptake and biliary excretion of activity by the liver  is seen. No gallbladder activity is seen, consistent with prior cholecystectomy. Excreted biliary activity shows large leak with accumulation in the right perihepatic and subphrenic spaces and right paracolic gutter, consistent with postop bile leak. IMPRESSION: Prior cholecystectomy, with large post-op bile leak. These results will be called to the ordering clinician or representative by the Radiologist Assistant, and communication documented in the PACS or [employer] Electronically Signed   By: JMarlaine HindM.D.   On: 06/30/2020 15:14    Medications:   . amitriptyline  25-50 mg Oral QHS  . bisacodyl  45 mg Oral BID  . docusate sodium  100 mg Oral BID  . famotidine  40 mg Oral QHS  . fluticasone  2 spray Each Nare BID  . gabapentin  300 mg Oral BID  . metoCLOPramide  10 mg Oral BID  . mometasone-formoterol  2 puff Inhalation BID  . nicotine  14 mg Transdermal Daily  . oxybutynin  10 mg Oral Q1200  . pantoprazole  40 mg Oral Daily  . senna-docusate  1 tablet Oral Daily  . sodium citrate-citric acid  15 mL Oral BID  . tamsulosin  0.4 mg Oral QHS  . theophylline  400 mg Oral BID   Continuous Infusions: . lactated ringers 100 mL/hr at 07/02/20 1002  . piperacillin-tazobactam (ZOSYN)  IV Stopped (07/02/20 0345)     LOS: 2 days   JGeradine Girt [facility]   How to contact the TPorter-Starke Services IncAttending or Consulting provider [place] covering provider during after hours [place] for this patient?  1. Check the care team in [facility]and look for a) attending/consulting TRH provider listed and b) the TThe [facility] Albanyteam listed 2. Log into [email or web address] and use [place]'s universal password to access. If you do not have the password, please contact the hospital operator. 3. Locate the TSt[facility] Point Surgery Center LLCprovider you are looking for under [facility] and page to a number that you can be directly reached. 4. If you still have difficulty reaching the provider, please page the [facility](Director on Call) for the  Hospitalists listed on amion for assistance.  07/02/2020, 10:46 AM

## 2020-07-02 NOTE — Consult Note (Signed)
Pharmacy Antibiotic Note  Katherine Perez is a 50 y.o. female admitted on 06/29/2020 with UTI. Ucx positive for Klebsiella, ESBL detected. Pharmacy has been consulted for Meropenem dosing.  Plan: Meropenem 1g Q12h   May adjust dose as renal function improves.  Height: '5\' 9"'$  (175.3 cm) Weight: 59 kg (130 lb) IBW/kg (Calculated) : 66.2  Temp (24hrs), Avg:98.6 F (37 C), Min:97.9 F (36.6 C), Max:99.2 F (37.3 C)  Recent Labs  Lab 06/27/20 1335 06/29/20 1602 06/30/20 0447 07/01/20 0418 07/02/20 0605  WBC  --  30.4* 23.9* 25.8* 24.8*  CREATININE 1.86* 1.69* 1.50* 1.92* 2.10*    Estimated Creatinine Clearance: 30.5 mL/min (A) (by C-G formula based on SCr of 2.1 mg/dL (H)).    Allergies  Allergen Reactions  . Cetirizine Hives  . Diazepam Other (See Comments)    Depression   . Baclofen Anxiety and Other (See Comments)    AMS - "Really messed me up, caused me to drop things"     Antimicrobials this admission: Zosyn 3/9 >> 3/11 Meropenem 3/12 >>   Microbiology results: 3/9 BCx: NGTD 3/9 UCx: Klebsiella Pneumoniae    Thank you for allowing pharmacy to be a part of this patient's care.  Jaselynn Tamas A Rafiel Mecca 07/02/2020 2:18 PM

## 2020-07-02 NOTE — Progress Notes (Signed)
Katherine Perez , MD [address] [place], [place], [place], [address] [address], [place], [place], [place], [address] Phone: [phone]  Fax: [phone]   Katherine Perez who is being followed following an ERCP and stent placement for bile leak  Subjective:  She hasno complaints, mild generalized abdominal distension  Objective: Vital signs in last 24 hours: Vitals:   07/01/20 1402 07/01/20 2143 07/01/20 2348 07/02/20 0320  BP: (!) 149/86 112/72 116/80 135/88  Pulse: 98 (!) 106 98 97  Resp: '18 16 20 16  '$ Temp: 98 F (36.7 C) 97.9 F (36.6 C) 98.5 F (36.9 C) 99.2 F (37.3 C)  TempSrc:   Oral Oral  SpO2: 91% 95% 98% 97%  Weight:      Height:       Weight change:   Intake/Output Summary (Last 24 hours) at 07/02/2020 0846 Last data filed at 07/02/2020 0537 Gross per 24 hour  Intake 1726.89 ml  Output 0 ml  Net 1726.89 ml     Exam: Heart:: Regular rate and rhythm, S1S2 present or without murmur or extra heart sounds Lungs: normal, clear to auscultation and clear to auscultation and percussion Abdomen: distended , soft , mild genera.lized tenderness   Lab Results: '@LABTEST2'$ @ Micro Results: Recent Results (from the past 240 hour(s))  SARS CORONAVIRUS 2 (TAT 6-24 HRS) Nasopharyngeal Nasopharyngeal Swab     Status: None   Collection Time: 06/23/20 10:36 AM   Specimen: Nasopharyngeal Swab  Result Value Ref Range Status   SARS Coronavirus 2 NEGATIVE NEGATIVE Final    Comment: (NOTE) SARS-CoV-2 target nucleic acids are NOT DETECTED.  The SARS-CoV-2 RNA is generally detectable in upper and lower respiratory specimens during the acute phase of infection. Negative results do not preclude SARS-CoV-2 infection, do not rule out co-infections with other pathogens, and should not be used as the sole basis for treatment or other patient management decisions. Negative results must be combined with clinical observations, patient history, and epidemiological  information. The expected result is Negative.  Fact Sheet for Patients: SugarRoll.be  Fact Sheet for Healthcare Providers: https://www.woods-mathews.com/  This test is not yet approved or cleared by the [place] FDA and  has been authorized for detection and/or diagnosis of SARS-CoV-2 by FDA under an Emergency Use Authorization (EUA). This EUA will remain  in effect (meaning this test can be used) for the duration of the COVID-19 declaration under Se ction 564(b)(1) of the Act, 21 U.S.C. section 360bbb-3(b)(1), unless the authorization is terminated or revoked sooner.  Performed at [facility], [place] [address], [place], [place] [address]   Urine Culture     Status: Abnormal (Preliminary result)   Collection Time: 06/29/20  4:02 PM   Specimen: Urine, Random  Result Value Ref Range Status   Specimen Description   Final    URINE, RANDOM Performed at [facility], [address], [place], [place] [address]    Special Requests   Final    NONE Performed at [facility], [address] [place], [place] [address]    Culture (A)  Final    >=100,000 COLONIES/mL KLEBSIELLA PNEUMONIAE SUSCEPTIBILITIES TO FOLLOW Performed at [facility], [place] [address] [place], [place] [address]    Report Status PENDING  Incomplete  Resp Panel by RT-PCR (Flu A&B, Covid) Nasopharyngeal Swab     Status: None   Collection Time: 06/29/20  7:11 PM   Specimen: Nasopharyngeal Swab; Nasopharyngeal(NP) swabs in vial transport medium  Result Value  Ref Range Status   SARS Coronavirus 2 by RT PCR NEGATIVE NEGATIVE Final    Comment: (NOTE) SARS-CoV-2 target nucleic acids are NOT DETECTED.  The SARS-CoV-2 RNA is generally detectable in upper respiratory specimens during the acute phase of infection. The lowest concentration of SARS-CoV-2 viral copies this assay can detect is 138 copies/mL. A negative result does not preclude  SARS-Cov-2 infection and should not be used as the sole basis for treatment or other patient management decisions. A negative result may occur with  improper specimen collection/handling, submission of specimen other than nasopharyngeal swab, presence of viral mutation(s) within the areas targeted by this assay, and inadequate number of viral copies(<138 copies/mL). A negative result must be combined with clinical observations, patient history, and epidemiological information. The expected result is Negative.  Fact Sheet for Patients:  EntrepreneurPulse.com.au  Fact Sheet for Healthcare Providers:  IncredibleEmployment.be  This test is no t yet approved or cleared by the [place] FDA and  has been authorized for detection and/or diagnosis of SARS-CoV-2 by FDA under an Emergency Use Authorization (EUA). This EUA will remain  in effect (meaning this test can be used) for the duration of the COVID-19 declaration under Section 564(b)(1) of the Act, 21 U.S.C.section 360bbb-3(b)(1), unless the authorization is terminated  or revoked sooner.       Influenza A by PCR NEGATIVE NEGATIVE Final   Influenza B by PCR NEGATIVE NEGATIVE Final    Comment: (NOTE) The Xpert Xpress SARS-CoV-2/FLU/RSV plus assay is intended as an aid in the diagnosis of influenza from Nasopharyngeal swab specimens and should not be used as a sole basis for treatment. Nasal washings and aspirates are unacceptable for Xpert Xpress SARS-CoV-2/FLU/RSV testing.  Fact Sheet for Patients: EntrepreneurPulse.com.au  Fact Sheet for Healthcare Providers: IncredibleEmployment.be  This test is not yet approved or cleared by the [place] FDA and has been authorized for detection and/or diagnosis of SARS-CoV-2 by FDA under an Emergency Use Authorization (EUA). This EUA will remain in effect (meaning this test can be used) for the duration of  the COVID-19 declaration under Section 564(b)(1) of the Act, 21 U.S.C. section 360bbb-3(b)(1), unless the authorization is terminated or revoked.  Performed at [facility], [place] [place], [place] [address]   CULTURE, BLOOD (ROUTINE X 2) w Reflex to ID Panel     Status: None (Preliminary result)   Collection Time: 06/29/20 10:18 PM   Specimen: BLOOD  Result Value Ref Range Status   Specimen Description BLOOD BLOOD RIGHT ARM  Final   Special Requests   Final    BOTTLES DRAWN AEROBIC AND ANAEROBIC Blood Culture results may not be optimal due to an inadequate volume of blood received in culture bottles   Culture   Final    NO GROWTH 3 DAYS Performed at [facility], [address] [place], [place] [address]    Report Status PENDING  Incomplete  CULTURE, BLOOD (ROUTINE X 2) w Reflex to ID Panel     Status: None (Preliminary result)   Collection Time: 06/29/20 10:34 PM   Specimen: BLOOD  Result Value Ref Range Status   Specimen Description BLOOD BLOOD RIGHT ARM  Final   Special Requests   Final    BOTTLES DRAWN AEROBIC AND ANAEROBIC Blood Culture results may not be optimal due to an inadequate volume of blood received in culture bottles   Culture   Final    NO GROWTH 3 DAYS Performed at [facility], [address] [place], [place] [address]    Report Status PENDING  Incomplete   Studies/Results: CT ABDOMEN PELVIS WO CONTRAST  Result Date: 06/30/2020 CLINICAL DATA:  Increased abdominal distention. Post gallbladder surgery. Confirmed by leak with increased abdominal distension. EXAM: CT ABDOMEN AND PELVIS WITHOUT CONTRAST TECHNIQUE: Multidetector CT imaging of the abdomen and pelvis was performed following the standard protocol without IV contrast. COMPARISON:  Contrast-enhanced CT yesterday FINDINGS: Lower chest: Small pleural effusions have increased in size. Adjacent compressive atelectasis. Hepatobiliary: Decreased common bile ductal  dilatation, currently 9 mm, previously 11 mm. The questioned filling defect in the distal common bile duct on prior exam is not confidently visualized. Coarse calcification in the right hepatic lobe again seen. Previous subcentimeter low-density lesion is not seen on the current exam. Pancreas: No ductal dilatation or inflammation. Spleen: Normal in size without focal abnormality. Adrenals/Urinary Tract: No adrenal nodule. No hydronephrosis. There is residual excreted contrast in both renal collecting systems in the urinary bladder from contrast enhanced CT yesterday. Bilateral renal parenchymal atrophy. Stomach/Bowel: Distended stomach with intraluminal fluid. Small bowel is decompressed. Diffuse colonic distension with air, liquid and formed stool. Colon is diffusely tortuous as before. Normal appendix. Vascular/Lymphatic: Dense aortic atherosclerosis. No bulky abdominopelvic adenopathy. Reproductive: Atrophic uterus is partially obscured. No obvious adnexal mass. Other: Increased free fluid in the abdomen and pelvis, patient with known bile leak. Greatest fluid volume in the right upper quadrant, and dependently in the pelvis. There is increasing left lateral body wall edema as well as fluid tracking within the abdominal wall musculature. Increasing flank edema about the gluteal soft tissues. Patchy soft tissue gas in the anterior abdominal wall soft tissues. Musculoskeletal: There are no acute or suspicious osseous abnormalities. IMPRESSION: 1. Increased free fluid in the abdomen and pelvis, patient with known bile leak. Greatest fluid volume in the right upper quadrant, and dependently in the pelvis. 2. Decreased common bile ductal dilatation, currently 9 mm, previously 11 mm. The questioned filling defect in the distal common bile duct on prior exam is not confidently visualized. 3. Diffuse colonic distension with air, liquid and formed stool. Colon is diffusely tortuous as before. 4. Small pleural effusions  have increased in size. Adjacent compressive atelectasis. Aortic Atherosclerosis (ICD10-I70.0). Electronically Signed   By: Keith Rake M.D.   On: 06/30/2020 20:54   DG C-Arm 1-60 Min-No Report  Result Date: 07/01/2020 Fluoroscopy was utilized by the requesting physician.  No radiographic interpretation.   NM HEPATOBILIARY LEAK (POST-SURGICAL)  Result Date: 06/30/2020 CLINICAL DATA:  Recurrent abdominal pain. Status post cholecystectomy several days ago. Evaluate for bile leak. EXAM: NUCLEAR MEDICINE HEPATOBILIARY IMAGING TECHNIQUE: Sequential images of the abdomen were obtained out to 60 minutes following intravenous administration of radiopharmaceutical. RADIOPHARMACEUTICALS:  4.6 mCi Tc-86m Choletec IV COMPARISON:  None. FINDINGS: Prompt uptake and biliary excretion of activity by the liver is seen. No gallbladder activity is seen, consistent with prior cholecystectomy. Excreted biliary activity shows large leak with accumulation in the right perihepatic and subphrenic spaces and right paracolic gutter, consistent with postop bile leak. IMPRESSION: Prior cholecystectomy, with large post-op bile leak. These results will be called to the ordering clinician or representative by the Radiologist Assistant, and communication documented in the PACS or [employer] Electronically Signed   By: JMarlaine HindM.D.   On: 06/30/2020 15:14   Medications: I have reviewed the patient's current medications. Scheduled Meds: . amitriptyline  25-50 mg Oral QHS  . bisacodyl  45 mg Oral Daily  . docusate sodium  100 mg Oral BID  . famotidine  40 mg Oral QHS  . fluticasone  2 spray Each Nare BID  . gabapentin  300 mg Oral BID  . gabapentin  900 mg Oral Q1500  . metoCLOPramide  10 mg Oral BID  . mometasone-formoterol  2 puff Inhalation BID  . nicotine  14 mg Transdermal Daily  . oxybutynin  10 mg Oral Q1200  . pantoprazole  40 mg Oral Daily  . senna-docusate  1 tablet Oral Daily  . sodium  citrate-citric acid  15 mL Oral BID  . tamsulosin  0.4 mg Oral QHS  . theophylline  400 mg Oral BID   Continuous Infusions: . lactated ringers 50 mL/hr at 07/02/20 0537  . piperacillin-tazobactam (ZOSYN)  IV Stopped (07/02/20 0345)  . sodium chloride     PRN Meds:.acetaminophen **OR** acetaminophen, albuterol, diphenhydrAMINE, iohexol, morphine injection, ondansetron **OR** ondansetron (ZOFRAN) IV, traMADol   Assessment: Active Problems:   Leukocytosis   GERD without esophagitis   CKD (chronic kidney disease) stage 3, GFR 30-59 ml/min (HCC)   Acalculous cholecystitis   Bile leak   Other specified diseases of biliary tract   Disease of biliary tract, unspecified   Katherine Perez 50 y.o. female underwent ERCP on 07/01/2020 with stent placement for bile leak. Overnight some drop in Hb , abdomen has some distension which she says has been going on for a while , Ct scan on 06/30/2020 shows diffuse distension with stool and air    Plan: 1.  Repeat ERCP in 3 months to remove stent 2. Monitor CBC closely  3. Suggest bowel clean out with miralax or with 1/3 bottle of golytely to clean out , limit narcotics and keep K> 4 , Mg > 2 , iv fluids since creatinine has been gradually rising , serial abdominal exam   LOS: 2 days   Katherine Bellows, MD 07/02/2020, 8:46 AM

## 2020-07-02 NOTE — Progress Notes (Signed)
Patient ID: Katherine Perez, female   DOB: [birth year], 50 y.o.   MRN: [id number]     [facility] Day(s): 2.   Post op day(s): 1 Day Post-Op.   Interval History: Patient seen and examined, no acute events or new complaints overnight. Patient reports still having abdominal distention.  Pain elevated but improved.  Denies any nausea or vomiting.  She continue passing gas.  She did not have a bowel movement after ERCP.  Vital signs in last 24 hours: [min-max] current  Temp:  [97.6 F (36.4 C)-99.2 F (37.3 C)] 99.2 F (37.3 C) (03/12 0320) Pulse Rate:  [97-110] 97 (03/12 0320) Resp:  [15-26] 16 (03/12 0320) BP: (112-164)/(72-117) 135/88 (03/12 0320) SpO2:  [90 %-98 %] 97 % (03/12 0320)     Height: '5\' 9"'$  (175.3 cm) Weight: 59 kg BMI (Calculated): 19.19   Physical Exam:  Constitutional: alert, cooperative and no distress  Respiratory: breathing non-labored at rest  Cardiovascular: regular rate and sinus rhythm  Gastrointestinal: soft, mild-tender, and mild-distended  Labs:  CBC Latest Ref Rng & Units 07/02/2020 07/01/2020 06/30/2020  WBC 4.0 - 10.5 K/uL 24.8(H) 25.8(H) 23.9(H)  Hemoglobin 12.0 - 15.0 g/dL 9.4(L) 11.9(L) 12.9  Hematocrit 36.0 - 46.0 % 29.3(L) 39.0 41.1  Platelets 150 - 400 K/uL 443(H) 568(H) 589(H)   CMP Latest Ref Rng & Units 07/02/2020 07/01/2020 06/30/2020  Glucose 70 - 99 mg/dL 90 116(H) 77  BUN 6 - 20 mg/dL 35(H) 37(H) 37(H)  Creatinine 0.44 - 1.00 mg/dL 2.10(H) 1.92(H) 1.50(H)  Sodium 135 - 145 mmol/L 136 142 137  Potassium 3.5 - 5.1 mmol/L 3.8 4.6 5.5(H)  Chloride 98 - 111 mmol/L 111 114(H) 113(H)  CO2 22 - 32 mmol/L 14(L) 16(L) 14(L)  Calcium 8.9 - 10.3 mg/dL 8.5(L) 9.2 8.8(L)  Total Protein 6.5 - 8.1 g/dL 5.9(L) - 6.2(L)  Total Bilirubin 0.3 - 1.2 mg/dL 0.8 - 0.7  Alkaline Phos 38 - 126 U/L 144(H) - 87  AST 15 - 41 U/L 26 - 27  ALT 0 - 44 U/L 11 - 8    Imaging studies: No new pertinent imaging studies   Assessment/Plan:  50 y.o.  female with dilated 1 Day Post-Op s/p ERCP with stent placement.  Bile leak: Patient treated with ERCP and stent placement.  Doing well.  No sign of complication from the procedure.  Constipation: Patient with chronic constipation.  Her constipation is so severe that her gastroenterologist recommended total colectomy.  We will increase her bowel regimen.  Abdominal distention: Abdominal distention can be combination of increased intra-abdominal fluid from bile leak and her severe constipation.  I will follow abdominal x-ray today to see if there is intestinal distention.  If there is no improvement in the next 24 hours I will repeat CT scan to see if there is any fluid collection that needs to be drained percutaneously.  Otherwise we will continue with aggressive bowel regimen for her severe constipation.  Appreciate hospitalist management of other medical comorbidities such as acute kidney injury.  Agreed to continue antibiotic therapy.  Appreciate GI service management and follow-up.  I encouraged the patient to ambulate.  Arnold Long, MD

## 2020-07-03 LAB — IRON AND TIBC
Iron: 21 ug/dL — ABNORMAL LOW (ref 28–170)
Saturation Ratios: 9 % — ABNORMAL LOW (ref 10.4–31.8)
TIBC: 225 ug/dL — ABNORMAL LOW (ref 250–450)
UIBC: 204 ug/dL

## 2020-07-03 LAB — COMPREHENSIVE METABOLIC PANEL
ALT: 11 U/L (ref 0–44)
AST: 21 U/L (ref 15–41)
Albumin: 2.5 g/dL — ABNORMAL LOW (ref 3.5–5.0)
Alkaline Phosphatase: 130 U/L — ABNORMAL HIGH (ref 38–126)
Anion gap: 8 (ref 5–15)
BUN: 29 mg/dL — ABNORMAL HIGH (ref 6–20)
CO2: 16 mmol/L — ABNORMAL LOW (ref 22–32)
Calcium: 8.7 mg/dL — ABNORMAL LOW (ref 8.9–10.3)
Chloride: 109 mmol/L (ref 98–111)
Creatinine, Ser: 1.83 mg/dL — ABNORMAL HIGH (ref 0.44–1.00)
GFR, Estimated: 34 mL/min — ABNORMAL LOW (ref >60)
Glucose, Bld: 84 mg/dL (ref 70–99)
Potassium: 3.1 mmol/L — ABNORMAL LOW (ref 3.5–5.1)
Sodium: 133 mmol/L — ABNORMAL LOW (ref 135–145)
Total Bilirubin: 0.8 mg/dL (ref 0.3–1.2)
Total Protein: 5.8 g/dL — ABNORMAL LOW (ref 6.5–8.1)

## 2020-07-03 LAB — CBC
HCT: 28.3 % — ABNORMAL LOW (ref 36.0–46.0)
Hemoglobin: 8.7 g/dL — ABNORMAL LOW (ref 12.0–15.0)
MCH: 32 pg (ref 26.0–34.0)
MCHC: 30.7 g/dL (ref 30.0–36.0)
MCV: 104 fL — ABNORMAL HIGH (ref 80.0–100.0)
Platelets: 438 10*3/uL — ABNORMAL HIGH (ref 150–400)
RBC: 2.72 MIL/uL — ABNORMAL LOW (ref 3.87–5.11)
RDW: 15.1 % (ref 11.5–15.5)
WBC: 17.6 10*3/uL — ABNORMAL HIGH (ref 4.0–10.5)
nRBC: 0 % (ref 0.0–0.2)

## 2020-07-03 LAB — FERRITIN: Ferritin: 356 ng/mL — ABNORMAL HIGH (ref 11–307)

## 2020-07-03 MED ORDER — POTASSIUM CHLORIDE CRYS ER 20 MEQ PO TBCR
40.0000 meq | EXTENDED_RELEASE_TABLET | Freq: Once | ORAL | Status: AC
  Administered 2020-07-03: 40 meq via ORAL
  Filled 2020-07-03: qty 2

## 2020-07-03 MED ORDER — HYDROCODONE-ACETAMINOPHEN 5-325 MG PO TABS
1.0000 | ORAL_TABLET | ORAL | Status: DC | PRN
  Administered 2020-07-03 – 2020-07-06 (×16): 2 via ORAL
  Filled 2020-07-03 (×16): qty 2

## 2020-07-03 MED ORDER — CETAPHIL MOISTURIZING EX LOTN
TOPICAL_LOTION | CUTANEOUS | Status: DC | PRN
  Filled 2020-07-03: qty 473

## 2020-07-03 MED ORDER — FOLIC ACID 1 MG PO TABS
1.0000 mg | ORAL_TABLET | Freq: Every day | ORAL | Status: DC
  Administered 2020-07-03 – 2020-07-06 (×4): 1 mg via ORAL
  Filled 2020-07-03 (×4): qty 1

## 2020-07-03 MED ORDER — FLEET ENEMA 7-19 GM/118ML RE ENEM: 1.0000 | ENEMA | Freq: Once | RECTAL | Status: DC

## 2020-07-03 MED ORDER — ENOXAPARIN SODIUM 40 MG/0.4ML ~~LOC~~ SOLN
40.0000 mg | SUBCUTANEOUS | Status: DC
  Administered 2020-07-03 – 2020-07-06 (×4): 40 mg via SUBCUTANEOUS
  Filled 2020-07-03 (×4): qty 0.4

## 2020-07-03 MED ORDER — THIAMINE HCL 100 MG/ML IJ SOLN
100.0000 mg | Freq: Every day | INTRAMUSCULAR | Status: DC
  Administered 2020-07-04 – 2020-07-06 (×3): 100 mg via INTRAVENOUS
  Filled 2020-07-03 (×3): qty 2

## 2020-07-03 MED ORDER — MORPHINE SULFATE (PF) 2 MG/ML IV SOLN: 2.0000 mg | INTRAVENOUS | Status: DC | PRN

## 2020-07-03 MED ORDER — OXYBUTYNIN CHLORIDE ER 10 MG PO TB24
10.0000 mg | ORAL_TABLET | Freq: Every day | ORAL | Status: DC
  Administered 2020-07-04 – 2020-07-05 (×2): 10 mg via ORAL
  Filled 2020-07-03 (×3): qty 1

## 2020-07-03 NOTE — Progress Notes (Signed)
PHARMACIST - PHYSICIAN COMMUNICATION  CONCERNING:  Enoxaparin (Lovenox) for DVT Prophylaxis    RECOMMENDATION: Patient was prescribed enoxaprin '30mg'$  q24 hours for VTE prophylaxis.   Filed Weights   06/29/20 1550  Weight: 59 kg (130 lb)    Body mass index is 19.2 kg/m.  Estimated Creatinine Clearance: 35 mL/min (A) (by C-G formula based on SCr of 1.83 mg/dL (H)).   Patient is candidate for enoxaparin '40mg'$  every 24 hours based on CrCl >7m/min   DESCRIPTION: [facility].  Patient is now receiving enoxaparin 30 mg every 24 hours    CLu Duffel PharmD Clinical Pharmacist  07/03/2020 11:29 AM

## 2020-07-03 NOTE — Progress Notes (Signed)
Progress Note    Katherine Perez  [id number] DOB: 1971/01/10  DOA: 06/29/2020 PCP: Gladstone Lighter, MD    Brief Narrative:     Medical records reviewed and are as summarized below:  Katherine Perez is an 50 y.o. female with medical history significant for recurrent UTI, dyspareunia, incomplete bladder emptying, anxiety, insomnia, asthma, seasonal allergies, neuropathy, presented to the emergency department for chief concerns of right upper quadrant abdominal pain.  Found to have a bile leak on NM scan.  S/p ERCP and stent.  Remains SEVERELY constipated.  Assessment/Plan:   Active Problems:   Leukocytosis   GERD without esophagitis   CKD (chronic kidney disease) stage 3, GFR 30-59 ml/min (HCC)   Acalculous cholecystitis   Bile leak   Other specified diseases of biliary tract   Disease of biliary tract, unspecified   Right upper quadrant abdominal pain due to biliary leak -HIDA scan showed: bile leak -general surgery/GI consult -change IV zosyn to IV merrem in case she could have a UTI that is contributing to her WBCs -s/p ERCP and stent placement  ? UTI -change IV abx to merrem  Chronic constipation-suspect secondary to chronic pain management, resumed home metoclopramide 10 mg twice daily -enema again today -encouraged ambulation -aggressive bowel regimen  Peripheral neuropathy-gabapentin resumed but will need lower dose due to renal failure  Asthma/COPD-inhalers resumed -Theophylline 400 mg p.o. twice daily resumed -Flonase 2 spray each nare twice daily, Dulera 2 puffs inhalation twice daily, albuterol nebulizers every 4 hours as needed for wheezing and shortness of breath  Urinary retention-Flomax 0.5 mg nightly resumed  Hyperkalemia now hypokalemia -replete for bowel function to 4  AKI on CKD IIIa -strict I/Os -PRN bladder scan -aggressive IVF -avoid nephrotoxic agents  alcohol use -monitor for withdrawal MCV> 100- will add vitamins and check  levels  Low threshold to transfer to higher level of care  Family Communication/Anticipated D/C date and plan/Code Status   DVT prophylaxis: Lovenox when Capron with GI/GS Code Status: Full Code.  Disposition Plan: Status is: inpt  The patient will require care spanning > 2 midnights and should be moved to inpatient because: Inpatient level of care appropriate due to severity of illness  Dispo: The patient is from: Home              Anticipated d/c is to: Home              Patient currently is not medically stable to d/c.   Difficult to place patient No         Medical Consultants:    GS  GI   Subjective:  Skin is dry Tolerating diet Small BM yesterday  Objective:    Vitals:   07/02/20 1704 07/02/20 2212 07/03/20 0548 07/03/20 0926  BP: 117/69 118/76 115/72   Pulse: 90 (!) 106 100 (!) 105  Resp: '16 18  20  '$ Temp: 98.2 F (36.8 C) 98 F (36.7 C) 98.3 F (36.8 C) 98.9 F (37.2 C)  TempSrc:  Oral Oral Oral  SpO2: 99% 97% 97% 98%  Weight:      Height:        Intake/Output Summary (Last 24 hours) at 07/03/2020 1048 Last data filed at 07/02/2020 1800 Gross per 24 hour  Intake 250 ml  Output 400 ml  Net -150 ml   Filed Weights   06/29/20 1550  Weight: 59 kg    Exam:  General: Appearance:    Thin female in no acute distress  Abdomen softer today  Lungs:     respirations unlabored  Heart:    Tachycardic. Normal rhythm. No murmurs, rubs, or gallops.   MS:   All extremities are intact.   Neurologic:   Awake, alert, oriented x 3       Data Reviewed:   I have personally reviewed following labs and imaging studies:  Labs: Labs show the following:   Basic Metabolic Panel: Recent Labs  Lab 06/29/20 1602 06/30/20 0447 07/01/20 0418 07/02/20 0605 07/02/20 1200 07/03/20 0529  NA 134* 137 142 136  --  133*  K 4.7 5.5* 4.6 3.8  --  3.1*  CL 111 113* 114* 111  --  109  CO2 12* 14* 16* 14*  --  16*  GLUCOSE 98 77 116* 90  --  84  BUN 38* 37* 37*  35*  --  29*  CREATININE 1.69* 1.50* 1.92* 2.10*  --  1.83*  CALCIUM 9.1 8.8* 9.2 8.5*  --  8.7*  MG  --   --   --   --  2.3  --   PHOS  --   --   --   --  3.7  --    GFR Estimated Creatinine Clearance: 35 mL/min (A) (by C-G formula based on SCr of 1.83 mg/dL (H)). Liver Function Tests: Recent Labs  Lab 06/29/20 1602 06/30/20 0447 07/02/20 0605 07/03/20 0529  AST 37 '27 26 21  '$ ALT '9 8 11 11  '$ ALKPHOS 82 87 144* 130*  BILITOT 0.6 0.7 0.8 0.8  PROT 7.1 6.2* 5.9* 5.8*  ALBUMIN 3.5 2.9* 2.6* 2.5*   Recent Labs  Lab 06/29/20 1602  LIPASE 38   No results for input(s): AMMONIA in the last 168 hours. Coagulation profile No results for input(s): INR, PROTIME in the last 168 hours.  CBC: Recent Labs  Lab 06/29/20 1602 06/30/20 0447 07/01/20 0418 07/02/20 0605 07/03/20 0529  WBC 30.4* 23.9* 25.8* 24.8* 17.6*  HGB 11.9* 12.9 11.9* 9.4* 8.7*  HCT 38.9 41.1 39.0 29.3* 28.3*  MCV 106.0* 102.8* 105.4* 102.4* 104.0*  PLT 555* 589* 568* 443* 438*   Cardiac Enzymes: No results for input(s): CKTOTAL, CKMB, CKMBINDEX, TROPONINI in the last 168 hours. BNP (last 3 results) No results for input(s): PROBNP in the last 8760 hours. CBG: Recent Labs  Lab 06/27/20 1349 06/27/20 1620  GLUCAP 74 123*   D-Dimer: No results for input(s): DDIMER in the last 72 hours. Hgb A1c: No results for input(s): HGBA1C in the last 72 hours. Lipid Profile: No results for input(s): CHOL, HDL, LDLCALC, TRIG, CHOLHDL, LDLDIRECT in the last 72 hours. Thyroid function studies: No results for input(s): TSH, T4TOTAL, T3FREE, THYROIDAB in the last 72 hours.  Invalid input(s): FREET3 Anemia work up: Recent Labs    07/03/20 0529  FERRITIN 356*  TIBC 225*  IRON 21*   Sepsis Labs: Recent Labs  Lab 06/30/20 0447 07/01/20 0418 07/02/20 0605 07/03/20 0529  WBC 23.9* 25.8* 24.8* 17.6*    Microbiology Recent Results (from the past 240 hour(s))  SARS CORONAVIRUS 2 (TAT 6-24 HRS) Nasopharyngeal  Nasopharyngeal Swab     Status: None   Collection Time: 06/23/20 10:36 AM   Specimen: Nasopharyngeal Swab  Result Value Ref Range Status   SARS Coronavirus 2 NEGATIVE NEGATIVE Final    Comment: (NOTE) SARS-CoV-2 target nucleic acids are NOT DETECTED.  The SARS-CoV-2 RNA is generally detectable in upper and lower respiratory specimens during the acute phase of infection. Negative results do not preclude  SARS-CoV-2 infection, do not rule out co-infections with other pathogens, and should not be used as the sole basis for treatment or other patient management decisions. Negative results must be combined with clinical observations, patient history, and epidemiological information. The expected result is Negative.  Fact Sheet for Patients: SugarRoll.be  Fact Sheet for Healthcare Providers: https://www.woods-mathews.com/  This test is not yet approved or cleared by the [place] FDA and  has been authorized for detection and/or diagnosis of SARS-CoV-2 by FDA under an Emergency Use Authorization (EUA). This EUA will remain  in effect (meaning this test can be used) for the duration of the COVID-19 declaration under Se ction 564(b)(1) of the Act, 21 U.S.C. section 360bbb-3(b)(1), unless the authorization is terminated or revoked sooner.  Performed at [facility], [place] [address] [place], [place] [address]   Urine Culture     Status: Abnormal   Collection Time: 06/29/20  4:02 PM   Specimen: Urine, Random  Result Value Ref Range Status   Specimen Description   Final    URINE, RANDOM Performed at [facility], [address], [place], [place] [address]    Special Requests   Final    NONE Performed at [facility], [address] [place], [place] [address]    Culture (A)  Final    >=100,000 COLONIES/mL KLEBSIELLA PNEUMONIAE Confirmed Extended Spectrum Beta-Lactamase Producer (ESBL).  In bloodstream infections  from ESBL organisms, carbapenems are preferred over piperacillin/tazobactam. They are shown to have a lower risk of mortality.    Report Status 07/02/2020 FINAL  Final   Organism ID, Bacteria KLEBSIELLA PNEUMONIAE (A)  Final      Susceptibility   Klebsiella pneumoniae - MIC*    AMPICILLIN >=32 RESISTANT Resistant     CEFAZOLIN >=64 RESISTANT Resistant     CEFEPIME 2 SENSITIVE Sensitive     CEFTRIAXONE >=64 RESISTANT Resistant     CIPROFLOXACIN >=4 RESISTANT Resistant     GENTAMICIN >=16 RESISTANT Resistant     IMIPENEM <=0.25 SENSITIVE Sensitive     NITROFURANTOIN 64 INTERMEDIATE Intermediate     TRIMETH/SULFA >=320 RESISTANT Resistant     AMPICILLIN/SULBACTAM >=32 RESISTANT Resistant     PIP/TAZO 8 SENSITIVE Sensitive     * >=100,000 COLONIES/mL KLEBSIELLA PNEUMONIAE  Resp Panel by RT-PCR (Flu A&B, Covid) Nasopharyngeal Swab     Status: None   Collection Time: 06/29/20  7:11 PM   Specimen: Nasopharyngeal Swab; Nasopharyngeal(NP) swabs in vial transport medium  Result Value Ref Range Status   SARS Coronavirus 2 by RT PCR NEGATIVE NEGATIVE Final    Comment: (NOTE) SARS-CoV-2 target nucleic acids are NOT DETECTED.  The SARS-CoV-2 RNA is generally detectable in upper respiratory specimens during the acute phase of infection. The lowest concentration of SARS-CoV-2 viral copies this assay can detect is 138 copies/mL. A negative result does not preclude SARS-Cov-2 infection and should not be used as the sole basis for treatment or other patient management decisions. A negative result may occur with  improper specimen collection/handling, submission of specimen other than nasopharyngeal swab, presence of viral mutation(s) within the areas targeted by this assay, and inadequate number of viral copies(<138 copies/mL). A negative result must be combined with clinical observations, patient history, and epidemiological information. The expected result is Negative.  Fact Sheet for Patients:   EntrepreneurPulse.com.au  Fact Sheet for Healthcare Providers:  IncredibleEmployment.be  This test is no t yet approved or cleared by the [place] and  has been authorized for  detection and/or diagnosis of SARS-CoV-2 by FDA under an Emergency Use Authorization (EUA). This EUA will remain  in effect (meaning this test can be used) for the duration of the COVID-19 declaration under Section 564(b)(1) of the Act, 21 U.S.C.section 360bbb-3(b)(1), unless the authorization is terminated  or revoked sooner.       Influenza A by PCR NEGATIVE NEGATIVE Final   Influenza B by PCR NEGATIVE NEGATIVE Final    Comment: (NOTE) The Xpert Xpress SARS-CoV-2/FLU/RSV plus assay is intended as an aid in the diagnosis of influenza from Nasopharyngeal swab specimens and should not be used as a sole basis for treatment. Nasal washings and aspirates are unacceptable for Xpert Xpress SARS-CoV-2/FLU/RSV testing.  Fact Sheet for Patients: EntrepreneurPulse.com.au  Fact Sheet for Healthcare Providers: IncredibleEmployment.be  This test is not yet approved or cleared by the [place] FDA and has been authorized for detection and/or diagnosis of SARS-CoV-2 by FDA under an Emergency Use Authorization (EUA). This EUA will remain in effect (meaning this test can be used) for the duration of the COVID-19 declaration under Section 564(b)(1) of the Act, 21 U.S.C. section 360bbb-3(b)(1), unless the authorization is terminated or revoked.  Performed at [facility], [address] [place], [place] [address]   CULTURE, BLOOD (ROUTINE X 2) w Reflex to ID Panel     Status: None (Preliminary result)   Collection Time: 06/29/20 10:18 PM   Specimen: BLOOD  Result Value Ref Range Status   Specimen Description BLOOD BLOOD RIGHT ARM  Final   Special Requests   Final    BOTTLES DRAWN AEROBIC AND ANAEROBIC Blood Culture  results may not be optimal due to an inadequate volume of blood received in culture bottles   Culture   Final    NO GROWTH 4 DAYS Performed at [facility], [address], [place], [place] [address]    Report Status PENDING  Incomplete  CULTURE, BLOOD (ROUTINE X 2) w Reflex to ID Panel     Status: None (Preliminary result)   Collection Time: 06/29/20 10:34 PM   Specimen: BLOOD  Result Value Ref Range Status   Specimen Description BLOOD BLOOD RIGHT ARM  Final   Special Requests   Final    BOTTLES DRAWN AEROBIC AND ANAEROBIC Blood Culture results may not be optimal due to an inadequate volume of blood received in culture bottles   Culture   Final    NO GROWTH 4 DAYS Performed at [facility], [address] [place], [place] [address]    Report Status PENDING  Incomplete    Procedures and diagnostic studies:  DG Abd Portable 1V  Result Date: 07/02/2020 CLINICAL DATA:  Left-sided abdominal pain. EXAM: PORTABLE ABDOMEN - 1 VIEW COMPARISON:  CT scan June 30, 2020 FINDINGS: Biliary stents are identified in the right side of the abdomen. There is diffuse colonic distension remaining. No other acute abnormalities. IMPRESSION: 1. Two biliary stents seen in the right side of the abdomen. 2. Diffuse colonic distension, similar since June 30, 2020. Electronically Signed   By: Dorise Bullion III M.D   On: 07/02/2020 11:49   DG C-Arm 1-60 Min-No Report  Result Date: 07/01/2020 Fluoroscopy was utilized by the requesting physician.  No radiographic interpretation.    Medications:    amitriptyline  25 mg Oral QHS   bisacodyl  45 mg Oral BID   docusate sodium  100 mg Oral BID   famotidine  40 mg Oral QHS   fluticasone  2 spray  Each Nare BID   gabapentin  300 mg Oral BID   metoCLOPramide  10 mg Oral BID   mometasone-formoterol  2 puff Inhalation BID   nicotine  14 mg Transdermal Daily   oxybutynin  10 mg Oral Q1200   pantoprazole  40 mg Oral Daily    senna-docusate  1 tablet Oral Daily   sodium citrate-citric acid  15 mL Oral BID   sodium phosphate  1 enema Rectal Once   tamsulosin  0.4 mg Oral QHS   theophylline  400 mg Oral BID   Continuous Infusions:  lactated ringers 100 mL/hr at 07/03/20 0320   meropenem (MERREM) IV 1 g (07/03/20 0945)     LOS: 3 days   Geradine Girt  [facility]   How to contact the [facility] Attending or Consulting provider [place] or covering provider during after hours [place], [place], for this patient?  1. Check the care team in [facility] and look for a) attending/consulting [facility] provider listed and b) the [facility] team listed 2. Log into [email or web address] and use 's universal password to access. If you do not have the password, please contact the hospital operator. 3. Locate the [facility] provider you are looking for under [facility] and page to a number that you can be directly reached. 4. If you still have difficulty reaching the provider, please page the [facility] (Director on Call) for the Hospitalists listed on amion for assistance.  07/03/2020, 10:48 AM

## 2020-07-03 NOTE — Progress Notes (Signed)
Patient ID: Katherine Perez, female   DOB: [birth year], 50 y.o.   MRN: [id number]     [facility] Day(s): 3.   Post op day(s): 2 Days Post-Op.   Interval History: Patient seen and examined, no acute events or new complaints overnight. Patient reports continued feeling distended.  There is no significant pain.  There has been no alleviating or rating factor.  Patient did not have a significant bowel movement.  Abdominal x-ray yesterday showed significant large intestine dilation without small bowel dilation.  Denies nausea or vomiting.  Reports tolerating full liquids.  Vital signs in last 24 hours: [min-max] current  Temp:  [98 F (36.7 C)-98.9 F (37.2 C)] 98.9 F (37.2 C) (03/13 0926) Pulse Rate:  [90-106] 105 (03/13 0926) Resp:  [16-20] 20 (03/13 0926) BP: (115-120)/(69-76) 115/72 (03/13 0548) SpO2:  [97 %-99 %] 98 % (03/13 0926)     Height: '5\' 9"'$  (175.3 cm) Weight: 59 kg BMI (Calculated): 19.19   Physical Exam:  Constitutional: alert, cooperative and no distress  Respiratory: breathing non-labored at rest  Cardiovascular: regular rate and sinus rhythm  Gastrointestinal: soft, non-tender, and distended, tympanic.  Labs:  CBC Latest Ref Rng & Units 07/03/2020 07/02/2020 07/01/2020  WBC 4.0 - 10.5 K/uL 17.6(H) 24.8(H) 25.8(H)  Hemoglobin 12.0 - 15.0 g/dL 8.7(L) 9.4(L) 11.9(L)  Hematocrit 36.0 - 46.0 % 28.3(L) 29.3(L) 39.0  Platelets 150 - 400 K/uL 438(H) 443(H) 568(H)   CMP Latest Ref Rng & Units 07/03/2020 07/02/2020 07/01/2020  Glucose 70 - 99 mg/dL 84 90 116(H)  BUN 6 - 20 mg/dL 29(H) 35(H) 37(H)  Creatinine 0.44 - 1.00 mg/dL 1.83(H) 2.10(H) 1.92(H)  Sodium 135 - 145 mmol/L 133(L) 136 142  Potassium 3.5 - 5.1 mmol/L 3.1(L) 3.8 4.6  Chloride 98 - 111 mmol/L 109 111 114(H)  CO2 22 - 32 mmol/L 16(L) 14(L) 16(L)  Calcium 8.9 - 10.3 mg/dL 8.7(L) 8.5(L) 9.2  Total Protein 6.5 - 8.1 g/dL 5.8(L) 5.9(L) -  Total Bilirubin 0.3 - 1.2 mg/dL 0.8 0.8 -  Alkaline Phos 38 -  126 U/L 130(H) 144(H) -  AST 15 - 41 U/L 21 26 -  ALT 0 - 44 U/L 11 11 -    Imaging studies: I personally evaluated the images of the abdominal x-ray.  There is colonic distention without small bowel dilation.  No free air.   Assessment/Plan:  50 y.o. female with dilated 2 Day Post-Op s/p ERCP with stent placement.  Bile leak: Patient treated with ERCP and stent placement.  Doing well.  No sign of complication from the procedure.  White blood cell trending down.  Constipation: Patient with chronic constipation.  Her constipation is so severe that her gastroenterologist recommended total colectomy.    Did not have a significant bowel movement with aggressive oral laxative.  I will order a Fleet enema today.  Abdominal distention: Abdominal distention can be combination of increased intra-abdominal fluid from bile leak and her severe constipation.    X-ray yesterday is more concerning of colonic distention more than fluid collection.  I will try to treat the colonic dilation with clear enema today.  If there is no improvement I will order a CT scan tomorrow to see if there is any fluid collection that is causing a reactive ileus.  Appreciate hospitalist management of other medical comorbidities such as acute kidney injury, that is improved today.  Agreed to continue antibiotic therapy.  Appreciate GI service management and follow-up.  I encouraged the patient  to ambulate.  Arnold Long, MD

## 2020-07-04 ENCOUNTER — Inpatient Hospital Stay: Payer: BC Managed Care – PPO

## 2020-07-04 DIAGNOSIS — E876 Hypokalemia: Secondary | ICD-10-CM | POA: Diagnosis not present

## 2020-07-04 DIAGNOSIS — R140 Abdominal distension (gaseous): Secondary | ICD-10-CM

## 2020-07-04 LAB — BASIC METABOLIC PANEL
Anion gap: 9 (ref 5–15)
BUN: 23 mg/dL — ABNORMAL HIGH (ref 6–20)
CO2: 17 mmol/L — ABNORMAL LOW (ref 22–32)
Calcium: 9.9 mg/dL (ref 8.9–10.3)
Chloride: 110 mmol/L (ref 98–111)
Creatinine, Ser: 1.8 mg/dL — ABNORMAL HIGH (ref 0.44–1.00)
GFR, Estimated: 34 mL/min — ABNORMAL LOW (ref >60)
Glucose, Bld: 78 mg/dL (ref 70–99)
Potassium: 3.4 mmol/L — ABNORMAL LOW (ref 3.5–5.1)
Sodium: 136 mmol/L (ref 135–145)

## 2020-07-04 LAB — CULTURE, BLOOD (ROUTINE X 2)
Culture: NO GROWTH
Culture: NO GROWTH

## 2020-07-04 LAB — CBC
HCT: 29 % — ABNORMAL LOW (ref 36.0–46.0)
Hemoglobin: 9.4 g/dL — ABNORMAL LOW (ref 12.0–15.0)
MCH: 32.8 pg (ref 26.0–34.0)
MCHC: 32.4 g/dL (ref 30.0–36.0)
MCV: 101 fL — ABNORMAL HIGH (ref 80.0–100.0)
Platelets: 444 10*3/uL — ABNORMAL HIGH (ref 150–400)
RBC: 2.87 MIL/uL — ABNORMAL LOW (ref 3.87–5.11)
RDW: 14.9 % (ref 11.5–15.5)
WBC: 17.4 10*3/uL — ABNORMAL HIGH (ref 4.0–10.5)
nRBC: 0 % (ref 0.0–0.2)

## 2020-07-04 LAB — VITAMIN B12: Vitamin B-12: 1005 pg/mL — ABNORMAL HIGH (ref 180–914)

## 2020-07-04 MED ORDER — POTASSIUM CHLORIDE CRYS ER 20 MEQ PO TBCR
40.0000 meq | EXTENDED_RELEASE_TABLET | Freq: Once | ORAL | Status: AC
  Administered 2020-07-04: 40 meq via ORAL
  Filled 2020-07-04: qty 2

## 2020-07-04 MED ORDER — IOHEXOL 9 MG/ML PO SOLN
500.0000 mL | ORAL | Status: AC
  Administered 2020-07-04 (×2): 500 mL via ORAL

## 2020-07-04 MED ORDER — POTASSIUM CHLORIDE CRYS ER 20 MEQ PO TBCR: 40.0000 meq | EXTENDED_RELEASE_TABLET | Freq: Every day | ORAL | Status: DC

## 2020-07-04 MED ORDER — ONDANSETRON HCL 4 MG PO TABS
4.0000 mg | ORAL_TABLET | Freq: Four times a day (QID) | ORAL | Status: AC | PRN
  Administered 2020-07-04: 4 mg via ORAL
  Filled 2020-07-04: qty 1

## 2020-07-04 MED ORDER — ONDANSETRON HCL 4 MG/2ML IJ SOLN: 4.0000 mg | Freq: Four times a day (QID) | INTRAMUSCULAR | Status: DC

## 2020-07-04 MED ORDER — ONDANSETRON HCL 4 MG/2ML IJ SOLN
4.0000 mg | Freq: Four times a day (QID) | INTRAMUSCULAR | Status: AC | PRN
  Administered 2020-07-04 – 2020-07-05 (×2): 4 mg via INTRAVENOUS
  Filled 2020-07-04 (×2): qty 2

## 2020-07-04 NOTE — Progress Notes (Signed)
Progress Note    Katherine Perez  [id number] DOB: [birth year]  DOA: 06/29/2020 PCP: Gladstone Lighter, MD    Brief Narrative:     Medical records reviewed and are as summarized below:  Katherine Perez is an 50 y.o. female with medical history significant for recurrent UTI, dyspareunia, incomplete bladder emptying, anxiety, insomnia, asthma, seasonal allergies, neuropathy, presented to the emergency department for chief concerns of right upper quadrant abdominal pain.  Found to have a bile leak on NM scan.  S/p ERCP and stent.  Remains SEVERELY constipated.   Assessment/Plan:   Active Problems:   Leukocytosis   GERD without esophagitis   CKD (chronic kidney disease) stage 3, GFR 30-59 ml/min (HCC)   Acalculous cholecystitis   Bile leak   Other specified diseases of biliary tract   Disease of biliary tract, unspecified   Right upper quadrant abdominal pain due to biliary leak -HIDA scan showed: bile leak -general surgery/GI consult -change IV zosyn to IV merrem in case she could have a UTI that is contributing to her WBCs -s/p ERCP and stent placement -repeat CT scan 3/13: CBD stents with new mild intrahepatic biliary dilatation. Small fluid collection at gallbladder fossa 3.2 x 1.7 cm, may represent sequela of recent bile leak, without air to suggest abscess. Diffuse wall thickening of the duodenum and jejunum question related to preceding surgery and ERCP though duodenitis not excluded  ? UTI -change IV abx to merrem to finish course  Chronic constipation-suspect secondary to chronic pain management, resumed home metoclopramide 10 mg twice daily -encouraged ambulation -aggressive bowel regimen  Peripheral neuropathy-gabapentin resumed but will need lower dose due to renal failure  Asthma/COPD-inhalers resumed -Theophylline 400 mg p.o. twice daily resumed -Flonase 2 spray each nare twice daily, Dulera 2 puffs inhalation twice daily, albuterol nebulizers every 4  hours as needed for wheezing and shortness of breath  Urinary retention-Flomax 0.5 mg nightly resumed  Hyperkalemia now hypokalemia -replete to aide bowel function to 4  AKI on CKD IIIa -strict I/Os -PRN bladder scan -aggressive IVF -avoid nephrotoxic agents  alcohol use -monitor for withdrawal MCV> 100- will add vitamins and check levels    Family Communication/Anticipated D/C date and plan/Code Status   DVT prophylaxis: Lovenox Code Status: Full Code.  Disposition Plan: Status is: inpt  The patient will require care spanning > 2 midnights and should be moved to inpatient because: Inpatient level of care appropriate due to severity of illness  Dispo: The patient is from: Home              Anticipated d/c is to: Home              Patient currently is not medically stable to d/c.   Difficult to place patient No         Medical Consultants:    GS  GI   Subjective:  Says she is slowly feeling better  Objective:    Vitals:   07/03/20 2201 07/04/20 0444 07/04/20 0818 07/04/20 1223  BP: 128/82 137/79 126/83 126/86  Pulse: (!) 107 (!) 112 99 (!) 103  Resp: '14 17 16 16  '$ Temp: 98.4 F (36.9 C) 98.2 F (36.8 C) 97.8 F (36.6 C) 98.1 F (36.7 C)  TempSrc:   Oral Oral  SpO2: 99% 98% 100% 100%  Weight:      Height:        Intake/Output Summary (Last 24 hours) at 07/04/2020 1440 Last data filed at 07/04/2020 0546 Gross per 24 hour  Intake 4948.94 ml  Output 700 ml  Net 4248.94 ml   Filed Weights   06/29/20 1550  Weight: 59 kg    Exam:   General: Appearance:    Thin female in no acute distress   +BS, soft, not as tender  Lungs:      respirations unlabored  Heart:    Tachycardic. Normal rhythm. No murmurs, rubs, or gallops.   MS:   All extremities are intact.   Neurologic:   Awake, alert, oriented x 3. No apparent focal neurological           defect.     Data Reviewed:   I have personally reviewed following labs and imaging  studies:  Labs: Labs show the following:   Basic Metabolic Panel: Recent Labs  Lab 06/30/20 0447 07/01/20 0418 07/02/20 0605 07/02/20 1200 07/03/20 0529 07/04/20 0501  NA 137 142 136  --  133* 136  K 5.5* 4.6 3.8  --  3.1* 3.4*  CL 113* 114* 111  --  109 110  CO2 14* 16* 14*  --  16* 17*  GLUCOSE 77 116* 90  --  84 78  BUN 37* 37* 35*  --  29* 23*  CREATININE 1.50* 1.92* 2.10*  --  1.83* 1.80*  CALCIUM 8.8* 9.2 8.5*  --  8.7* 9.9  MG  --   --   --  2.3  --   --   PHOS  --   --   --  3.7  --   --    GFR Estimated Creatinine Clearance: 35.6 mL/min (A) (by C-G formula based on SCr of 1.8 mg/dL (H)). Liver Function Tests: Recent Labs  Lab 06/29/20 1602 06/30/20 0447 07/02/20 0605 07/03/20 0529  AST 37 '27 26 21  '$ ALT '9 8 11 11  '$ ALKPHOS 82 87 144* 130*  BILITOT 0.6 0.7 0.8 0.8  PROT 7.1 6.2* 5.9* 5.8*  ALBUMIN 3.5 2.9* 2.6* 2.5*   Recent Labs  Lab 06/29/20 1602  LIPASE 38   No results for input(s): AMMONIA in the last 168 hours. Coagulation profile No results for input(s): INR, PROTIME in the last 168 hours.  CBC: Recent Labs  Lab 06/30/20 0447 07/01/20 0418 07/02/20 0605 07/03/20 0529 07/04/20 0501  WBC 23.9* 25.8* 24.8* 17.6* 17.4*  HGB 12.9 11.9* 9.4* 8.7* 9.4*  HCT 41.1 39.0 29.3* 28.3* 29.0*  MCV 102.8* 105.4* 102.4* 104.0* 101.0*  PLT 589* 568* 443* 438* 444*   Cardiac Enzymes: No results for input(s): CKTOTAL, CKMB, CKMBINDEX, TROPONINI in the last 168 hours. BNP (last 3 results) No results for input(s): PROBNP in the last 8760 hours. CBG: Recent Labs  Lab 06/27/20 1349 06/27/20 1620  GLUCAP 74 123*   D-Dimer: No results for input(s): DDIMER in the last 72 hours. Hgb A1c: No results for input(s): HGBA1C in the last 72 hours. Lipid Profile: No results for input(s): CHOL, HDL, LDLCALC, TRIG, CHOLHDL, LDLDIRECT in the last 72 hours. Thyroid function studies: No results for input(s): TSH, T4TOTAL, T3FREE, THYROIDAB in the last 72  hours.  Invalid input(s): FREET3 Anemia work up: Recent Labs    07/03/20 0529 07/04/20 0501  VITAMINB12  --  1,005*  FERRITIN 356*  --   TIBC 225*  --   IRON 21*  --    Sepsis Labs: Recent Labs  Lab 07/01/20 0418 07/02/20 0605 07/03/20 0529 07/04/20 0501  WBC 25.8* 24.8* 17.6* 17.4*    Microbiology Recent Results (from the past 240 hour(s))  Urine Culture  Status: Abnormal   Collection Time: 06/29/20  4:02 PM   Specimen: Urine, Random  Result Value Ref Range Status   Specimen Description   Final    URINE, RANDOM Performed at [facility], [address], [place], [place] [address]    Special Requests   Final    NONE Performed at [facility], [address] [place], [place] [address]    Culture (A)  Final    >=100,000 COLONIES/mL KLEBSIELLA PNEUMONIAE Confirmed Extended Spectrum Beta-Lactamase Producer (ESBL).  In bloodstream infections from ESBL organisms, carbapenems are preferred over piperacillin/tazobactam. They are shown to have a lower risk of mortality.    Report Status 07/02/2020 FINAL  Final   Organism ID, Bacteria KLEBSIELLA PNEUMONIAE (A)  Final      Susceptibility   Klebsiella pneumoniae - MIC*    AMPICILLIN >=32 RESISTANT Resistant     CEFAZOLIN >=64 RESISTANT Resistant     CEFEPIME 2 SENSITIVE Sensitive     CEFTRIAXONE >=64 RESISTANT Resistant     CIPROFLOXACIN >=4 RESISTANT Resistant     GENTAMICIN >=16 RESISTANT Resistant     IMIPENEM <=0.25 SENSITIVE Sensitive     NITROFURANTOIN 64 INTERMEDIATE Intermediate     TRIMETH/SULFA >=320 RESISTANT Resistant     AMPICILLIN/SULBACTAM >=32 RESISTANT Resistant     PIP/TAZO 8 SENSITIVE Sensitive     * >=100,000 COLONIES/mL KLEBSIELLA PNEUMONIAE  Resp Panel by RT-PCR (Flu A&B, Covid) Nasopharyngeal Swab     Status: None   Collection Time: 06/29/20  7:11 PM   Specimen: Nasopharyngeal Swab; Nasopharyngeal(NP) swabs in vial transport medium  Result Value Ref Range Status   SARS  Coronavirus 2 by RT PCR NEGATIVE NEGATIVE Final    Comment: (NOTE) SARS-CoV-2 target nucleic acids are NOT DETECTED.  The SARS-CoV-2 RNA is generally detectable in upper respiratory specimens during the acute phase of infection. The lowest concentration of SARS-CoV-2 viral copies this assay can detect is 138 copies/mL. A negative result does not preclude SARS-Cov-2 infection and should not be used as the sole basis for treatment or other patient management decisions. A negative result may occur with  improper specimen collection/handling, submission of specimen other than nasopharyngeal swab, presence of viral mutation(s) within the areas targeted by this assay, and inadequate number of viral copies(<138 copies/mL). A negative result must be combined with clinical observations, patient history, and epidemiological information. The expected result is Negative.  Fact Sheet for Patients:  EntrepreneurPulse.com.au  Fact Sheet for Healthcare Providers:  IncredibleEmployment.be  This test is no t yet approved or cleared by the [place] FDA and  has been authorized for detection and/or diagnosis of SARS-CoV-2 by FDA under an Emergency Use Authorization (EUA). This EUA will remain  in effect (meaning this test can be used) for the duration of the COVID-19 declaration under Section 564(b)(1) of the Act, 21 U.S.C.section 360bbb-3(b)(1), unless the authorization is terminated  or revoked sooner.       Influenza A by PCR NEGATIVE NEGATIVE Final   Influenza B by PCR NEGATIVE NEGATIVE Final    Comment: (NOTE) The Xpert Xpress SARS-CoV-2/FLU/RSV plus assay is intended as an aid in the diagnosis of influenza from Nasopharyngeal swab specimens and should not be used as a sole basis for treatment. Nasal washings and aspirates are unacceptable for Xpert Xpress SARS-CoV-2/FLU/RSV testing.  Fact Sheet for  Patients: EntrepreneurPulse.com.au  Fact Sheet for Healthcare Providers: IncredibleEmployment.be  This test is not yet approved or cleared by the [place] and has been authorized  for detection and/or diagnosis of SARS-CoV-2 by FDA under an Emergency Use Authorization (EUA). This EUA will remain in effect (meaning this test can be used) for the duration of the COVID-19 declaration under Section 564(b)(1) of the Act, 21 U.S.C. section 360bbb-3(b)(1), unless the authorization is terminated or revoked.  Performed at [facility], [address] [place], [place] [address]   CULTURE, BLOOD (ROUTINE X 2) w Reflex to ID Panel     Status: None   Collection Time: 06/29/20 10:18 PM   Specimen: BLOOD  Result Value Ref Range Status   Specimen Description BLOOD BLOOD RIGHT ARM  Final   Special Requests   Final    BOTTLES DRAWN AEROBIC AND ANAEROBIC Blood Culture results may not be optimal due to an inadequate volume of blood received in culture bottles   Culture   Final    NO GROWTH 5 DAYS Performed at [facility], [address] [place], [place] [address]    Report Status 07/04/2020 FINAL  Final  CULTURE, BLOOD (ROUTINE X 2) w Reflex to ID Panel     Status: None   Collection Time: 06/29/20 10:34 PM   Specimen: BLOOD  Result Value Ref Range Status   Specimen Description BLOOD BLOOD RIGHT ARM  Final   Special Requests   Final    BOTTLES DRAWN AEROBIC AND ANAEROBIC Blood Culture results may not be optimal due to an inadequate volume of blood received in culture bottles   Culture   Final    NO GROWTH 5 DAYS Performed at [facility], [address] [place], [place] [address]    Report Status 07/04/2020 FINAL  Final    Procedures and diagnostic studies:  CT ABDOMEN PELVIS WO CONTRAST  Result Date: 07/04/2020 CLINICAL DATA:  Bile leak post cholecystectomy, treated by ERCP and stenting, follow-up, continued upper  quadrant abdominal pain EXAM: CT ABDOMEN AND PELVIS WITHOUT CONTRAST TECHNIQUE: Multidetector CT imaging of the abdomen and pelvis was performed following the standard protocol without IV contrast. Sagittal and coronal MPR images reconstructed from axial data set. Dilute oral contrast was administered. COMPARISON:  06/30/2020 FINDINGS: Lower chest: Small RIGHT pleural effusion and RIGHT basilar atelectasis Hepatobiliary: Small fluid collection at gallbladder fossa post cholecystectomy 3.2 x 1.7 cm image 34, without air. CBD stents extending to duodenum. Mild intrahepatic biliary dilatation, new. No additional hepatic abnormalities. Pancreas: Unremarkable Spleen: Normal appearance Adrenals/Urinary Tract: Small nonobstructing calculus at inferior pole LEFT kidney. Adrenal glands, kidneys, and bladder otherwise normal appearance. Stomach/Bowel: Normal appendix in RIGHT mid abdomen. Diffuse colonic distention. Prominent stool in cecum and proximal ascending colon. Diffuse wall thickening of the duodenum greatest at second portion may be related to preceding surgery and ERCP though duodenitis not excluded. Mild stranding of adjacent fat planes without extraluminal gas. Bowel wall thickening of additional jejunal loops which may be related to surgery. Remaining bowel loops and stomach unremarkable. Vascular/Lymphatic: Atherosclerotic calcifications aorta and iliac arteries without aneurysm. No adenopathy. Reproductive: Question atrophic uterus, poorly visualized. Other: No free air or free fluid. Small RIGHT inguinal hernia containing fat. Extensive subcutaneous edema especially at the flanks. Musculoskeletal: Disc space narrowing L4-L5. IMPRESSION: CBD stents with new mild intrahepatic biliary dilatation. Small fluid collection at gallbladder fossa 3.2 x 1.7 cm, may represent sequela of recent bile leak, without air to suggest abscess. Diffuse wall thickening of the duodenum and jejunum question related to preceding  surgery and ERCP though duodenitis not excluded. Small RIGHT pleural effusion and RIGHT basilar atelectasis. RIGHT  inguinal hernia containing fat. Nonobstructive LEFT renal calculus. Aortic Atherosclerosis (ICD10-I70.0). Electronically Signed   By: Lavonia Dana M.D.   On: 07/04/2020 10:42    Medications:   . amitriptyline  25 mg Oral QHS  . bisacodyl  45 mg Oral BID  . docusate sodium  100 mg Oral BID  . enoxaparin (LOVENOX) injection  40 mg Subcutaneous Q24H  . famotidine  40 mg Oral QHS  . fluticasone  2 spray Each Nare BID  . folic acid  1 mg Oral Daily  . gabapentin  300 mg Oral BID  . metoCLOPramide  10 mg Oral BID  . mometasone-formoterol  2 puff Inhalation BID  . nicotine  14 mg Transdermal Daily  . oxybutynin  10 mg Oral Q1200  . pantoprazole  40 mg Oral Daily  . [START ON 07/05/2020] potassium chloride  40 mEq Oral Daily  . senna-docusate  1 tablet Oral Daily  . sodium citrate-citric acid  15 mL Oral BID  . sodium phosphate  1 enema Rectal Once  . tamsulosin  0.4 mg Oral QHS  . theophylline  400 mg Oral BID  . thiamine injection  100 mg Intravenous Daily   Continuous Infusions: . lactated ringers 100 mL/hr at 07/04/20 0801  . meropenem (MERREM) IV 1 g (07/04/20 1114)     LOS: 4 days   Geradine Girt  [facility]   How to contact the [facility] Attending or Consulting provider [place] or covering provider during after hours [place], for this patient?  1. Check the care team in [facility] and look for a) attending/consulting [facility] provider listed and b) the [facility] team listed 2. Log into [email or web address] and use [place]'s universal password to access. If you do not have the password, please contact the hospital operator. 3. Locate the [facility] provider you are looking for under [facility] and page to a number that you can be directly reached. 4. If you still have difficulty reaching the provider, please page the [facility] (Director on Call) for the Hospitalists listed on amion for  assistance.  07/04/2020, 2:40 PM

## 2020-07-04 NOTE — Progress Notes (Signed)
Patient ID: Katherine Perez, female   DOB: 07-21-1970, 50 y.o.   MRN: [id number]     [place] Hospital Day(s): 4.   Post op day(s): 3 Days Post-Op.   Interval History: Patient seen and examined, no acute events or new complaints overnight. Patient reports feeling distended but denies any worsening nausea.  She is tolerating diet.  She report that she had couple of bowel movements are loose and watery and clear.  She denies any formed stool.  Vital signs in last 24 hours: [min-max] current  Temp:  [97.8 F (36.6 C)-98.5 F (36.9 C)] 98.1 F (36.7 C) (03/14 1223) Pulse Rate:  [99-119] 103 (03/14 1223) Resp:  [14-19] 16 (03/14 1223) BP: (126-137)/(79-86) 126/86 (03/14 1223) SpO2:  [96 %-100 %] 100 % (03/14 1223)     Height: '5\' 9"'$  (175.3 cm) Weight: 59 kg BMI (Calculated): 19.19   Physical Exam:  Constitutional: alert, cooperative and no distress  Respiratory: breathing non-labored at rest  Cardiovascular: regular rate and sinus rhythm  Gastrointestinal: soft, non-tender, and distended  Labs:  CBC Latest Ref Rng & Units 07/04/2020 07/03/2020 07/02/2020  WBC 4.0 - 10.5 K/uL 17.4(H) 17.6(H) 24.8(H)  Hemoglobin 12.0 - 15.0 g/dL 9.4(L) 8.7(L) 9.4(L)  Hematocrit 36.0 - 46.0 % 29.0(L) 28.3(L) 29.3(L)  Platelets 150 - 400 K/uL 444(H) 438(H) 443(H)   CMP Latest Ref Rng & Units 07/04/2020 07/03/2020 07/02/2020  Glucose 70 - 99 mg/dL 78 84 90  BUN 6 - 20 mg/dL 23(H) 29(H) 35(H)  Creatinine 0.44 - 1.00 mg/dL 1.80(H) 1.83(H) 2.10(H)  Sodium 135 - 145 mmol/L 136 133(L) 136  Potassium 3.5 - 5.1 mmol/L 3.4(L) 3.1(L) 3.8  Chloride 98 - 111 mmol/L 110 109 111  CO2 22 - 32 mmol/L 17(L) 16(L) 14(L)  Calcium 8.9 - 10.3 mg/dL 9.9 8.7(L) 8.5(L)  Total Protein 6.5 - 8.1 g/dL - 5.8(L) 5.9(L)  Total Bilirubin 0.3 - 1.2 mg/dL - 0.8 0.8  Alkaline Phos 38 - 126 U/L - 130(H) 144(H)  AST 15 - 41 U/L - 21 26  ALT 0 - 44 U/L - 11 11    Imaging studies: CT abdominal pelvis shows significant large  intestinal dilation without small bowel dilation.  There is no significant fluid collection from the bile leak.  No need of drainage.  Assessment/Plan:  50 y.o.femalewith bile leak3 Day Post-Ops/p ERCP with stent placement.  Bile leak: Patient treated with ERCP and stent placement. Doing well. No sign of complication from the procedure.  White blood cell trending down, now at her baseline.  CT did not shows any fluid collection from the bile leak, no biloma.  No further imaging or work-up for her bile leak.  Constipation: Patient with chronic constipation. Her constipation is so severe that her gastroenterologist recommended total colectomy.     She has had 2 enemas during the admission that has decreased to solid stool in the large intestine as per CT scan but she continue with significant dilation with gas.  Recommend to continue follow-up of electrolytes to make sure that there is no disturbance that needs to be addressed.  No significant fluid collection as the cause of this problem. I will continue aggressive bowel regimen.   Abdominal distention: The abdominal distention with the new CT scan is confirmed to be from colonic dilation and not from fluid collection. Will continue aggressive bowel regiment.   Appreciate hospitalist management of other medical comorbidities such as acute kidney injury, that is improved today. Agreed to continue  antibiotic therapy. Appreciate GI service management and follow-up.  I encouraged the patient to ambulate.  Arnold Long, MD

## 2020-07-04 NOTE — Progress Notes (Signed)
   07/04/20 0444  Assess: MEWS Score  Temp 98.2 F (36.8 C)  BP 137/79  Pulse Rate (!) 112  Resp 17  Level of Consciousness Alert  SpO2 98 %  O2 Device Room Air  Patient Activity (if Appropriate) In bed  Assess: MEWS Score  MEWS Temp 0  MEWS Systolic 0  MEWS Pulse 2  MEWS RR 0  MEWS LOC 0  MEWS Score 2  MEWS Score Color Yellow  Assess: if the MEWS score is Yellow or Red  Were vital signs taken at a resting state? Yes  Focused Assessment No change from prior assessment  Early Detection of Sepsis Score *See Row Information* Low  MEWS guidelines implemented *See Row Information* No, other (Comment) (Patient has been tachy off and on for days)

## 2020-07-04 NOTE — Progress Notes (Signed)
Katherine Antigua, MD [address] [place], [place], [place], [address] [address], [place], [place], [place], [address] Phone: [phone]  Fax: [phone]   Subjective:  Patient reports 3-4 bowel movements yesterday.  Reports improvement in abdominal distention after the bowel movements.  Has had flatus as well.  No nausea or vomiting.  Objective: Exam: Vital signs in last 24 hours: Vitals:   07/03/20 2201 07/04/20 0444 07/04/20 0818 07/04/20 1223  BP: 128/82 137/79 126/83 126/86  Pulse: (!) 107 (!) 112 99 (!) 103  Resp: '14 17 16 16  '$ Temp: 98.4 F (36.9 C) 98.2 F (36.8 C) 97.8 F (36.6 C) 98.1 F (36.7 C)  TempSrc:   Oral Oral  SpO2: 99% 98% 100% 100%  Weight:      Height:       Weight change:   Intake/Output Summary (Last 24 hours) at 07/04/2020 1348 Last data filed at 07/04/2020 0546 Gross per 24 hour  Intake 4948.94 ml  Output 700 ml  Net 4248.94 ml    General: No acute distress, AAO x3 Abd: Soft, NT/ND, No HSM Skin: Warm, no rashes Neck: Supple, Trachea midline   Lab Results: Lab Results  Component Value Date   WBC 17.4 (H) 07/04/2020   HGB 9.4 (L) 07/04/2020   HCT 29.0 (L) 07/04/2020   MCV 101.0 (H) 07/04/2020   PLT 444 (H) 07/04/2020   Micro Results: Recent Results (from the past 240 hour(s))  Urine Culture     Status: Abnormal   Collection Time: 06/29/20  4:02 PM   Specimen: Urine, Random  Result Value Ref Range Status   Specimen Description   Final    URINE, RANDOM Performed at [facility], [address] [place], [place] [address]    Special Requests   Final    NONE Performed at [facility], [address] [place], [place] [address]    Culture (A)  Final    >=100,000 COLONIES/mL KLEBSIELLA PNEUMONIAE Confirmed Extended Spectrum Beta-Lactamase Producer (ESBL).  In bloodstream infections from ESBL organisms, carbapenems are preferred over piperacillin/tazobactam. They are shown to have a lower risk of  mortality.    Report Status 07/02/2020 FINAL  Final   Organism ID, Bacteria KLEBSIELLA PNEUMONIAE (A)  Final      Susceptibility   Klebsiella pneumoniae - MIC*    AMPICILLIN >=32 RESISTANT Resistant     CEFAZOLIN >=64 RESISTANT Resistant     CEFEPIME 2 SENSITIVE Sensitive     CEFTRIAXONE >=64 RESISTANT Resistant     CIPROFLOXACIN >=4 RESISTANT Resistant     GENTAMICIN >=16 RESISTANT Resistant     IMIPENEM <=0.25 SENSITIVE Sensitive     NITROFURANTOIN 64 INTERMEDIATE Intermediate     TRIMETH/SULFA >=320 RESISTANT Resistant     AMPICILLIN/SULBACTAM >=32 RESISTANT Resistant     PIP/TAZO 8 SENSITIVE Sensitive     * >=100,000 COLONIES/mL KLEBSIELLA PNEUMONIAE  Resp Panel by RT-PCR (Flu A&B, Covid) Nasopharyngeal Swab     Status: None   Collection Time: 06/29/20  7:11 PM   Specimen: Nasopharyngeal Swab; Nasopharyngeal(NP) swabs in vial transport medium  Result Value Ref Range Status   SARS Coronavirus 2 by RT PCR NEGATIVE NEGATIVE Final    Comment: (NOTE) SARS-CoV-2 target nucleic acids are NOT DETECTED.  The SARS-CoV-2 RNA is generally detectable in upper respiratory specimens during the acute phase of infection. The lowest concentration of SARS-CoV-2 viral copies this assay can detect is 138 copies/mL. A negative result does not preclude SARS-Cov-2 infection and should  not be used as the sole basis for treatment or other patient management decisions. A negative result may occur with  improper specimen collection/handling, submission of specimen other than nasopharyngeal swab, presence of viral mutation(s) within the areas targeted by this assay, and inadequate number of viral copies(<138 copies/mL). A negative result must be combined with clinical observations, patient history, and epidemiological information. The expected result is Negative.  Fact Sheet for Patients:  EntrepreneurPulse.com.au  Fact Sheet for Healthcare Providers:   IncredibleEmployment.be  This test is no t yet approved or cleared by the [place] FDA and  has been authorized for detection and/or diagnosis of SARS-CoV-2 by FDA under an Emergency Use Authorization (EUA). This EUA will remain  in effect (meaning this test can be used) for the duration of the COVID-19 declaration under Section 564(b)(1) of the Act, 21 U.S.C.section 360bbb-3(b)(1), unless the authorization is terminated  or revoked sooner.       Influenza A by PCR NEGATIVE NEGATIVE Final   Influenza B by PCR NEGATIVE NEGATIVE Final    Comment: (NOTE) The Xpert Xpress SARS-CoV-2/FLU/RSV plus assay is intended as an aid in the diagnosis of influenza from Nasopharyngeal swab specimens and should not be used as a sole basis for treatment. Nasal washings and aspirates are unacceptable for Xpert Xpress SARS-CoV-2/FLU/RSV testing.  Fact Sheet for Patients: EntrepreneurPulse.com.au  Fact Sheet for Healthcare Providers: IncredibleEmployment.be  This test is not yet approved or cleared by the [place] FDA and has been authorized for detection and/or diagnosis of SARS-CoV-2 by FDA under an Emergency Use Authorization (EUA). This EUA will remain in effect (meaning this test can be used) for the duration of the COVID-19 declaration under Section 564(b)(1) of the Act, 21 U.S.C. section 360bbb-3(b)(1), unless the authorization is terminated or revoked.  Performed at [facility], [address] [place], [place] [address]   CULTURE, BLOOD (ROUTINE X 2) w Reflex to ID Panel     Status: None   Collection Time: 06/29/20 10:18 PM   Specimen: BLOOD  Result Value Ref Range Status   Specimen Description BLOOD BLOOD RIGHT ARM  Final   Special Requests   Final    BOTTLES DRAWN AEROBIC AND ANAEROBIC Blood Culture results may not be optimal due to an inadequate volume of blood received in culture bottles   Culture   Final     NO GROWTH 5 DAYS Performed at [facility], [address] [place], [place] [address]    Report Status 07/04/2020 FINAL  Final  CULTURE, BLOOD (ROUTINE X 2) w Reflex to ID Panel     Status: None   Collection Time: 06/29/20 10:34 PM   Specimen: BLOOD  Result Value Ref Range Status   Specimen Description BLOOD BLOOD RIGHT ARM  Final   Special Requests   Final    BOTTLES DRAWN AEROBIC AND ANAEROBIC Blood Culture results may not be optimal due to an inadequate volume of blood received in culture bottles   Culture   Final    NO GROWTH 5 DAYS Performed at [facility], [address] [place],  [address]    Report Status 07/04/2020 FINAL  Final   Studies/Results: CT ABDOMEN PELVIS WO CONTRAST  Result Date: 07/04/2020 CLINICAL DATA:  Bile leak post cholecystectomy, treated by ERCP and stenting, follow-up, continued upper quadrant abdominal pain EXAM: CT ABDOMEN AND PELVIS WITHOUT CONTRAST TECHNIQUE: Multidetector CT imaging of the abdomen and pelvis was performed following the standard protocol without IV contrast. Sagittal and  coronal MPR images reconstructed from axial data set. Dilute oral contrast was administered. COMPARISON:  06/30/2020 FINDINGS: Lower chest: Small RIGHT pleural effusion and RIGHT basilar atelectasis Hepatobiliary: Small fluid collection at gallbladder fossa post cholecystectomy 3.2 x 1.7 cm image 34, without air. CBD stents extending to duodenum. Mild intrahepatic biliary dilatation, new. No additional hepatic abnormalities. Pancreas: Unremarkable Spleen: Normal appearance Adrenals/Urinary Tract: Small nonobstructing calculus at inferior pole LEFT kidney. Adrenal glands, kidneys, and bladder otherwise normal appearance. Stomach/Bowel: Normal appendix in RIGHT mid abdomen. Diffuse colonic distention. Prominent stool in cecum and proximal ascending colon. Diffuse wall thickening of the duodenum greatest at second portion may be related to preceding  surgery and ERCP though duodenitis not excluded. Mild stranding of adjacent fat planes without extraluminal gas. Bowel wall thickening of additional jejunal loops which may be related to surgery. Remaining bowel loops and stomach unremarkable. Vascular/Lymphatic: Atherosclerotic calcifications aorta and iliac arteries without aneurysm. No adenopathy. Reproductive: Question atrophic uterus, poorly visualized. Other: No free air or free fluid. Small RIGHT inguinal hernia containing fat. Extensive subcutaneous edema especially at the flanks. Musculoskeletal: Disc space narrowing L4-L5. IMPRESSION: CBD stents with new mild intrahepatic biliary dilatation. Small fluid collection at gallbladder fossa 3.2 x 1.7 cm, may represent sequela of recent bile leak, without air to suggest abscess. Diffuse wall thickening of the duodenum and jejunum question related to preceding surgery and ERCP though duodenitis not excluded. Small RIGHT pleural effusion and RIGHT basilar atelectasis. RIGHT inguinal hernia containing fat. Nonobstructive LEFT renal calculus. Aortic Atherosclerosis (ICD10-I70.0). Electronically Signed   By: Lavonia Dana M.D.   On: 07/04/2020 10:42   Medications:  Scheduled Meds: . amitriptyline  25 mg Oral QHS  . bisacodyl  45 mg Oral BID  . docusate sodium  100 mg Oral BID  . enoxaparin (LOVENOX) injection  40 mg Subcutaneous Q24H  . famotidine  40 mg Oral QHS  . fluticasone  2 spray Each Nare BID  . folic acid  1 mg Oral Daily  . gabapentin  300 mg Oral BID  . metoCLOPramide  10 mg Oral BID  . mometasone-formoterol  2 puff Inhalation BID  . nicotine  14 mg Transdermal Daily  . oxybutynin  10 mg Oral Q1200  . pantoprazole  40 mg Oral Daily  . senna-docusate  1 tablet Oral Daily  . sodium citrate-citric acid  15 mL Oral BID  . sodium phosphate  1 enema Rectal Once  . tamsulosin  0.4 mg Oral QHS  . theophylline  400 mg Oral BID  . thiamine injection  100 mg Intravenous Daily   Continuous  Infusions: . lactated ringers 100 mL/hr at 07/04/20 0801  . meropenem (MERREM) IV 1 g (07/04/20 1114)   PRN Meds:.albuterol, cetaphil, diphenhydrAMINE, HYDROcodone-acetaminophen, iohexol, morphine injection   Assessment: Active Problems:   Leukocytosis   GERD without esophagitis   CKD (chronic kidney disease) stage 3, GFR 30-59 ml/min (HCC)   Acalculous cholecystitis   Bile leak   Other specified diseases of biliary tract   Disease of biliary tract, unspecified    Plan: As per patient abdominal distention has improved after bowel movements yesterday However, abdomen is still somewhat distended, although not tense  Continue to monitor bowel movements and if abdominal distention worsens or patient does not have any bowel movements today, consider MiraLAX or GoLYTELY for cleanout as recommended in Dr. Georgeann Oppenheim note yesterday.  Not clinically obstructed at this time  Continue to monitor electrolytes and replace as necessary.  Potassium has improved from  3.1 yesterday to 3.4 today, but remains low.  Would recommend replacement for this as well     LOS: 4 days   Katherine Antigua, MD 07/04/2020, 1:48 PM

## 2020-07-05 DIAGNOSIS — K839 Disease of biliary tract, unspecified: Secondary | ICD-10-CM | POA: Diagnosis not present

## 2020-07-05 DIAGNOSIS — R140 Abdominal distension (gaseous): Secondary | ICD-10-CM | POA: Diagnosis not present

## 2020-07-05 LAB — CBC WITH DIFFERENTIAL/PLATELET
Abs Immature Granulocytes: 0.16 10*3/uL — ABNORMAL HIGH (ref 0.00–0.07)
Basophils Absolute: 0.1 10*3/uL (ref 0.0–0.1)
Basophils Relative: 1 %
Eosinophils Absolute: 1.6 10*3/uL — ABNORMAL HIGH (ref 0.0–0.5)
Eosinophils Relative: 12 %
HCT: 26.7 % — ABNORMAL LOW (ref 36.0–46.0)
Hemoglobin: 8.3 g/dL — ABNORMAL LOW (ref 12.0–15.0)
Immature Granulocytes: 1 %
Lymphocytes Relative: 21 %
Lymphs Abs: 3 10*3/uL (ref 0.7–4.0)
MCH: 31.6 pg (ref 26.0–34.0)
MCHC: 31.1 g/dL (ref 30.0–36.0)
MCV: 101.5 fL — ABNORMAL HIGH (ref 80.0–100.0)
Monocytes Absolute: 1 10*3/uL (ref 0.1–1.0)
Monocytes Relative: 7 %
Neutro Abs: 8.1 10*3/uL — ABNORMAL HIGH (ref 1.7–7.7)
Neutrophils Relative %: 58 %
Platelets: 408 10*3/uL — ABNORMAL HIGH (ref 150–400)
RBC: 2.63 MIL/uL — ABNORMAL LOW (ref 3.87–5.11)
RDW: 14.6 % (ref 11.5–15.5)
WBC: 14 10*3/uL — ABNORMAL HIGH (ref 4.0–10.5)
nRBC: 0 % (ref 0.0–0.2)

## 2020-07-05 LAB — COMPREHENSIVE METABOLIC PANEL
ALT: 9 U/L (ref 0–44)
AST: 14 U/L — ABNORMAL LOW (ref 15–41)
Albumin: 2.1 g/dL — ABNORMAL LOW (ref 3.5–5.0)
Alkaline Phosphatase: 117 U/L (ref 38–126)
Anion gap: 8 (ref 5–15)
BUN: 17 mg/dL (ref 6–20)
CO2: 16 mmol/L — ABNORMAL LOW (ref 22–32)
Calcium: 9.4 mg/dL (ref 8.9–10.3)
Chloride: 108 mmol/L (ref 98–111)
Creatinine, Ser: 1.68 mg/dL — ABNORMAL HIGH (ref 0.44–1.00)
GFR, Estimated: 37 mL/min — ABNORMAL LOW (ref >60)
Glucose, Bld: 75 mg/dL (ref 70–99)
Potassium: 3.1 mmol/L — ABNORMAL LOW (ref 3.5–5.1)
Sodium: 132 mmol/L — ABNORMAL LOW (ref 135–145)
Total Bilirubin: 0.7 mg/dL (ref 0.3–1.2)
Total Protein: 5.1 g/dL — ABNORMAL LOW (ref 6.5–8.1)

## 2020-07-05 MED ORDER — PROMETHAZINE HCL 25 MG PO TABS
12.5000 mg | ORAL_TABLET | Freq: Four times a day (QID) | ORAL | Status: DC | PRN
  Administered 2020-07-05 – 2020-07-06 (×5): 12.5 mg via ORAL
  Filled 2020-07-05 (×5): qty 1

## 2020-07-05 MED ORDER — POTASSIUM CHLORIDE CRYS ER 20 MEQ PO TBCR
40.0000 meq | EXTENDED_RELEASE_TABLET | Freq: Two times a day (BID) | ORAL | Status: DC
  Administered 2020-07-05 – 2020-07-06 (×3): 40 meq via ORAL
  Filled 2020-07-05 (×4): qty 2

## 2020-07-05 MED ORDER — POTASSIUM CHLORIDE CRYS ER 20 MEQ PO TBCR
40.0000 meq | EXTENDED_RELEASE_TABLET | Freq: Once | ORAL | Status: AC
  Administered 2020-07-05: 40 meq via ORAL
  Filled 2020-07-05: qty 2

## 2020-07-05 MED ORDER — PROCHLORPERAZINE EDISYLATE 10 MG/2ML IJ SOLN
10.0000 mg | Freq: Four times a day (QID) | INTRAMUSCULAR | Status: DC | PRN
  Administered 2020-07-05: 10 mg via INTRAVENOUS
  Filled 2020-07-05: qty 2

## 2020-07-05 MED ORDER — MAGNESIUM CITRATE PO SOLN
0.5000 | Freq: Once | ORAL | Status: AC
  Administered 2020-07-05: 0.5 via ORAL
  Filled 2020-07-05: qty 296

## 2020-07-05 NOTE — Progress Notes (Signed)
Patient ID: Katherine Perez, female   DOB: 1970-10-03, 50 y.o.   MRN: [id number]     [facility] Day(s): 5.   Post op day(s): 4 Days Post-Op.   Interval History: Patient seen and examined, no acute events or new complaints overnight. Patient reports pain a little better after having bowel movement with admission citrate.  Vital signs in last 24 hours: [min-max] current  Temp:  [97.5 F (36.4 C)-99.1 F (37.3 C)] 99.1 F (37.3 C) (03/15 1323) Pulse Rate:  [95-109] 103 (03/15 1323) Resp:  [16-18] 18 (03/15 1323) BP: (126-156)/(78-87) 126/80 (03/15 1323) SpO2:  [99 %-100 %] 100 % (03/15 1323)     Height: '5\' 9"'$  (175.3 cm) Weight: 59 kg BMI (Calculated): 19.19   Physical Exam:  Constitutional: alert, cooperative and no distress  Respiratory: breathing non-labored at rest  Cardiovascular: regular rate and sinus rhythm  Gastrointestinal: soft, non-tender, and mild-distended  Labs:  CBC Latest Ref Rng & Units 07/05/2020 07/04/2020 07/03/2020  WBC 4.0 - 10.5 K/uL 14.0(H) 17.4(H) 17.6(H)  Hemoglobin 12.0 - 15.0 g/dL 8.3(L) 9.4(L) 8.7(L)  Hematocrit 36.0 - 46.0 % 26.7(L) 29.0(L) 28.3(L)  Platelets 150 - 400 K/uL 408(H) 444(H) 438(H)   CMP Latest Ref Rng & Units 07/05/2020 07/04/2020 07/03/2020  Glucose 70 - 99 mg/dL 75 78 84  BUN 6 - 20 mg/dL 17 23(H) 29(H)  Creatinine 0.44 - 1.00 mg/dL 1.68(H) 1.80(H) 1.83(H)  Sodium 135 - 145 mmol/L 132(L) 136 133(L)  Potassium 3.5 - 5.1 mmol/L 3.1(L) 3.4(L) 3.1(L)  Chloride 98 - 111 mmol/L 108 110 109  CO2 22 - 32 mmol/L 16(L) 17(L) 16(L)  Calcium 8.9 - 10.3 mg/dL 9.4 9.9 8.7(L)  Total Protein 6.5 - 8.1 g/dL 5.1(L) - 5.8(L)  Total Bilirubin 0.3 - 1.2 mg/dL 0.7 - 0.8  Alkaline Phos 38 - 126 U/L 117 - 130(H)  AST 15 - 41 U/L 14(L) - 21  ALT 0 - 44 U/L 9 - 11    Imaging studies: No new pertinent imaging studies   Assessment/Plan:  50 y.o.femalewith bile leak3Day Post-Ops/p ERCP with stent placement.  Bile leak: Patient  treated with ERCP and stent placement. Doing well. No sign of complication from the procedure.White blood cell trending down, now at her baseline.  CT did not shows any fluid collection from the bile leak, no biloma.  No further imaging or work-up for her bile leak.  Constipation: Patient with chronic constipation. Her constipation is so severe that her gastroenterologist recommended total colectomy.  She has had 2 enemas during the admission that has decreased to solid stool in the large intestine as per CT scan but she continue with significant dilation with gas.  Recommend to continue follow-up of electrolytes to make sure that there is no disturbance that needs to be addressed.  No significant fluid collection as the cause of this problem. I will continue aggressive bowel regimen.   I added magnesium citrate today.  This made her feel better.  Abdominal distention: Slowly improving with bowel movement.   Appreciate hospitalist management of other medical comorbidities such as acute kidney injury, that is improved today. Agreed to continue antibiotic therapy. Appreciate GI service management and follow-up.  I encouraged the patient to ambulate.  Arnold Long, MD

## 2020-07-05 NOTE — Progress Notes (Signed)
Progress Note    Katherine Perez  [id number] DOB: [birth year]  DOA: 06/29/2020 PCP: Gladstone Lighter, MD    Brief Narrative:     Medical records reviewed and are as summarized below:  Katherine Perez is an 50 y.o. female with medical history significant for recurrent UTI, dyspareunia, incomplete bladder emptying, anxiety, insomnia, asthma, seasonal allergies, neuropathy, presented to the emergency department for chief concerns of right upper quadrant abdominal pain.  Found to have a bile leak on NM scan.  S/p ERCP and stent.  Remains SEVERELY constipated and slow to improve.  Needs lots of encouragement to get out of bed.   Assessment/Plan:   Active Problems:   Leukocytosis   GERD without esophagitis   CKD (chronic kidney disease) stage 3, GFR 30-59 ml/min (HCC)   Acalculous cholecystitis   Bile leak   Other specified diseases of biliary tract   Disease of biliary tract, unspecified   Right upper quadrant abdominal pain due to biliary leak -HIDA scan showed: bile leak -general surgery/GI consult -change IV zosyn to IV merrem in case she could have a UTI that is contributing to her WBCs -s/p ERCP and stent placement -repeat CT scan 3/13: CBD stents with new mild intrahepatic biliary dilatation. Small fluid collection at gallbladder fossa 3.2 x 1.7 cm, may represent sequela of recent bile leak, without air to suggest abscess. Diffuse wall thickening of the duodenum and jejunum question related to preceding surgery and ERCP though duodenitis not excluded  ? UTI -change IV abx to merrem to finish course  Chronic constipation-suspect secondary to chronic pain management, resumed home metoclopramide 10 mg twice daily -encouraged ambulation -aggressive bowel regimen -d/c zofran and replace with compazine for nausea  (due to constipating effects)  Peripheral neuropathy-gabapentin resumed but will need lower dose due to renal failure  Asthma/COPD-inhalers  resumed -Theophylline 400 mg p.o. twice daily resumed -Flonase 2 spray each nare twice daily, Dulera 2 puffs inhalation twice daily, albuterol nebulizers every 4 hours as needed for wheezing and shortness of breath  Urinary retention-Flomax 0.5 mg nightly resumed  Hyperkalemia now hypokalemia -replete to 4 to aide bowel function  AKI on CKD IIIa -strict I/Os -PRN bladder scan -aggressive IVF -avoid nephrotoxic agents  alcohol use -monitor for withdrawal MCV> 100- will add vitamins and check levels    Family Communication/Anticipated D/C date and plan/Code Status   DVT prophylaxis: Lovenox Code Status: Full Code.  Disposition Plan: Status is: inpt  The patient will require care spanning > 2 midnights and should be moved to inpatient because: Inpatient level of care appropriate due to severity of illness  Dispo: The patient is from: Home              Anticipated d/c is to: Home              Patient currently is not medically stable to d/c.   Difficult to place patient No         Medical Consultants:    GS  GI   Subjective:   C/o nausea today  Objective:    Vitals:   07/04/20 2326 07/05/20 0335 07/05/20 0743 07/05/20 1053  BP: 129/81 (!) 145/78 139/83 (!) 156/87  Pulse: (!) 109 98 95 (!) 102  Resp: '16 16 18 16  '$ Temp: 98.1 F (36.7 C) (!) 97.5 F (36.4 C) 98.9 F (37.2 C) 97.8 F (36.6 C)  TempSrc: Oral Oral Oral Oral  SpO2: 99% 100% 100% 100%  Weight:  Height:        Intake/Output Summary (Last 24 hours) at 07/05/2020 1242 Last data filed at 07/05/2020 0558 Gross per 24 hour  Intake -  Output 1650 ml  Net -1650 ml   Filed Weights   06/29/20 1550  Weight: 59 kg    Exam:  General: Appearance:    Chronically ill appearing female in no acute distress   +BS, soft, min tender in LLQ today  Lungs:    respirations unlabored  Heart:    Tachycardic.   MS:   All extremities are intact.   Neurologic:   Awake, alert, oriented x 3. No  apparent focal neurological           defect.      Data Reviewed:   I have personally reviewed following labs and imaging studies:  Labs: Labs show the following:   Basic Metabolic Panel: Recent Labs  Lab 07/01/20 0418 07/02/20 0605 07/02/20 1200 07/03/20 0529 07/04/20 0501 07/05/20 0431  NA 142 136  --  133* 136 132*  K 4.6 3.8  --  3.1* 3.4* 3.1*  CL 114* 111  --  109 110 108  CO2 16* 14*  --  16* 17* 16*  GLUCOSE 116* 90  --  84 78 75  BUN 37* 35*  --  29* 23* 17  CREATININE 1.92* 2.10*  --  1.83* 1.80* 1.68*  CALCIUM 9.2 8.5*  --  8.7* 9.9 9.4  MG  --   --  2.3  --   --   --   PHOS  --   --  3.7  --   --   --    GFR Estimated Creatinine Clearance: 38.1 mL/min (A) (by C-G formula based on SCr of 1.68 mg/dL (H)). Liver Function Tests: Recent Labs  Lab 06/29/20 1602 06/30/20 0447 07/02/20 0605 07/03/20 0529 07/05/20 0431  AST 37 '27 26 21 '$ 14*  ALT '9 8 11 11 9  '$ ALKPHOS 82 87 144* 130* 117  BILITOT 0.6 0.7 0.8 0.8 0.7  PROT 7.1 6.2* 5.9* 5.8* 5.1*  ALBUMIN 3.5 2.9* 2.6* 2.5* 2.1*   Recent Labs  Lab 06/29/20 1602  LIPASE 38   No results for input(s): AMMONIA in the last 168 hours. Coagulation profile No results for input(s): INR, PROTIME in the last 168 hours.  CBC: Recent Labs  Lab 07/01/20 0418 07/02/20 0605 07/03/20 0529 07/04/20 0501 07/05/20 0431  WBC 25.8* 24.8* 17.6* 17.4* 14.0*  NEUTROABS  --   --   --   --  8.1*  HGB 11.9* 9.4* 8.7* 9.4* 8.3*  HCT 39.0 29.3* 28.3* 29.0* 26.7*  MCV 105.4* 102.4* 104.0* 101.0* 101.5*  PLT 568* 443* 438* 444* 408*   Cardiac Enzymes: No results for input(s): CKTOTAL, CKMB, CKMBINDEX, TROPONINI in the last 168 hours. BNP (last 3 results) No results for input(s): PROBNP in the last 8760 hours. CBG: No results for input(s): GLUCAP in the last 168 hours. D-Dimer: No results for input(s): DDIMER in the last 72 hours. Hgb A1c: No results for input(s): HGBA1C in the last 72 hours. Lipid Profile: No results  for input(s): CHOL, HDL, LDLCALC, TRIG, CHOLHDL, LDLDIRECT in the last 72 hours. Thyroid function studies: No results for input(s): TSH, T4TOTAL, T3FREE, THYROIDAB in the last 72 hours.  Invalid input(s): FREET3 Anemia work up: Recent Labs    07/03/20 0529 07/04/20 0501  VITAMINB12  --  1,005*  FERRITIN 356*  --   TIBC 225*  --   IRON  21*  --    Sepsis Labs: Recent Labs  Lab 07/02/20 0605 07/03/20 0529 07/04/20 0501 07/05/20 0431  WBC 24.8* 17.6* 17.4* 14.0*    Microbiology Recent Results (from the past 240 hour(s))  Urine Culture     Status: Abnormal   Collection Time: 06/29/20  4:02 PM   Specimen: Urine, Random  Result Value Ref Range Status   Specimen Description   Final    URINE, RANDOM Performed at [facility], [address], [place], [place] [address]    Special Requests   Final    NONE Performed at [facility], [address] [place],  [address]    Culture (A)  Final    >=100,000 COLONIES/mL KLEBSIELLA PNEUMONIAE Confirmed Extended Spectrum Beta-Lactamase Producer (ESBL).  In bloodstream infections from ESBL organisms, carbapenems are preferred over piperacillin/tazobactam. They are shown to have a lower risk of mortality.    Report Status 07/02/2020 FINAL  Final   Organism ID, Bacteria KLEBSIELLA PNEUMONIAE (A)  Final      Susceptibility   Klebsiella pneumoniae - MIC*    AMPICILLIN >=32 RESISTANT Resistant     CEFAZOLIN >=64 RESISTANT Resistant     CEFEPIME 2 SENSITIVE Sensitive     CEFTRIAXONE >=64 RESISTANT Resistant     CIPROFLOXACIN >=4 RESISTANT Resistant     GENTAMICIN >=16 RESISTANT Resistant     IMIPENEM <=0.25 SENSITIVE Sensitive     NITROFURANTOIN 64 INTERMEDIATE Intermediate     TRIMETH/SULFA >=320 RESISTANT Resistant     AMPICILLIN/SULBACTAM >=32 RESISTANT Resistant     PIP/TAZO 8 SENSITIVE Sensitive     * >=100,000 COLONIES/mL KLEBSIELLA PNEUMONIAE  Resp Panel by RT-PCR (Flu A&B, Covid) Nasopharyngeal Swab      Status: None   Collection Time: 06/29/20  7:11 PM   Specimen: Nasopharyngeal Swab; Nasopharyngeal(NP) swabs in vial transport medium  Result Value Ref Range Status   SARS Coronavirus 2 by RT PCR NEGATIVE NEGATIVE Final    Comment: (NOTE) SARS-CoV-2 target nucleic acids are NOT DETECTED.  The SARS-CoV-2 RNA is generally detectable in upper respiratory specimens during the acute phase of infection. The lowest concentration of SARS-CoV-2 viral copies this assay can detect is 138 copies/mL. A negative result does not preclude SARS-Cov-2 infection and should not be used as the sole basis for treatment or other patient management decisions. A negative result may occur with  improper specimen collection/handling, submission of specimen other than nasopharyngeal swab, presence of viral mutation(s) within the areas targeted by this assay, and inadequate number of viral copies(<138 copies/mL). A negative result must be combined with clinical observations, patient history, and epidemiological information. The expected result is Negative.  Fact Sheet for Patients:  EntrepreneurPulse.com.au  Fact Sheet for Healthcare Providers:  IncredibleEmployment.be  This test is no t yet approved or cleared by the [place] FDA and  has been authorized for detection and/or diagnosis of SARS-CoV-2 by FDA under an Emergency Use Authorization (EUA). This EUA will remain  in effect (meaning this test can be used) for the duration of the COVID-19 declaration under Section 564(b)(1) of the Act, 21 U.S.C.section 360bbb-3(b)(1), unless the authorization is terminated  or revoked sooner.       Influenza A by PCR NEGATIVE NEGATIVE Final   Influenza B by PCR NEGATIVE NEGATIVE Final    Comment: (NOTE) The Xpert Xpress SARS-CoV-2/FLU/RSV plus assay is intended as an aid in the diagnosis of influenza from Nasopharyngeal swab specimens and should not be used as a sole basis  for treatment. Nasal washings and aspirates are unacceptable for Xpert Xpress SARS-CoV-2/FLU/RSV testing.  Fact Sheet for Patients: EntrepreneurPulse.com.au  Fact Sheet for Healthcare Providers: IncredibleEmployment.be  This test is not yet approved or cleared by the [place] FDA and has been authorized for detection and/or diagnosis of SARS-CoV-2 by FDA under an Emergency Use Authorization (EUA). This EUA will remain in effect (meaning this test can be used) for the duration of the COVID-19 declaration under Section 564(b)(1) of the Act, 21 U.S.C. section 360bbb-3(b)(1), unless the authorization is terminated or revoked.  Performed at [facility], [address] [place], [place] [address]   CULTURE, BLOOD (ROUTINE X 2) w Reflex to ID Panel     Status: None   Collection Time: 06/29/20 10:18 PM   Specimen: BLOOD  Result Value Ref Range Status   Specimen Description BLOOD BLOOD RIGHT ARM  Final   Special Requests   Final    BOTTLES DRAWN AEROBIC AND ANAEROBIC Blood Culture results may not be optimal due to an inadequate volume of blood received in culture bottles   Culture   Final    NO GROWTH 5 DAYS Performed at [facility], [address] [place], [place] [address]    Report Status 07/04/2020 FINAL  Final  CULTURE, BLOOD (ROUTINE X 2) w Reflex to ID Panel     Status: None   Collection Time: 06/29/20 10:34 PM   Specimen: BLOOD  Result Value Ref Range Status   Specimen Description BLOOD BLOOD RIGHT ARM  Final   Special Requests   Final    BOTTLES DRAWN AEROBIC AND ANAEROBIC Blood Culture results may not be optimal due to an inadequate volume of blood received in culture bottles   Culture   Final    NO GROWTH 5 DAYS Performed at [facility], [address] [place], [place] [address]    Report Status 07/04/2020 FINAL  Final    Procedures and diagnostic studies:  CT ABDOMEN PELVIS WO  CONTRAST  Result Date: 07/04/2020 CLINICAL DATA:  Bile leak post cholecystectomy, treated by ERCP and stenting, follow-up, continued upper quadrant abdominal pain EXAM: CT ABDOMEN AND PELVIS WITHOUT CONTRAST TECHNIQUE: Multidetector CT imaging of the abdomen and pelvis was performed following the standard protocol without IV contrast. Sagittal and coronal MPR images reconstructed from axial data set. Dilute oral contrast was administered. COMPARISON:  06/30/2020 FINDINGS: Lower chest: Small RIGHT pleural effusion and RIGHT basilar atelectasis Hepatobiliary: Small fluid collection at gallbladder fossa post cholecystectomy 3.2 x 1.7 cm image 34, without air. CBD stents extending to duodenum. Mild intrahepatic biliary dilatation, new. No additional hepatic abnormalities. Pancreas: Unremarkable Spleen: Normal appearance Adrenals/Urinary Tract: Small nonobstructing calculus at inferior pole LEFT kidney. Adrenal glands, kidneys, and bladder otherwise normal appearance. Stomach/Bowel: Normal appendix in RIGHT mid abdomen. Diffuse colonic distention. Prominent stool in cecum and proximal ascending colon. Diffuse wall thickening of the duodenum greatest at second portion may be related to preceding surgery and ERCP though duodenitis not excluded. Mild stranding of adjacent fat planes without extraluminal gas. Bowel wall thickening of additional jejunal loops which may be related to surgery. Remaining bowel loops and stomach unremarkable. Vascular/Lymphatic: Atherosclerotic calcifications aorta and iliac arteries without aneurysm. No adenopathy. Reproductive: Question atrophic uterus, poorly visualized. Other: No free air or free fluid. Small RIGHT inguinal hernia containing fat. Extensive subcutaneous edema especially at the flanks. Musculoskeletal: Disc space narrowing L4-L5. IMPRESSION: CBD stents with new mild intrahepatic biliary dilatation. Small fluid collection at gallbladder fossa 3.2  x 1.7 cm, may represent  sequela of recent bile leak, without air to suggest abscess. Diffuse wall thickening of the duodenum and jejunum question related to preceding surgery and ERCP though duodenitis not excluded. Small RIGHT pleural effusion and RIGHT basilar atelectasis. RIGHT inguinal hernia containing fat. Nonobstructive LEFT renal calculus. Aortic Atherosclerosis (ICD10-I70.0). Electronically Signed   By: Lavonia Dana M.D.   On: 07/04/2020 10:42    Medications:   . amitriptyline  25 mg Oral QHS  . bisacodyl  45 mg Oral BID  . docusate sodium  100 mg Oral BID  . enoxaparin (LOVENOX) injection  40 mg Subcutaneous Q24H  . famotidine  40 mg Oral QHS  . fluticasone  2 spray Each Nare BID  . folic acid  1 mg Oral Daily  . gabapentin  300 mg Oral BID  . metoCLOPramide  10 mg Oral BID  . mometasone-formoterol  2 puff Inhalation BID  . nicotine  14 mg Transdermal Daily  . oxybutynin  10 mg Oral Q1200  . pantoprazole  40 mg Oral Daily  . potassium chloride  40 mEq Oral BID  . senna-docusate  1 tablet Oral Daily  . sodium citrate-citric acid  15 mL Oral BID  . sodium phosphate  1 enema Rectal Once  . tamsulosin  0.4 mg Oral QHS  . theophylline  400 mg Oral BID  . thiamine injection  100 mg Intravenous Daily   Continuous Infusions: . lactated ringers 50 mL/hr at 07/05/20 1030  . meropenem (MERREM) IV 1 g (07/05/20 0956)     LOS: 5 days   Geradine Girt  [facility]   How to contact the [facility] Attending or Consulting provider [place] or covering provider during after hours [place], for this patient?  1. Check the care team in [facility] and look for a) attending/consulting [facility] provider listed and b) the [facility] team listed 2. Log into [email or web address] and use [place]'s universal password to access. If you do not have the password, please contact the hospital operator. 3. Locate the [facility] provider you are looking for under [facility] and page to a number that you can be directly reached. 4. If you still have  difficulty reaching the provider, please page the [facility] (Director on Call) for the Hospitalists listed on amion for assistance.  07/05/2020, 12:42 PM

## 2020-07-05 NOTE — Progress Notes (Signed)
Katherine Antigua, MD [address] [place], [place], [place], [address] [address], [place], [place], [place], [address] Phone: [phone]  Fax: [phone]   Subjective: Patient reports having 1-2 bowel movements today that were large size.  Reports improvement in abdominal distention.  No nausea or vomiting.  States she feels that she is starting to improve clinically.   Objective: Exam: Vital signs in last 24 hours: Vitals:   07/05/20 0335 07/05/20 0743 07/05/20 1053 07/05/20 1323  BP: (!) 145/78 139/83 (!) 156/87 126/80  Pulse: 98 95 (!) 102 (!) 103  Resp: '16 18 16 18  '$ Temp: (!) 97.5 F (36.4 C) 98.9 F (37.2 C) 97.8 F (36.6 C) 99.1 F (37.3 C)  TempSrc: Oral Oral Oral Oral  SpO2: 100% 100% 100% 100%  Weight:      Height:       Weight change:   Intake/Output Summary (Last 24 hours) at 07/05/2020 1459 Last data filed at 07/05/2020 0558 Gross per 24 hour  Intake --  Output 1650 ml  Net -1650 ml    General: No acute distress, AAO x3 Abd: Soft, nontender, mildly distended,, No HSM Skin: Warm, no rashes Neck: Supple, Trachea midline   Lab Results: Lab Results  Component Value Date   WBC 14.0 (H) 07/05/2020   HGB 8.3 (L) 07/05/2020   HCT 26.7 (L) 07/05/2020   MCV 101.5 (H) 07/05/2020   PLT 408 (H) 07/05/2020   Micro Results: Recent Results (from the past 240 hour(s))  Urine Culture     Status: Abnormal   Collection Time: 06/29/20  4:02 PM   Specimen: Urine, Random  Result Value Ref Range Status   Specimen Description   Final    URINE, RANDOM Performed at [facility], [address], [place], [place] [address]    Special Requests   Final    NONE Performed at [facility], [address] [place], [place] [address]    Culture (A)  Final    >=100,000 COLONIES/mL KLEBSIELLA PNEUMONIAE Confirmed Extended Spectrum Beta-Lactamase Producer (ESBL).  In bloodstream infections from ESBL organisms, carbapenems are preferred over  piperacillin/tazobactam. They are shown to have a lower risk of mortality.    Report Status 07/02/2020 FINAL  Final   Organism ID, Bacteria KLEBSIELLA PNEUMONIAE (A)  Final      Susceptibility   Klebsiella pneumoniae - MIC*    AMPICILLIN >=32 RESISTANT Resistant     CEFAZOLIN >=64 RESISTANT Resistant     CEFEPIME 2 SENSITIVE Sensitive     CEFTRIAXONE >=64 RESISTANT Resistant     CIPROFLOXACIN >=4 RESISTANT Resistant     GENTAMICIN >=16 RESISTANT Resistant     IMIPENEM <=0.25 SENSITIVE Sensitive     NITROFURANTOIN 64 INTERMEDIATE Intermediate     TRIMETH/SULFA >=320 RESISTANT Resistant     AMPICILLIN/SULBACTAM >=32 RESISTANT Resistant     PIP/TAZO 8 SENSITIVE Sensitive     * >=100,000 COLONIES/mL KLEBSIELLA PNEUMONIAE  Resp Panel by RT-PCR (Flu A&B, Covid) Nasopharyngeal Swab     Status: None   Collection Time: 06/29/20  7:11 PM   Specimen: Nasopharyngeal Swab; Nasopharyngeal(NP) swabs in vial transport medium  Result Value Ref Range Status   SARS Coronavirus 2 by RT PCR NEGATIVE NEGATIVE Final    Comment: (NOTE) SARS-CoV-2 target nucleic acids are NOT DETECTED.  The SARS-CoV-2 RNA is generally detectable in upper respiratory specimens during the acute phase of infection. The lowest concentration of SARS-CoV-2 viral copies this assay can detect is 138 copies/mL. A  negative result does not preclude SARS-Cov-2 infection and should not be used as the sole basis for treatment or other patient management decisions. A negative result may occur with  improper specimen collection/handling, submission of specimen other than nasopharyngeal swab, presence of viral mutation(s) within the areas targeted by this assay, and inadequate number of viral copies(<138 copies/mL). A negative result must be combined with clinical observations, patient history, and epidemiological information. The expected result is Negative.  Fact Sheet for Patients:  EntrepreneurPulse.com.au  Fact  Sheet for Healthcare Providers:  IncredibleEmployment.be  This test is no t yet approved or cleared by the [place] FDA and  has been authorized for detection and/or diagnosis of SARS-CoV-2 by FDA under an Emergency Use Authorization (EUA). This EUA will remain  in effect (meaning this test can be used) for the duration of the COVID-19 declaration under Section 564(b)(1) of the Act, 21 U.S.C.section 360bbb-3(b)(1), unless the authorization is terminated  or revoked sooner.       Influenza A by PCR NEGATIVE NEGATIVE Final   Influenza B by PCR NEGATIVE NEGATIVE Final    Comment: (NOTE) The Xpert Xpress SARS-CoV-2/FLU/RSV plus assay is intended as an aid in the diagnosis of influenza from Nasopharyngeal swab specimens and should not be used as a sole basis for treatment. Nasal washings and aspirates are unacceptable for Xpert Xpress SARS-CoV-2/FLU/RSV testing.  Fact Sheet for Patients: EntrepreneurPulse.com.au  Fact Sheet for Healthcare Providers: IncredibleEmployment.be  This test is not yet approved or cleared by the [place] FDA and has been authorized for detection and/or diagnosis of SARS-CoV-2 by FDA under an Emergency Use Authorization (EUA). This EUA will remain in effect (meaning this test can be used) for the duration of the COVID-19 declaration under Section 564(b)(1) of the Act, 21 U.S.C. section 360bbb-3(b)(1), unless the authorization is terminated or revoked.  Performed at [facility], [address] [place], [place] [address]   CULTURE, BLOOD (ROUTINE X 2) w Reflex to ID Panel     Status: None   Collection Time: 06/29/20 10:18 PM   Specimen: BLOOD  Result Value Ref Range Status   Specimen Description BLOOD BLOOD RIGHT ARM  Final   Special Requests   Final    BOTTLES DRAWN AEROBIC AND ANAEROBIC Blood Culture results may not be optimal due to an inadequate volume of blood received in  culture bottles   Culture   Final    NO GROWTH 5 DAYS Performed at [facility] [address] [place], [place] [address]    Report Status 07/04/2020 FINAL  Final  CULTURE, BLOOD (ROUTINE X 2) w Reflex to ID Panel     Status: None   Collection Time: 06/29/20 10:34 PM   Specimen: BLOOD  Result Value Ref Range Status   Specimen Description BLOOD BLOOD RIGHT ARM  Final   Special Requests   Final    BOTTLES DRAWN AEROBIC AND ANAEROBIC Blood Culture results may not be optimal due to an inadequate volume of blood received in culture bottles   Culture   Final    NO GROWTH 5 DAYS Performed at [facility], [address], [place], [place] [address]    Report Status 07/04/2020 FINAL  Final   Studies/Results: CT ABDOMEN PELVIS WO CONTRAST  Result Date: 07/04/2020 CLINICAL DATA:  Bile leak post cholecystectomy, treated by ERCP and stenting, follow-up, continued upper quadrant abdominal pain EXAM: CT ABDOMEN AND PELVIS WITHOUT CONTRAST TECHNIQUE: Multidetector CT imaging of the abdomen and pelvis was performed  following the standard protocol without IV contrast. Sagittal and coronal MPR images reconstructed from axial data set. Dilute oral contrast was administered. COMPARISON:  06/30/2020 FINDINGS: Lower chest: Small RIGHT pleural effusion and RIGHT basilar atelectasis Hepatobiliary: Small fluid collection at gallbladder fossa post cholecystectomy 3.2 x 1.7 cm image 34, without air. CBD stents extending to duodenum. Mild intrahepatic biliary dilatation, new. No additional hepatic abnormalities. Pancreas: Unremarkable Spleen: Normal appearance Adrenals/Urinary Tract: Small nonobstructing calculus at inferior pole LEFT kidney. Adrenal glands, kidneys, and bladder otherwise normal appearance. Stomach/Bowel: Normal appendix in RIGHT mid abdomen. Diffuse colonic distention. Prominent stool in cecum and proximal ascending colon. Diffuse wall thickening of the duodenum greatest at second  portion may be related to preceding surgery and ERCP though duodenitis not excluded. Mild stranding of adjacent fat planes without extraluminal gas. Bowel wall thickening of additional jejunal loops which may be related to surgery. Remaining bowel loops and stomach unremarkable. Vascular/Lymphatic: Atherosclerotic calcifications aorta and iliac arteries without aneurysm. No adenopathy. Reproductive: Question atrophic uterus, poorly visualized. Other: No free air or free fluid. Small RIGHT inguinal hernia containing fat. Extensive subcutaneous edema especially at the flanks. Musculoskeletal: Disc space narrowing L4-L5. IMPRESSION: CBD stents with new mild intrahepatic biliary dilatation. Small fluid collection at gallbladder fossa 3.2 x 1.7 cm, may represent sequela of recent bile leak, without air to suggest abscess. Diffuse wall thickening of the duodenum and jejunum question related to preceding surgery and ERCP though duodenitis not excluded. Small RIGHT pleural effusion and RIGHT basilar atelectasis. RIGHT inguinal hernia containing fat. Nonobstructive LEFT renal calculus. Aortic Atherosclerosis (ICD10-I70.0). Electronically Signed   By: Lavonia Dana M.D.   On: 07/04/2020 10:42   Medications:  Scheduled Meds: . amitriptyline  25 mg Oral QHS  . bisacodyl  45 mg Oral BID  . docusate sodium  100 mg Oral BID  . enoxaparin (LOVENOX) injection  40 mg Subcutaneous Q24H  . famotidine  40 mg Oral QHS  . fluticasone  2 spray Each Nare BID  . folic acid  1 mg Oral Daily  . gabapentin  300 mg Oral BID  . metoCLOPramide  10 mg Oral BID  . mometasone-formoterol  2 puff Inhalation BID  . nicotine  14 mg Transdermal Daily  . oxybutynin  10 mg Oral Q1200  . pantoprazole  40 mg Oral Daily  . potassium chloride  40 mEq Oral BID  . senna-docusate  1 tablet Oral Daily  . sodium citrate-citric acid  15 mL Oral BID  . sodium phosphate  1 enema Rectal Once  . tamsulosin  0.4 mg Oral QHS  . theophylline  400 mg Oral  BID  . thiamine injection  100 mg Intravenous Daily   Continuous Infusions: . meropenem (MERREM) IV 1 g (07/05/20 0956)   PRN Meds:.albuterol, cetaphil, diphenhydrAMINE, HYDROcodone-acetaminophen, iohexol, morphine injection, prochlorperazine, promethazine   Assessment: Active Problems:   Leukocytosis   GERD without esophagitis   CKD (chronic kidney disease) stage 3, GFR 30-59 ml/min (HCC)   Acalculous cholecystitis   Bile leak   Other specified diseases of biliary tract   Disease of biliary tract, unspecified    Plan: Labs today show hypokalemia, please replace as necessary and recheck Electrolyte replacement will help with her abdominal distention She is clinically improving and having flatus and bowel movements Continue bowel regimen and titrate as necessary     LOS: 5 days   Katherine Antigua, MD 07/05/2020, 2:59 PM

## 2020-07-06 DIAGNOSIS — R140 Abdominal distension (gaseous): Secondary | ICD-10-CM | POA: Diagnosis not present

## 2020-07-06 DIAGNOSIS — K839 Disease of biliary tract, unspecified: Secondary | ICD-10-CM | POA: Diagnosis not present

## 2020-07-06 LAB — BASIC METABOLIC PANEL
Anion gap: 7 (ref 5–15)
BUN: 16 mg/dL (ref 6–20)
CO2: 18 mmol/L — ABNORMAL LOW (ref 22–32)
Calcium: 8.8 mg/dL — ABNORMAL LOW (ref 8.9–10.3)
Chloride: 108 mmol/L (ref 98–111)
Creatinine, Ser: 1.59 mg/dL — ABNORMAL HIGH (ref 0.44–1.00)
GFR, Estimated: 40 mL/min — ABNORMAL LOW (ref >60)
Glucose, Bld: 71 mg/dL (ref 70–99)
Potassium: 4.4 mmol/L (ref 3.5–5.1)
Sodium: 133 mmol/L — ABNORMAL LOW (ref 135–145)

## 2020-07-06 LAB — CBC
HCT: 31.2 % — ABNORMAL LOW (ref 36.0–46.0)
Hemoglobin: 9.6 g/dL — ABNORMAL LOW (ref 12.0–15.0)
MCH: 31.5 pg (ref 26.0–34.0)
MCHC: 30.8 g/dL (ref 30.0–36.0)
MCV: 102.3 fL — ABNORMAL HIGH (ref 80.0–100.0)
Platelets: 485 10*3/uL — ABNORMAL HIGH (ref 150–400)
RBC: 3.05 MIL/uL — ABNORMAL LOW (ref 3.87–5.11)
RDW: 14.6 % (ref 11.5–15.5)
WBC: 15.5 10*3/uL — ABNORMAL HIGH (ref 4.0–10.5)
nRBC: 0 % (ref 0.0–0.2)

## 2020-07-06 MED ORDER — POLYSACCHARIDE IRON COMPLEX 150 MG PO CAPS: 150.0000 mg | ORAL_CAPSULE | Freq: Every day | ORAL | 0 refills | Status: DC

## 2020-07-06 MED ORDER — DULCOLAX 5 MG PO TBEC: 10.0000 mg | DELAYED_RELEASE_TABLET | Freq: Every evening | ORAL | 1 refills | Status: AC | PRN

## 2020-07-06 MED ORDER — POLYETHYLENE GLYCOL 3350 17 G PO PACK: 17.0000 g | PACK | Freq: Every day | ORAL | 0 refills | Status: DC

## 2020-07-06 MED ORDER — MAGNESIUM CITRATE PO SOLN
0.5000 | Freq: Once | ORAL | Status: AC
  Administered 2020-07-06: 0.5 via ORAL
  Filled 2020-07-06: qty 296

## 2020-07-06 MED ORDER — MELATONIN 3 MG PO TABS: 6.0000 mg | ORAL_TABLET | Freq: Every day | ORAL | 0 refills | Status: AC

## 2020-07-06 MED ORDER — AMOXICILLIN-POT CLAVULANATE 875-125 MG PO TABS: 1.0000 | ORAL_TABLET | Freq: Two times a day (BID) | ORAL | 0 refills | Status: AC

## 2020-07-06 NOTE — Discharge Summary (Addendum)
[facility] Discharge Summary   Patient: Katherine Perez [id number]  PCP: Gladstone Lighter, MD  Date of admission: 06/29/2020   Date of discharge:  07/06/2020     Discharge Diagnoses:  Principal diagnosis Acute cholecystitis Active Problems:   Leukocytosis   GERD without esophagitis   CKD (chronic kidney disease) stage 3, GFR 30-59 ml/min (HCC)   Acalculous cholecystitis   Bile leak   Other specified diseases of biliary tract   Disease of biliary tract, unspecified   Admitted From: Home Disposition:  Home   Recommendations for Outpatient Follow-up:  1. PCP: 1 wk 2. General surgery in 1 week 3. GI in 3 months for stent removal 4. Follow up LABS/TEST: CBC and BMP after 1 week   Diet recommendation: Cardiac diet  Activity: The patient is advised to gradually reintroduce usual activities, as tolerated  Discharge Condition: stable  Code Status: Full code   History of present illness: As per the H and P dictated on admission Hospital Course:  Katherine Perez is an 50 y.o. female with medical history significant forrecurrent UTI, dyspareunia, incompletebladder emptying, anxiety, insomnia, asthma,seasonal allergies, neuropathy, presented to the emergency department for chief concerns of right upper quadrant abdominal pain.  Found to have a bile leak on NM scan.  S/p ERCP and stent.  Remains SEVERELY constipated and slow to improve.  Needs lots of encouragement to get out of bed.  General surgery and GI were consulted. Sepsis ruled out # Right upper quadrant abdominal pain due to biliary leak, HIDA scan showed: bile leak general surgery/GI consult, s/p  IV zosyn switched to to IV merrem in case she could have a UTI that is contributing to her WBCs. s/p ERCP and stent placement. repeat CT scan 3/13: CBD stents with new mild intrahepatic biliary dilatation. Small fluid collection at gallbladder fossa 3.2 x 1.7 cm, mayrepresent sequela of recent bile leak, without air to suggest  abscess. Diffuse wall thickening of the duodenum and jejunum question related to preceding surgery and ERCP though duodenitis not excluded General surgery cleared patient to discharge home without any need of antibiotics and follow-up in 1 week.  Recommended to follow with the GI as an outpatient in 3 months for stent removal. # UTI, urine culture positive for Klebsiella ESBL, patient was treated with meropenem, remained afebrile, WBC count slightly elevated, recommend to repeat CBC after 1 week and follow with PCP. #Chronic constipation-suspect secondary to chronic pain management, resumed home metoclopramide 10 mg twice daily. encouraged ambulation, aggressive bowel regimen. -d/c zofran and replace with compazine for nausea  (due to constipating effects) continue laxative and follow with PCP for further management #Peripheral neuropathy-gabapentin resumed #Asthma/COPD-inhalers resumed, Theophylline 400 mg p.o. twice daily resumed Flonase 2 spray each nare twice daily, Dulera 2 puffs inhalation twice daily, albuterol nebulizers every 4 hours as needed for wheezing and shortness of breath #Urinary retention-Flomax 0.5 mg nightly resumed #Hyperkalemia now hypokalemia, resolved, K wnl now and d/c'd home K # AKI on CKD IIIa, stable, repeat BMP after 1 week and follow with PCP for further management. # alcohol use, no withdrawal symptoms. MCV> 100-1 B12 level1005will add vitamins and check levels #Iron deficiency, iron saturation 9%, started oral iron supplement.  With PCP and repeat iron level after 3 to 6 months. Body mass index is 19.2 kg/m.   Patient was ambulatory without any assistance. On the day of the discharge the patient's vitals were stable, and no other acute medical condition were reported by patient. the patient was felt  safe to be discharge at Home .  Consultants: General surgery and GI Procedures: s/p ERCP and biliary stent insertion  Discharge Exam: General: Appear in no distress,  no Rash; Oral Mucosa Clear, moist. Cardiovascular: S1 and S2 Present, no Murmur, Respiratory: normal respiratory effort, Bilateral Air entry present and no Crackles, no wheezes Abdomen: Bowel Sound present, Soft and no tenderness, no hernia Extremities: no Pedal edema, no calf tenderness Neurology: alert and oriented to time, place, and person affect appropriate.  Filed Weights   06/29/20 1550  Weight: 59 kg   Vitals:   07/06/20 0849 07/06/20 1222  BP: (!) 155/86 (!) 153/90  Pulse: 98 (!) 106  Resp: 16 17  Temp: 99 F (37.2 C) 99 F (37.2 C)  SpO2: 100% 100%    DISCHARGE MEDICATION: Allergies as of 07/06/2020      Reactions   Cetirizine Hives   Diazepam Other (See Comments)   Depression   Baclofen Anxiety, Other (See Comments)   AMS - "Really messed me up, caused me to drop things"      Medication List    STOP taking these medications   acidophilus Caps capsule   Diclofenac-miSOPROStol 75-0.2 MG Tbec   docusate sodium 100 MG capsule Commonly known as: COLACE   HYDROcodone-acetaminophen 5-325 MG tablet Commonly known as: Norco   magnesium oxide 400 (241.3 Mg) MG tablet Commonly known as: MAG-OX   potassium chloride SA 20 MEQ tablet Commonly known as: KLOR-CON   thiamine 100 MG tablet     TAKE these medications   Advil Dual Action 125-250 MG Tabs Generic drug: Ibuprofen-Acetaminophen Take 2 tablets by mouth daily as needed (pain/headache).   albuterol 108 (90 Base) MCG/ACT inhaler Commonly known as: VENTOLIN HFA Inhale 2 puffs into the lungs every 4 (four) hours as needed for shortness of breath or wheezing.   amitriptyline 25 MG tablet Commonly known as: ELAVIL Take 25-50 mg by mouth at bedtime.   amoxicillin-clavulanate 875-125 MG tablet Commonly known as: Augmentin Take 1 tablet by mouth 2 (two) times daily for 3 days.   Brimonidine Tartrate 0.025 % Soln Place 1 drop into both eyes daily.   Dulcolax 5 MG EC tablet Generic drug:  bisacodyl Take 2 tablets (10 mg total) by mouth at bedtime as needed for moderate constipation.   famotidine 40 MG tablet Commonly known as: PEPCID Take 40 mg by mouth at bedtime.   fexofenadine 180 MG tablet Commonly known as: ALLEGRA Take 180 mg by mouth daily as needed for allergies.   FIBER PO Take 1 capsule by mouth daily.   fluticasone 50 MCG/ACT nasal spray Commonly known as: FLONASE Place 2 sprays into both nostrils 2 (two) times daily.   Fluticasone-Salmeterol 250-50 MCG/DOSE Aepb Commonly known as: ADVAIR Inhale 1 puff into the lungs 2 (two) times daily. What changed: Another medication with the same name was removed. Continue taking this medication, and follow the directions you see here.   gabapentin 300 MG capsule Commonly known as: NEURONTIN Take 1 capsule (300 mg total) by mouth 3 (three) times daily. What changed:   when to take this  additional instructions   hydrOXYzine 25 MG tablet Commonly known as: ATARAX/VISTARIL Take 25-50 mg by mouth at bedtime.   iron polysaccharides 150 MG capsule Commonly known as: NIFEREX Take 1 capsule (150 mg total) by mouth daily.   medroxyPROGESTERone 150 MG/ML injection Commonly known as: DEPO-PROVERA Inject 150 mg into the muscle every 3 (three) months.   melatonin 3 MG Tabs tablet  Take 2 tablets (6 mg total) by mouth at bedtime.   metoCLOPramide 10 MG tablet Commonly known as: REGLAN Take 10 mg by mouth in the morning and at bedtime.   ondansetron 8 MG tablet Commonly known as: ZOFRAN Take 8 mg by mouth every 8 (eight) hours as needed for vomiting or nausea.   oxybutynin 10 MG 24 hr tablet Commonly known as: DITROPAN-XL Take 10 mg by mouth daily at 12 noon.   pentosan polysulfate 100 MG capsule Commonly known as: ELMIRON Take 100 mg by mouth at bedtime.   polyethylene glycol 17 g packet Commonly known as: MiraLax Take 17 g by mouth daily.   promethazine 25 MG tablet Commonly known as:  PHENERGAN Take 25 mg by mouth every 8 (eight) hours as needed for nausea or vomiting.   RABEprazole 20 MG tablet Commonly known as: ACIPHEX Take 20 mg by mouth in the morning and at bedtime.   sodium citrate-citric acid 500-334 MG/5ML solution Commonly known as: ORACIT Take 15 mLs by mouth in the morning and at bedtime.   tamsulosin 0.4 MG Caps capsule Commonly known as: FLOMAX Take 0.4 mg by mouth at bedtime.   theophylline 400 MG 24 hr tablet Commonly known as: UNIPHYL Take 400 mg by mouth 2 (two) times daily.   traMADol 50 MG tablet Commonly known as: ULTRAM Take 50 mg by mouth every 6 (six) hours as needed for moderate pain.   trimethoprim 100 MG tablet Commonly known as: TRIMPEX Take 100 mg by mouth daily.   Trulance 3 MG Tabs Generic drug: Plecanatide Take 3 mg by mouth daily at 6 (six) AM.   Vitamin D 125 MCG (5000 UT) Caps Take 5,000 Units by mouth daily.      Allergies  Allergen Reactions  . Cetirizine Hives  . Diazepam Other (See Comments)    Depression   . Baclofen Anxiety and Other (See Comments)    AMS - "Really messed me up, caused me to drop things"    Discharge Instructions    Diet - low sodium heart healthy   Complete by: As directed    Discharge instructions   Complete by: As directed    Follow with PCP in 1 week, repeat CBC and BMP to check WBC count and renal functions in 1 week Follow with general surgery in 1 week F/u GI in 3 months for biliary stent removal   Increase activity slowly   Complete by: As directed    No wound care   Complete by: As directed       The results of significant diagnostics from this hospitalization (including imaging, microbiology, ancillary and laboratory) are listed below for reference.    Significant Diagnostic Studies: CT ABDOMEN PELVIS WO CONTRAST  Result Date: 07/04/2020 CLINICAL DATA:  Bile leak post cholecystectomy, treated by ERCP and stenting, follow-up, continued upper quadrant abdominal pain  EXAM: CT ABDOMEN AND PELVIS WITHOUT CONTRAST TECHNIQUE: Multidetector CT imaging of the abdomen and pelvis was performed following the standard protocol without IV contrast. Sagittal and coronal MPR images reconstructed from axial data set. Dilute oral contrast was administered. COMPARISON:  06/30/2020 FINDINGS: Lower chest: Small RIGHT pleural effusion and RIGHT basilar atelectasis Hepatobiliary: Small fluid collection at gallbladder fossa post cholecystectomy 3.2 x 1.7 cm image 34, without air. CBD stents extending to duodenum. Mild intrahepatic biliary dilatation, new. No additional hepatic abnormalities. Pancreas: Unremarkable Spleen: Normal appearance Adrenals/Urinary Tract: Small nonobstructing calculus at inferior pole LEFT kidney. Adrenal glands, kidneys, and bladder otherwise normal  appearance. Stomach/Bowel: Normal appendix in RIGHT mid abdomen. Diffuse colonic distention. Prominent stool in cecum and proximal ascending colon. Diffuse wall thickening of the duodenum greatest at second portion may be related to preceding surgery and ERCP though duodenitis not excluded. Mild stranding of adjacent fat planes without extraluminal gas. Bowel wall thickening of additional jejunal loops which may be related to surgery. Remaining bowel loops and stomach unremarkable. Vascular/Lymphatic: Atherosclerotic calcifications aorta and iliac arteries without aneurysm. No adenopathy. Reproductive: Question atrophic uterus, poorly visualized. Other: No free air or free fluid. Small RIGHT inguinal hernia containing fat. Extensive subcutaneous edema especially at the flanks. Musculoskeletal: Disc space narrowing L4-L5. IMPRESSION: CBD stents with new mild intrahepatic biliary dilatation. Small fluid collection at gallbladder fossa 3.2 x 1.7 cm, may represent sequela of recent bile leak, without air to suggest abscess. Diffuse wall thickening of the duodenum and jejunum question related to preceding surgery and ERCP though  duodenitis not excluded. Small RIGHT pleural effusion and RIGHT basilar atelectasis. RIGHT inguinal hernia containing fat. Nonobstructive LEFT renal calculus. Aortic Atherosclerosis (ICD10-I70.0). Electronically Signed   By: Lavonia Dana M.D.   On: 07/04/2020 10:42   CT ABDOMEN PELVIS WO CONTRAST  Result Date: 06/30/2020 CLINICAL DATA:  Increased abdominal distention. Post gallbladder surgery. Confirmed by leak with increased abdominal distension. EXAM: CT ABDOMEN AND PELVIS WITHOUT CONTRAST TECHNIQUE: Multidetector CT imaging of the abdomen and pelvis was performed following the standard protocol without IV contrast. COMPARISON:  Contrast-enhanced CT yesterday FINDINGS: Lower chest: Small pleural effusions have increased in size. Adjacent compressive atelectasis. Hepatobiliary: Decreased common bile ductal dilatation, currently 9 mm, previously 11 mm. The questioned filling defect in the distal common bile duct on prior exam is not confidently visualized. Coarse calcification in the right hepatic lobe again seen. Previous subcentimeter low-density lesion is not seen on the current exam. Pancreas: No ductal dilatation or inflammation. Spleen: Normal in size without focal abnormality. Adrenals/Urinary Tract: No adrenal nodule. No hydronephrosis. There is residual excreted contrast in both renal collecting systems in the urinary bladder from contrast enhanced CT yesterday. Bilateral renal parenchymal atrophy. Stomach/Bowel: Distended stomach with intraluminal fluid. Small bowel is decompressed. Diffuse colonic distension with air, liquid and formed stool. Colon is diffusely tortuous as before. Normal appendix. Vascular/Lymphatic: Dense aortic atherosclerosis. No bulky abdominopelvic adenopathy. Reproductive: Atrophic uterus is partially obscured. No obvious adnexal mass. Other: Increased free fluid in the abdomen and pelvis, patient with known bile leak. Greatest fluid volume in the right upper quadrant, and  dependently in the pelvis. There is increasing left lateral body wall edema as well as fluid tracking within the abdominal wall musculature. Increasing flank edema about the gluteal soft tissues. Patchy soft tissue gas in the anterior abdominal wall soft tissues. Musculoskeletal: There are no acute or suspicious osseous abnormalities. IMPRESSION: 1. Increased free fluid in the abdomen and pelvis, patient with known bile leak. Greatest fluid volume in the right upper quadrant, and dependently in the pelvis. 2. Decreased common bile ductal dilatation, currently 9 mm, previously 11 mm. The questioned filling defect in the distal common bile duct on prior exam is not confidently visualized. 3. Diffuse colonic distension with air, liquid and formed stool. Colon is diffusely tortuous as before. 4. Small pleural effusions have increased in size. Adjacent compressive atelectasis. Aortic Atherosclerosis (ICD10-I70.0). Electronically Signed   By: Keith Rake M.D.   On: 06/30/2020 20:54   DG Chest 1 View  Result Date: 06/29/2020 CLINICAL DATA:  Pleural effusion EXAM: CHEST  1 VIEW COMPARISON:  April 23, 2020 FINDINGS: The lung volumes are low. The heart size is unremarkable. There is vascular congestion with small bilateral pleural effusions. There are prominent interstitial lung markings. There is a linear opacity in the left lower lung zone favored to represent atelectasis. There is no acute osseous abnormality. The trachea is midline. IMPRESSION: Prominent interstitial lung markings with low lung volumes. Findings may be secondary to interstitial edema or an atypical infectious process. There are small bilateral pleural effusions. Electronically Signed   By: Constance Holster M.D.   On: 06/29/2020 18:45   CT Abdomen Pelvis W Contrast  Result Date: 06/29/2020 CLINICAL DATA:  Epigastric pain.  Increasing abdominal pain. EXAM: CT ABDOMEN AND PELVIS WITH CONTRAST TECHNIQUE: Multidetector CT imaging of the abdomen  and pelvis was performed using the standard protocol following bolus administration of intravenous contrast. CONTRAST:  27m OMNIPAQUE IOHEXOL 300 MG/ML  SOLN COMPARISON:  CT 04/22/2020, abdominal ultrasound 04/23/2020 FINDINGS: Lower chest: Hypoventilatory changes in the lung bases. There is a small right pleural effusion. Hepatobiliary: 4 mm hypodensity in the periphery of the right hepatic lobe is too small to characterize. Coarse calcification in the subcapsular right lobe is unchanged. The gallbladder is decompressed. No calcified gallstones. There is extrahepatic biliary ductal dilatation with common bile duct measuring 11 mm. There is a questionable intraluminal filling defect near the ampulla, series 2, image 41. Central intrahepatic biliary ductal dilatation. Pancreas: No ductal dilatation or inflammation. Spleen: Normal in size without focal abnormality. Adrenals/Urinary Tract: Punctate right adrenal calcifications. No nodule of either adrenal gland. Bilateral renal parenchymal atrophy in thinning of the renal parenchyma. Cortical scarring in the upper left kidney. Previous medullary calcinosis is partially obscured by IV contrast on the current exam. Calcification in the lower left kidney may be a nonobstructing stone or parenchymal. No hydronephrosis. There is absent renal excretion on delayed phase imaging. Stomach/Bowel: Bowel evaluation is limited in the absence of enteric contrast and paucity of intra-abdominal fat. Fluid distends the stomach. Question of gastric hyperemia and rugal fold thickening. Fluid within the proximal duodenum. Distal small bowel loops are fluid-filled and mildly prominent. More proximal small bowel are nondistended. No convincing small bowel inflammation. The appendix is visualized and normal. There is a large volume of stool in the ascending and transverse colon. Moderate stool in the descending colon. Proximal colon is diffusely tortuous. No colonic wall thickening. Sigmoid  colon effaces the obturator canals without frank hernia. Vascular/Lymphatic: Age advanced aortic and branch atherosclerosis. No aneurysm. Atherosclerosis of the proximal common iliac arteries with mild luminal narrowing. Patent portal vein. No portal venous or mesenteric gas. No bulky abdominopelvic adenopathy. Reproductive: Atrophic uterus.  No adnexal mass. Other: Small volume abdominopelvic ascites. Mild edema of the intra-abdominal mesenteric fat. There is subcutaneous edema, which is confluent in the flanks. Few foci of soft tissue gas in the anterior abdominal wall, typical of medication injection sites. No loculated subcutaneous collection. Musculoskeletal: Degenerative change at L4-L5. There are no acute or suspicious osseous abnormalities. IMPRESSION: 1. Intrahepatic and extrahepatic biliary ductal dilatation with questionable intraluminal filling defect near the ampulla. Recommend further correlation with LFTs. If elevated recommend evaluation with ERCP or MRCP. Gallbladder is decompressed. 2. Small volume abdominopelvic ascites. Small right pleural effusion. Body wall edema. Findings suggest with third-spacing. 3. Large volume of stool in the ascending and transverse colon with colonic tortuosity, can be seen with constipation. 4. Absent renal excretion on delayed phase imaging suggest underlying renal dysfunction. Mild bilateral renal atrophy. Nephrocalcinosis on prior noncontrast exam  is not as well appreciated. 5. Age advanced aortic atherosclerosis. Aortic Atherosclerosis (ICD10-I70.0). Electronically Signed   By: Keith Rake M.D.   On: 06/29/2020 17:14   DG Abd Portable 1V  Result Date: 07/02/2020 CLINICAL DATA:  Left-sided abdominal pain. EXAM: PORTABLE ABDOMEN - 1 VIEW COMPARISON:  CT scan June 30, 2020 FINDINGS: Biliary stents are identified in the right side of the abdomen. There is diffuse colonic distension remaining. No other acute abnormalities. IMPRESSION: 1. Two biliary stents  seen in the right side of the abdomen. 2. Diffuse colonic distension, similar since June 30, 2020. Electronically Signed   By: Dorise Bullion III M.D   On: 07/02/2020 11:49   DG C-Arm 1-60 Min-No Report  Result Date: 07/01/2020 Fluoroscopy was utilized by the requesting physician.  No radiographic interpretation.   NM HEPATOBILIARY LEAK (POST-SURGICAL)  Result Date: 06/30/2020 CLINICAL DATA:  Recurrent abdominal pain. Status post cholecystectomy several days ago. Evaluate for bile leak. EXAM: NUCLEAR MEDICINE HEPATOBILIARY IMAGING TECHNIQUE: Sequential images of the abdomen were obtained out to 60 minutes following intravenous administration of radiopharmaceutical. RADIOPHARMACEUTICALS:  4.6 mCi Tc-65m Choletec IV COMPARISON:  None. FINDINGS: Prompt uptake and biliary excretion of activity by the liver is seen. No gallbladder activity is seen, consistent with prior cholecystectomy. Excreted biliary activity shows large leak with accumulation in the right perihepatic and subphrenic spaces and right paracolic gutter, consistent with postop bile leak. IMPRESSION: Prior cholecystectomy, with large post-op bile leak. These results will be called to the ordering clinician or representative by the Radiologist Assistant, and communication documented in the PACS or [employer] Electronically Signed   By: JMarlaine HindM.D.   On: 06/30/2020 15:14    Microbiology: Recent Results (from the past 240 hour(s))  Urine Culture     Status: Abnormal   Collection Time: 06/29/20  4:02 PM   Specimen: Urine, Random  Result Value Ref Range Status   Specimen Description   Final    URINE, RANDOM [facility] [address] [place] [address]   Special Requests   Final    NONE [facility] [address], [place] [address]   Culture (A)  Final    >=100,000 COLONIES/mL KLEBSIELLA PNEUMONIAE Confirmed Extended Spectrum Beta-Lactamase Producer  (ESBL).  In bloodstream infections from ESBL organisms, carbapenems are preferred over piperacillin/tazobactam. They are shown to have a lower risk of mortality.    Report Status 07/02/2020 FINAL  Final   Organism ID, Bacteria KLEBSIELLA PNEUMONIAE (A)  Final      Susceptibility   Klebsiella pneumoniae - MIC*    AMPICILLIN >=32 RESISTANT Resistant     CEFAZOLIN >=64 RESISTANT Resistant     CEFEPIME 2 SENSITIVE Sensitive     CEFTRIAXONE >=64 RESISTANT Resistant     CIPROFLOXACIN >=4 RESISTANT Resistant     GENTAMICIN >=16 RESISTANT Resistant     IMIPENEM <=0.25 SENSITIVE Sensitive     NITROFURANTOIN 64 INTERMEDIATE Intermediate     TRIMETH/SULFA >=320 RESISTANT Resistant     AMPICILLIN/SULBACTAM >=32 RESISTANT Resistant     PIP/TAZO 8 SENSITIVE Sensitive     * >=100,000 COLONIES/mL KLEBSIELLA PNEUMONIAE  Resp Panel by RT-PCR (Flu A&B, Covid) Nasopharyngeal Swab     Status: None   Collection Time: 06/29/20  7:11 PM   Specimen: Nasopharyngeal Swab; Nasopharyngeal(NP) swabs in vial transport medium  Result Value Ref Range Status   SARS Coronavirus 2 by RT PCR NEGATIVE NEGATIVE Final  Comment: (NOTE) SARS-CoV-2 target nucleic acids are NOT DETECTED.  The SARS-CoV-2 RNA is generally detectable in upper respiratory specimens during the acute phase of infection. The lowest concentration of SARS-CoV-2 viral copies this assay can detect is 138 copies/mL. A negative result does not preclude SARS-Cov-2 infection and should not be used as the sole basis for treatment or other patient management decisions. A negative result may occur with  improper specimen collection/handling, submission of specimen other than nasopharyngeal swab, presence of viral mutation(s) within the areas targeted by this assay, and inadequate number of viral copies(<138 copies/mL). A negative result must be combined with clinical observations, patient history, and epidemiological information. The expected result is  Negative.  Fact Sheet for Patients:  EntrepreneurPulse.com.au  Fact Sheet for Healthcare Providers:  IncredibleEmployment.be  This test is no t yet approved or cleared by the [place] FDA and  has been authorized for detection and/or diagnosis of SARS-CoV-2 by FDA under an Emergency Use Authorization (EUA). This EUA will remain  in effect (meaning this test can be used) for the duration of the COVID-19 declaration under Section 564(b)(1) of the Act, 21 U.S.C.section 360bbb-3(b)(1), unless the authorization is terminated  or revoked sooner.       Influenza A by PCR NEGATIVE NEGATIVE Final   Influenza B by PCR NEGATIVE NEGATIVE Final    Comment: (NOTE) The Xpert Xpress SARS-CoV-2/FLU/RSV plus assay is intended as an aid in the diagnosis of influenza from Nasopharyngeal swab specimens and should not be used as a sole basis for treatment. Nasal washings and aspirates are unacceptable for Xpert Xpress SARS-CoV-2/FLU/RSV testing.  Fact Sheet for Patients: EntrepreneurPulse.com.au  Fact Sheet for Healthcare Providers: IncredibleEmployment.be  This test is not yet approved or cleared by the [place] FDA and has been authorized for detection and/or diagnosis of SARS-CoV-2 by FDA under an Emergency Use Authorization (EUA). This EUA will remain in effect (meaning this test can be used) for the duration of the COVID-19 declaration under Section 564(b)(1) of the Act, 21 U.S.C. section 360bbb-3(b)(1), unless the authorization is terminated or revoked.  Performed at [facility], [address] [place], [place] [address]   CULTURE, BLOOD (ROUTINE X 2) w Reflex to ID Panel     Status: None   Collection Time: 06/29/20 10:18 PM   Specimen: BLOOD  Result Value Ref Range Status   Specimen Description BLOOD BLOOD RIGHT ARM  Final   Special Requests   Final    BOTTLES DRAWN AEROBIC AND ANAEROBIC  Blood Culture results may not be optimal due to an inadequate volume of blood received in culture bottles   Culture   Final    NO GROWTH 5 DAYS Performed at [facility], [address] [place], [place] [address]    Report Status 07/04/2020 FINAL  Final  CULTURE, BLOOD (ROUTINE X 2) w Reflex to ID Panel     Status: None   Collection Time: 06/29/20 10:34 PM   Specimen: BLOOD  Result Value Ref Range Status   Specimen Description BLOOD BLOOD RIGHT ARM  Final   Special Requests   Final    BOTTLES DRAWN AEROBIC AND ANAEROBIC Blood Culture results may not be optimal due to an inadequate volume of blood received in culture bottles   Culture   Final    NO GROWTH 5 DAYS Performed at [facility], [address], [place], [place] [address]    Report Status 07/04/2020 FINAL  Final     Labs: CBC: Recent  Labs  Lab 07/02/20 0605 07/03/20 0529 07/04/20 0501 07/05/20 0431 07/06/20 0430  WBC 24.8* 17.6* 17.4* 14.0* 15.5*  NEUTROABS  --   --   --  8.1*  --   HGB 9.4* 8.7* 9.4* 8.3* 9.6*  HCT 29.3* 28.3* 29.0* 26.7* 31.2*  MCV 102.4* 104.0* 101.0* 101.5* 102.3*  PLT 443* 438* 444* 408* 123456*   Basic Metabolic Panel: Recent Labs  Lab 07/02/20 0605 07/02/20 1200 07/03/20 0529 07/04/20 0501 07/05/20 0431 07/06/20 0430  NA 136  --  133* 136 132* 133*  K 3.8  --  3.1* 3.4* 3.1* 4.4  CL 111  --  109 110 108 108  CO2 14*  --  16* 17* 16* 18*  GLUCOSE 90  --  84 78 75 71  BUN 35*  --  29* 23* 17 16  CREATININE 2.10*  --  1.83* 1.80* 1.68* 1.59*  CALCIUM 8.5*  --  8.7* 9.9 9.4 8.8*  MG  --  2.3  --   --   --   --   PHOS  --  3.7  --   --   --   --    Liver Function Tests: Recent Labs  Lab 06/29/20 1602 06/30/20 0447 07/02/20 0605 07/03/20 0529 07/05/20 0431  AST 37 '27 26 21 '$ 14*  ALT '9 8 11 11 9  '$ ALKPHOS 82 87 144* 130* 117  BILITOT 0.6 0.7 0.8 0.8 0.7  PROT 7.1 6.2* 5.9* 5.8* 5.1*  ALBUMIN 3.5 2.9* 2.6* 2.5* 2.1*   Recent Labs  Lab 06/29/20 1602   LIPASE 38   No results for input(s): AMMONIA in the last 168 hours. Cardiac Enzymes: No results for input(s): CKTOTAL, CKMB, CKMBINDEX, TROPONINI in the last 168 hours. BNP (last 3 results) Recent Labs    10/26/19 1309  BNP 230.0*   CBG: No results for input(s): GLUCAP in the last 168 hours.  Time spent: 35 minutes  Signed:  Val Riles  [facility]  07/06/2020 12:23 PM

## 2020-07-06 NOTE — Progress Notes (Signed)
Katherine Antigua, MD [address], [place], [place], [place], [address] [address], [place], [place], [place], [address] Phone: [phone]  Fax: [phone]   Subjective: Reports 3 BMs today and reports she feels back to baseline. No N/V   Objective: Exam: Vital signs in last 24 hours: Vitals:   07/05/20 2334 07/06/20 0444 07/06/20 0849 07/06/20 1222  BP: 126/80 (!) 141/78 (!) 155/86 (!) 153/90  Pulse: 98 (!) 103 98 (!) 106  Resp: '17 16 16 17  '$ Temp: 98.4 F (36.9 C) 99 F (37.2 C) 99 F (37.2 C) 99 F (37.2 C)  TempSrc: Oral Oral Oral Oral  SpO2: 100% 100% 100% 100%  Weight:      Height:       Weight change:   Intake/Output Summary (Last 24 hours) at 07/06/2020 1358 Last data filed at 07/06/2020 0014 Gross per 24 hour  Intake --  Output 1900 ml  Net -1900 ml    General: No acute distress, AAO x3 Abd: Soft, NT/ND, No HSM Skin: Warm, no rashes Neck: Supple, Trachea midline   Lab Results: Lab Results  Component Value Date   WBC 15.5 (H) 07/06/2020   HGB 9.6 (L) 07/06/2020   HCT 31.2 (L) 07/06/2020   MCV 102.3 (H) 07/06/2020   PLT 485 (H) 07/06/2020   Micro Results: Recent Results (from the past 240 hour(s))  Urine Culture     Status: Abnormal   Collection Time: 06/29/20  4:02 PM   Specimen: Urine, Random  Result Value Ref Range Status   Specimen Description   Final    URINE, RANDOM Performed at [facility], [address] [place], [place] [address]    Special Requests   Final    NONE Performed at [facility], [address] [place], [place] [address]    Culture (A)  Final    >=100,000 COLONIES/mL KLEBSIELLA PNEUMONIAE Confirmed Extended Spectrum Beta-Lactamase Producer (ESBL).  In bloodstream infections from ESBL organisms, carbapenems are preferred over piperacillin/tazobactam. They are shown to have a lower risk of mortality.    Report Status 07/02/2020 FINAL  Final   Organism ID, Bacteria KLEBSIELLA PNEUMONIAE  (A)  Final      Susceptibility   Klebsiella pneumoniae - MIC*    AMPICILLIN >=32 RESISTANT Resistant     CEFAZOLIN >=64 RESISTANT Resistant     CEFEPIME 2 SENSITIVE Sensitive     CEFTRIAXONE >=64 RESISTANT Resistant     CIPROFLOXACIN >=4 RESISTANT Resistant     GENTAMICIN >=16 RESISTANT Resistant     IMIPENEM <=0.25 SENSITIVE Sensitive     NITROFURANTOIN 64 INTERMEDIATE Intermediate     TRIMETH/SULFA >=320 RESISTANT Resistant     AMPICILLIN/SULBACTAM >=32 RESISTANT Resistant     PIP/TAZO 8 SENSITIVE Sensitive     * >=100,000 COLONIES/mL KLEBSIELLA PNEUMONIAE  Resp Panel by RT-PCR (Flu A&B, Covid) Nasopharyngeal Swab     Status: None   Collection Time: 06/29/20  7:11 PM   Specimen: Nasopharyngeal Swab; Nasopharyngeal(NP) swabs in vial transport medium  Result Value Ref Range Status   SARS Coronavirus 2 by RT PCR NEGATIVE NEGATIVE Final    Comment: (NOTE) SARS-CoV-2 target nucleic acids are NOT DETECTED.  The SARS-CoV-2 RNA is generally detectable in upper respiratory specimens during the acute phase of infection. The lowest concentration of SARS-CoV-2 viral copies this assay can detect is 138 copies/mL. A negative result does not preclude SARS-Cov-2 infection and should not be used as the sole basis for treatment or other patient management  decisions. A negative result may occur with  improper specimen collection/handling, submission of specimen other than nasopharyngeal swab, presence of viral mutation(s) within the areas targeted by this assay, and inadequate number of viral copies(<138 copies/mL). A negative result must be combined with clinical observations, patient history, and epidemiological information. The expected result is Negative.  Fact Sheet for Patients:  EntrepreneurPulse.com.au  Fact Sheet for Healthcare Providers:  IncredibleEmployment.be  This test is no t yet approved or cleared by the [place] FDA and  has been  authorized for detection and/or diagnosis of SARS-CoV-2 by FDA under an Emergency Use Authorization (EUA). This EUA will remain  in effect (meaning this test can be used) for the duration of the COVID-19 declaration under Section 564(b)(1) of the Act, 21 U.S.C.section 360bbb-3(b)(1), unless the authorization is terminated  or revoked sooner.       Influenza A by PCR NEGATIVE NEGATIVE Final   Influenza B by PCR NEGATIVE NEGATIVE Final    Comment: (NOTE) The Xpert Xpress SARS-CoV-2/FLU/RSV plus assay is intended as an aid in the diagnosis of influenza from Nasopharyngeal swab specimens and should not be used as a sole basis for treatment. Nasal washings and aspirates are unacceptable for Xpert Xpress SARS-CoV-2/FLU/RSV testing.  Fact Sheet for Patients: EntrepreneurPulse.com.au  Fact Sheet for Healthcare Providers: IncredibleEmployment.be  This test is not yet approved or cleared by the [place] FDA and has been authorized for detection and/or diagnosis of SARS-CoV-2 by FDA under an Emergency Use Authorization (EUA). This EUA will remain in effect (meaning this test can be used) for the duration of the COVID-19 declaration under Section 564(b)(1) of the Act, 21 U.S.C. section 360bbb-3(b)(1), unless the authorization is terminated or revoked.  Performed at [facility], [address] [place], [place] [address]   CULTURE, BLOOD (ROUTINE X 2) w Reflex to ID Panel     Status: None   Collection Time: 06/29/20 10:18 PM   Specimen: BLOOD  Result Value Ref Range Status   Specimen Description BLOOD BLOOD RIGHT ARM  Final   Special Requests   Final    BOTTLES DRAWN AEROBIC AND ANAEROBIC Blood Culture results may not be optimal due to an inadequate volume of blood received in culture bottles   Culture   Final    NO GROWTH 5 DAYS Performed at [facility], [address] [place], [place] [address]    Report Status  07/04/2020 FINAL  Final  CULTURE, BLOOD (ROUTINE X 2) w Reflex to ID Panel     Status: None   Collection Time: 06/29/20 10:34 PM   Specimen: BLOOD  Result Value Ref Range Status   Specimen Description BLOOD BLOOD RIGHT ARM  Final   Special Requests   Final    BOTTLES DRAWN AEROBIC AND ANAEROBIC Blood Culture results may not be optimal due to an inadequate volume of blood received in culture bottles   Culture   Final    NO GROWTH 5 DAYS Performed at [facility] [address], [place], [place] [address]    Report Status 07/04/2020 FINAL  Final   Studies/Results: No results found. Medications:  Scheduled Meds: . amitriptyline  25 mg Oral QHS  . bisacodyl  45 mg Oral BID  . docusate sodium  100 mg Oral BID  . enoxaparin (LOVENOX) injection  40 mg Subcutaneous Q24H  . famotidine  40 mg Oral QHS  . fluticasone  2 spray Each Nare BID  . folic acid  1 mg Oral Daily  .  gabapentin  300 mg Oral BID  . metoCLOPramide  10 mg Oral BID  . mometasone-formoterol  2 puff Inhalation BID  . nicotine  14 mg Transdermal Daily  . oxybutynin  10 mg Oral Q1200  . pantoprazole  40 mg Oral Daily  . potassium chloride  40 mEq Oral BID  . senna-docusate  1 tablet Oral Daily  . sodium citrate-citric acid  15 mL Oral BID  . sodium phosphate  1 enema Rectal Once  . tamsulosin  0.4 mg Oral QHS  . theophylline  400 mg Oral BID  . thiamine injection  100 mg Intravenous Daily   Continuous Infusions: . meropenem (MERREM) IV 1 g (07/06/20 0922)   PRN Meds:.albuterol, cetaphil, diphenhydrAMINE, HYDROcodone-acetaminophen, iohexol, morphine injection, prochlorperazine, promethazine   Assessment: Active Problems:   Leukocytosis   GERD without esophagitis   CKD (chronic kidney disease) stage 3, GFR 30-59 ml/min (HCC)   Acalculous cholecystitis   Bile leak   Other specified diseases of biliary tract   Disease of biliary tract, unspecified    Plan: Abdominal examination shows improved  distension today  Pt states she follows with [facility] in [place] and will follow up with them after discharge as well  Pt encouraged to call our clinic if she needs a GI appt and unable to see primary GI soon after discharge  Further plans as per surgery team    LOS: 6 days   Katherine Antigua, MD 07/06/2020, 1:58 PM

## 2020-07-06 NOTE — Progress Notes (Signed)
Patient ID: Katherine Perez, female   DOB: 10/15/1970, 50 y.o.   MRN: [id number]     [facility] Day(s): 6.   Post op day(s): 5 Days Post-Op.   Interval History: Patient seen and examined, no acute events or new complaints overnight. Patient reports feeling great. She reports abdomen is softer and having bowel movements.   Vital signs in last 24 hours: [min-max] current  Temp:  [97.8 F (36.6 C)-99.1 F (37.3 C)] 99 F (37.2 C) (03/16 0444) Pulse Rate:  [98-103] 103 (03/16 0444) Resp:  [16-18] 16 (03/16 0444) BP: (126-156)/(78-87) 141/78 (03/16 0444) SpO2:  [100 %] 100 % (03/16 0444)     Height: '5\' 9"'$  (175.3 cm) Weight: 59 kg BMI (Calculated): 19.19   Physical Exam:  Constitutional: alert, cooperative and no distress  Respiratory: breathing non-labored at rest  Cardiovascular: regular rate and sinus rhythm  Gastrointestinal: soft, non-tender, and non-distended  Labs:  CBC Latest Ref Rng & Units 07/06/2020 07/05/2020 07/04/2020  WBC 4.0 - 10.5 K/uL 15.5(H) 14.0(H) 17.4(H)  Hemoglobin 12.0 - 15.0 g/dL 9.6(L) 8.3(L) 9.4(L)  Hematocrit 36.0 - 46.0 % 31.2(L) 26.7(L) 29.0(L)  Platelets 150 - 400 K/uL 485(H) 408(H) 444(H)   CMP Latest Ref Rng & Units 07/06/2020 07/05/2020 07/04/2020  Glucose 70 - 99 mg/dL 71 75 78  BUN 6 - 20 mg/dL 16 17 23(H)  Creatinine 0.44 - 1.00 mg/dL 1.59(H) 1.68(H) 1.80(H)  Sodium 135 - 145 mmol/L 133(L) 132(L) 136  Potassium 3.5 - 5.1 mmol/L 4.4 3.1(L) 3.4(L)  Chloride 98 - 111 mmol/L 108 108 110  CO2 22 - 32 mmol/L 18(L) 16(L) 17(L)  Calcium 8.9 - 10.3 mg/dL 8.8(L) 9.4 9.9  Total Protein 6.5 - 8.1 g/dL - 5.1(L) -  Total Bilirubin 0.3 - 1.2 mg/dL - 0.7 -  Alkaline Phos 38 - 126 U/L - 117 -  AST 15 - 41 U/L - 14(L) -  ALT 0 - 44 U/L - 9 -    Imaging studies: No new pertinent imaging studies   Assessment/Plan:  50 y.o.femalewithbile leak5Day Post-Ops/p ERCP with stent placement.  Bile leak: Patient treated with ERCP and stent  placement. Doing well. No sign of complication from the procedure.White blood cell now at her baseline. CT did not shows any fluid collection from the bile leak, no biloma. No further imaging or work-up for her bile leak.  Constipation: Patient with chronic constipation. Her constipation is so severe that her gastroenterologist recommended total colectomy.She has had 2 enemas during the admission that has decreased to solid stool in the large intestine as per CT scan but she continue with significant dilation with gas. Recommend to continue follow-up of electrolytes to make sure that there is no disturbance that needs to be addressed. No significant fluid collection as the cause of this problem. Patient responded well to magnesium citrate. Today abdomen is soft and non tender.   No further surgical management. From surgical standpoint, patient can be discharged if medically stable. She has a follow up appointment with me in a week and should follow with GI for stent removal in 3 months.   Arnold Long, MD

## 2020-07-06 NOTE — Progress Notes (Signed)
Pt discharged home with her husband.  VSS at the time of discharge and pt without complaints.  Discharged instructions reviewed with pt and pt verbalizes understanding.   Pt discharged via wheelchair via [facility].

## 2020-07-06 NOTE — Plan of Care (Signed)

## 2020-07-15 LAB — VITAMIN B1: Vitamin B1 (Thiamine): 151.5 nmol/L (ref 66.5–200.0)

## 2020-08-08 ENCOUNTER — Emergency Department: Payer: BC Managed Care – PPO

## 2020-08-08 ENCOUNTER — Other Ambulatory Visit: Payer: Self-pay

## 2020-08-08 ENCOUNTER — Inpatient Hospital Stay
Admission: EM | Admit: 2020-08-08 | Discharge: 2020-08-12 | DRG: 682 | Disposition: A | Discharge status: Home / Self Care | Payer: BC Managed Care – PPO | Attending: Internal Medicine | Admitting: Internal Medicine

## 2020-08-08 ENCOUNTER — Encounter: Payer: Self-pay | Admitting: Intensive Care

## 2020-08-08 DIAGNOSIS — E861 Hypovolemia: Secondary | ICD-10-CM | POA: Diagnosis present

## 2020-08-08 DIAGNOSIS — K529 Noninfective gastroenteritis and colitis, unspecified: Secondary | ICD-10-CM | POA: Diagnosis present

## 2020-08-08 DIAGNOSIS — F509 Eating disorder, unspecified: Secondary | ICD-10-CM | POA: Diagnosis present

## 2020-08-08 DIAGNOSIS — E875 Hyperkalemia: Secondary | ICD-10-CM | POA: Diagnosis present

## 2020-08-08 DIAGNOSIS — E876 Hypokalemia: Secondary | ICD-10-CM | POA: Diagnosis present

## 2020-08-08 DIAGNOSIS — I7300 Raynaud's syndrome without gangrene: Secondary | ICD-10-CM | POA: Diagnosis present

## 2020-08-08 DIAGNOSIS — K219 Gastro-esophageal reflux disease without esophagitis: Secondary | ICD-10-CM | POA: Diagnosis present

## 2020-08-08 DIAGNOSIS — F17210 Nicotine dependence, cigarettes, uncomplicated: Secondary | ICD-10-CM | POA: Diagnosis present

## 2020-08-08 DIAGNOSIS — G629 Polyneuropathy, unspecified: Secondary | ICD-10-CM | POA: Diagnosis present

## 2020-08-08 DIAGNOSIS — G4700 Insomnia, unspecified: Secondary | ICD-10-CM | POA: Diagnosis present

## 2020-08-08 DIAGNOSIS — N179 Acute kidney failure, unspecified: Principal | ICD-10-CM | POA: Diagnosis present

## 2020-08-08 DIAGNOSIS — N289 Disorder of kidney and ureter, unspecified: Secondary | ICD-10-CM

## 2020-08-08 DIAGNOSIS — E871 Hypo-osmolality and hyponatremia: Secondary | ICD-10-CM | POA: Diagnosis present

## 2020-08-08 DIAGNOSIS — Z20822 Contact with and (suspected) exposure to covid-19: Secondary | ICD-10-CM | POA: Diagnosis present

## 2020-08-08 DIAGNOSIS — E872 Acidosis, unspecified: Secondary | ICD-10-CM | POA: Diagnosis present

## 2020-08-08 DIAGNOSIS — Z79899 Other long term (current) drug therapy: Secondary | ICD-10-CM

## 2020-08-08 DIAGNOSIS — R4781 Slurred speech: Secondary | ICD-10-CM | POA: Diagnosis present

## 2020-08-08 DIAGNOSIS — G9341 Metabolic encephalopathy: Secondary | ICD-10-CM | POA: Diagnosis present

## 2020-08-08 DIAGNOSIS — E860 Dehydration: Secondary | ICD-10-CM | POA: Diagnosis present

## 2020-08-08 DIAGNOSIS — R7989 Other specified abnormal findings of blood chemistry: Secondary | ICD-10-CM | POA: Diagnosis present

## 2020-08-08 DIAGNOSIS — N184 Chronic kidney disease, stage 4 (severe): Secondary | ICD-10-CM | POA: Diagnosis present

## 2020-08-08 DIAGNOSIS — Z87440 Personal history of urinary (tract) infections: Secondary | ICD-10-CM

## 2020-08-08 DIAGNOSIS — J45909 Unspecified asthma, uncomplicated: Secondary | ICD-10-CM | POA: Diagnosis present

## 2020-08-08 DIAGNOSIS — R531 Weakness: Secondary | ICD-10-CM

## 2020-08-08 DIAGNOSIS — Z8659 Personal history of other mental and behavioral disorders: Secondary | ICD-10-CM

## 2020-08-08 DIAGNOSIS — K5909 Other constipation: Secondary | ICD-10-CM | POA: Diagnosis present

## 2020-08-08 DIAGNOSIS — F552 Abuse of laxatives: Secondary | ICD-10-CM | POA: Diagnosis present

## 2020-08-08 DIAGNOSIS — Z888 Allergy status to other drugs, medicaments and biological substances status: Secondary | ICD-10-CM

## 2020-08-08 DIAGNOSIS — Z681 Body mass index (BMI) 19 or less, adult: Secondary | ICD-10-CM | POA: Diagnosis not present

## 2020-08-08 DIAGNOSIS — D638 Anemia in other chronic diseases classified elsewhere: Secondary | ICD-10-CM | POA: Diagnosis present

## 2020-08-08 LAB — COMPREHENSIVE METABOLIC PANEL
ALT: 11 U/L (ref 0–44)
AST: 12 U/L — ABNORMAL LOW (ref 15–41)
Albumin: 3.8 g/dL (ref 3.5–5.0)
Alkaline Phosphatase: 128 U/L — ABNORMAL HIGH (ref 38–126)
Anion gap: 18 — ABNORMAL HIGH (ref 5–15)
BUN: 94 mg/dL — ABNORMAL HIGH (ref 6–20)
CO2: 9 mmol/L — ABNORMAL LOW (ref 22–32)
Calcium: 10.9 mg/dL — ABNORMAL HIGH (ref 8.9–10.3)
Chloride: 100 mmol/L (ref 98–111)
Creatinine, Ser: 3.59 mg/dL — ABNORMAL HIGH (ref 0.44–1.00)
GFR, Estimated: 15 mL/min — ABNORMAL LOW (ref >60)
Glucose, Bld: 97 mg/dL (ref 70–99)
Potassium: 2.3 mmol/L — CL (ref 3.5–5.1)
Sodium: 127 mmol/L — ABNORMAL LOW (ref 135–145)
Total Bilirubin: 0.7 mg/dL (ref 0.3–1.2)
Total Protein: 7.2 g/dL (ref 6.5–8.1)

## 2020-08-08 LAB — BLOOD GAS, VENOUS
Acid-base deficit: 23.2 mmol/L — ABNORMAL HIGH (ref 0.0–2.0)
Bicarbonate: 6.1 mmol/L — ABNORMAL LOW (ref 20.0–28.0)
O2 Saturation: 71.6 %
Patient temperature: 37
pCO2, Ven: 23 mmHg — ABNORMAL LOW (ref 44.0–60.0)
pH, Ven: 7.03 — CL (ref 7.250–7.430)
pO2, Ven: 57 mmHg — ABNORMAL HIGH (ref 32.0–45.0)

## 2020-08-08 LAB — CBC WITH DIFFERENTIAL/PLATELET
Abs Immature Granulocytes: 0.22 10*3/uL — ABNORMAL HIGH (ref 0.00–0.07)
Basophils Absolute: 0.2 10*3/uL — ABNORMAL HIGH (ref 0.0–0.1)
Basophils Relative: 1 %
Eosinophils Absolute: 1.6 10*3/uL — ABNORMAL HIGH (ref 0.0–0.5)
Eosinophils Relative: 9 %
HCT: 34.7 % — ABNORMAL LOW (ref 36.0–46.0)
Hemoglobin: 12 g/dL (ref 12.0–15.0)
Immature Granulocytes: 1 %
Lymphocytes Relative: 10 %
Lymphs Abs: 1.8 10*3/uL (ref 0.7–4.0)
MCH: 31.1 pg (ref 26.0–34.0)
MCHC: 34.6 g/dL (ref 30.0–36.0)
MCV: 89.9 fL (ref 80.0–100.0)
Monocytes Absolute: 1.1 10*3/uL — ABNORMAL HIGH (ref 0.1–1.0)
Monocytes Relative: 6 %
Neutro Abs: 12.6 10*3/uL — ABNORMAL HIGH (ref 1.7–7.7)
Neutrophils Relative %: 73 %
Platelets: 473 10*3/uL — ABNORMAL HIGH (ref 150–400)
RBC: 3.86 MIL/uL — ABNORMAL LOW (ref 3.87–5.11)
RDW: 14.5 % (ref 11.5–15.5)
WBC: 17.4 10*3/uL — ABNORMAL HIGH (ref 4.0–10.5)
nRBC: 0 % (ref 0.0–0.2)

## 2020-08-08 LAB — BASIC METABOLIC PANEL
Anion gap: 12 (ref 5–15)
BUN: 96 mg/dL — ABNORMAL HIGH (ref 6–20)
CO2: 8 mmol/L — ABNORMAL LOW (ref 22–32)
Calcium: 9.6 mg/dL (ref 8.9–10.3)
Chloride: 110 mmol/L (ref 98–111)
Creatinine, Ser: 3.39 mg/dL — ABNORMAL HIGH (ref 0.44–1.00)
GFR, Estimated: 16 mL/min — ABNORMAL LOW (ref >60)
Glucose, Bld: 89 mg/dL (ref 70–99)
Potassium: 2.4 mmol/L — CL (ref 3.5–5.1)
Sodium: 130 mmol/L — ABNORMAL LOW (ref 135–145)

## 2020-08-08 LAB — RESP PANEL BY RT-PCR (FLU A&B, COVID) ARPGX2
Influenza A by PCR: NEGATIVE
Influenza B by PCR: NEGATIVE
SARS Coronavirus 2 by RT PCR: NEGATIVE

## 2020-08-08 LAB — LIPASE, BLOOD: Lipase: 62 U/L — ABNORMAL HIGH (ref 11–51)

## 2020-08-08 LAB — ETHANOL: Alcohol, Ethyl (B): <10 mg/dL (ref <10)

## 2020-08-08 LAB — TROPONIN I (HIGH SENSITIVITY)
Troponin I (High Sensitivity): 25 ng/L — ABNORMAL HIGH (ref <18)
Troponin I (High Sensitivity): 25 ng/L — ABNORMAL HIGH (ref <18)

## 2020-08-08 LAB — TSH: TSH: 3.33 u[IU]/mL (ref 0.350–4.500)

## 2020-08-08 LAB — LACTIC ACID, PLASMA: Lactic Acid, Venous: 0.6 mmol/L (ref 0.5–1.9)

## 2020-08-08 LAB — AMMONIA: Ammonia: 18 umol/L (ref 9–35)

## 2020-08-08 MED ORDER — ASPIRIN EC 81 MG PO TBEC
81.0000 mg | DELAYED_RELEASE_TABLET | Freq: Every day | ORAL | Status: DC
  Administered 2020-08-09 – 2020-08-12 (×4): 81 mg via ORAL
  Filled 2020-08-08 (×4): qty 1

## 2020-08-08 MED ORDER — PLECANATIDE 3 MG PO TABS: 3.0000 mg | ORAL_TABLET | Freq: Every day | ORAL | Status: DC

## 2020-08-08 MED ORDER — POLYETHYLENE GLYCOL 3350 17 G PO PACK
17.0000 g | PACK | Freq: Every day | ORAL | Status: DC
  Filled 2020-08-08: qty 1

## 2020-08-08 MED ORDER — TAMSULOSIN HCL 0.4 MG PO CAPS
0.4000 mg | ORAL_CAPSULE | Freq: Every day | ORAL | Status: DC
  Administered 2020-08-09 – 2020-08-11 (×3): 0.4 mg via ORAL
  Filled 2020-08-08 (×3): qty 1

## 2020-08-08 MED ORDER — AMITRIPTYLINE HCL 25 MG PO TABS
25.0000 mg | ORAL_TABLET | Freq: Every day | ORAL | Status: DC
  Administered 2020-08-09 – 2020-08-11 (×3): 25 mg via ORAL
  Filled 2020-08-08 (×3): qty 1

## 2020-08-08 MED ORDER — FLUTICASONE PROPIONATE 50 MCG/ACT NA SUSP
2.0000 | Freq: Two times a day (BID) | NASAL | Status: DC
  Administered 2020-08-09 – 2020-08-12 (×7): 2 via NASAL
  Filled 2020-08-08: qty 16

## 2020-08-08 MED ORDER — BISACODYL 5 MG PO TBEC: 10.0000 mg | DELAYED_RELEASE_TABLET | Freq: Every evening | ORAL | Status: DC | PRN

## 2020-08-08 MED ORDER — PANTOPRAZOLE SODIUM 40 MG PO TBEC
40.0000 mg | DELAYED_RELEASE_TABLET | Freq: Every day | ORAL | Status: DC
  Administered 2020-08-09 – 2020-08-12 (×4): 40 mg via ORAL
  Filled 2020-08-08 (×4): qty 1

## 2020-08-08 MED ORDER — FAMOTIDINE 20 MG PO TABS
40.0000 mg | ORAL_TABLET | Freq: Every day | ORAL | Status: DC
  Administered 2020-08-09: 40 mg via ORAL
  Filled 2020-08-08: qty 2

## 2020-08-08 MED ORDER — SOD CITRATE-CITRIC ACID 500-334 MG/5ML PO SOLN
15.0000 mL | Freq: Two times a day (BID) | ORAL | Status: DC
  Administered 2020-08-09 – 2020-08-12 (×4): 15 mL via ORAL
  Filled 2020-08-08 (×8): qty 15

## 2020-08-08 MED ORDER — POTASSIUM CHLORIDE 10 MEQ/100ML IV SOLN
10.0000 meq | Freq: Once | INTRAVENOUS | Status: AC
  Administered 2020-08-08: 10 meq via INTRAVENOUS
  Filled 2020-08-08: qty 100

## 2020-08-08 MED ORDER — SODIUM BICARBONATE 8.4 % IV SOLN
INTRAVENOUS | Status: DC
  Filled 2020-08-08 (×6): qty 1000
  Filled 2020-08-08: qty 150
  Filled 2020-08-08 (×4): qty 1000
  Filled 2020-08-08: qty 150
  Filled 2020-08-08 (×4): qty 1000
  Filled 2020-08-08: qty 150
  Filled 2020-08-08 (×6): qty 1000

## 2020-08-08 MED ORDER — POTASSIUM CHLORIDE 10 MEQ/100ML IV SOLN
10.0000 meq | INTRAVENOUS | Status: AC
  Administered 2020-08-08 – 2020-08-09 (×3): 10 meq via INTRAVENOUS
  Filled 2020-08-08 (×3): qty 100

## 2020-08-08 MED ORDER — TRAMADOL HCL 50 MG PO TABS
50.0000 mg | ORAL_TABLET | Freq: Four times a day (QID) | ORAL | Status: DC | PRN
  Administered 2020-08-09 – 2020-08-11 (×3): 50 mg via ORAL
  Filled 2020-08-08 (×3): qty 1

## 2020-08-08 MED ORDER — SODIUM CHLORIDE 0.9 % IV BOLUS
1000.0000 mL | Freq: Once | INTRAVENOUS | Status: AC
  Administered 2020-08-08: 1000 mL via INTRAVENOUS

## 2020-08-08 MED ORDER — BRIMONIDINE TARTRATE 0.025 % OP SOLN: 1.0000 [drp] | Freq: Every day | OPHTHALMIC | Status: DC

## 2020-08-08 MED ORDER — POTASSIUM CHLORIDE CRYS ER 20 MEQ PO TBCR
40.0000 meq | EXTENDED_RELEASE_TABLET | Freq: Once | ORAL | Status: AC
  Administered 2020-08-08: 40 meq via ORAL
  Filled 2020-08-08: qty 2

## 2020-08-08 MED ORDER — ADULT MULTIVITAMIN W/MINERALS CH
1.0000 | ORAL_TABLET | Freq: Every day | ORAL | Status: DC
  Administered 2020-08-09 – 2020-08-12 (×4): 1 via ORAL
  Filled 2020-08-08 (×4): qty 1

## 2020-08-08 MED ORDER — ALBUTEROL SULFATE HFA 108 (90 BASE) MCG/ACT IN AERS
2.0000 | INHALATION_SPRAY | RESPIRATORY_TRACT | Status: DC | PRN
  Administered 2020-08-10 – 2020-08-12 (×3): 2 via RESPIRATORY_TRACT
  Filled 2020-08-08 (×2): qty 6.7

## 2020-08-08 MED ORDER — ENOXAPARIN SODIUM 30 MG/0.3ML ~~LOC~~ SOLN
30.0000 mg | SUBCUTANEOUS | Status: DC
  Administered 2020-08-09 – 2020-08-12 (×4): 30 mg via SUBCUTANEOUS
  Filled 2020-08-08 (×3): qty 0.3

## 2020-08-08 MED ORDER — OXYBUTYNIN CHLORIDE ER 10 MG PO TB24
10.0000 mg | ORAL_TABLET | Freq: Every day | ORAL | Status: DC
  Administered 2020-08-09 – 2020-08-11 (×3): 10 mg via ORAL
  Filled 2020-08-08 (×4): qty 1

## 2020-08-08 MED ORDER — GABAPENTIN 300 MG PO CAPS: 300.0000 mg | ORAL_CAPSULE | Freq: Three times a day (TID) | ORAL | Status: DC

## 2020-08-08 MED ORDER — PENTOSAN POLYSULFATE SODIUM 100 MG PO CAPS
100.0000 mg | ORAL_CAPSULE | Freq: Every day | ORAL | Status: DC
  Administered 2020-08-09 – 2020-08-11 (×3): 100 mg via ORAL
  Filled 2020-08-08 (×6): qty 1

## 2020-08-08 NOTE — ED Notes (Signed)
Potassium 2.3Kerman Passey, MD aware.

## 2020-08-08 NOTE — ED Triage Notes (Addendum)
Patient c/o abdominal pain with 1 episode of emesis. No appetite. Husband wanted patients electrolytes checked. He reports she is having slurred speech which is normal when her electrolytes are off and "tired." Recently had gallbladder removal and then stent placed in March. Patient uses walker baseline and unsteady on feet

## 2020-08-08 NOTE — ED Notes (Addendum)
Message sent to pharmacy to get patient's sodium bicarbonate sent to ED.

## 2020-08-08 NOTE — ED Notes (Signed)
Called pharmacy at this this time for missing sodium bicarb gtt. They stated they will send now.

## 2020-08-08 NOTE — ED Notes (Signed)
Patient at imaging at this time.

## 2020-08-08 NOTE — H&P (Signed)
Chief Complaint: Brought in by husband on account of worsening generalized weakness and altered mental status.  HPI:  Katherine Perez is an 50 y.o. female with medical history significant for eating disorder/laxative abuse, chronic kidney disease stage IIIa(baseline Scr of 1.5), recurrent UTIrecurrent UTI, incompletebladder emptying, anxiety disorder, insomnia, neuropathy and history of status recent ERCP and stent placement in 06/2020.  She presented to the emergency department accompanied by her husband with complaints of generalized weakness over the past 4 days.  As per patient, she has poor appetite, and has been unable to eat or drink any fluids over the past few days.  Due to worsening generalized weakness and new onset slurred speech and altered mental status, husband decided to bring patient to the emergency room to be further evaluated.  Patient denied any abdominal pain or discomfort.  Denied any dysuria or increased frequency of micturition.  Her last bowel movement was 4 days ago.  Work-up in the ED was significant for multiple electrolyte abnormalities including hyponatremia and hypokalemia.  She was also found to have acute metabolic acidosis.  Patient was initiated on IV fluids for dehydration.  I have advised for initiation of bicarb drip due to severe metabolic acidosis.   Past Medical History:  Diagnosis Date  . Anemia   . Asthma   . CKD (chronic kidney disease)   . ETOH abuse   . GERD (gastroesophageal reflux disease)   . IC (interstitial cystitis)   . Laxative abuse   . Neuropathy   . Pneumonia   . Raynaud disease     Past Surgical History:  Procedure Laterality Date  . COLONOSCOPY    . ERCP N/A 07/01/2020   Procedure: ENDOSCOPIC RETROGRADE CHOLANGIOPANCREATOGRAPHY (ERCP);  Surgeon: Lucilla Lame, MD;  Location: [facility] ENDOSCOPY;  Service: Endoscopy;  Laterality: N/A;  . ESOPHAGOGASTRODUODENOSCOPY    . FRACTURE SURGERY     femur fracture left - 2019  . LEG SURGERY       Family History  Problem Relation Age of Onset  . Other Neg Hx    Social History:  reports that she has been smoking cigarettes. She has never used smokeless tobacco. She reports current alcohol use. She reports current drug use. Drug: Marijuana.  Allergies:  Allergies  Allergen Reactions  . Cetirizine Hives  . Diazepam Other (See Comments)    Depression   . Baclofen Anxiety and Other (See Comments)    AMS - "Really messed me up, caused me to drop things"     (Not in a hospital admission)   Results for orders placed or performed during the hospital encounter of 08/08/20 (from the past 48 hour(s))  CBC with Differential     Status: Abnormal   Collection Time: 08/08/20  6:16 PM  Result Value Ref Range   WBC 17.4 (H) 4.0 - 10.5 K/uL   RBC 3.86 (L) 3.87 - 5.11 MIL/uL   Hemoglobin 12.0 12.0 - 15.0 g/dL   HCT 34.7 (L) 36.0 - 46.0 %   MCV 89.9 80.0 - 100.0 fL   MCH 31.1 26.0 - 34.0 pg   MCHC 34.6 30.0 - 36.0 g/dL   RDW 14.5 11.5 - 15.5 %   Platelets 473 (H) 150 - 400 K/uL   nRBC 0.0 0.0 - 0.2 %   Neutrophils Relative % 73 %   Neutro Abs 12.6 (H) 1.7 - 7.7 K/uL   Lymphocytes Relative 10 %   Lymphs Abs 1.8 0.7 - 4.0 K/uL   Monocytes Relative 6 %  Monocytes Absolute 1.1 (H) 0.1 - 1.0 K/uL   Eosinophils Relative 9 %   Eosinophils Absolute 1.6 (H) 0.0 - 0.5 K/uL   Basophils Relative 1 %   Basophils Absolute 0.2 (H) 0.0 - 0.1 K/uL   Immature Granulocytes 1 %   Abs Immature Granulocytes 0.22 (H) 0.00 - 0.07 K/uL    Comment: Performed at [facility], [address] [place], [place] [address]  Comprehensive metabolic panel     Status: Abnormal   Collection Time: 08/08/20  6:16 PM  Result Value Ref Range   Sodium 127 (L) 135 - 145 mmol/L   Potassium 2.3 (LL) 3.5 - 5.1 mmol/L    Comment: CRITICAL RESULT CALLED TO, READ BACK BY AND VERIFIED WITH LINDSEY BLACK '@1920'$  08/08/20 MJU    Chloride 100 98 - 111 mmol/L   CO2 9 (L) 22 - 32 mmol/L   Glucose, Bld 97 70 - 99  mg/dL    Comment: Glucose reference range applies only to samples taken after fasting for at least 8 hours.   BUN 94 (H) 6 - 20 mg/dL    Comment: RESULT CONFIRMED BY MANUAL DILUTION MJU   Creatinine, Ser 3.59 (H) 0.44 - 1.00 mg/dL   Calcium 10.9 (H) 8.9 - 10.3 mg/dL   Total Protein 7.2 6.5 - 8.1 g/dL   Albumin 3.8 3.5 - 5.0 g/dL   AST 12 (L) 15 - 41 U/L   ALT 11 0 - 44 U/L   Alkaline Phosphatase 128 (H) 38 - 126 U/L   Total Bilirubin 0.7 0.3 - 1.2 mg/dL   GFR, Estimated 15 (L) >60 mL/min    Comment: (NOTE) Calculated using the CKD-EPI Creatinine Equation (2021)    Anion gap 18 (H) 5 - 15    Comment: Performed at [facility], [address] [place], [place] [address]  Ammonia     Status: None   Collection Time: 08/08/20  6:16 PM  Result Value Ref Range   Ammonia 18 9 - 35 umol/L    Comment: Performed at [facility], [address] [place], [place] [address]  Lipase, blood     Status: Abnormal   Collection Time: 08/08/20  6:16 PM  Result Value Ref Range   Lipase 62 (H) 11 - 51 U/L    Comment: Performed at [facility], [place], [place] [address]  Troponin I (High Sensitivity)     Status: Abnormal   Collection Time: 08/08/20  6:16 PM  Result Value Ref Range   Troponin I (High Sensitivity) 25 (H) <18 ng/L    Comment: (NOTE) Elevated high sensitivity troponin I (hsTnI) values and significant  changes across serial measurements may suggest ACS but many other  chronic and acute conditions are known to elevate hsTnI results.  Refer to the "Links" section for chest pain algorithms and additional  guidance. Performed at [facility], [address] [place], [place] [address]   Ethanol     Status: None   Collection Time: 08/08/20  7:00 PM  Result Value Ref Range   Alcohol, Ethyl (B) <10 <10 mg/dL    Comment: (NOTE) Lowest detectable limit for serum alcohol is 10 mg/dL.  For medical purposes only. Performed at [facility], [address] [place],  [address]   Resp Panel by RT-PCR (Flu A&B, Covid) Nasopharyngeal Swab     Status: None   Collection Time: 08/08/20  7:33 PM   Specimen: Nasopharyngeal Swab; Nasopharyngeal(NP) swabs in  vial transport medium  Result Value Ref Range   SARS Coronavirus 2 by RT PCR NEGATIVE NEGATIVE    Comment: (NOTE) SARS-CoV-2 target nucleic acids are NOT DETECTED.  The SARS-CoV-2 RNA is generally detectable in upper respiratory specimens during the acute phase of infection. The lowest concentration of SARS-CoV-2 viral copies this assay can detect is 138 copies/mL. A negative result does not preclude SARS-Cov-2 infection and should not be used as the sole basis for treatment or other patient management decisions. A negative result may occur with  improper specimen collection/handling, submission of specimen other than nasopharyngeal swab, presence of viral mutation(s) within the areas targeted by this assay, and inadequate number of viral copies(<138 copies/mL). A negative result must be combined with clinical observations, patient history, and epidemiological information. The expected result is Negative.  Fact Sheet for Patients:  EntrepreneurPulse.com.au  Fact Sheet for Healthcare Providers:  IncredibleEmployment.be  This test is no t yet approved or cleared by the [place] FDA and  has been authorized for detection and/or diagnosis of SARS-CoV-2 by FDA under an Emergency Use Authorization (EUA). This EUA will remain  in effect (meaning this test can be used) for the duration of the COVID-19 declaration under Section 564(b)(1) of the Act, 21 U.S.C.section 360bbb-3(b)(1), unless the authorization is terminated  or revoked sooner.       Influenza A by PCR NEGATIVE NEGATIVE   Influenza B by PCR NEGATIVE NEGATIVE    Comment: (NOTE) The Xpert Xpress SARS-CoV-2/FLU/RSV plus assay is intended as an aid in the  diagnosis of influenza from Nasopharyngeal swab specimens and should not be used as a sole basis for treatment. Nasal washings and aspirates are unacceptable for Xpert Xpress SARS-CoV-2/FLU/RSV testing.  Fact Sheet for Patients: EntrepreneurPulse.com.au  Fact Sheet for Healthcare Providers: IncredibleEmployment.be  This test is not yet approved or cleared by the [place] FDA and has been authorized for detection and/or diagnosis of SARS-CoV-2 by FDA under an Emergency Use Authorization (EUA). This EUA will remain in effect (meaning this test can be used) for the duration of the COVID-19 declaration under Section 564(b)(1) of the Act, 21 U.S.C. section 360bbb-3(b)(1), unless the authorization is terminated or revoked.  Performed at [facility], [address] [place], [place] [address]   Blood gas, venous     Status: Abnormal   Collection Time: 08/08/20  7:49 PM  Result Value Ref Range   pH, Ven 7.03 (LL) 7.250 - 7.430    Comment: CRITICAL RESULT CALLED TO, READ BACK BY AND VERIFIED WITH: PADUCHOWSKI K MDAT 2037 08/08/2020 BY S DAVID RRT    pCO2, Ven 23 (L) 44.0 - 60.0 mmHg   pO2, Ven 57.0 (H) 32.0 - 45.0 mmHg   Bicarbonate 6.1 (L) 20.0 - 28.0 mmol/L   Acid-base deficit 23.2 (H) 0.0 - 2.0 mmol/L   O2 Saturation 71.6 %   Patient temperature 37.0    Collection site LINE    Sample type VENOUS     Comment: Performed at [facility], [place], [place] [address]  Troponin I (High Sensitivity)     Status: Abnormal   Collection Time: 08/08/20  9:18 PM  Result Value Ref Range   Troponin I (High Sensitivity) 25 (H) <18 ng/L    Comment: (NOTE) Elevated high sensitivity troponin I (hsTnI) values and significant  changes across serial measurements may suggest ACS but many other  chronic and acute conditions are known to elevate hsTnI results.  Refer to the "Links" section  for chest pain algorithms and  additional  guidance. Performed at [facility], [address] [place], [place] [address]   Lactic acid, plasma     Status: None   Collection Time: 08/08/20  9:18 PM  Result Value Ref Range   Lactic Acid, Venous 0.6 0.5 - 1.9 mmol/L    Comment: Performed at [facility], [address], [place],  [address]   CT Head Wo Contrast  Result Date: 08/08/2020 CLINICAL DATA:  Neurologic deficit.  Evaluate for stroke. EXAM: CT HEAD WITHOUT CONTRAST TECHNIQUE: Contiguous axial images were obtained from the base of the skull through the vertex without intravenous contrast. COMPARISON:  04/22/2020 FINDINGS: Brain: No evidence of acute infarction, hemorrhage, hydrocephalus, extra-axial collection or mass lesion/mass effect. Vascular: No hyperdense vessel or unexpected calcification. Skull: Normal. Negative for fracture or focal lesion. Sinuses/Orbits: No acute finding. Other: None IMPRESSION: 1. No acute intracranial abnormalities.  Normal brain Electronically Signed   By: Kerby Moors M.D.   On: 08/08/2020 18:27   DG Chest Portable 1 View  Result Date: 08/08/2020 CLINICAL DATA:  Cough, abdominal pain EXAM: PORTABLE CHEST 1 VIEW COMPARISON:  06/29/2020 FINDINGS: Nipple shadows overlying the bilateral lower lungs. Lungs are clear. No pleural effusion or pneumothorax. The heart is normal in size. IMPRESSION: No evidence of acute cardiopulmonary disease. Electronically Signed   By: Julian Hy M.D.   On: 08/08/2020 18:34    Review of Systems  Constitutional: Positive for activity change, appetite change and fatigue.  HENT: Negative.   Respiratory: Negative.   Cardiovascular: Negative.   Endocrine: Negative.   Genitourinary: Negative for difficulty urinating.  Musculoskeletal: Negative.   Neurological: Positive for light-headedness. Negative for seizures and speech difficulty.  Hematological: Negative.   Psychiatric/Behavioral: Positive for decreased concentration.     Blood pressure 137/61, pulse 91, temperature 97.9 F (36.6 C), temperature source Oral, resp. rate 18, height '5\' 9"'$  (1.753 m), weight 56.7 kg, SpO2 99 %. Physical Exam Constitutional:      Appearance: She is ill-appearing.  HENT:     Head: Normocephalic and atraumatic.     Mouth/Throat:     Pharynx: Oropharynx is clear.  Cardiovascular:     Rate and Rhythm: Tachycardia present.     Heart sounds: Normal heart sounds.  Pulmonary:     Effort: Pulmonary effort is normal.     Breath sounds: No stridor.  Abdominal:     General: Abdomen is flat.  Skin:    General: Skin is warm and dry.  Neurological:     General: No focal deficit present.     Mental Status: She is alert and oriented to person, place, and time.  Psychiatric:        Mood and Affect: Mood normal.        Behavior: Behavior normal.      Assessment/Plan  Acute kidney injury on chronic kidney disease stage III: Secondary to acute dehydration from volume depletion and possible laxative abuse.  Patient has been adequately volume repleted with IV fluids.  Blood pressure remained stable.  We will avoid nephrotoxic medication.  Foley catheter will be placed for input and output monitoring.  Nephrology consult has been requested for further management.  Multiple electrolyte abnormalities including hyponatremia and hypokalemia: Underlying eating disorder.  Caution with repletion due to risk of refeeding syndrome.  Patient is hypovolemic and likely contributing to hyponatremia.  Poor oral intake would also be likely.  Patient has been adequately volume repleted.  Monitor serum sodium  every 4 hourly.  Severe hypokalemia: Hypomagnesemia most likely contributory.  Sent for serum magnesium level.  Replete serum potassium was appropriate.  Acute anion gap metabolic acidosis: Multifactorial.  Patient has baseline chronic metabolic acidosis most likely due to GI losses versus RTA.  Due to severity of metabolic acidosis, will initiate  bicarb replacement drip.  Monitor serum bicarb every 4 hourly.  Nephrology has been consulted.  Chronic constipation: Attributed to laxative abuse in the past.  Trial of stool softeners with caution will be advised.  Acute encephalopathy: Present admission most likely metabolic in etiology: CT scan of the head was negative for any acute intracranial abnormality.Improved with treatment being offered  Malnutrition: Most likely secondary to eating disorder/laxative abuse: Nutritionist evaluation will be warranted prior to discharge.  Peripheral neuropathy: On gabapentin.  History of urinary retention: Chronically on Flomax, oxybutynin.  We will continue with home medications as appropriate.  Will send for urinalysis  History of alcohol use: Monitor for alcohol withdrawal.  CIWA protocol in place.    [place], MD 08/08/2020, 9:58 PM

## 2020-08-08 NOTE — ED Provider Notes (Signed)
[facility] Emergency Department Provider Note  Time seen: 5:55 PM  I have reviewed the triage vital signs and the nursing notes.   HISTORY  Chief Complaint Abdominal Pain   HPI Katherine Perez is a 50 y.o. female with a past medical history of CKD, past alcohol abuse, gastric reflux, presents to the emergency department for multiple complaints.  According to the patient and family member she has been feeling very weak and fatigued over the past 3 days with difficulty getting out of bed.  States no appetite has not been eating or drinking much over the past 3 to 4 days as well.  Patient states mild abdominal discomfort which she states is chronic but denies any abdominal pain worse than normal.  Had one episode of vomiting today but states she was coughing at the time is not sure if it is sputum.  Family member states she has had slurred speech for the past 3 days which is typical when her electrolytes are out of balance per family member.  Past Medical History:  Diagnosis Date  . Anemia   . Asthma   . CKD (chronic kidney disease)   . ETOH abuse   . GERD (gastroesophageal reflux disease)   . IC (interstitial cystitis)   . Laxative abuse   . Neuropathy   . Pneumonia   . Raynaud disease     Patient Active Problem List   Diagnosis Date Noted  . Other specified diseases of biliary tract   . Disease of biliary tract, unspecified   . Bile leak 06/30/2020  . Sepsis ([place]) 06/29/2020  . 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  . Hyperkalemia 06/27/2020  . CKD (chronic kidney disease) stage 3, GFR 30-59 ml/min (HCC) 06/27/2020  . Acute pancreatitis without infection or necrosis 04/23/2020  . GERD without esophagitis 04/23/2020  . Hypokalemia 04/23/2020  . Prolonged QT interval 04/23/2020  . Hypercalcemia   . Pressure injury of skin 10/27/2019  . Hyponatremia 10/26/2019  . Increased anion gap metabolic acidosis 123456  . Acute renal failure superimposed on  stage 4 chronic kidney disease ([place]) 10/26/2019  . Acute metabolic encephalopathy 123456  . Leukocytosis 10/26/2019    Past Surgical History:  Procedure Laterality Date  . COLONOSCOPY    . ERCP N/A 07/01/2020   Procedure: ENDOSCOPIC RETROGRADE CHOLANGIOPANCREATOGRAPHY (ERCP);  Surgeon: Lucilla Lame, MD;  Location: [facility] ENDOSCOPY;  Service: Endoscopy;  Laterality: N/A;  . ESOPHAGOGASTRODUODENOSCOPY    . FRACTURE SURGERY     femur fracture left - 2019  . LEG SURGERY      Prior to Admission medications   Medication Sig Start Date End Date Taking? Authorizing Provider  albuterol (VENTOLIN HFA) 108 (90 Base) MCG/ACT inhaler Inhale 2 puffs into the lungs every 4 (four) hours as needed for shortness of breath or wheezing. 06/07/19   [provider]  amitriptyline (ELAVIL) 25 MG tablet Take 25-50 mg by mouth at bedtime. 06/13/20   [provider]  bisacodyl (DULCOLAX) 5 MG EC tablet Take 2 tablets (10 mg total) by mouth at bedtime as needed for moderate constipation. 07/06/20 07/06/21  Val Riles, MD  Brimonidine Tartrate 0.025 % SOLN Place 1 drop into both eyes daily.    [provider]  Cholecalciferol (VITAMIN D) 125 MCG (5000 UT) CAPS Take 5,000 Units by mouth daily.    [provider]  famotidine (PEPCID) 40 MG tablet Take 40 mg by mouth at bedtime. 10/07/19   [provider]  fexofenadine (ALLEGRA) 180 MG tablet Take  180 mg by mouth daily as needed for allergies.    [provider]  FIBER PO Take 1 capsule by mouth daily.    [provider]  fluticasone (FLONASE) 50 MCG/ACT nasal spray Place 2 sprays into both nostrils 2 (two) times daily.  09/14/19   [provider]  Fluticasone-Salmeterol (ADVAIR) 250-50 MCG/DOSE AEPB Inhale 1 puff into the lungs 2 (two) times daily. 09/14/19   [provider]  gabapentin (NEURONTIN) 300 MG capsule Take 1 capsule (300 mg total) by mouth 3 (three) times daily. Patient taking  differently: Take 300 mg by mouth See admin instructions. Take 300 mg by mouth in the morning, 900 mg in the afternoon and 300 mg at bedtime 04/26/20   Fritzi Mandes, MD  hydrOXYzine (ATARAX/VISTARIL) 25 MG tablet Take 25-50 mg by mouth at bedtime.    [provider]  Ibuprofen-Acetaminophen (ADVIL DUAL ACTION) 125-250 MG TABS Take 2 tablets by mouth daily as needed (pain/headache).    [provider]  iron polysaccharides (NIFEREX) 150 MG capsule Take 1 capsule (150 mg total) by mouth daily. 07/06/20 08/05/20  Val Riles, MD  medroxyPROGESTERone (DEPO-PROVERA) 150 MG/ML injection Inject 150 mg into the muscle every 3 (three) months.    [provider]  melatonin 3 MG TABS tablet Take 2 tablets (6 mg total) by mouth at bedtime. 07/06/20   Val Riles, MD  metoCLOPramide (REGLAN) 10 MG tablet Take 10 mg by mouth in the morning and at bedtime. 10/17/19   [provider]  ondansetron (ZOFRAN) 8 MG tablet Take 8 mg by mouth every 8 (eight) hours as needed for vomiting or nausea. 10/23/19   [provider]  oxybutynin (DITROPAN-XL) 10 MG 24 hr tablet Take 10 mg by mouth daily at 12 noon. 04/17/20   [provider]  pentosan polysulfate (ELMIRON) 100 MG capsule Take 100 mg by mouth at bedtime.    [provider]  polyethylene glycol (MIRALAX) 17 g packet Take 17 g by mouth daily. 07/06/20   Val Riles, MD  promethazine (PHENERGAN) 25 MG tablet Take 25 mg by mouth every 8 (eight) hours as needed for nausea or vomiting.  10/23/19   [provider]  RABEprazole (ACIPHEX) 20 MG tablet Take 20 mg by mouth in the morning and at bedtime.  10/12/19   [provider]  sodium citrate-citric acid (ORACIT) 500-334 MG/5ML solution Take 15 mLs by mouth in the morning and at bedtime. 05/30/20   [provider]  tamsulosin (FLOMAX) 0.4 MG CAPS capsule Take 0.4 mg by mouth at bedtime. 10/10/19   [provider]  theophylline (UNIPHYL)  400 MG 24 hr tablet Take 400 mg by mouth 2 (two) times daily.  10/10/19   [provider]  traMADol (ULTRAM) 50 MG tablet Take 50 mg by mouth every 6 (six) hours as needed for moderate pain.    [provider]  trimethoprim (TRIMPEX) 100 MG tablet Take 100 mg by mouth daily. 03/28/20   [provider]  TRULANCE 3 MG TABS Take 3 mg by mouth daily at 6 (six) AM.  10/12/19   [provider]    Allergies  Allergen Reactions  . Cetirizine Hives  . Diazepam Other (See Comments)    Depression   . Baclofen Anxiety and Other (See Comments)    AMS - "Really messed me up, caused me to drop things"     Family History  Problem Relation Age of Onset  . Other Neg Hx  Social History Social History   Tobacco Use  . Smoking status: Current Every Day Smoker    Types: Cigarettes  . Smokeless tobacco: Never Used  Substance Use Topics  . Alcohol use: Yes    Comment: last drink 03/2020  . Drug use: Yes    Types: Marijuana    Review of Systems Constitutional: Negative for fever.  Generalized fatigue and weakness x3 days Cardiovascular: Negative for chest pain. Respiratory: Negative for shortness of breath. Gastrointestinal: Chronic abdominal pain.  Possible episode of vomiting this morning. Genitourinary: Negative for urinary compaints Musculoskeletal: Negative for musculoskeletal complaints Neurological: Negative for headache All other ROS negative  ____________________________________________   PHYSICAL EXAM:  VITAL SIGNS: ED Triage Vitals  Enc Vitals Group     BP 08/08/20 1743 123/71     Pulse Rate 08/08/20 1743 60     Resp 08/08/20 1743 16     Temp 08/08/20 1743 97.9 F (36.6 C)     Temp Source 08/08/20 1743 Oral     SpO2 08/08/20 1743 100 %     Weight 08/08/20 1744 125 lb (56.7 kg)     Height 08/08/20 1744 '5\' 9"'$  (1.753 m)     Head Circumference --      Peak Flow --      Pain Score 08/08/20 1743 8     Pain Loc --      Pain Edu? --       Excl. in [place]? --     Constitutional: Alert and oriented. Well appearing and in no distress. Eyes: Normal exam ENT      Head: Normocephalic and atraumatic.      Mouth/Throat: Mucous membranes are moist. Cardiovascular: Normal rate, regular rhythm. No murmur Respiratory: Normal respiratory effort without tachypnea nor retractions. Breath sounds are clear  Gastrointestinal: Soft and nontender. No distention Musculoskeletal: Nontender with normal range of motion in all extremities.  Neurologic:  Normal speech and language. No gross focal neurologic deficits  Skin:  Skin is warm, dry and intact.  Psychiatric: Mood and affect are normal.   ____________________________________________    EKG  EKG viewed and interpreted by myself shows a sinus rhythm at 91 bpm with a narrow QRS, normal axis, QTC prolongation otherwise normal intervals with nonspecific ST changes but no concerning ST elevation.  ____________________________________________    RADIOLOGY  CT head is negative for acute abnormality. Chest x-ray is negative for acute abnormality.  ____________________________________________   INITIAL IMPRESSION / ASSESSMENT AND PLAN / ED COURSE  Pertinent labs & imaging results that were available during my care of the patient were reviewed by me and considered in my medical decision making (see chart for details).   Patient presents to the emergency department for multiple complaints including generalized fatigue weakness, nausea, decreased appetite and slurred speech.  Family members who states the patient has multiple medical issues as well as medical complications including gallbladder removal in March complicated by biliary leak.  Denies any fever or urinary symptoms.  1 episode of possible vomit this morning but patient states she was coughing and it could have been sputum.  We will obtain a chest x-ray as a precaution given the cough.  We will check labs and continue to closely  monitor.  We will IV hydrate while awaiting results.  Patient agreeable to plan of care.  Lab work has resulted showing very low potassium, sodium, bicarbonate and signs of dehydration with acute renal insufficiency.  We will dose a second liter of normal saline followed  by a sodium bicarbonate infusion.  We will replete potassium with IV and oral potassium.  Patient will be admitted to the hospital service for further work-up and treatment.  Maudella Langill was evaluated in Emergency Department on 08/08/2020 for the symptoms described in the history of present illness. She was evaluated in the context of the global COVID-19 pandemic, which necessitated consideration that the patient might be at risk for infection with the SARS-CoV-2 virus that causes COVID-19. Institutional protocols and algorithms that pertain to the evaluation of patients at risk for COVID-19 are in a state of rapid change based on information released by regulatory bodies including the CDC and federal and state organizations. These policies and algorithms were followed during the patient's care in the ED.  ____________________________________________   FINAL CLINICAL IMPRESSION(S) / ED DIAGNOSES  Weakness Hypokalemia Acute renal insufficiency Hyponatremia    Harvest Dark, MD 08/08/20 1950

## 2020-08-08 NOTE — ED Notes (Signed)
MD at bedside. 

## 2020-08-09 ENCOUNTER — Inpatient Hospital Stay: Payer: BC Managed Care – PPO

## 2020-08-09 DIAGNOSIS — N179 Acute kidney failure, unspecified: Secondary | ICD-10-CM | POA: Diagnosis present

## 2020-08-09 DIAGNOSIS — G9341 Metabolic encephalopathy: Secondary | ICD-10-CM

## 2020-08-09 DIAGNOSIS — E871 Hypo-osmolality and hyponatremia: Secondary | ICD-10-CM

## 2020-08-09 LAB — BASIC METABOLIC PANEL
Anion gap: 13 (ref 5–15)
Anion gap: 14 (ref 5–15)
Anion gap: 11 (ref 5–15)
BUN: 91 mg/dL — ABNORMAL HIGH (ref 6–20)
BUN: 81 mg/dL — ABNORMAL HIGH (ref 6–20)
BUN: 78 mg/dL — ABNORMAL HIGH (ref 6–20)
CO2: 11 mmol/L — ABNORMAL LOW (ref 22–32)
CO2: 13 mmol/L — ABNORMAL LOW (ref 22–32)
CO2: 16 mmol/L — ABNORMAL LOW (ref 22–32)
Calcium: 9.2 mg/dL (ref 8.9–10.3)
Calcium: 8.8 mg/dL — ABNORMAL LOW (ref 8.9–10.3)
Calcium: 8 mg/dL — ABNORMAL LOW (ref 8.9–10.3)
Chloride: 107 mmol/L (ref 98–111)
Chloride: 105 mmol/L (ref 98–111)
Chloride: 106 mmol/L (ref 98–111)
Creatinine, Ser: 3.18 mg/dL — ABNORMAL HIGH (ref 0.44–1.00)
Creatinine, Ser: 2.9 mg/dL — ABNORMAL HIGH (ref 0.44–1.00)
Creatinine, Ser: 2.73 mg/dL — ABNORMAL HIGH (ref 0.44–1.00)
GFR, Estimated: 17 mL/min — ABNORMAL LOW (ref >60)
GFR, Estimated: 19 mL/min — ABNORMAL LOW (ref >60)
GFR, Estimated: 21 mL/min — ABNORMAL LOW (ref >60)
Glucose, Bld: 115 mg/dL — ABNORMAL HIGH (ref 70–99)
Glucose, Bld: 98 mg/dL (ref 70–99)
Glucose, Bld: 109 mg/dL — ABNORMAL HIGH (ref 70–99)
Potassium: 2.1 mmol/L — CL (ref 3.5–5.1)
Potassium: 2.7 mmol/L — CL (ref 3.5–5.1)
Potassium: 3.1 mmol/L — ABNORMAL LOW (ref 3.5–5.1)
Sodium: 131 mmol/L — ABNORMAL LOW (ref 135–145)
Sodium: 132 mmol/L — ABNORMAL LOW (ref 135–145)
Sodium: 133 mmol/L — ABNORMAL LOW (ref 135–145)

## 2020-08-09 LAB — OSMOLALITY, URINE: Osmolality, Ur: 199 mOsm/kg — ABNORMAL LOW (ref 300–900)

## 2020-08-09 LAB — URINALYSIS, COMPLETE (UACMP) WITH MICROSCOPIC
Bilirubin Urine: NEGATIVE
Glucose, UA: NEGATIVE mg/dL
Ketones, ur: NEGATIVE mg/dL
Nitrite: NEGATIVE
Protein, ur: NEGATIVE mg/dL
Specific Gravity, Urine: 1.005 (ref 1.005–1.030)
WBC, UA: >50 WBC/hpf — ABNORMAL HIGH (ref 0–5)
pH: 6 (ref 5.0–8.0)

## 2020-08-09 LAB — LACTIC ACID, PLASMA: Lactic Acid, Venous: 0.8 mmol/L (ref 0.5–1.9)

## 2020-08-09 LAB — CHLORIDE, URINE, RANDOM: Chloride Urine: 46 mmol/L

## 2020-08-09 LAB — CBC
HCT: 29.7 % — ABNORMAL LOW (ref 36.0–46.0)
Hemoglobin: 10.2 g/dL — ABNORMAL LOW (ref 12.0–15.0)
MCH: 30.6 pg (ref 26.0–34.0)
MCHC: 34.3 g/dL (ref 30.0–36.0)
MCV: 89.2 fL (ref 80.0–100.0)
Platelets: 396 10*3/uL (ref 150–400)
RBC: 3.33 MIL/uL — ABNORMAL LOW (ref 3.87–5.11)
RDW: 14.2 % (ref 11.5–15.5)
WBC: 13.6 10*3/uL — ABNORMAL HIGH (ref 4.0–10.5)
nRBC: 0 % (ref 0.0–0.2)

## 2020-08-09 LAB — NA AND K (SODIUM & POTASSIUM), RAND UR
Potassium Urine: 6 mmol/L
Sodium, Ur: 52 mmol/L

## 2020-08-09 LAB — MAGNESIUM: Magnesium: 1.9 mg/dL (ref 1.7–2.4)

## 2020-08-09 MED ORDER — GABAPENTIN 300 MG PO CAPS
300.0000 mg | ORAL_CAPSULE | Freq: Two times a day (BID) | ORAL | Status: DC
  Administered 2020-08-09 – 2020-08-12 (×7): 300 mg via ORAL
  Filled 2020-08-09 (×6): qty 1

## 2020-08-09 MED ORDER — THIAMINE HCL 100 MG PO TABS
100.0000 mg | ORAL_TABLET | Freq: Every day | ORAL | Status: DC
  Administered 2020-08-09 – 2020-08-12 (×4): 100 mg via ORAL
  Filled 2020-08-09 (×3): qty 1

## 2020-08-09 MED ORDER — GABAPENTIN 300 MG PO CAPS
900.0000 mg | ORAL_CAPSULE | Freq: Every day | ORAL | Status: DC
  Administered 2020-08-09 – 2020-08-11 (×3): 900 mg via ORAL
  Filled 2020-08-09 (×3): qty 3

## 2020-08-09 MED ORDER — DIPHENOXYLATE-ATROPINE 2.5-0.025 MG PO TABS
1.0000 | ORAL_TABLET | Freq: Once | ORAL | Status: AC
  Administered 2020-08-09: 1 via ORAL
  Filled 2020-08-09: qty 1

## 2020-08-09 MED ORDER — FAMOTIDINE 20 MG PO TABS
10.0000 mg | ORAL_TABLET | Freq: Every day | ORAL | Status: DC
  Administered 2020-08-10 – 2020-08-11 (×2): 10 mg via ORAL
  Filled 2020-08-09 (×2): qty 1

## 2020-08-09 MED ORDER — MAGNESIUM SULFATE 2 GM/50ML IV SOLN
2.0000 g | Freq: Once | INTRAVENOUS | Status: AC
  Administered 2020-08-09: 2 g via INTRAVENOUS
  Filled 2020-08-09: qty 50

## 2020-08-09 MED ORDER — POTASSIUM CHLORIDE 10 MEQ/100ML IV SOLN
10.0000 meq | INTRAVENOUS | Status: AC
  Administered 2020-08-09 (×3): 10 meq via INTRAVENOUS
  Filled 2020-08-09 (×2): qty 100

## 2020-08-09 MED ORDER — POTASSIUM CHLORIDE CRYS ER 20 MEQ PO TBCR
40.0000 meq | EXTENDED_RELEASE_TABLET | ORAL | Status: AC
  Administered 2020-08-09 (×2): 40 meq via ORAL
  Filled 2020-08-09 (×2): qty 2

## 2020-08-09 MED ORDER — POTASSIUM CHLORIDE CRYS ER 20 MEQ PO TBCR
40.0000 meq | EXTENDED_RELEASE_TABLET | ORAL | Status: AC
  Administered 2020-08-09: 10 meq via ORAL
  Administered 2020-08-09: 40 meq via ORAL
  Filled 2020-08-09 (×2): qty 2

## 2020-08-09 MED ORDER — POTASSIUM CHLORIDE 10 MEQ/100ML IV SOLN
10.0000 meq | INTRAVENOUS | Status: AC
  Administered 2020-08-09 (×4): 10 meq via INTRAVENOUS
  Filled 2020-08-09 (×3): qty 100

## 2020-08-09 NOTE — ED Notes (Signed)
Pt assisted up to bathroom with 2 person assist. Unable to stand independently and unsteady on feet

## 2020-08-09 NOTE — Progress Notes (Addendum)
[facility] PROGRESS NOTE   Katherine Perez [id number] DOB: [birth year] DOA: 08/08/2020  PCP: Gladstone Lighter, MD  Brief History/Interval Summary: 50 y.o. female with medical history significant for eating disorder/laxative abuse, chronic kidney disease stage IIIa (baseline Scr of 1.5), recurrent UTIrecurrent UTI, incompletebladder emptying, anxiety disorder, insomnia, neuropathy and history of status recent ERCP and stent placement in 06/2020.  She presented to the emergency department accompanied by her husband with complaints of generalized weakness over the past 4 days.    Patient was found to have acute kidney injury with severe hyperkalemia.  She was hospitalized for further management.  She was also noted to have severe metabolic acidosis and placed on bicarbonate infusion.    Consultants: Nephrology was consulted by admitting team  Procedures: None  Antibiotics: Anti-infectives (From admission, onward)   None      Subjective/Interval History: Patient noted to be slightly confused this morning.  Denies any nausea vomiting since yesterday.  No abdominal pain.     Assessment/Plan:  Acute kidney injury Baseline creatinine is around 1.5.  Presented with creatinine of 3.59 with elevated BUN.  Etiology is likely due to hypovolemia as well as her history of laxative abuse and eating disorder.  Patient is on IV fluids.  She has been urinating.  Creatinine noted to be slightly better this morning.  Nephrology was consulted by the admitting team.  Await their input. Renal ultrasound will be ordered.  UA.  Severe metabolic acidosis Likely due to her acute kidney injury.  Anion gap was noted to be elevated yesterday but noted to be normal this morning.  Started on IV bicarbonate infusion.  Bicarbonate level noted to be better this morning.  We will recheck it later today.  Severe hypokalemia/hyponatremia Magnesium was 1.9.  Patient was given potassium supplementation  overnight without any significant improvement.  Potassium remains 2.1.  Continue telemetry monitoring.  Aggressively replete potassium.  Recheck labs later today. Low sodium level likely due to hypovolemia.  Has improved with hydration.  Recent hospitalization for bile leak after she underwent cholecystectomy She was found to have ductal dilatation.  P on 3/11 with stent placement for bile leak.  LFTs noted to be stable.  Not particularly tender in the abdomen at this time.  Continue to monitor for now.  Acute encephalopathy, metabolic CT head was negative for any acute findings.  This is likely due to metabolic derangements.  No focal deficits noted on examination.  Reorient daily.  Ammonia level was normal.  Normocytic anemia/leukocytosis Reason for elevated WBC likely reactive.  Noted to be better this morning.  Drop in hemoglobin is dilutional.  No overt bleeding noted.  Recheck tomorrow.  Chronic constipation with laxative abuse Continue home medications for now.  Peripheral neuropathy On gabapentin  History of urinary retention She is chronically on Flomax which will be continued.  Bladder scans every shift.  History of alcohol abuse Monitor for signs of withdrawal.  No withdrawal noticed currently.    DVT Prophylaxis: Lovenox Code Status: Full code Family Communication: No family at bedside Disposition Plan: Hopefully return home in improved  Status is: Inpatient  Remains inpatient appropriate because:Persistent severe electrolyte disturbances and IV treatments appropriate due to intensity of illness or inability to take PO   Dispo: The patient is from: Home              Anticipated d/c is to: Home              Patient currently is not  medically stable to d/c.   Difficult to place patient No       Medications:  Scheduled: . amitriptyline  25 mg Oral QHS  . aspirin EC  81 mg Oral Daily  . Brimonidine Tartrate  1 drop Both Eyes Daily  . enoxaparin (LOVENOX)  injection  30 mg Subcutaneous Q24H  . famotidine  40 mg Oral QHS  . fluticasone  2 spray Each Nare BID  . gabapentin  300 mg Oral BID  . gabapentin  900 mg Oral Q1400  . multivitamin with minerals  1 tablet Oral Daily  . oxybutynin  10 mg Oral Q1200  . pantoprazole  40 mg Oral Daily  . pentosan polysulfate  100 mg Oral QHS  . Plecanatide  3 mg Oral Q0600  . polyethylene glycol  17 g Oral Daily  . potassium chloride  40 mEq Oral Q4H  . sodium citrate-citric acid  15 mL Oral BID PC  . tamsulosin  0.4 mg Oral QHS   Continuous: . potassium chloride 10 mEq (08/09/20 0921)  . sodium bicarbonate 150 mEq in D5W infusion 200 mL/hr at 08/09/20 0439   [id number], bisacodyl, traMADol   Objective:  Vital Signs  Vitals:   08/09/20 0730 08/09/20 0755 08/09/20 0755 08/09/20 0800  BP:  135/77 135/77   Pulse: 95 89 92 89  Resp: '19 11  11  '$ Temp:      TempSrc:      SpO2: 100% 100%  100%  Weight:      Height:        Intake/Output Summary (Last 24 hours) at 08/09/2020 1035 Last data filed at 08/08/2020 2135 Gross per 24 hour  Intake 2574.66 ml  Output --  Net 2574.66 ml   Filed Weights   08/08/20 1744  Weight: 56.7 kg    General appearance: Awake alert.  In no distress.  Disoriented Resp: Clear to auscultation bilaterally.  Normal effort Cardio: S1-S2 is normal regular.  No S3-S4.  No rubs murmurs or bruit GI: Abdomen is soft.  Nontender nondistended.  Bowel sounds are present normal.  No masses organomegaly Extremities: No edema.  Noted to be moving all 4 extremities Neurologic:  No focal neurological deficits.    Lab Results:  Data Reviewed: I have personally reviewed following labs and imaging studies  CBC: Recent Labs  Lab 08/08/20 1816 08/09/20 0417  WBC 17.4* 13.6*  NEUTROABS 12.6*  --   HGB 12.0 10.2*  HCT 34.7* 29.7*  MCV 89.9 89.2  PLT 473* [id number]    Basic Metabolic Panel: Recent Labs  Lab 08/08/20 1816 08/08/20 2118 08/09/20 0417  NA 127* 130* 131*   K 2.3* 2.4* 2.1*  CL 100 110 107  CO2 9* 8* 11*  GLUCOSE 97 89 115*  BUN 94* 96* 91*  CREATININE 3.59* 3.39* 3.18*  CALCIUM 10.9* 9.6 9.2  MG  --   --  1.9    GFR: Estimated Creatinine Clearance: 19.4 mL/min (A) (by C-G formula based on SCr of 3.18 mg/dL (H)).  Liver Function Tests: Recent Labs  Lab 08/08/20 1816  AST 12*  ALT 11  ALKPHOS 128*  BILITOT 0.7  PROT 7.2  ALBUMIN 3.8    Recent Labs  Lab 08/08/20 1816  LIPASE 62*   Recent Labs  Lab 08/08/20 1816  AMMONIA 18     Thyroid Function Tests: Recent Labs    08/08/20 2118  TSH 3.330     Recent Results (from the past 240 hour(s))  Resp Panel by  RT-PCR (Flu A&B, Covid) Nasopharyngeal Swab     Status: None   Collection Time: 08/08/20  7:33 PM   Specimen: Nasopharyngeal Swab; Nasopharyngeal(NP) swabs in vial transport medium  Result Value Ref Range Status   SARS Coronavirus 2 by RT PCR NEGATIVE NEGATIVE Final    Comment: (NOTE) SARS-CoV-2 target nucleic acids are NOT DETECTED.  The SARS-CoV-2 RNA is generally detectable in upper respiratory specimens during the acute phase of infection. The lowest concentration of SARS-CoV-2 viral copies this assay can detect is 138 copies/mL. A negative result does not preclude SARS-Cov-2 infection and should not be used as the sole basis for treatment or other patient management decisions. A negative result may occur with  improper specimen collection/handling, submission of specimen other than nasopharyngeal swab, presence of viral mutation(s) within the areas targeted by this assay, and inadequate number of viral copies(<138 copies/mL). A negative result must be combined with clinical observations, patient history, and epidemiological information. The expected result is Negative.  Fact Sheet for Patients:  EntrepreneurPulse.com.au  Fact Sheet for Healthcare Providers:  IncredibleEmployment.be  This test is no t yet  approved or cleared by the [place] FDA and  has been authorized for detection and/or diagnosis of SARS-CoV-2 by FDA under an Emergency Use Authorization (EUA). This EUA will remain  in effect (meaning this test can be used) for the duration of the COVID-19 declaration under Section 564(b)(1) of the Act, 21 U.S.C.section 360bbb-3(b)(1), unless the authorization is terminated  or revoked sooner.       Influenza A by PCR NEGATIVE NEGATIVE Final   Influenza B by PCR NEGATIVE NEGATIVE Final    Comment: (NOTE) The Xpert Xpress SARS-CoV-2/FLU/RSV plus assay is intended as an aid in the diagnosis of influenza from Nasopharyngeal swab specimens and should not be used as a sole basis for treatment. Nasal washings and aspirates are unacceptable for Xpert Xpress SARS-CoV-2/FLU/RSV testing.  Fact Sheet for Patients: EntrepreneurPulse.com.au  Fact Sheet for Healthcare Providers: IncredibleEmployment.be  This test is not yet approved or cleared by the [place] FDA and has been authorized for detection and/or diagnosis of SARS-CoV-2 by FDA under an Emergency Use Authorization (EUA). This EUA will remain in effect (meaning this test can be used) for the duration of the COVID-19 declaration under Section 564(b)(1) of the Act, 21 U.S.C. section 360bbb-3(b)(1), unless the authorization is terminated or revoked.  Performed at [facility], [address], [place], [place] [address]       Radiology Studies: CT Head Wo Contrast  Result Date: 08/08/2020 CLINICAL DATA:  Neurologic deficit.  Evaluate for stroke. EXAM: CT HEAD WITHOUT CONTRAST TECHNIQUE: Contiguous axial images were obtained from the base of the skull through the vertex without intravenous contrast. COMPARISON:  04/22/2020 FINDINGS: Brain: No evidence of acute infarction, hemorrhage, hydrocephalus, extra-axial collection or mass lesion/mass effect. Vascular: No hyperdense vessel  or unexpected calcification. Skull: Normal. Negative for fracture or focal lesion. Sinuses/Orbits: No acute finding. Other: None IMPRESSION: 1. No acute intracranial abnormalities.  Normal brain Electronically Signed   By: Kerby Moors M.D.   On: 08/08/2020 18:27   DG Chest Portable 1 View  Result Date: 08/08/2020 CLINICAL DATA:  Cough, abdominal pain EXAM: PORTABLE CHEST 1 VIEW COMPARISON:  06/29/2020 FINDINGS: Nipple shadows overlying the bilateral lower lungs. Lungs are clear. No pleural effusion or pneumothorax. The heart is normal in size. IMPRESSION: No evidence of acute cardiopulmonary disease. Electronically Signed   By: Julian Hy M.D.   On: 08/08/2020 18:34  LOS: 1 day   Akbar Sacra [employer] on [email or web address]  08/09/2020, 10:35 AM

## 2020-08-09 NOTE — ED Notes (Signed)
Pt assisted to bathroom with two person assist, pt unable to comprehend need to move forward in order to obtain urine sample from hat. Pt agreeable to purewick.

## 2020-08-09 NOTE — ED Notes (Addendum)
Pharmacy contacted at this time regarding pt medications that are due and past due. Pharmacy states med rec not complete at this time, and states to hold medications until med rec complete and other medications verified. Order for 4th bag of IV potassium timed out, pharmacy instructed this RN to hold last bag at this time until morning labs completed. If K+ still low contact provider to place new orders.

## 2020-08-09 NOTE — Progress Notes (Signed)
[facility]  ROUNDING NOTE   Subjective:   [name] was admitted to [facility] on [id number] for Metabolic acidosis [id number] AKI (acute kidney injury) [facility]) [N17.9]  Patient recently had an ERCP and stent placement for bile leak on 07/01/2020.   Patient has had generalized weakness and found to have acute kidney injury.     Objective:  Vital signs in last 24 hours:  Temp:  [97.9 F (36.6 C)] 97.9 F (36.6 C) (04/18 1743) Pulse Rate:  [60-95] 89 (04/19 0800) Resp:  [11-19] 11 (04/19 0800) BP: (123-149)/(61-93) 135/77 (04/19 0755) SpO2:  [97 %-100 %] 100 % (04/19 0800) Weight:  [56.7 kg] 56.7 kg (04/18 1744)  Weight change:  Filed Weights   08/08/20 1744  Weight: 56.7 kg    Intake/Output: I/O last 3 completed shifts: In: 2574.7 [IV Piggyback:2574.7] Out: -    Intake/Output this shift:  No intake/output data recorded.  Physical Exam: General: NAD, laying in bed  Head: Normocephalic, atraumatic. Moist oral mucosal membranes  Eyes: Anicteric, PERRL  Neck: Supple, trachea midline  Lungs:  Clear to auscultation  Heart: Regular rate and rhythm  Abdomen:  Soft, nontender  Extremities: no peripheral edema.  Neurologic: Nonfocal, moving all four extremities  Skin: No lesions  Access: none    Basic Metabolic Panel: Recent Labs  Lab 08/08/20 1816 08/08/20 2118 08/09/20 0417  NA 127* 130* 131*  K 2.3* 2.4* 2.1*  CL 100 110 107  CO2 9* 8* 11*  GLUCOSE 97 89 115*  BUN 94* 96* 91*  CREATININE 3.59* 3.39* 3.18*  CALCIUM 10.9* 9.6 9.2  MG  --   --  1.9    Liver Function Tests: Recent Labs  Lab 08/08/20 1816  AST 12*  ALT 11  ALKPHOS 128*  BILITOT 0.7  PROT 7.2  ALBUMIN 3.8   Recent Labs  Lab 08/08/20 1816  LIPASE 62*   Recent Labs  Lab 08/08/20 1816  AMMONIA 18    CBC: Recent Labs  Lab 08/08/20 1816 08/09/20 0417  WBC 17.4* 13.6*  NEUTROABS 12.6*  --   HGB 12.0 10.2*  HCT 34.7* 29.7*  MCV 89.9 89.2  PLT 473* 396     Cardiac Enzymes: No results for input(s): CKTOTAL, CKMB, CKMBINDEX, TROPONINI in the last 168 hours.  BNP: Invalid input(s): POCBNP  CBG: No results for input(s): GLUCAP in the last 168 hours.  Microbiology: Results for orders placed or performed during the hospital encounter of 08/08/20  Resp Panel by RT-PCR (Flu A&B, Covid) Nasopharyngeal Swab     Status: None   Collection Time: 08/08/20  7:33 PM   Specimen: Nasopharyngeal Swab; Nasopharyngeal(NP) swabs in vial transport medium  Result Value Ref Range Status   SARS Coronavirus 2 by RT PCR NEGATIVE NEGATIVE Final    Comment: (NOTE) SARS-CoV-2 target nucleic acids are NOT DETECTED.  The SARS-CoV-2 RNA is generally detectable in upper respiratory specimens during the acute phase of infection. The lowest concentration of SARS-CoV-2 viral copies this assay can detect is 138 copies/mL. A negative result does not preclude SARS-Cov-2 infection and should not be used as the sole basis for treatment or other patient management decisions. A negative result may occur with  improper specimen collection/handling, submission of specimen other than nasopharyngeal swab, presence of viral mutation(s) within the areas targeted by this assay, and inadequate number of viral copies(<138 copies/mL). A negative result must be combined with clinical observations, patient history, and epidemiological information. The expected result is Negative.  Fact Sheet  for Patients:  EntrepreneurPulse.com.au  Fact Sheet for Healthcare Providers:  IncredibleEmployment.be  This test is no t yet approved or cleared by the [place] FDA and  has been authorized for detection and/or diagnosis of SARS-CoV-2 by FDA under an Emergency Use Authorization (EUA). This EUA will remain  in effect (meaning this test can be used) for the duration of the COVID-19 declaration under Section 564(b)(1) of the Act, 21 U.S.C.section  360bbb-3(b)(1), unless the authorization is terminated  or revoked sooner.       Influenza A by PCR NEGATIVE NEGATIVE Final   Influenza B by PCR NEGATIVE NEGATIVE Final    Comment: (NOTE) The Xpert Xpress SARS-CoV-2/FLU/RSV plus assay is intended as an aid in the diagnosis of influenza from Nasopharyngeal swab specimens and should not be used as a sole basis for treatment. Nasal washings and aspirates are unacceptable for Xpert Xpress SARS-CoV-2/FLU/RSV testing.  Fact Sheet for Patients: EntrepreneurPulse.com.au  Fact Sheet for Healthcare Providers: IncredibleEmployment.be  This test is not yet approved or cleared by the [place] FDA and has been authorized for detection and/or diagnosis of SARS-CoV-2 by FDA under an Emergency Use Authorization (EUA). This EUA will remain in effect (meaning this test can be used) for the duration of the COVID-19 declaration under Section 564(b)(1) of the Act, 21 U.S.C. section 360bbb-3(b)(1), unless the authorization is terminated or revoked.  Performed at [facility], [address] [place], [place] [address]     Coagulation Studies: No results for input(s): LABPROT, INR in the last 72 hours.  Urinalysis: No results for input(s): COLORURINE, LABSPEC, PHURINE, GLUCOSEU, HGBUR, BILIRUBINUR, KETONESUR, PROTEINUR, UROBILINOGEN, NITRITE, LEUKOCYTESUR in the last 72 hours.  Invalid input(s): APPERANCEUR    Imaging: CT Head Wo Contrast  Result Date: 08/08/2020 CLINICAL DATA:  Neurologic deficit.  Evaluate for stroke. EXAM: CT HEAD WITHOUT CONTRAST TECHNIQUE: Contiguous axial images were obtained from the base of the skull through the vertex without intravenous contrast. COMPARISON:  04/22/2020 FINDINGS: Brain: No evidence of acute infarction, hemorrhage, hydrocephalus, extra-axial collection or mass lesion/mass effect. Vascular: No hyperdense vessel or unexpected calcification. Skull: Normal.  Negative for fracture or focal lesion. Sinuses/Orbits: No acute finding. Other: None IMPRESSION: 1. No acute intracranial abnormalities.  Normal brain Electronically Signed   By: Kerby Moors M.D.   On: 08/08/2020 18:27   DG Chest Portable 1 View  Result Date: 08/08/2020 CLINICAL DATA:  Cough, abdominal pain EXAM: PORTABLE CHEST 1 VIEW COMPARISON:  06/29/2020 FINDINGS: Nipple shadows overlying the bilateral lower lungs. Lungs are clear. No pleural effusion or pneumothorax. The heart is normal in size. IMPRESSION: No evidence of acute cardiopulmonary disease. Electronically Signed   By: Julian Hy M.D.   On: 08/08/2020 18:34     Medications:   . sodium bicarbonate 150 mEq in D5W infusion 200 mL/hr at 08/09/20 0439   . amitriptyline  25 mg Oral QHS  . aspirin EC  81 mg Oral Daily  . Brimonidine Tartrate  1 drop Both Eyes Daily  . enoxaparin (LOVENOX) injection  30 mg Subcutaneous Q24H  . famotidine  40 mg Oral QHS  . fluticasone  2 spray Each Nare BID  . gabapentin  300 mg Oral BID  . gabapentin  900 mg Oral Q1400  . multivitamin with minerals  1 tablet Oral Daily  . oxybutynin  10 mg Oral Q1200  . pantoprazole  40 mg Oral Daily  . pentosan polysulfate  100 mg Oral QHS  . Plecanatide  3 mg Oral Q0600  .  polyethylene glycol  17 g Oral Daily  . potassium chloride  40 mEq Oral Q4H  . sodium citrate-citric acid  15 mL Oral BID PC  . tamsulosin  0.4 mg Oral QHS  . thiamine  100 mg Oral Daily   albuterol, bisacodyl, traMADol  Assessment/ Plan:  [name] is a 50 y.o. 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 Acute kidney injury on chronic kidney disease IV: with history of bland urine: baseline creatinine 1.59, GFR of 40.  Acute kidney injury secondary to prerenal azotemia - Continue IV fluids  2.  Metabolic acidosis: anion gap: secondary to volume loss - bicarb infusion  3. Hypokalemia:  - Potassium replacement.    LOS: 1 Katherine Perez 4/19/202211:22 AM

## 2020-08-10 LAB — COMPREHENSIVE METABOLIC PANEL
ALT: 8 U/L (ref 0–44)
AST: 10 U/L — ABNORMAL LOW (ref 15–41)
Albumin: 2.5 g/dL — ABNORMAL LOW (ref 3.5–5.0)
Alkaline Phosphatase: 77 U/L (ref 38–126)
Anion gap: 9 (ref 5–15)
BUN: 75 mg/dL — ABNORMAL HIGH (ref 6–20)
CO2: 18 mmol/L — ABNORMAL LOW (ref 22–32)
Calcium: 7.6 mg/dL — ABNORMAL LOW (ref 8.9–10.3)
Chloride: 109 mmol/L (ref 98–111)
Creatinine, Ser: 2.58 mg/dL — ABNORMAL HIGH (ref 0.44–1.00)
GFR, Estimated: 22 mL/min — ABNORMAL LOW (ref >60)
Glucose, Bld: 100 mg/dL — ABNORMAL HIGH (ref 70–99)
Potassium: 3.2 mmol/L — ABNORMAL LOW (ref 3.5–5.1)
Sodium: 136 mmol/L (ref 135–145)
Total Bilirubin: 0.6 mg/dL (ref 0.3–1.2)
Total Protein: 4.9 g/dL — ABNORMAL LOW (ref 6.5–8.1)

## 2020-08-10 LAB — CBC
HCT: 26.1 % — ABNORMAL LOW (ref 36.0–46.0)
Hemoglobin: 9.1 g/dL — ABNORMAL LOW (ref 12.0–15.0)
MCH: 30.7 pg (ref 26.0–34.0)
MCHC: 34.9 g/dL (ref 30.0–36.0)
MCV: 88.2 fL (ref 80.0–100.0)
Platelets: 395 10*3/uL (ref 150–400)
RBC: 2.96 MIL/uL — ABNORMAL LOW (ref 3.87–5.11)
RDW: 13.9 % (ref 11.5–15.5)
WBC: 10.6 10*3/uL — ABNORMAL HIGH (ref 4.0–10.5)
nRBC: 0 % (ref 0.0–0.2)

## 2020-08-10 LAB — PHOSPHORUS: Phosphorus: 2.8 mg/dL (ref 2.5–4.6)

## 2020-08-10 LAB — MAGNESIUM: Magnesium: 1.8 mg/dL (ref 1.7–2.4)

## 2020-08-10 MED ORDER — NICOTINE 21 MG/24HR TD PT24
21.0000 mg | MEDICATED_PATCH | Freq: Every day | TRANSDERMAL | Status: DC
  Administered 2020-08-10 – 2020-08-12 (×3): 21 mg via TRANSDERMAL
  Filled 2020-08-10 (×3): qty 1

## 2020-08-10 MED ORDER — POTASSIUM CHLORIDE 10 MEQ/100ML IV SOLN: 10.0000 meq | INTRAVENOUS | Status: DC

## 2020-08-10 MED ORDER — POTASSIUM CHLORIDE 10 MEQ/100ML IV SOLN
10.0000 meq | INTRAVENOUS | Status: AC
  Administered 2020-08-10 (×3): 10 meq via INTRAVENOUS
  Filled 2020-08-10 (×3): qty 100

## 2020-08-10 NOTE — Progress Notes (Signed)
PROGRESS NOTE    Katherine Perez  [id number] DOB: [birth year] DOA: 08/08/2020 PCP: Gladstone Lighter, MD   Chief complaint.  Generalized weakness. Brief Narrative:  50 y.o.femalewith medical history significant foreating disorder/laxative abuse, chronic kidney disease stage IIIa (baseline Scr of 1.5),recurrent UTIrecurrent UTI, incompletebladder emptying, anxietydisorder, insomnia,neuropathyand history of statusrecent ERCP and stent placementin 06/2020.Shepresented to the emergency departmentaccompanied by her husband with complaints of generalized weakness over the past 4 days.  She also had significant diarrhea for 2 days. Patient was found to have acute kidney injury with severe hyperkalemia.  She was hospitalized for further management.  She was also noted to have severe metabolic acidosis and placed on bicarbonate infusion.     Assessment & Plan:   Active Problems:   Metabolic acidosis   AKI (acute kidney injury) ([place])  #1.  Acute kidney injury secondary to dehydration. Severe metabolic acidosis secondary to acute kidney injury. Hypokalemia Hyponatremia secondary to hypovolemia. Gastroenteritis. Patient condition appears to be secondary to severe dehydration.  She had severe acute hyponatremia, which has improved significantly.  She is currently on sodium bicarb drip for metabolic acidosis. Condition seem to be improving, she no longer has any diarrhea, she started eating. Appreciate nephrology consult. We will continue bicarb drip for now. Replete potassium again today.  Recheck level tomorrow.  #2.  Acute metabolic encephalopathy. Condition had improved.    DVT prophylaxis: Lovenox Code Status: Full Family Communication:  Disposition Plan:  .   Status is: Inpatient  Remains inpatient appropriate because:Inpatient level of care appropriate due to severity of illness   Dispo:  Patient From: Home  Planned Disposition: Home  Medically stable for  discharge: No          I/O last 3 completed shifts: In: 7433.7 [I.V.:4760.1; IV Piggyback:2673.7] Out: 800 [Urine:800] Total I/O In: -  Out: 300 [Urine:300]     Consultants:   Nephrology  Procedures: None  Antimicrobials:None  Subjective: Patient feels better today, diarrhea has been better.  Appetite started improving.  No nausea vomiting Denies any short of breath or cough. No dysuria hematuria pain No fever chills pain No headache or dizziness No chest pain or palpitation.  Objective: Vitals:   08/09/20 1830 08/10/20 0423 08/10/20 0816 08/10/20 0930  BP: 122/87 105/69 112/62   Pulse: 84 90 88   Resp: '18 18 17   '$ Temp:  97.7 F (36.5 C) 97.7 F (36.5 C)   TempSrc:  Oral    SpO2: 98% 100% 98%   Weight:    53 kg  Height:        Intake/Output Summary (Last 24 hours) at 08/10/2020 1025 Last data filed at 08/10/2020 0816 Gross per 24 hour  Intake 4859.06 ml  Output 1100 ml  Net 3759.06 ml   Filed Weights   08/08/20 1744 08/10/20 0930  Weight: 56.7 kg 53 kg    Examination:  General exam: Appears calm and comfortable  Respiratory system: Clear to auscultation. Respiratory effort normal. Cardiovascular system: S1 & S2 heard, RRR. No JVD, murmurs, rubs, gallops or clicks. No pedal edema. Gastrointestinal system: Abdomen is nondistended, soft and nontender. No organomegaly or masses felt. Normal bowel sounds heard. Central nervous system: Alert and oriented x3. No focal neurological deficits. Extremities: Symmetric 5 x 5 power. Skin: No rashes, lesions or ulcers Psychiatry: Judgement and insight appear normal. Mood & affect appropriate.     Data Reviewed: I have personally reviewed following labs and imaging studies  CBC: Recent Labs  Lab 08/08/20 1816 08/09/20 0417  08/10/20 0519  WBC 17.4* 13.6* 10.6*  NEUTROABS 12.6*  --   --   HGB 12.0 10.2* 9.1*  HCT 34.7* 29.7* 26.1*  MCV 89.9 89.2 88.2  PLT 473* 396 [id number]   Basic Metabolic Panel: Recent  Labs  Lab 08/08/20 2118 08/09/20 0417 08/09/20 1448 08/09/20 2047 08/10/20 0519  NA 130* 131* 132* 133* 136  K 2.4* 2.1* 2.7* 3.1* 3.2*  CL 110 107 105 106 109  CO2 8* 11* 13* 16* 18*  GLUCOSE 89 115* 98 109* 100*  BUN 96* 91* 81* 78* 75*  CREATININE 3.39* 3.18* 2.90* 2.73* 2.58*  CALCIUM 9.6 9.2 8.8* 8.0* 7.6*  MG  --  1.9  --   --  1.8  PHOS  --   --   --   --  2.8   GFR: Estimated Creatinine Clearance: 22.3 mL/min (A) (by C-G formula based on SCr of 2.58 mg/dL (H)). Liver Function Tests: Recent Labs  Lab 08/08/20 1816 08/10/20 0519  AST 12* 10*  ALT 11 8  ALKPHOS 128* 77  BILITOT 0.7 0.6  PROT 7.2 4.9*  ALBUMIN 3.8 2.5*   Recent Labs  Lab 08/08/20 1816  LIPASE 62*   Recent Labs  Lab 08/08/20 1816  AMMONIA 18   Coagulation Profile: No results for input(s): INR, PROTIME in the last 168 hours. Cardiac Enzymes: No results for input(s): CKTOTAL, CKMB, CKMBINDEX, TROPONINI in the last 168 hours. BNP (last 3 results) No results for input(s): PROBNP in the last 8760 hours. HbA1C: No results for input(s): HGBA1C in the last 72 hours. CBG: No results for input(s): GLUCAP in the last 168 hours. Lipid Profile: No results for input(s): CHOL, HDL, LDLCALC, TRIG, CHOLHDL, LDLDIRECT in the last 72 hours. Thyroid Function Tests: Recent Labs    08/08/20 2118  TSH 3.330   Anemia Panel: No results for input(s): VITAMINB12, FOLATE, FERRITIN, TIBC, IRON, RETICCTPCT in the last 72 hours. Sepsis Labs: Recent Labs  Lab 08/08/20 2118 08/09/20 0417  LATICACIDVEN 0.6 0.8    Recent Results (from the past 240 hour(s))  Resp Panel by RT-PCR (Flu A&B, Covid) Nasopharyngeal Swab     Status: None   Collection Time: 08/08/20  7:33 PM   Specimen: Nasopharyngeal Swab; Nasopharyngeal(NP) swabs in vial transport medium  Result Value Ref Range Status   SARS Coronavirus 2 by RT PCR NEGATIVE NEGATIVE Final    Comment: (NOTE) SARS-CoV-2 target nucleic acids are NOT  DETECTED.  The SARS-CoV-2 RNA is generally detectable in upper respiratory specimens during the acute phase of infection. The lowest concentration of SARS-CoV-2 viral copies this assay can detect is 138 copies/mL. A negative result does not preclude SARS-Cov-2 infection and should not be used as the sole basis for treatment or other patient management decisions. A negative result may occur with  improper specimen collection/handling, submission of specimen other than nasopharyngeal swab, presence of viral mutation(s) within the areas targeted by this assay, and inadequate number of viral copies(<138 copies/mL). A negative result must be combined with clinical observations, patient history, and epidemiological information. The expected result is Negative.  Fact Sheet for Patients:  EntrepreneurPulse.com.au  Fact Sheet for Healthcare Providers:  IncredibleEmployment.be  This test is no t yet approved or cleared by the [place] FDA and  has been authorized for detection and/or diagnosis of SARS-CoV-2 by FDA under an Emergency Use Authorization (EUA). This EUA will remain  in effect (meaning this test can be used) for the duration of the COVID-19 declaration under Section  564(b)(1) of the Act, 21 U.S.C.section 360bbb-3(b)(1), unless the authorization is terminated  or revoked sooner.       Influenza A by PCR NEGATIVE NEGATIVE Final   Influenza B by PCR NEGATIVE NEGATIVE Final    Comment: (NOTE) The Xpert Xpress SARS-CoV-2/FLU/RSV plus assay is intended as an aid in the diagnosis of influenza from Nasopharyngeal swab specimens and should not be used as a sole basis for treatment. Nasal washings and aspirates are unacceptable for Xpert Xpress SARS-CoV-2/FLU/RSV testing.  Fact Sheet for Patients: EntrepreneurPulse.com.au  Fact Sheet for Healthcare Providers: IncredibleEmployment.be  This test is not yet  approved or cleared by the [place] FDA and has been authorized for detection and/or diagnosis of SARS-CoV-2 by FDA under an Emergency Use Authorization (EUA). This EUA will remain in effect (meaning this test can be used) for the duration of the COVID-19 declaration under Section 564(b)(1) of the Act, 21 U.S.C. section 360bbb-3(b)(1), unless the authorization is terminated or revoked.  Performed at [facility], [address] [place], [place] [address]          Radiology Studies: CT Head Wo Contrast  Result Date: 08/08/2020 CLINICAL DATA:  Neurologic deficit.  Evaluate for stroke. EXAM: CT HEAD WITHOUT CONTRAST TECHNIQUE: Contiguous axial images were obtained from the base of the skull through the vertex without intravenous contrast. COMPARISON:  04/22/2020 FINDINGS: Brain: No evidence of acute infarction, hemorrhage, hydrocephalus, extra-axial collection or mass lesion/mass effect. Vascular: No hyperdense vessel or unexpected calcification. Skull: Normal. Negative for fracture or focal lesion. Sinuses/Orbits: No acute finding. Other: None IMPRESSION: 1. No acute intracranial abnormalities.  Normal brain Electronically Signed   By: Kerby Moors M.D.   On: 08/08/2020 18:27   US RENAL  Result Date: 08/09/2020 CLINICAL DATA:  Acute kidney injury. EXAM: RENAL / URINARY TRACT ULTRASOUND COMPLETE COMPARISON:  Abdominopelvic CT 07/04/2020 FINDINGS: Right Kidney: Renal measurements: 10.2 x 4.3 x 5.2 cm = volume: 120 mL. There is cortical thinning and increased cortical echogenicity. No focal lesion or hydronephrosis identified. Left Kidney: Renal measurements: 9.1 x 5.3 x 4.4 cm = volume: 110 mL. There is cortical thinning and increased cortical echogenicity. No focal lesion or hydronephrosis identified. Bladder: Echogenic material is noted within the bladder lumen. No focal bladder wall abnormality identified. Other: No pelvic ascites.  Study limited by body habitus and bowel  gas. IMPRESSION: 1. Both kidneys are small with cortical thinning and increased cortical echogenicity consistent with "chronic medical renal disease". 2. No hydronephrosis or focal renal abnormality identified. 3. Echogenic urine suggesting possible cystitis or hematuria. Correlate clinically. Electronically Signed   By: Richardean Sale M.D.   On: 08/09/2020 14:17   DG Chest Portable 1 View  Result Date: 08/08/2020 CLINICAL DATA:  Cough, abdominal pain EXAM: PORTABLE CHEST 1 VIEW COMPARISON:  06/29/2020 FINDINGS: Nipple shadows overlying the bilateral lower lungs. Lungs are clear. No pleural effusion or pneumothorax. The heart is normal in size. IMPRESSION: No evidence of acute cardiopulmonary disease. Electronically Signed   By: Julian Hy M.D.   On: 08/08/2020 18:34        Scheduled Meds: . amitriptyline  25 mg Oral QHS  . aspirin EC  81 mg Oral Daily  . Brimonidine Tartrate  1 drop Both Eyes Daily  . enoxaparin (LOVENOX) injection  30 mg Subcutaneous Q24H  . famotidine  10 mg Oral QHS  . fluticasone  2 spray Each Nare BID  . gabapentin  300 mg Oral BID  . gabapentin  900 mg  Oral Q1400  . multivitamin with minerals  1 tablet Oral Daily  . oxybutynin  10 mg Oral Q1200  . pantoprazole  40 mg Oral Daily  . pentosan polysulfate  100 mg Oral QHS  . sodium citrate-citric acid  15 mL Oral BID PC  . tamsulosin  0.4 mg Oral QHS  . thiamine  100 mg Oral Daily   Continuous Infusions: . potassium chloride    . sodium bicarbonate 150 mEq in D5W infusion 150 mL/hr at 08/10/20 0412     LOS: 2 days    Time spent: 28 minutes    Sharen Hones, MD [facility]   To contact the attending provider between 7A-7P or the covering provider during after hours 7P-7A, please log into the web site [email or web address] and access using universal [place] password for that web site. If you do not have the password, please call the hospital operator.  08/10/2020, 10:25 AM

## 2020-08-10 NOTE — Plan of Care (Signed)
  Problem: Education: Goal: Knowledge of General Education information will improve Description: Including pain rating scale, medication(s)/side effects and non-pharmacologic comfort measures Outcome: Progressing   Problem: Health Behavior/Discharge Planning: Goal: Ability to manage health-related needs will improve Outcome: Progressing   Problem: Clinical Measurements: Goal: Ability to maintain clinical measurements within normal limits will improve Outcome: Progressing Goal: Will remain free from infection Outcome: Progressing Goal: Diagnostic test results will improve Outcome: Progressing Goal: Respiratory complications will improve Outcome: Progressing Goal: Cardiovascular complication will be avoided Outcome: Progressing   Problem: Activity: Goal: Risk for activity intolerance will decrease Outcome: Progressing   Problem: Nutrition: Goal: Adequate nutrition will be maintained Outcome: Progressing   Problem: Coping: Goal: Level of anxiety will decrease Outcome: Progressing   Problem: Elimination: Goal: Will not experience complications related to bowel motility Outcome: Progressing Goal: Will not experience complications related to urinary retention Outcome: Progressing   Problem: Pain Managment: Goal: General experience of comfort will improve Outcome: Progressing   Problem: Safety: Goal: Ability to remain free from injury will improve Outcome: Progressing   Problem: Skin Integrity: Goal: Risk for impaired skin integrity will decrease Outcome: Progressing   Problem: Education: Goal: Ability to demonstrate management of disease process will improve Outcome: Progressing Goal: Ability to verbalize understanding of medication therapies will improve Outcome: Progressing Goal: Individualized Educational Video(s) Outcome: Progressing   Problem: Activity: Goal: Capacity to carry out activities will improve Outcome: Progressing   Problem: Cardiac: Goal:  Ability to achieve and maintain adequate cardiopulmonary perfusion will improve Outcome: Progressing   Pt is AAOx4. Pt was medicated with tramadol for back. Pt verbalized relief. VSS. All needs attended. Bed alarm is on. Will continue to monitor.

## 2020-08-10 NOTE — Progress Notes (Signed)
[facility]  ROUNDING NOTE   Subjective:   [name] was admitted to [facility] on 08/08/2020 for Hypokalemia [E87.6] Hyponatremia [id number] Metabolic acidosis 99991111 Weakness [R53.1] Acute renal insufficiency [N28.9] AKI (acute kidney injury) ([place]) [N17.9]  Patient recently had an ERCP and stent placement for bile leak on 07/01/2020.   Patient has had generalized weakness and found to have acute kidney injury. She states she has had diarrhea for the a few days and decreased appetite for a couple days.   Patient is seen resting in bed Alert and oriented on exam She feels the liquid diet is making diarrhea worse, requesting to be advanced to solid foods Denies recent use of antibiotics   Objective:  Vital signs in last 24 hours:  Temp:  [97.7 F (36.5 C)] 97.7 F (36.5 C) (04/20 0816) Pulse Rate:  [80-90] 88 (04/20 0816) Resp:  [13-18] 17 (04/20 0816) BP: (105-140)/(61-87) 112/62 (04/20 0816) SpO2:  [98 %-100 %] 98 % (04/20 0816) Weight:  [53 kg] 53 kg (04/20 0930)  Weight change:  Filed Weights   08/08/20 1744 08/10/20 0930  Weight: 56.7 kg 53 kg    Intake/Output: I/O last 3 completed shifts: In: 7433.7 [I.V.:4760.1; IV Piggyback:2673.7] Out: 800 [Urine:800]   Intake/Output this shift:  Total I/O In: -  Out: 300 [Urine:300]  Physical Exam: General: NAD, laying in bed  Head: Normocephalic, atraumatic. Moist oral mucosal membranes  Eyes: Anicteric  Lungs:  Clear to auscultation  Heart: Regular rate and rhythm  Abdomen:  Soft, nontender  Extremities: no peripheral edema.  Neurologic: Nonfocal, moving all four extremities  Skin: No lesions  Access: none    Basic Metabolic Panel: Recent Labs  Lab 08/08/20 2118 08/09/20 0417 08/09/20 1448 08/09/20 2047 08/10/20 0519  NA 130* 131* 132* 133* 136  K 2.4* 2.1* 2.7* 3.1* 3.2*  CL 110 107 105 106 109  CO2 8* 11* 13* 16* 18*  GLUCOSE 89 115* 98 109* 100*  BUN 96* 91* 81* 78* 75*  CREATININE  3.39* 3.18* 2.90* 2.73* 2.58*  CALCIUM 9.6 9.2 8.8* 8.0* 7.6*  MG  --  1.9  --   --  1.8  PHOS  --   --   --   --  2.8    Liver Function Tests: Recent Labs  Lab 08/08/20 1816 08/10/20 0519  AST 12* 10*  ALT 11 8  ALKPHOS 128* 77  BILITOT 0.7 0.6  PROT 7.2 4.9*  ALBUMIN 3.8 2.5*   Recent Labs  Lab 08/08/20 1816  LIPASE 62*   Recent Labs  Lab 08/08/20 1816  AMMONIA 18    CBC: Recent Labs  Lab 08/08/20 1816 08/09/20 0417 08/10/20 0519  WBC 17.4* 13.6* 10.6*  NEUTROABS 12.6*  --   --   HGB 12.0 10.2* 9.1*  HCT 34.7* 29.7* 26.1*  MCV 89.9 89.2 88.2  PLT 473* 396 395    Cardiac Enzymes: No results for input(s): CKTOTAL, CKMB, CKMBINDEX, TROPONINI in the last 168 hours.  BNP: Invalid input(s): POCBNP  CBG: No results for input(s): GLUCAP in the last 168 hours.  Microbiology: Results for orders placed or performed during the hospital encounter of 08/08/20  Resp Panel by RT-PCR (Flu A&B, Covid) Nasopharyngeal Swab     Status: None   Collection Time: 08/08/20  7:33 PM   Specimen: Nasopharyngeal Swab; Nasopharyngeal(NP) swabs in vial transport medium  Result Value Ref Range Status   SARS Coronavirus 2 by RT PCR NEGATIVE NEGATIVE Final    Comment: (  NOTE) SARS-CoV-2 target nucleic acids are NOT DETECTED.  The SARS-CoV-2 RNA is generally detectable in upper respiratory specimens during the acute phase of infection. The lowest concentration of SARS-CoV-2 viral copies this assay can detect is 138 copies/mL. A negative result does not preclude SARS-Cov-2 infection and should not be used as the sole basis for treatment or other patient management decisions. A negative result may occur with  improper specimen collection/handling, submission of specimen other than nasopharyngeal swab, presence of viral mutation(s) within the areas targeted by this assay, and inadequate number of viral copies(<138 copies/mL). A negative result must be combined with clinical  observations, patient history, and epidemiological information. The expected result is Negative.  Fact Sheet for Patients:  EntrepreneurPulse.com.au  Fact Sheet for Healthcare Providers:  IncredibleEmployment.be  This test is no t yet approved or cleared by the [place] FDA and  has been authorized for detection and/or diagnosis of SARS-CoV-2 by FDA under an Emergency Use Authorization (EUA). This EUA will remain  in effect (meaning this test can be used) for the duration of the COVID-19 declaration under Section 564(b)(1) of the Act, 21 U.S.C.section 360bbb-3(b)(1), unless the authorization is terminated  or revoked sooner.       Influenza A by PCR NEGATIVE NEGATIVE Final   Influenza B by PCR NEGATIVE NEGATIVE Final    Comment: (NOTE) The Xpert Xpress SARS-CoV-2/FLU/RSV plus assay is intended as an aid in the diagnosis of influenza from Nasopharyngeal swab specimens and should not be used as a sole basis for treatment. Nasal washings and aspirates are unacceptable for Xpert Xpress SARS-CoV-2/FLU/RSV testing.  Fact Sheet for Patients: EntrepreneurPulse.com.au  Fact Sheet for Healthcare Providers: IncredibleEmployment.be  This test is not yet approved or cleared by the [place] FDA and has been authorized for detection and/or diagnosis of SARS-CoV-2 by FDA under an Emergency Use Authorization (EUA). This EUA will remain in effect (meaning this test can be used) for the duration of the COVID-19 declaration under Section 564(b)(1) of the Act, 21 U.S.C. section 360bbb-3(b)(1), unless the authorization is terminated or revoked.  Performed at [facility], [address] [place], [place] [address]     Coagulation Studies: No results for input(s): LABPROT, INR in the last 72 hours.  Urinalysis: Recent Labs    08/09/20 1142  COLORURINE YELLOW*  LABSPEC 1.005  PHURINE 6.0   GLUCOSEU NEGATIVE  HGBUR MODERATE*  BILIRUBINUR NEGATIVE  KETONESUR NEGATIVE  PROTEINUR NEGATIVE  NITRITE NEGATIVE  LEUKOCYTESUR LARGE*      Imaging: CT Head Wo Contrast  Result Date: 08/08/2020 CLINICAL DATA:  Neurologic deficit.  Evaluate for stroke. EXAM: CT HEAD WITHOUT CONTRAST TECHNIQUE: Contiguous axial images were obtained from the base of the skull through the vertex without intravenous contrast. COMPARISON:  04/22/2020 FINDINGS: Brain: No evidence of acute infarction, hemorrhage, hydrocephalus, extra-axial collection or mass lesion/mass effect. Vascular: No hyperdense vessel or unexpected calcification. Skull: Normal. Negative for fracture or focal lesion. Sinuses/Orbits: No acute finding. Other: None IMPRESSION: 1. No acute intracranial abnormalities.  Normal brain Electronically Signed   By: Kerby Moors M.D.   On: 08/08/2020 18:27   US RENAL  Result Date: 08/09/2020 CLINICAL DATA:  Acute kidney injury. EXAM: RENAL / URINARY TRACT ULTRASOUND COMPLETE COMPARISON:  Abdominopelvic CT 07/04/2020 FINDINGS: Right Kidney: Renal measurements: 10.2 x 4.3 x 5.2 cm = volume: 120 mL. There is cortical thinning and increased cortical echogenicity. No focal lesion or hydronephrosis identified. Left Kidney: Renal measurements: 9.1 x 5.3 x 4.4 cm = volume:  110 mL. There is cortical thinning and increased cortical echogenicity. No focal lesion or hydronephrosis identified. Bladder: Echogenic material is noted within the bladder lumen. No focal bladder wall abnormality identified. Other: No pelvic ascites.  Study limited by body habitus and bowel gas. IMPRESSION: 1. Both kidneys are small with cortical thinning and increased cortical echogenicity consistent with "chronic medical renal disease". 2. No hydronephrosis or focal renal abnormality identified. 3. Echogenic urine suggesting possible cystitis or hematuria. Correlate clinically. Electronically Signed   By: Richardean Sale M.D.   On: 08/09/2020  14:17   DG Chest Portable 1 View  Result Date: 08/08/2020 CLINICAL DATA:  Cough, abdominal pain EXAM: PORTABLE CHEST 1 VIEW COMPARISON:  06/29/2020 FINDINGS: Nipple shadows overlying the bilateral lower lungs. Lungs are clear. No pleural effusion or pneumothorax. The heart is normal in size. IMPRESSION: No evidence of acute cardiopulmonary disease. Electronically Signed   By: Julian Hy M.D.   On: 08/08/2020 18:34     Medications:   . sodium bicarbonate 150 mEq in D5W infusion 150 mL/hr at 08/10/20 0412   . amitriptyline  25 mg Oral QHS  . aspirin EC  81 mg Oral Daily  . Brimonidine Tartrate  1 drop Both Eyes Daily  . enoxaparin (LOVENOX) injection  30 mg Subcutaneous Q24H  . famotidine  10 mg Oral QHS  . fluticasone  2 spray Each Nare BID  . gabapentin  300 mg Oral BID  . gabapentin  900 mg Oral Q1400  . multivitamin with minerals  1 tablet Oral Daily  . oxybutynin  10 mg Oral Q1200  . pantoprazole  40 mg Oral Daily  . pentosan polysulfate  100 mg Oral QHS  . sodium citrate-citric acid  15 mL Oral BID PC  . tamsulosin  0.4 mg Oral QHS  . thiamine  100 mg Oral Daily   albuterol, traMADol  Assessment/ Plan:  [name] is a 50 y.o. 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 Acute kidney injury on chronic kidney disease IV: with history of bland urine: baseline creatinine 1.59, GFR of 40.  Acute kidney injury secondary to prerenal azotemia - Continue IV fluids - encourage oral nutrition  2. Metabolic acidosis: anion gap: secondary to volume loss - bicarb infusion  3. Hypokalemia:  - Oral Potassium replacement.    LOS: 2 Jaleesa Cervi 4/20/202210:03 AM

## 2020-08-11 LAB — VITAMIN B12: Vitamin B-12: 648 pg/mL (ref 180–914)

## 2020-08-11 LAB — CBC
HCT: 26 % — ABNORMAL LOW (ref 36.0–46.0)
Hemoglobin: 9 g/dL — ABNORMAL LOW (ref 12.0–15.0)
MCH: 31.4 pg (ref 26.0–34.0)
MCHC: 34.6 g/dL (ref 30.0–36.0)
MCV: 90.6 fL (ref 80.0–100.0)
Platelets: 386 10*3/uL (ref 150–400)
RBC: 2.87 MIL/uL — ABNORMAL LOW (ref 3.87–5.11)
RDW: 14 % (ref 11.5–15.5)
WBC: 8.4 10*3/uL (ref 4.0–10.5)
nRBC: 0 % (ref 0.0–0.2)

## 2020-08-11 LAB — IRON AND TIBC
Iron: 34 ug/dL (ref 28–170)
Saturation Ratios: 15 % (ref 10.4–31.8)
TIBC: 235 ug/dL — ABNORMAL LOW (ref 250–450)
UIBC: 201 ug/dL

## 2020-08-11 LAB — COMPREHENSIVE METABOLIC PANEL
ALT: 8 U/L (ref 0–44)
AST: 12 U/L — ABNORMAL LOW (ref 15–41)
Albumin: 2.3 g/dL — ABNORMAL LOW (ref 3.5–5.0)
Alkaline Phosphatase: 81 U/L (ref 38–126)
Anion gap: 11 (ref 5–15)
BUN: 59 mg/dL — ABNORMAL HIGH (ref 6–20)
CO2: 24 mmol/L (ref 22–32)
Calcium: 7.5 mg/dL — ABNORMAL LOW (ref 8.9–10.3)
Chloride: 98 mmol/L (ref 98–111)
Creatinine, Ser: 2.15 mg/dL — ABNORMAL HIGH (ref 0.44–1.00)
GFR, Estimated: 28 mL/min — ABNORMAL LOW (ref >60)
Glucose, Bld: 95 mg/dL (ref 70–99)
Potassium: 3 mmol/L — ABNORMAL LOW (ref 3.5–5.1)
Sodium: 133 mmol/L — ABNORMAL LOW (ref 135–145)
Total Bilirubin: 0.3 mg/dL (ref 0.3–1.2)
Total Protein: 4.7 g/dL — ABNORMAL LOW (ref 6.5–8.1)

## 2020-08-11 LAB — PHOSPHORUS: Phosphorus: 2.7 mg/dL (ref 2.5–4.6)

## 2020-08-11 LAB — POTASSIUM: Potassium: 3.2 mmol/L — ABNORMAL LOW (ref 3.5–5.1)

## 2020-08-11 LAB — MAGNESIUM: Magnesium: 1.7 mg/dL (ref 1.7–2.4)

## 2020-08-11 MED ORDER — POTASSIUM CHLORIDE 10 MEQ/100ML IV SOLN
10.0000 meq | INTRAVENOUS | Status: DC
  Filled 2020-08-11 (×4): qty 100

## 2020-08-11 MED ORDER — POTASSIUM CHLORIDE 10 MEQ/100ML IV SOLN
10.0000 meq | INTRAVENOUS | Status: AC
  Administered 2020-08-11 (×4): 10 meq via INTRAVENOUS
  Filled 2020-08-11: qty 100

## 2020-08-11 MED ORDER — POTASSIUM CHLORIDE 10 MEQ/100ML IV SOLN
10.0000 meq | INTRAVENOUS | Status: AC
  Administered 2020-08-11 (×3): 10 meq via INTRAVENOUS
  Filled 2020-08-11 (×3): qty 100

## 2020-08-11 NOTE — Progress Notes (Signed)
[facility]  ROUNDING NOTE   Subjective:   [name] was admitted to [facility] on 08/08/2020 for Hypokalemia [E87.6] Hyponatremia [id number] Metabolic acidosis 99991111 Weakness [R53.1] Acute renal insufficiency [N28.9] AKI (acute kidney injury) ([place]) [N17.9]  Patient recently had an ERCP and stent placement for bile leak on 07/01/2020.   Patient has had generalized weakness and found to have acute kidney injury. She states she has had diarrhea for the a few days and decreased appetite for a couple days.   Patient is seen resting in bed Alert and oriented  Says she feels much better today Denies diarrhea since yesterday Tolerating solid foods without nausea  Objective:  Vital signs in last 24 hours:  Temp:  [97.7 F (36.5 C)-99.2 F (37.3 C)] 98.9 F (37.2 C) (04/21 0913) Pulse Rate:  [83-88] 84 (04/21 0913) Resp:  [16-18] 18 (04/21 0913) BP: (98-129)/(57-82) 129/82 (04/21 0913) SpO2:  [96 %-99 %] 98 % (04/21 0913) Weight:  [55.3 kg] 55.3 kg (04/21 0441)  Weight change:  Filed Weights   08/08/20 1744 08/10/20 0930 08/11/20 0441  Weight: 56.7 kg 53 kg 55.3 kg    Intake/Output: I/O last 3 completed shifts: In: 6889.5 [P.O.:600; I.V.:6047.4; IV Piggyback:242.1] Out: 2601 [Urine:2601]   Intake/Output this shift:  Total I/O In: 240 [P.O.:240] Out: 500 [Urine:500]  Physical Exam: General: NAD, laying in bed  Head: Normocephalic, atraumatic. Moist oral mucosal membranes  Eyes: Anicteric  Lungs:  Clear to auscultation  Heart: Regular rate and rhythm  Abdomen:  Soft, tender  Extremities: no peripheral edema.  Neurologic: Nonfocal, moving all four extremities  Skin: No lesions  Access: none    Basic Metabolic Panel: Recent Labs  Lab 08/09/20 0417 08/09/20 1448 08/09/20 2047 08/10/20 0519 08/11/20 0501  NA 131* 132* 133* 136 133*  K 2.1* 2.7* 3.1* 3.2* 3.0*  CL 107 105 106 109 98  CO2 11* 13* 16* 18* 24  GLUCOSE 115* 98 109* 100* 95  BUN 91*  81* 78* 75* 59*  CREATININE 3.18* 2.90* 2.73* 2.58* 2.15*  CALCIUM 9.2 8.8* 8.0* 7.6* 7.5*  MG 1.9  --   --  1.8 1.7  PHOS  --   --   --  2.8 2.7    Liver Function Tests: Recent Labs  Lab 08/08/20 1816 08/10/20 0519 08/11/20 0501  AST 12* 10* 12*  ALT '11 8 8  '$ ALKPHOS 128* 77 81  BILITOT 0.7 0.6 0.3  PROT 7.2 4.9* 4.7*  ALBUMIN 3.8 2.5* 2.3*   Recent Labs  Lab 08/08/20 1816  LIPASE 62*   Recent Labs  Lab 08/08/20 1816  AMMONIA 18    CBC: Recent Labs  Lab 08/08/20 1816 08/09/20 0417 08/10/20 0519 08/11/20 0501  WBC 17.4* 13.6* 10.6* 8.4  NEUTROABS 12.6*  --   --   --   HGB 12.0 10.2* 9.1* 9.0*  HCT 34.7* 29.7* 26.1* 26.0*  MCV 89.9 89.2 88.2 90.6  PLT 473* 396 395 386    Cardiac Enzymes: No results for input(s): CKTOTAL, CKMB, CKMBINDEX, TROPONINI in the last 168 hours.  BNP: Invalid input(s): POCBNP  CBG: No results for input(s): GLUCAP in the last 168 hours.  Microbiology: Results for orders placed or performed during the hospital encounter of 08/08/20  Resp Panel by RT-PCR (Flu A&B, Covid) Nasopharyngeal Swab     Status: None   Collection Time: 08/08/20  7:33 PM   Specimen: Nasopharyngeal Swab; Nasopharyngeal(NP) swabs in vial transport medium  Result Value Ref Range Status  SARS Coronavirus 2 by RT PCR NEGATIVE NEGATIVE Final    Comment: (NOTE) SARS-CoV-2 target nucleic acids are NOT DETECTED.  The SARS-CoV-2 RNA is generally detectable in upper respiratory specimens during the acute phase of infection. The lowest concentration of SARS-CoV-2 viral copies this assay can detect is 138 copies/mL. A negative result does not preclude SARS-Cov-2 infection and should not be used as the sole basis for treatment or other patient management decisions. A negative result may occur with  improper specimen collection/handling, submission of specimen other than nasopharyngeal swab, presence of viral mutation(s) within the areas targeted by this assay, and  inadequate number of viral copies(<138 copies/mL). A negative result must be combined with clinical observations, patient history, and epidemiological information. The expected result is Negative.  Fact Sheet for Patients:  EntrepreneurPulse.com.au  Fact Sheet for Healthcare Providers:  IncredibleEmployment.be  This test is no t yet approved or cleared by the [place] FDA and  has been authorized for detection and/or diagnosis of SARS-CoV-2 by FDA under an Emergency Use Authorization (EUA). This EUA will remain  in effect (meaning this test can be used) for the duration of the COVID-19 declaration under Section 564(b)(1) of the Act, 21 U.S.C.section 360bbb-3(b)(1), unless the authorization is terminated  or revoked sooner.       Influenza A by PCR NEGATIVE NEGATIVE Final   Influenza B by PCR NEGATIVE NEGATIVE Final    Comment: (NOTE) The Xpert Xpress SARS-CoV-2/FLU/RSV plus assay is intended as an aid in the diagnosis of influenza from Nasopharyngeal swab specimens and should not be used as a sole basis for treatment. Nasal washings and aspirates are unacceptable for Xpert Xpress SARS-CoV-2/FLU/RSV testing.  Fact Sheet for Patients: EntrepreneurPulse.com.au  Fact Sheet for Healthcare Providers: IncredibleEmployment.be  This test is not yet approved or cleared by the [place] FDA and has been authorized for detection and/or diagnosis of SARS-CoV-2 by FDA under an Emergency Use Authorization (EUA). This EUA will remain in effect (meaning this test can be used) for the duration of the COVID-19 declaration under Section 564(b)(1) of the Act, 21 U.S.C. section 360bbb-3(b)(1), unless the authorization is terminated or revoked.  Performed at [facility], [address] [place], [place] [address]     Coagulation Studies: No results for input(s): LABPROT, INR in the last 72  hours.  Urinalysis: Recent Labs    08/09/20 1142  COLORURINE YELLOW*  LABSPEC 1.005  PHURINE 6.0  GLUCOSEU NEGATIVE  HGBUR MODERATE*  BILIRUBINUR NEGATIVE  KETONESUR NEGATIVE  PROTEINUR NEGATIVE  NITRITE NEGATIVE  LEUKOCYTESUR LARGE*      Imaging: US RENAL  Result Date: 08/09/2020 CLINICAL DATA:  Acute kidney injury. EXAM: RENAL / URINARY TRACT ULTRASOUND COMPLETE COMPARISON:  Abdominopelvic CT 07/04/2020 FINDINGS: Right Kidney: Renal measurements: 10.2 x 4.3 x 5.2 cm = volume: 120 mL. There is cortical thinning and increased cortical echogenicity. No focal lesion or hydronephrosis identified. Left Kidney: Renal measurements: 9.1 x 5.3 x 4.4 cm = volume: 110 mL. There is cortical thinning and increased cortical echogenicity. No focal lesion or hydronephrosis identified. Bladder: Echogenic material is noted within the bladder lumen. No focal bladder wall abnormality identified. Other: No pelvic ascites.  Study limited by body habitus and bowel gas. IMPRESSION: 1. Both kidneys are small with cortical thinning and increased cortical echogenicity consistent with "chronic medical renal disease". 2. No hydronephrosis or focal renal abnormality identified. 3. Echogenic urine suggesting possible cystitis or hematuria. Correlate clinically. Electronically Signed   By: Caryl Comes.D.  On: 08/09/2020 14:17     Medications:   . potassium chloride Stopped (08/11/20 0941)  . sodium bicarbonate 150 mEq in D5W infusion 150 mL/hr at 08/10/20 2341   . amitriptyline  25 mg Oral QHS  . aspirin EC  81 mg Oral Daily  . Brimonidine Tartrate  1 drop Both Eyes Daily  . enoxaparin (LOVENOX) injection  30 mg Subcutaneous Q24H  . famotidine  10 mg Oral QHS  . fluticasone  2 spray Each Nare BID  . gabapentin  300 mg Oral BID  . gabapentin  900 mg Oral Q1400  . multivitamin with minerals  1 tablet Oral Daily  . nicotine  21 mg Transdermal Daily  . oxybutynin  10 mg Oral Q1200  . pantoprazole  40  mg Oral Daily  . pentosan polysulfate  100 mg Oral QHS  . sodium citrate-citric acid  15 mL Oral BID PC  . tamsulosin  0.4 mg Oral QHS  . thiamine  100 mg Oral Daily   albuterol, traMADol  Assessment/ Plan:  [name] is a 50 y.o. 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 Acute kidney injury on chronic kidney disease IV: with history of bland urine: baseline creatinine 1.59, GFR of 40.  Acute kidney injury secondary to prerenal azotemia - Renal function improving - IVF discontinued  - tolerating meals   2. Metabolic acidosis: anion gap: secondary to volume loss - improving  3. Hypokalemia: Potassium 3.0 - Oral Potassium replacement.    LOS: 3 Yeila Morro 4/21/202211:40 AM

## 2020-08-11 NOTE — TOC Transition Note (Signed)
Transition of Care [facility]) - CM/SW Discharge Note   Patient Details  Name: Katherine Perez MRN: [id number] Date of Birth: [birth year]  Transition of Care [facility] CM/SW Contact:  Kerin Salen, RN Phone Number: 08/11/2020, 1:22 PM   Clinical Narrative: Spoke with patient briefly about Substance Abuse options, patient states she really feels she do not need help, denies having help before however did agree to the list of resources in the area. List placed on bedside table for patient to review.            Patient Goals and CMS Choice        Discharge Placement                       Discharge Plan and Services                                     Social Determinants of Health (SDOH) Interventions     Readmission Risk Interventions No flowsheet data found.

## 2020-08-11 NOTE — Progress Notes (Signed)
PROGRESS NOTE    Katherine Perez  [id number] DOB: 1970-11-18 DOA: 08/08/2020 PCP: Gladstone Lighter, MD   Chief complaint.  Generalized weakness. Brief Narrative:  50 y.o.femalewith medical history significant foreating disorder/laxative abuse, chronic kidney disease stage IIIa (baseline Scr of 1.5),recurrent UTIrecurrent UTI, incompletebladder emptying, anxietydisorder, insomnia,neuropathyand history of statusrecent ERCP and stent placementin 06/2020.Shepresented to the emergency departmentaccompanied by her husband with complaints of generalized weakness over the past 4 days. She also had significant diarrhea for 2 days.Patient was found to have acute kidney injury with severe hyperkalemia. She was hospitalized for further management. She was also noted to have severe metabolic acidosis and placed on bicarbonate infusion.    Assessment & Plan:   Active Problems:   Hyponatremia   Hypokalemia   Metabolic acidosis   AKI (acute kidney injury) ([place])  #1.  Acute kidney injury secondary to dehydration. Severe metabolic acidosis secondary to acute kidney injury. Hypokalemia Hyponatremia secondary to hypovolemia. Gastroenteritis. Patient condition improving, good appetite, no additional diarrhea.  Renal function also improving, will continue fluids for another day.  Anticipating discharge tomorrow with improved renal function. Continue supplement potassium for hypokalemia.  2.  Anemia. Check iron B12 level.  3.  Acute metabolic encephalopathy  Condition improved.  DVT prophylaxis: Lovenox Code Status: Full Family Communication:  Disposition Plan:  .   Status is: Inpatient  Remains inpatient appropriate because:Inpatient level of care appropriate due to severity of illness   Dispo:  Patient From: Home  Planned Disposition: Home  Medically stable for discharge: No          I/O last 3 completed shifts: In: 6889.5 [P.O.:600; I.V.:6047.4; IV  Piggyback:242.1] Out: 2601 [Urine:2601] Total I/O In: 240 [P.O.:240] Out: 500 [Urine:500]     Consultants:   None  Procedures: None  Antimicrobials: None  Subjective: Patient condition much improved, no additional diarrhea.  Good appetite, no nausea vomiting. No fever chills  No dysuria hematuria  Not short of breath or cough.  Objective: Vitals:   08/10/20 2135 08/11/20 0425 08/11/20 0441 08/11/20 0913  BP: 105/70 (!) 98/57 114/66 129/82  Pulse: 83 87 87 84  Resp: '18 16 18 18  '$ Temp: 98.9 F (37.2 C) 98.3 F (36.8 C) 99.2 F (37.3 C) 98.9 F (37.2 C)  TempSrc: Oral  Oral Oral  SpO2: 99% 96% 98% 98%  Weight:   55.3 kg   Height:        Intake/Output Summary (Last 24 hours) at 08/11/2020 1356 Last data filed at 08/11/2020 1107 Gross per 24 hour  Intake 1790.4 ml  Output 1900 ml  Net -109.6 ml   Filed Weights   08/08/20 1744 08/10/20 0930 08/11/20 0441  Weight: 56.7 kg 53 kg 55.3 kg    Examination:  General exam: Appears calm and comfortable  Respiratory system: Clear to auscultation. Respiratory effort normal. Cardiovascular system: S1 & S2 heard, RRR. No JVD, murmurs, rubs, gallops or clicks. No pedal edema. Gastrointestinal system: Abdomen is nondistended, soft and nontender. No organomegaly or masses felt. Normal bowel sounds heard. Central nervous system: Alert and oriented. No focal neurological deficits. Extremities: Symmetric 5 x 5 power. Skin: No rashes, lesions or ulcers Psychiatry: Judgement and insight appear normal. Mood & affect appropriate.     Data Reviewed: I have personally reviewed following labs and imaging studies  CBC: Recent Labs  Lab 08/08/20 1816 08/09/20 0417 08/10/20 0519 08/11/20 0501  WBC 17.4* 13.6* 10.6* 8.4  NEUTROABS 12.6*  --   --   --   HGB  12.0 10.2* 9.1* 9.0*  HCT 34.7* 29.7* 26.1* 26.0*  MCV 89.9 89.2 88.2 90.6  PLT 473* 396 395 [id number]   Basic Metabolic Panel: Recent Labs  Lab 08/09/20 0417 08/09/20 1448  08/09/20 2047 08/10/20 0519 08/11/20 0501  NA 131* 132* 133* 136 133*  K 2.1* 2.7* 3.1* 3.2* 3.0*  CL 107 105 106 109 98  CO2 11* 13* 16* 18* 24  GLUCOSE 115* 98 109* 100* 95  BUN 91* 81* 78* 75* 59*  CREATININE 3.18* 2.90* 2.73* 2.58* 2.15*  CALCIUM 9.2 8.8* 8.0* 7.6* 7.5*  MG 1.9  --   --  1.8 1.7  PHOS  --   --   --  2.8 2.7   GFR: Estimated Creatinine Clearance: 27.9 mL/min (A) (by C-G formula based on SCr of 2.15 mg/dL (H)). Liver Function Tests: Recent Labs  Lab 08/08/20 1816 08/10/20 0519 08/11/20 0501  AST 12* 10* 12*  ALT '11 8 8  '$ ALKPHOS 128* 77 81  BILITOT 0.7 0.6 0.3  PROT 7.2 4.9* 4.7*  ALBUMIN 3.8 2.5* 2.3*   Recent Labs  Lab 08/08/20 1816  LIPASE 62*   Recent Labs  Lab 08/08/20 1816  AMMONIA 18   Coagulation Profile: No results for input(s): INR, PROTIME in the last 168 hours. Cardiac Enzymes: No results for input(s): CKTOTAL, CKMB, CKMBINDEX, TROPONINI in the last 168 hours. BNP (last 3 results) No results for input(s): PROBNP in the last 8760 hours. HbA1C: No results for input(s): HGBA1C in the last 72 hours. CBG: No results for input(s): GLUCAP in the last 168 hours. Lipid Profile: No results for input(s): CHOL, HDL, LDLCALC, TRIG, CHOLHDL, LDLDIRECT in the last 72 hours. Thyroid Function Tests: Recent Labs    08/08/20 2118  TSH 3.330   Anemia Panel: No results for input(s): VITAMINB12, FOLATE, FERRITIN, TIBC, IRON, RETICCTPCT in the last 72 hours. Sepsis Labs: Recent Labs  Lab 08/08/20 2118 08/09/20 0417  LATICACIDVEN 0.6 0.8    Recent Results (from the past 240 hour(s))  Resp Panel by RT-PCR (Flu A&B, Covid) Nasopharyngeal Swab     Status: None   Collection Time: 08/08/20  7:33 PM   Specimen: Nasopharyngeal Swab; Nasopharyngeal(NP) swabs in vial transport medium  Result Value Ref Range Status   SARS Coronavirus 2 by RT PCR NEGATIVE NEGATIVE Final    Comment: (NOTE) SARS-CoV-2 target nucleic acids are NOT DETECTED.  The  SARS-CoV-2 RNA is generally detectable in upper respiratory specimens during the acute phase of infection. The lowest concentration of SARS-CoV-2 viral copies this assay can detect is 138 copies/mL. A negative result does not preclude SARS-Cov-2 infection and should not be used as the sole basis for treatment or other patient management decisions. A negative result may occur with  improper specimen collection/handling, submission of specimen other than nasopharyngeal swab, presence of viral mutation(s) within the areas targeted by this assay, and inadequate number of viral copies(<138 copies/mL). A negative result must be combined with clinical observations, patient history, and epidemiological information. The expected result is Negative.  Fact Sheet for Patients:  EntrepreneurPulse.com.au  Fact Sheet for Healthcare Providers:  IncredibleEmployment.be  This test is no t yet approved or cleared by the [place] FDA and  has been authorized for detection and/or diagnosis of SARS-CoV-2 by FDA under an Emergency Use Authorization (EUA). This EUA will remain  in effect (meaning this test can be used) for the duration of the COVID-19 declaration under Section 564(b)(1) of the Act, 21 U.S.C.section 360bbb-3(b)(1), unless the authorization  is terminated  or revoked sooner.       Influenza A by PCR NEGATIVE NEGATIVE Final   Influenza B by PCR NEGATIVE NEGATIVE Final    Comment: (NOTE) The Xpert Xpress SARS-CoV-2/FLU/RSV plus assay is intended as an aid in the diagnosis of influenza from Nasopharyngeal swab specimens and should not be used as a sole basis for treatment. Nasal washings and aspirates are unacceptable for Xpert Xpress SARS-CoV-2/FLU/RSV testing.  Fact Sheet for Patients: EntrepreneurPulse.com.au  Fact Sheet for Healthcare Providers: IncredibleEmployment.be  This test is not yet approved or  cleared by the [place] FDA and has been authorized for detection and/or diagnosis of SARS-CoV-2 by FDA under an Emergency Use Authorization (EUA). This EUA will remain in effect (meaning this test can be used) for the duration of the COVID-19 declaration under Section 564(b)(1) of the Act, 21 U.S.C. section 360bbb-3(b)(1), unless the authorization is terminated or revoked.  Performed at [facility], [address] [place], [place] [address]          Radiology Studies: No results found.      Scheduled Meds: . amitriptyline  25 mg Oral QHS  . aspirin EC  81 mg Oral Daily  . Brimonidine Tartrate  1 drop Both Eyes Daily  . enoxaparin (LOVENOX) injection  30 mg Subcutaneous Q24H  . famotidine  10 mg Oral QHS  . fluticasone  2 spray Each Nare BID  . gabapentin  300 mg Oral BID  . gabapentin  900 mg Oral Q1400  . multivitamin with minerals  1 tablet Oral Daily  . nicotine  21 mg Transdermal Daily  . oxybutynin  10 mg Oral Q1200  . pantoprazole  40 mg Oral Daily  . pentosan polysulfate  100 mg Oral QHS  . sodium citrate-citric acid  15 mL Oral BID PC  . tamsulosin  0.4 mg Oral QHS  . thiamine  100 mg Oral Daily   Continuous Infusions: . potassium chloride 10 mEq (08/11/20 1351)  . sodium bicarbonate 150 mEq in D5W infusion 150 mL/hr at 08/10/20 2341     LOS: 3 days    Time spent: 28 minutes    Sharen Hones, MD [facility]   To contact the attending provider between 7A-7P or the covering provider during after hours 7P-7A, please log into the web site [email or web address] and access using universal [place] password for that web site. If you do not have the password, please call the hospital operator.  08/11/2020, 1:56 PM

## 2020-08-12 LAB — COMPREHENSIVE METABOLIC PANEL
ALT: 12 U/L (ref 0–44)
AST: 18 U/L (ref 15–41)
Albumin: 2.6 g/dL — ABNORMAL LOW (ref 3.5–5.0)
Alkaline Phosphatase: 91 U/L (ref 38–126)
Anion gap: 10 (ref 5–15)
BUN: 49 mg/dL — ABNORMAL HIGH (ref 6–20)
CO2: 29 mmol/L (ref 22–32)
Calcium: 7.5 mg/dL — ABNORMAL LOW (ref 8.9–10.3)
Chloride: 93 mmol/L — ABNORMAL LOW (ref 98–111)
Creatinine, Ser: 1.99 mg/dL — ABNORMAL HIGH (ref 0.44–1.00)
GFR, Estimated: 30 mL/min — ABNORMAL LOW (ref >60)
Glucose, Bld: 97 mg/dL (ref 70–99)
Potassium: 3.8 mmol/L (ref 3.5–5.1)
Sodium: 132 mmol/L — ABNORMAL LOW (ref 135–145)
Total Bilirubin: 0.5 mg/dL (ref 0.3–1.2)
Total Protein: 5.4 g/dL — ABNORMAL LOW (ref 6.5–8.1)

## 2020-08-12 LAB — CBC
HCT: 31.2 % — ABNORMAL LOW (ref 36.0–46.0)
Hemoglobin: 10.3 g/dL — ABNORMAL LOW (ref 12.0–15.0)
MCH: 30.5 pg (ref 26.0–34.0)
MCHC: 33 g/dL (ref 30.0–36.0)
MCV: 92.3 fL (ref 80.0–100.0)
Platelets: 393 10*3/uL (ref 150–400)
RBC: 3.38 MIL/uL — ABNORMAL LOW (ref 3.87–5.11)
RDW: 14 % (ref 11.5–15.5)
WBC: 9.5 10*3/uL (ref 4.0–10.5)
nRBC: 0 % (ref 0.0–0.2)

## 2020-08-12 LAB — PHOSPHORUS: Phosphorus: 3 mg/dL (ref 2.5–4.6)

## 2020-08-12 LAB — BASIC METABOLIC PANEL
Anion gap: 10 (ref 5–15)
BUN: 43 mg/dL — ABNORMAL HIGH (ref 6–20)
CO2: 30 mmol/L (ref 22–32)
Calcium: 7.6 mg/dL — ABNORMAL LOW (ref 8.9–10.3)
Chloride: 92 mmol/L — ABNORMAL LOW (ref 98–111)
Creatinine, Ser: 2.01 mg/dL — ABNORMAL HIGH (ref 0.44–1.00)
GFR, Estimated: 30 mL/min — ABNORMAL LOW (ref >60)
Glucose, Bld: 141 mg/dL — ABNORMAL HIGH (ref 70–99)
Potassium: 4 mmol/L (ref 3.5–5.1)
Sodium: 132 mmol/L — ABNORMAL LOW (ref 135–145)

## 2020-08-12 LAB — MAGNESIUM: Magnesium: 1.6 mg/dL — ABNORMAL LOW (ref 1.7–2.4)

## 2020-08-12 MED ORDER — GABAPENTIN 300 MG PO CAPS: 300.0000 mg | ORAL_CAPSULE | Freq: Two times a day (BID) | ORAL | 0 refills | Status: AC

## 2020-08-12 MED ORDER — MAGNESIUM SULFATE 2 GM/50ML IV SOLN
2.0000 g | Freq: Once | INTRAVENOUS | Status: AC
  Administered 2020-08-12: 2 g via INTRAVENOUS
  Filled 2020-08-12: qty 50

## 2020-08-12 NOTE — Progress Notes (Signed)
Monitors called.  Pt had 6 sec SVT up to 160.  Back to SR.  Pt resting in bed asymptomatic.  MD notified via secure chat.

## 2020-08-12 NOTE — Discharge Summary (Signed)
Physician Discharge Summary  Patient ID: Katherine Perez MRN: [id number] DOB/AGE: 1970-12-25 50 y.o.  Admit date: 08/08/2020 Discharge date: 08/12/2020  Admission Diagnoses:  Discharge Diagnoses:  Active Problems:   Hyponatremia   Hypokalemia   Metabolic acidosis   AKI (acute kidney injury) [facility])   Discharged Condition: good  Hospital Course:  50 y.o.femalewith medical history significant foreating disorder/laxative abuse, chronic kidney disease stage IIIa (baseline Scr of 1.5),recurrent UTIrecurrent UTI, incompletebladder emptying, anxietydisorder, insomnia,neuropathyand history of statusrecent ERCP and stent placementin 06/2020.Shepresented to the emergency departmentaccompanied by her husband with complaints of generalized weakness over the past 4 days.She also had significant diarrhea for 2 days.Patient was found to have acute kidney injury with severe hyperkalemia. She was hospitalized for further management. She was also noted to have severe metabolic acidosis and placed on bicarbonate infusion.  #1. Acute kidney injury on CKD stage 3a secondary to dehydration. Severe metabolic acidosis secondary to acute kidney injury. Hypokalemia Hyponatremia secondary to hypovolemia. Gastroenteritis. Patient was given sodium bicarb drip, patient also seen by nephrology.  Recommendation was disease thought due to severe dehydration.  Her condition gradually improved.  She feels better.  Renal function has been coming down and become steady at 2.0.  This could be her new baseline.  Her prior creatinine was 1.6-1.7. Discussed with Dr. Juleen China, patient can be discharged from his standpoint.  He will need a follow-up with Dr. Holley Raring as outpatient.  2.  Anemia of chronic disease Patient has adequate iron B12 level.  Hemoglobin stable.  3.  Acute metabolic encephalopathy  Condition improved.   Consults: nephrology  Significant Diagnostic Studies:  RENAL / URINARY TRACT  ULTRASOUND COMPLETE  COMPARISON:  Abdominopelvic CT 07/04/2020  FINDINGS: Right Kidney:  Renal measurements: 10.2 x 4.3 x 5.2 cm = volume: 120 mL. There is cortical thinning and increased cortical echogenicity. No focal lesion or hydronephrosis identified.  Left Kidney:  Renal measurements: 9.1 x 5.3 x 4.4 cm = volume: 110 mL. There is cortical thinning and increased cortical echogenicity. No focal lesion or hydronephrosis identified.  Bladder:  Echogenic material is noted within the bladder lumen. No focal bladder wall abnormality identified.  Other:  No pelvic ascites.  Study limited by body habitus and bowel gas.  IMPRESSION: 1. Both kidneys are small with cortical thinning and increased cortical echogenicity consistent with "chronic medical renal disease". 2. No hydronephrosis or focal renal abnormality identified. 3. Echogenic urine suggesting possible cystitis or hematuria. Correlate clinically.   Electronically Signed   By: Richardean Sale M.D.   On: 08/09/2020 14:17     Treatments: IVF  Discharge Exam: Blood pressure 120/77, pulse 80, temperature 98.7 F (37.1 C), temperature source Oral, resp. rate 17, height '5\' 9"'$  (1.753 m), weight 55.3 kg, SpO2 99 %. General appearance: alert and cooperative Resp: clear to auscultation bilaterally Cardio: regular rate and rhythm, S1, S2 normal, no murmur, click, rub or gallop GI: soft, non-tender; bowel sounds normal; no masses,  no organomegaly Extremities: extremities normal, atraumatic, no cyanosis or edema  Disposition: Discharge disposition: 01-Home or Self Care       Discharge Instructions    Diet - low sodium heart healthy   Complete by: As directed    Increase activity slowly   Complete by: As directed      Allergies as of 08/12/2020      Reactions   Cetirizine Hives   Diazepam Other (See Comments)   Depression   Baclofen Anxiety, Other (See Comments)   AMS - "Really messed  me up,  caused me to drop things"      Medication List    STOP taking these medications   Advil Dual Action 125-250 MG Tabs Generic drug: Ibuprofen-Acetaminophen   fosfomycin 3 g Pack Commonly known as: MONUROL   RABEprazole 20 MG tablet Commonly known as: ACIPHEX   trimethoprim 100 MG tablet Commonly known as: TRIMPEX     TAKE these medications   albuterol 108 (90 Base) MCG/ACT inhaler Commonly known as: VENTOLIN HFA Inhale 2 puffs into the lungs every 4 (four) hours as needed for shortness of breath or wheezing.   amitriptyline 25 MG tablet Commonly known as: ELAVIL Take 25-50 mg by mouth at bedtime.   Brimonidine Tartrate 0.025 % Soln Place 1 drop into both eyes daily.   doxepin 25 MG capsule Commonly known as: SINEQUAN Take 25-50 mg by mouth at bedtime.   Dulcolax 5 MG EC tablet Generic drug: bisacodyl Take 2 tablets (10 mg total) by mouth at bedtime as needed for moderate constipation.   famotidine 40 MG tablet Commonly known as: PEPCID Take 40 mg by mouth at bedtime.   fexofenadine 180 MG tablet Commonly known as: ALLEGRA Take 180 mg by mouth daily as needed for allergies.   FIBER PO Take 1 capsule by mouth daily.   fluocinonide cream 0.05 % Commonly known as: LIDEX Apply 1 application topically 2 (two) times daily.   fluticasone 50 MCG/ACT nasal spray Commonly known as: FLONASE Place 2 sprays into both nostrils 2 (two) times daily.   Fluticasone-Salmeterol 250-50 MCG/DOSE Aepb Commonly known as: ADVAIR Inhale 1 puff into the lungs 2 (two) times daily.   gabapentin 300 MG capsule Commonly known as: NEURONTIN Take 1 capsule (300 mg total) by mouth 2 (two) times daily. What changed: when to take this   hydrOXYzine 25 MG tablet Commonly known as: ATARAX/VISTARIL Take 25-50 mg by mouth at bedtime.   iron polysaccharides 150 MG capsule Commonly known as: NIFEREX Take 150 mg by mouth daily.   Klor-Con M20 20 MEQ tablet Generic drug: potassium  chloride SA Take 20 mEq by mouth 2 (two) times daily.   medroxyPROGESTERone 150 MG/ML injection Commonly known as: DEPO-PROVERA Inject 150 mg into the muscle every 3 (three) months.   melatonin 3 MG Tabs tablet Take 2 tablets (6 mg total) by mouth at bedtime.   metoCLOPramide 10 MG tablet Commonly known as: REGLAN Take 10 mg by mouth in the morning and at bedtime.   omeprazole 40 MG capsule Commonly known as: PRILOSEC Take 1 capsule by mouth daily.   ondansetron 8 MG tablet Commonly known as: ZOFRAN Take 8 mg by mouth every 8 (eight) hours as needed for vomiting or nausea.   oxybutynin 10 MG 24 hr tablet Commonly known as: DITROPAN-XL Take 10 mg by mouth daily at 12 noon.   pentosan polysulfate 100 MG capsule Commonly known as: ELMIRON Take 100 mg by mouth at bedtime.   polyethylene glycol 17 g packet Commonly known as: MiraLax Take 17 g by mouth daily.   promethazine 25 MG tablet Commonly known as: PHENERGAN Take 25 mg by mouth every 8 (eight) hours as needed for nausea or vomiting.   sodium citrate-citric acid 500-334 MG/5ML solution Commonly known as: ORACIT Take 15 mLs by mouth in the morning and at bedtime.   tamsulosin 0.4 MG Caps capsule Commonly known as: FLOMAX Take 0.4 mg by mouth at bedtime.   theophylline 400 MG 24 hr tablet Commonly known as: UNIPHYL Take 400 mg  by mouth 2 (two) times daily.   traMADol 50 MG tablet Commonly known as: ULTRAM Take 50 mg by mouth every 6 (six) hours as needed for moderate pain.   Trulance 3 MG Tabs Generic drug: Plecanatide Take 3 mg by mouth daily at 6 (six) AM.   Uribel 118 MG Caps Take 1 capsule by mouth daily.   Vitamin D 125 MCG (5000 UT) Caps Take 5,000 Units by mouth daily.       Follow-up Information    Gladstone Lighter, MD Follow up in 1 week(s).   Specialty: Internal Medicine Contact information: [place] [address] [phone]        Anthonette Legato, MD Follow  up in 1 week(s).   Specialty: Nephrology Contact information: [place] [address] [phone]              32 minutes Signed: Sharen Hones 08/12/2020, 1:41 PM

## 2020-08-12 NOTE — Progress Notes (Signed)
[facility]  ROUNDING NOTE   Subjective:   [name] was admitted to [facility] on 08/08/2020 for Hypokalemia [E87.6] Hyponatremia [id number] Metabolic acidosis 99991111 Weakness [R53.1] Acute renal insufficiency [N28.9] AKI (acute kidney injury) ([place]) [N17.9]  Patient recently had an ERCP and stent placement for bile leak on 07/01/2020.   Patient has had generalized weakness and found to have acute kidney injury. She states she has had diarrhea for the a few days and decreased appetite for a couple days.   Patient is seen sitting at the side of bed Alert and oriented Denies diarrhea, nausea or vomiting Complains of stomach soreness Appetite improving  Objective:  Vital signs in last 24 hours:  Temp:  [98.7 F (37.1 C)] 98.7 F (37.1 C) (04/22 0534) Pulse Rate:  [80-86] 80 (04/22 0534) Resp:  [17-18] 17 (04/22 0534) BP: (119-120)/(75-77) 120/77 (04/22 0534) SpO2:  [98 %-99 %] 99 % (04/22 0534)  Weight change:  Filed Weights   08/08/20 1744 08/10/20 0930 08/11/20 0441  Weight: 56.7 kg 53 kg 55.3 kg    Intake/Output: I/O last 3 completed shifts: In: 240 [P.O.:240] Out: 4700 [Urine:4700]   Intake/Output this shift:  No intake/output data recorded.  Physical Exam: General: NAD, sitting on side of bed  Head: Normocephalic, atraumatic. Moist oral mucosal membranes  Eyes: Anicteric  Lungs:  Clear to auscultation  Heart: Regular rate and rhythm  Abdomen:  Soft, tender  Extremities: no peripheral edema.  Neurologic: Nonfocal, moving all four extremities  Skin: No lesions  Access: none    Basic Metabolic Panel: Recent Labs  Lab 08/09/20 0417 08/09/20 1448 08/09/20 2047 08/10/20 0519 08/11/20 0501 08/11/20 1355 08/12/20 0457 08/12/20 1306  NA 131*   < > 133* 136 133*  --  132* 132*  K 2.1*   < > 3.1* 3.2* 3.0* 3.2* 3.8 4.0  CL 107   < > 106 109 98  --  93* 92*  CO2 11*   < > 16* 18* 24  --  29 30  GLUCOSE 115*   < > 109* 100* 95  --  97 141*   BUN 91*   < > 78* 75* 59*  --  49* 43*  CREATININE 3.18*   < > 2.73* 2.58* 2.15*  --  1.99* 2.01*  CALCIUM 9.2   < > 8.0* 7.6* 7.5*  --  7.5* 7.6*  MG 1.9  --   --  1.8 1.7  --  1.6*  --   PHOS  --   --   --  2.8 2.7  --  3.0  --    < > = values in this interval not displayed.    Liver Function Tests: Recent Labs  Lab 08/08/20 1816 08/10/20 0519 08/11/20 0501 08/12/20 0457  AST 12* 10* 12* 18  ALT '11 8 8 12  '$ ALKPHOS 128* 77 81 91  BILITOT 0.7 0.6 0.3 0.5  PROT 7.2 4.9* 4.7* 5.4*  ALBUMIN 3.8 2.5* 2.3* 2.6*   Recent Labs  Lab 08/08/20 1816  LIPASE 62*   Recent Labs  Lab 08/08/20 1816  AMMONIA 18    CBC: Recent Labs  Lab 08/08/20 1816 08/09/20 0417 08/10/20 0519 08/11/20 0501 08/12/20 0457  WBC 17.4* 13.6* 10.6* 8.4 9.5  NEUTROABS 12.6*  --   --   --   --   HGB 12.0 10.2* 9.1* 9.0* 10.3*  HCT 34.7* 29.7* 26.1* 26.0* 31.2*  MCV 89.9 89.2 88.2 90.6 92.3  PLT 473* 396 395  386 393    Cardiac Enzymes: No results for input(s): CKTOTAL, CKMB, CKMBINDEX, TROPONINI in the last 168 hours.  BNP: Invalid input(s): POCBNP  CBG: No results for input(s): GLUCAP in the last 168 hours.  Microbiology: Results for orders placed or performed during the hospital encounter of 08/08/20  Resp Panel by RT-PCR (Flu A&B, Covid) Nasopharyngeal Swab     Status: None   Collection Time: 08/08/20  7:33 PM   Specimen: Nasopharyngeal Swab; Nasopharyngeal(NP) swabs in vial transport medium  Result Value Ref Range Status   SARS Coronavirus 2 by RT PCR NEGATIVE NEGATIVE Final    Comment: (NOTE) SARS-CoV-2 target nucleic acids are NOT DETECTED.  The SARS-CoV-2 RNA is generally detectable in upper respiratory specimens during the acute phase of infection. The lowest concentration of SARS-CoV-2 viral copies this assay can detect is 138 copies/mL. A negative result does not preclude SARS-Cov-2 infection and should not be used as the sole basis for treatment or other patient management  decisions. A negative result may occur with  improper specimen collection/handling, submission of specimen other than nasopharyngeal swab, presence of viral mutation(s) within the areas targeted by this assay, and inadequate number of viral copies(<138 copies/mL). A negative result must be combined with clinical observations, patient history, and epidemiological information. The expected result is Negative.  Fact Sheet for Patients:  EntrepreneurPulse.com.au  Fact Sheet for Healthcare Providers:  IncredibleEmployment.be  This test is no t yet approved or cleared by the [place] FDA and  has been authorized for detection and/or diagnosis of SARS-CoV-2 by FDA under an Emergency Use Authorization (EUA). This EUA will remain  in effect (meaning this test can be used) for the duration of the COVID-19 declaration under Section 564(b)(1) of the Act, 21 U.S.C.section 360bbb-3(b)(1), unless the authorization is terminated  or revoked sooner.       Influenza A by PCR NEGATIVE NEGATIVE Final   Influenza B by PCR NEGATIVE NEGATIVE Final    Comment: (NOTE) The Xpert Xpress SARS-CoV-2/FLU/RSV plus assay is intended as an aid in the diagnosis of influenza from Nasopharyngeal swab specimens and should not be used as a sole basis for treatment. Nasal washings and aspirates are unacceptable for Xpert Xpress SARS-CoV-2/FLU/RSV testing.  Fact Sheet for Patients: EntrepreneurPulse.com.au  Fact Sheet for Healthcare Providers: IncredibleEmployment.be  This test is not yet approved or cleared by the [place] FDA and has been authorized for detection and/or diagnosis of SARS-CoV-2 by FDA under an Emergency Use Authorization (EUA). This EUA will remain in effect (meaning this test can be used) for the duration of the COVID-19 declaration under Section 564(b)(1) of the Act, 21 U.S.C. section 360bbb-3(b)(1), unless the  authorization is terminated or revoked.  Performed at [facility], [address] [place], [place] [address]     Coagulation Studies: No results for input(s): LABPROT, INR in the last 72 hours.  Urinalysis: No results for input(s): COLORURINE, LABSPEC, PHURINE, GLUCOSEU, HGBUR, BILIRUBINUR, KETONESUR, PROTEINUR, UROBILINOGEN, NITRITE, LEUKOCYTESUR in the last 72 hours.  Invalid input(s): APPERANCEUR    Imaging: No results found.   Medications:   . sodium bicarbonate 150 mEq in D5W infusion 150 mL/hr at 08/12/20 0116   . amitriptyline  25 mg Oral QHS  . aspirin EC  81 mg Oral Daily  . enoxaparin (LOVENOX) injection  30 mg Subcutaneous Q24H  . famotidine  10 mg Oral QHS  . fluticasone  2 spray Each Nare BID  . gabapentin  300 mg Oral BID  . gabapentin  900 mg Oral Q1400  . multivitamin with minerals  1 tablet Oral Daily  . nicotine  21 mg Transdermal Daily  . oxybutynin  10 mg Oral Q1200  . pantoprazole  40 mg Oral Daily  . pentosan polysulfate  100 mg Oral QHS  . sodium citrate-citric acid  15 mL Oral BID PC  . tamsulosin  0.4 mg Oral QHS  . thiamine  100 mg Oral Daily   albuterol, traMADol  Assessment/ Plan:  [name] is a 50 y.o. 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) [N17.9]  1. Acute kidney injury on chronic kidney disease IV: with history of bland urine: baseline creatinine 1.59, GFR of 40.  Acute kidney injury secondary to prerenal azotemia - Renal function continues to improve - tolerating meals  - will follow up with Nephrology at discharge  2. Metabolic acidosis: anion gap: secondary to volume loss - improving  3. Hypokalemia: Potassium 4.0 - resolved   LOS: 4 Angee Gupton 4/22/20222:00 PM

## 2020-08-12 NOTE — Plan of Care (Signed)

## 2020-10-05 ENCOUNTER — Telehealth: Payer: Self-pay

## 2020-10-05 NOTE — Telephone Encounter (Signed)
Left vm for pt to return my call to schedule repeat ERCP for stent removal.

## 2020-10-07 ENCOUNTER — Other Ambulatory Visit: Payer: Self-pay

## 2020-10-07 ENCOUNTER — Encounter: Payer: Self-pay | Admitting: Oncology

## 2020-10-07 ENCOUNTER — Inpatient Hospital Stay: Payer: BC Managed Care – PPO | Attending: Oncology | Admitting: Oncology

## 2020-10-07 ENCOUNTER — Inpatient Hospital Stay: Payer: BC Managed Care – PPO

## 2020-10-07 VITALS — BP 112/70 | HR 76 | Temp 98.4°F | Resp 16 | Ht 69.0 in

## 2020-10-07 DIAGNOSIS — D631 Anemia in chronic kidney disease: Secondary | ICD-10-CM

## 2020-10-07 DIAGNOSIS — N183 Chronic kidney disease, stage 3 unspecified: Secondary | ICD-10-CM | POA: Insufficient documentation

## 2020-10-07 DIAGNOSIS — D509 Iron deficiency anemia, unspecified: Secondary | ICD-10-CM

## 2020-10-07 DIAGNOSIS — F17210 Nicotine dependence, cigarettes, uncomplicated: Secondary | ICD-10-CM | POA: Diagnosis not present

## 2020-10-07 DIAGNOSIS — N189 Chronic kidney disease, unspecified: Secondary | ICD-10-CM

## 2020-10-07 DIAGNOSIS — G629 Polyneuropathy, unspecified: Secondary | ICD-10-CM | POA: Insufficient documentation

## 2020-10-07 DIAGNOSIS — Z4582 Encounter for adjustment or removal of myringotomy device (stent) (tube): Secondary | ICD-10-CM

## 2020-10-07 HISTORY — DX: Iron deficiency anemia, unspecified: D50.9

## 2020-10-07 LAB — TECHNOLOGIST SMEAR REVIEW: Plt Morphology: INCREASED

## 2020-10-07 LAB — CBC WITH DIFFERENTIAL/PLATELET
Abs Immature Granulocytes: 0.06 10*3/uL (ref 0.00–0.07)
Basophils Absolute: 0.2 10*3/uL — ABNORMAL HIGH (ref 0.0–0.1)
Basophils Relative: 1 %
Eosinophils Absolute: 0.6 10*3/uL — ABNORMAL HIGH (ref 0.0–0.5)
Eosinophils Relative: 5 %
HCT: 36.4 % (ref 36.0–46.0)
Hemoglobin: 11.9 g/dL — ABNORMAL LOW (ref 12.0–15.0)
Immature Granulocytes: 0 %
Lymphocytes Relative: 21 %
Lymphs Abs: 3 10*3/uL (ref 0.7–4.0)
MCH: 30.7 pg (ref 26.0–34.0)
MCHC: 32.7 g/dL (ref 30.0–36.0)
MCV: 93.8 fL (ref 80.0–100.0)
Monocytes Absolute: 1.2 10*3/uL — ABNORMAL HIGH (ref 0.1–1.0)
Monocytes Relative: 9 %
Neutro Abs: 8.9 10*3/uL — ABNORMAL HIGH (ref 1.7–7.7)
Neutrophils Relative %: 64 %
Platelets: 460 10*3/uL — ABNORMAL HIGH (ref 150–400)
RBC: 3.88 MIL/uL (ref 3.87–5.11)
RDW: 15 % (ref 11.5–15.5)
WBC: 13.9 10*3/uL — ABNORMAL HIGH (ref 4.0–10.5)
nRBC: 0 % (ref 0.0–0.2)

## 2020-10-07 LAB — IRON AND TIBC
Iron: 46 ug/dL (ref 28–170)
Saturation Ratios: 9 % — ABNORMAL LOW (ref 10.4–31.8)
TIBC: 532 ug/dL — ABNORMAL HIGH (ref 250–450)
UIBC: 486 ug/dL

## 2020-10-07 LAB — VITAMIN B12: Vitamin B-12: 354 pg/mL (ref 180–914)

## 2020-10-07 LAB — FERRITIN: Ferritin: 15 ng/mL (ref 11–307)

## 2020-10-07 LAB — FOLATE: Folate: 9.5 ng/mL (ref >5.9)

## 2020-10-07 NOTE — Addendum Note (Signed)
Addended by: Earlie Server on: 10/07/2020 11:33 PM   Modules accepted: Orders

## 2020-10-07 NOTE — Progress Notes (Signed)
Hematology/Oncology Consult note [facility] Telephone:([phone] [phone]   Patient Care Team: Gladstone Lighter, MD as PCP - General (Internal Medicine)  REFERRING PROVIDER: Anthonette Legato, MD  CHIEF COMPLAINTS/REASON FOR VISIT:  Evaluation of anemia  HISTORY OF PRESENTING ILLNESS:  Katherine Perez is a  50 y.o.  female with PMH listed below who was referred to me for evaluation of anemia Reviewed patient's recent labs that was don at  her nephrologist office 08/16/2020 labs revealed anemia with hemoglobin of 9.2, MCV 93.8, WBC 11.8, platelet count 410.   Reviewed patient's previous labs ordered by primary care physician's office, anemia is chronic onset , duration is since July 2021.  No aggravating or improving factors.  Associated signs and symptoms: Patient reports fatigue.  Denies SOB with exertion.  Denies weight loss, easy bruising, hematochezia, hemoptysis, hematuria. Patient denies black or bloody bowel movement.  Patient has chronic kidney disease stage III and follows up with Dr. Holley Raring.  Chronic kidney disease was felt to be secondary to dehydration and sepsis.  08/08/2020-08/12/2020, patient was admitted because of acute gastroenteritis, dehydration.  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  Patient is on Cymbalta. Patient smokes cigarettes and also use to drink alcohol, she does not use alcohol any more  Review of Systems  Constitutional:  Positive for fatigue. Negative for appetite change, chills and fever.  HENT:   Negative for hearing loss and voice change.   Eyes:  Negative for eye problems.  Respiratory:  Negative for chest tightness and cough.   Cardiovascular:  Negative for chest pain.  Gastrointestinal:  Negative for abdominal distention, abdominal pain and blood in stool.  Endocrine: Negative for hot flashes.  Genitourinary:  Negative for difficulty urinating and frequency.    Musculoskeletal:  Negative for arthralgias.  Skin:  Negative for itching and rash.  Neurological:  Positive for numbness. Negative for extremity weakness.  Hematological:  Negative for adenopathy.  Psychiatric/Behavioral:  Negative for confusion.     MEDICAL HISTORY:  Past Medical History:  Diagnosis Date   Anemia    Asthma    CKD (chronic kidney disease)    ETOH abuse    GERD (gastroesophageal reflux disease)    IC (interstitial cystitis)    Laxative abuse    Neuropathy    Pneumonia    Raynaud disease     SURGICAL HISTORY: Past Surgical History:  Procedure Laterality Date   COLONOSCOPY     ERCP N/A 07/01/2020   Procedure: ENDOSCOPIC RETROGRADE CHOLANGIOPANCREATOGRAPHY (ERCP);  Surgeon: Lucilla Lame, MD;  Location: [facility] ENDOSCOPY;  Service: Endoscopy;  Laterality: N/A;   ESOPHAGOGASTRODUODENOSCOPY     FRACTURE SURGERY     femur fracture left - 2019   LEG SURGERY      SOCIAL HISTORY: Social History   Socioeconomic History   Marital status: Married    Spouse name: Not on file   Number of children: Not on file   Years of education: Not on file   Highest education level: Not on file  Occupational History   Not on file  Tobacco Use   Smoking status: Every Day    Pack years: 0.00    Types: Cigarettes   Smokeless tobacco: Never  Substance and Sexual Activity   Alcohol use: Yes    Comment: last drink 03/2020   Drug use: Yes    Types: Marijuana   Sexual activity: Not on file  Other Topics Concern   Not on file  Social History  Narrative   Not on file   Social Determinants of Health   Financial Resource Strain: Not on file  Food Insecurity: Not on file  Transportation Needs: Not on file  Physical Activity: Not on file  Stress: Not on file  Social Connections: Not on file  Intimate Partner Violence: Not on file    FAMILY HISTORY: Family History  Problem Relation Age of Onset   Other Neg Hx     ALLERGIES:  is allergic to cetirizine, diazepam, and  baclofen.  MEDICATIONS:  Current Outpatient Medications  Medication Sig Dispense Refill   albuterol (VENTOLIN HFA) 108 (90 Base) MCG/ACT inhaler Inhale 2 puffs into the lungs every 4 (four) hours as needed for shortness of breath or wheezing.     amitriptyline (ELAVIL) 25 MG tablet Take 25-50 mg by mouth at bedtime.     bisacodyl (DULCOLAX) 5 MG EC tablet Take 2 tablets (10 mg total) by mouth at bedtime as needed for moderate constipation. 30 tablet 1   Brimonidine Tartrate 0.025 % SOLN Place 1 drop into both eyes daily.     doxepin (SINEQUAN) 25 MG capsule Take 25-50 mg by mouth at bedtime.     famotidine (PEPCID) 40 MG tablet Take 40 mg by mouth at bedtime.     fexofenadine (ALLEGRA) 180 MG tablet Take 180 mg by mouth daily as needed for allergies.     FIBER PO Take 1 capsule by mouth daily.     fluocinonide cream (LIDEX) 0.93 % Apply 1 application topically 2 (two) times daily.     fluticasone (FLONASE) 50 MCG/ACT nasal spray Place 2 sprays into both nostrils 2 (two) times daily.      Fluticasone-Salmeterol (ADVAIR) 250-50 MCG/DOSE AEPB Inhale 1 puff into the lungs 2 (two) times daily.     gabapentin (NEURONTIN) 300 MG capsule Take 1 capsule (300 mg total) by mouth 2 (two) times daily. 15 capsule 0   iron polysaccharides (NIFEREX) 150 MG capsule Take 150 mg by mouth daily.     KLOR-CON M20 20 MEQ tablet Take 20 mEq by mouth 2 (two) times daily.     medroxyPROGESTERone (DEPO-PROVERA) 150 MG/ML injection Inject 150 mg into the muscle every 3 (three) months.     melatonin 3 MG TABS tablet Take 2 tablets (6 mg total) by mouth at bedtime. 60 tablet 0   Meth-Hyo-M Bl-Na Phos-Ph Sal (URIBEL) 118 MG CAPS Take 1 capsule by mouth daily.     metoCLOPramide (REGLAN) 10 MG tablet Take 10 mg by mouth in the morning and at bedtime.     ondansetron (ZOFRAN) 8 MG tablet Take 8 mg by mouth every 8 (eight) hours as needed for vomiting or nausea.     oxybutynin (DITROPAN-XL) 10 MG 24 hr tablet Take 10 mg by  mouth daily at 12 noon.     pentosan polysulfate (ELMIRON) 100 MG capsule Take 100 mg by mouth at bedtime.     promethazine (PHENERGAN) 25 MG tablet Take 25 mg by mouth every 8 (eight) hours as needed for nausea or vomiting.      RABEprazole (ACIPHEX) 20 MG tablet Take by mouth.     sodium citrate-citric acid (ORACIT) 500-334 MG/5ML solution Take 15 mLs by mouth in the morning and at bedtime.     tamsulosin (FLOMAX) 0.4 MG CAPS capsule Take 0.4 mg by mouth at bedtime.     theophylline (UNIPHYL) 400 MG 24 hr tablet Take 400 mg by mouth 2 (two) times daily.  traMADol (ULTRAM) 50 MG tablet Take 50 mg by mouth every 6 (six) hours as needed for moderate pain.     trimethoprim (TRIMPEX) 100 MG tablet Take 100 mg by mouth daily.     TRULANCE 3 MG TABS Take 3 mg by mouth daily at 6 (six) AM.      DULoxetine (CYMBALTA) 20 MG capsule Take 20 mg by mouth daily.     hydrOXYzine (ATARAX/VISTARIL) 25 MG tablet Take 25-50 mg by mouth at bedtime. (Patient not taking: Reported on 10/07/2020)     No current facility-administered medications for this visit.     PHYSICAL EXAMINATION: ECOG PERFORMANCE STATUS: 1 - Symptomatic but completely ambulatory Vitals:   10/07/20 1224  BP: 112/70  Pulse: 76  Resp: 16  Temp: 98.4 F (36.9 C)  SpO2: 100%   Filed Weights    Physical Exam Constitutional:      General: She is not in acute distress.    Comments: Patient was with a walker  HENT:     Head: Normocephalic and atraumatic.  Eyes:     General: No scleral icterus. Cardiovascular:     Rate and Rhythm: Normal rate and regular rhythm.     Heart sounds: Normal heart sounds.  Pulmonary:     Effort: Pulmonary effort is normal. No respiratory distress.     Breath sounds: No wheezing.  Abdominal:     General: Bowel sounds are normal. There is no distension.     Palpations: Abdomen is soft.  Musculoskeletal:        General: No deformity. Normal range of motion.     Cervical back: Normal range of  motion and neck supple.  Skin:    General: Skin is warm and dry.     Findings: No erythema or rash.  Neurological:     Mental Status: She is alert and oriented to person, place, and time. Mental status is at baseline.     Cranial Nerves: No cranial nerve deficit.     Coordination: Coordination normal.  Psychiatric:        Mood and Affect: Mood normal.     LABORATORY DATA:  I have reviewed the data as listed Lab Results  Component Value Date   WBC 13.9 (H) 10/07/2020   HGB 11.9 (L) 10/07/2020   HCT 36.4 10/07/2020   MCV 93.8 10/07/2020   PLT 460 (H) 10/07/2020   Recent Labs    11/01/19 0748 11/02/19 0627 11/03/19 0506 04/22/20 2141 04/23/20 0020 04/23/20 0430 06/30/20 0447 07/01/20 0418 08/10/20 0519 08/11/20 0501 08/11/20 1355 08/12/20 0457 08/12/20 1306  NA 132* 134* 136   < >  --    < > 137   < > 136 133*  --  132* 132*  K 5.2* 5.3* 4.9   < >  --    < > 5.5*   < > 3.2* 3.0* 3.2* 3.8 4.0  CL 96* 101 104   < >  --    < > 113*   < > 109 98  --  93* 92*  CO2 _0 < >  --    < > 14*   < > 18* 24  --  29 30  GLUCOSE 101* 95 91   < >  --    < > 77   < > 100* 95  --  97 141*  BUN 61* 54* 53*   < >  --    < > 37*   < >  75* 59*  --  49* 43*  CREATININE 2.94* 2.62* 2.59*   < >  --    < > 1.50*   < > 2.58* 2.15*  --  1.99* 2.01*  CALCIUM 9.1 8.9 8.7*   < >  --    < > 8.8*   < > 7.6* 7.5*  --  7.5* 7.6*  GFRNONAA 18* 21* 21*   < >  --    < > 43*   < > 22* 28*  --  30* 30*  GFRAA 21* 24* 24*  --   --   --   --   --   --   --   --   --   --   PROT  --   --   --   --  6.6   < > 6.2*   < > 4.9* 4.7*  --  5.4*  --   ALBUMIN  --   --   --   --  3.4*   < > 2.9*   < > 2.5* 2.3*  --  2.6*  --   AST  --   --   --   --  20   < > 27   < > 10* 12*  --  18  --   ALT  --   --   --   --  18   < > 8   < > 8 8  --  12  --   ALKPHOS  --   --   --   --  104   < > 87   < > 77 81  --  91  --   BILITOT  --   --   --   --  0.8   < > 0.7   < > 0.6 0.3  --  0.5  --   BILIDIR  --   --   --   --   <0.1  --  <0.1  --   --   --   --   --   --   IBILI  --   --   --   --  NOT CALCULATED  --  NOT CALCULATED  --   --   --   --   --   --    < > = values in this interval not displayed.   Iron/TIBC/Ferritin/ %Sat    Component Value Date/Time   IRON 46 10/07/2020 1323   TIBC 532 (H) 10/07/2020 1323   FERRITIN 15 10/07/2020 1323   IRONPCTSAT 9 (L) 10/07/2020 1323        ASSESSMENT & PLAN:  1. Anemia in chronic kidney disease, unspecified CKD stage   2. Iron deficiency anemia, unspecified iron deficiency anemia type    Anemia: most likely due to chronic kidney disease, rule out other etiologies.  Check iron TIBC ferritin, folate, B12, multiple myeloma panel, light chain ratio, CBC, check smear.  Labs are available after patient's encounter. Iron panel showed decreased iron saturation to 9, ferritin 15, hemoglobin 11.9 Mild thrombocytosis with platelet count of 160,000.  Normal folate level, normal vitamin B12 level Multiple myeloma pending  Iron deficiency anemia, will recommend patient to start oral iron supplementation- Niferex.  We also discussed about IV Venofer treatment options and the rationale of potential side effects were discussed.  If she does not tolerate oral iron supplementation IV Venofer treatment can be considered. Hemoglobin is above 10, no need for  erythropoietin therapy.  Recommend patient to follow-up in 3 months for follow-up and repeat labs.  Orders Placed This Encounter  Procedures   CBC with Differential/Platelet    Standing Status:   Future    Number of Occurrences:   1    Standing Expiration Date:   10/07/2021   Technologist smear review    Standing Status:   Future    Number of Occurrences:   1    Standing Expiration Date:   10/07/2021   Multiple Myeloma Panel (SPEP&IFE w/QIG)    Standing Status:   Future    Number of Occurrences:   1    Standing Expiration Date:   10/07/2021   Kappa/lambda light chains    Standing Status:   Future    Number of  Occurrences:   1    Standing Expiration Date:   10/07/2021   Vitamin B12    Standing Status:   Future    Number of Occurrences:   1    Standing Expiration Date:   10/07/2021   Folate    Standing Status:   Future    Number of Occurrences:   1    Standing Expiration Date:   10/07/2021   Ferritin    Standing Status:   Future    Number of Occurrences:   1    Standing Expiration Date:   10/07/2021   Iron and TIBC    Standing Status:   Future    Number of Occurrences:   1    Standing Expiration Date:   10/07/2021    All questions were answered. The patient knows to call the clinic with any problems questions or concerns. CcHolley Raring, [place], MD  Return of visit: 3 months Thank you for this kind referral and the opportunity to participate in the care of this patient. A copy of today's note is routed to referring provider   Earlie Server, MD, PhD 10/07/2020

## 2020-10-10 ENCOUNTER — Telehealth: Payer: Self-pay | Admitting: Oncology

## 2020-10-10 ENCOUNTER — Telehealth: Payer: Self-pay

## 2020-10-10 LAB — KAPPA/LAMBDA LIGHT CHAINS
Kappa free light chain: 109.6 mg/L — ABNORMAL HIGH (ref 3.3–19.4)
Kappa, lambda light chain ratio: 1.1 (ref 0.26–1.65)
Lambda free light chains: 99.6 mg/L — ABNORMAL HIGH (ref 5.7–26.3)

## 2020-10-10 NOTE — Telephone Encounter (Signed)
Spoke to patient and she does not want to do oral iron as she has severe constipation. She is agreeable to IV iron.   Please call and schedule pt for IV venofer *new* weekly x3 Labs in 3 months with MD a few days after labs.

## 2020-10-10 NOTE — Telephone Encounter (Signed)
Left pt Vm to contact office to schedule Venofer x 3 weekly per Dr. Tasia Catchings request.

## 2020-10-10 NOTE — Telephone Encounter (Signed)
Please schedule patient for weekly venofer x3 and notify her of appts please. Pt is aware. Thanks.

## 2020-10-10 NOTE — Telephone Encounter (Signed)
Called pt x2, but no answer. Left message for her to call back.

## 2020-10-10 NOTE — Telephone Encounter (Signed)
-----   Message from Earlie Server, MD sent at 10/07/2020 11:32 PM EDT ----- Please let her know that blood work showed improved blood level, iron panel is still low. Recommend her to try oral iron supplementation, ask if she still has Niferex, this is on her med list. If she needs refills, ok to refill 90 days supply.  If she can not tolerate, please arrange her to do IV venofer weekly x 3.   Recommend her to follow up with me in 3 months, labs prior, - ordered, and MD followu p

## 2020-10-11 ENCOUNTER — Telehealth: Payer: Self-pay | Admitting: Oncology

## 2020-10-11 LAB — MULTIPLE MYELOMA PANEL, SERUM
Albumin SerPl Elph-Mcnc: 3.9 g/dL (ref 2.9–4.4)
Albumin/Glob SerPl: 1.4 (ref 0.7–1.7)
Alpha 1: 0.2 g/dL (ref 0.0–0.4)
Alpha2 Glob SerPl Elph-Mcnc: 0.9 g/dL (ref 0.4–1.0)
B-Globulin SerPl Elph-Mcnc: 1.1 g/dL (ref 0.7–1.3)
Gamma Glob SerPl Elph-Mcnc: 0.7 g/dL (ref 0.4–1.8)
Globulin, Total: 2.9 g/dL (ref 2.2–3.9)
IgA: 126 mg/dL (ref 87–352)
IgG (Immunoglobin G), Serum: 454 mg/dL — ABNORMAL LOW (ref 586–1602)
IgM (Immunoglobulin M), Srm: 621 mg/dL — ABNORMAL HIGH (ref 26–217)
Total Protein ELP: 6.8 g/dL (ref 6.0–8.5)

## 2020-10-11 NOTE — Telephone Encounter (Signed)
Katherine Perez, this is what pt's call was about. Can you please schedule and give her dates.

## 2020-10-12 ENCOUNTER — Encounter: Payer: Self-pay | Admitting: Oncology

## 2020-10-12 NOTE — Telephone Encounter (Signed)
Katherine Perez will you schedule patient for weekly venofer *new* x3 please and call her with appts. She called yesterday to ask about these appts.

## 2020-10-12 NOTE — Telephone Encounter (Signed)
Left message for patient to confirm weekly venofer appointments scheduled for 6/24 at 2pm, 7/1 at 2pm and 7/8 at 2pm.  Appointment reminder mailed and MyChart message sent.

## 2020-10-14 ENCOUNTER — Ambulatory Visit: Payer: BC Managed Care – PPO

## 2020-10-17 ENCOUNTER — Encounter: Payer: Self-pay | Admitting: Gastroenterology

## 2020-10-18 ENCOUNTER — Ambulatory Visit: Payer: BC Managed Care – PPO | Admitting: Anesthesiology

## 2020-10-18 ENCOUNTER — Ambulatory Visit
Admission: RE | Admit: 2020-10-18 | Discharge: 2020-10-18 | Disposition: A | Discharge status: Home / Self Care | Payer: BC Managed Care – PPO | Attending: Gastroenterology | Admitting: Gastroenterology

## 2020-10-18 ENCOUNTER — Encounter
Admission: RE | Disposition: A | Discharge status: Home / Self Care | Payer: Self-pay | Source: Home / Self Care | Attending: Gastroenterology

## 2020-10-18 ENCOUNTER — Ambulatory Visit: Payer: BC Managed Care – PPO

## 2020-10-18 ENCOUNTER — Other Ambulatory Visit: Payer: Self-pay

## 2020-10-18 ENCOUNTER — Encounter: Payer: Self-pay | Admitting: Gastroenterology

## 2020-10-18 DIAGNOSIS — Z7951 Long term (current) use of inhaled steroids: Secondary | ICD-10-CM | POA: Insufficient documentation

## 2020-10-18 DIAGNOSIS — Z79899 Other long term (current) drug therapy: Secondary | ICD-10-CM | POA: Diagnosis not present

## 2020-10-18 DIAGNOSIS — Z888 Allergy status to other drugs, medicaments and biological substances status: Secondary | ICD-10-CM | POA: Diagnosis not present

## 2020-10-18 DIAGNOSIS — F17210 Nicotine dependence, cigarettes, uncomplicated: Secondary | ICD-10-CM | POA: Insufficient documentation

## 2020-10-18 DIAGNOSIS — Z4659 Encounter for fitting and adjustment of other gastrointestinal appliance and device: Secondary | ICD-10-CM | POA: Insufficient documentation

## 2020-10-18 DIAGNOSIS — N189 Chronic kidney disease, unspecified: Secondary | ICD-10-CM | POA: Insufficient documentation

## 2020-10-18 DIAGNOSIS — K219 Gastro-esophageal reflux disease without esophagitis: Secondary | ICD-10-CM | POA: Insufficient documentation

## 2020-10-18 DIAGNOSIS — Z4582 Encounter for adjustment or removal of myringotomy device (stent) (tube): Secondary | ICD-10-CM | POA: Diagnosis not present

## 2020-10-18 HISTORY — PX: ERCP: SHX5425

## 2020-10-18 SURGERY — ERCP, WITH INTERVENTION IF INDICATED
Anesthesia: General

## 2020-10-18 MED ORDER — MIDAZOLAM HCL 5 MG/5ML IJ SOLN
INTRAMUSCULAR | Status: DC | PRN
  Administered 2020-10-18: 2 mg via INTRAVENOUS

## 2020-10-18 MED ORDER — LIDOCAINE 2% (20 MG/ML) 5 ML SYRINGE
INTRAMUSCULAR | Status: DC | PRN
  Administered 2020-10-18: 20 mg via INTRAVENOUS

## 2020-10-18 MED ORDER — LACTATED RINGERS IV SOLN: INTRAVENOUS | Status: DC

## 2020-10-18 MED ORDER — GLYCOPYRROLATE 0.2 MG/ML IJ SOLN
INTRAMUSCULAR | Status: DC | PRN
Start: 2020-10-18 — End: 2020-10-18
  Administered 2020-10-18: .2 mg via INTRAVENOUS

## 2020-10-18 MED ORDER — SODIUM CHLORIDE 0.9 % IV SOLN: INTRAVENOUS | Status: DC

## 2020-10-18 MED ORDER — PROPOFOL 500 MG/50ML IV EMUL
INTRAVENOUS | Status: DC | PRN
  Administered 2020-10-18: 120 ug/kg/min via INTRAVENOUS

## 2020-10-18 MED ORDER — PROPOFOL 10 MG/ML IV BOLUS
INTRAVENOUS | Status: DC | PRN
  Administered 2020-10-18 (×2): 50 mg via INTRAVENOUS

## 2020-10-18 MED ORDER — MIDAZOLAM HCL 2 MG/2ML IJ SOLN
INTRAMUSCULAR | Status: AC
  Filled 2020-10-18: qty 2

## 2020-10-18 MED ORDER — GLYCOPYRROLATE 0.2 MG/ML IJ SOLN
INTRAMUSCULAR | Status: AC
  Filled 2020-10-18: qty 1

## 2020-10-18 NOTE — Anesthesia Postprocedure Evaluation (Signed)
Anesthesia Post Note  Patient: Katherine Perez  Procedure(s) Performed: ENDOSCOPIC RETROGRADE CHOLANGIOPANCREATOGRAPHY (ERCP) STENT REMOVAL  Patient location during evaluation: Endoscopy Anesthesia Type: General Level of consciousness: awake and alert Pain management: pain level controlled Vital Signs Assessment: post-procedure vital signs reviewed and stable Respiratory status: spontaneous breathing, nonlabored ventilation, respiratory function stable and patient connected to nasal cannula oxygen Cardiovascular status: blood pressure returned to baseline and stable Postop Assessment: no apparent nausea or vomiting Anesthetic complications: no   No notable events documented.   Last Vitals:  Vitals:   10/18/20 1210 10/18/20 1220  BP: 104/68 108/65  Pulse: 78 78  Resp: 16 19  Temp:    SpO2: 100% 100%    Last Pain:  Vitals:   10/18/20 1220  TempSrc:   PainSc: 0-No pain                 Martha Clan

## 2020-10-18 NOTE — H&P (Signed)
Katherine Lame, MD [address] [place],  [address] Phone:[phone] Fax : [phone]  Primary Care Physician:  Gladstone Lighter, MD Primary Gastroenterologist:  Dr. Allen Norris  Pre-Procedure History & Physical: HPI:  Katherine Perez is a 50 y.o. female is here for an ERCP.   Past Medical History:  Diagnosis Date   Anemia    Asthma    CKD (chronic kidney disease)    ETOH abuse    GERD (gastroesophageal reflux disease)    IC (interstitial cystitis)    IDA (iron deficiency anemia) 10/07/2020   Laxative abuse    Neuropathy    Pneumonia    Raynaud disease     Past Surgical History:  Procedure Laterality Date   COLONOSCOPY     ERCP N/A 07/01/2020   Procedure: ENDOSCOPIC RETROGRADE CHOLANGIOPANCREATOGRAPHY (ERCP);  Surgeon: Katherine Lame, MD;  Location: [facility] ENDOSCOPY;  Service: Endoscopy;  Laterality: N/A;   ESOPHAGOGASTRODUODENOSCOPY     FRACTURE SURGERY     femur fracture left - 2019   LEG SURGERY      Prior to Admission medications   Medication Sig Start Date End Date Taking? Authorizing Provider  amitriptyline (ELAVIL) 25 MG tablet Take 25-50 mg by mouth at bedtime. 06/13/20  Yes [provider]  bisacodyl (DULCOLAX) 5 MG EC tablet Take 2 tablets (10 mg total) by mouth at bedtime as needed for moderate constipation. 07/06/20 07/06/21 Yes Val Riles, MD  Brimonidine Tartrate 0.025 % SOLN Place 1 drop into both eyes daily.   Yes [provider]  doxepin (SINEQUAN) 25 MG capsule Take 25-50 mg by mouth at bedtime. 07/19/20  Yes [provider]  DULoxetine (CYMBALTA) 20 MG capsule Take 20 mg by mouth daily. 09/11/20  Yes [provider]  Dupilumab (DUPIXENT [place]) Inject 300 mg into the skin once a week.   Yes [provider]  famotidine (PEPCID) 40 MG tablet Take 40 mg by mouth at bedtime. 10/07/19  Yes [provider]  fexofenadine (ALLEGRA) 180 MG tablet Take 180 mg by mouth daily as needed for allergies.    Yes [provider]  fluticasone (FLONASE) 50 MCG/ACT nasal spray Place 2 sprays into both nostrils 2 (two) times daily.  09/14/19  Yes [provider]  Fluticasone-Salmeterol (ADVAIR) 250-50 MCG/DOSE AEPB Inhale 1 puff into the lungs 2 (two) times daily. 09/14/19  Yes [provider]  gabapentin (NEURONTIN) 300 MG capsule Take 1 capsule (300 mg total) by mouth 2 (two) times daily. 08/12/20  Yes Sharen Hones, MD  iron polysaccharides (NIFEREX) 150 MG capsule Take 150 mg by mouth daily.   Yes [provider]  melatonin 3 MG TABS tablet Take 2 tablets (6 mg total) by mouth at bedtime. 07/06/20  Yes Val Riles, MD  metoCLOPramide (REGLAN) 10 MG tablet Take 10 mg by mouth in the morning and at bedtime. 10/17/19  Yes [provider]  oxybutynin (DITROPAN-XL) 10 MG 24 hr tablet Take 10 mg by mouth daily at 12 noon. 04/17/20  Yes [provider]  pentosan polysulfate (ELMIRON) 100 MG capsule Take 100 mg by mouth at bedtime.   Yes [provider]  RABEprazole (ACIPHEX) 20 MG tablet Take by mouth. 09/20/20  Yes [provider]  tamsulosin (FLOMAX) 0.4 MG CAPS capsule Take 0.4 mg by mouth at bedtime. 10/10/19  Yes [provider]  theophylline (UNIPHYL) 400 MG 24 hr tablet Take 400 mg by mouth 2 (two) times daily.  10/10/19  Yes [provider]  traMADol (ULTRAM) 50 MG tablet Take 50 mg by mouth every 6 (six) hours as needed for moderate pain.   Yes [provider]  TRULANCE 3 MG TABS Take 3 mg by mouth daily at 6 (six) AM.  10/12/19  Yes [provider]  albuterol (VENTOLIN HFA) 108 (90 Base) MCG/ACT inhaler Inhale 2 puffs into the lungs every 4 (four) hours as needed for shortness of breath or wheezing. 06/07/19   [provider]  FIBER PO Take 1 capsule by mouth daily.    [provider]  fluocinonide cream (LIDEX) [id number] % Apply 1 application topically 2 (two) times daily. 07/28/20   [provider]  hydrOXYzine (ATARAX/VISTARIL) 25 MG tablet Take 25-50 mg by mouth at bedtime. Patient not taking: Reported on 10/07/2020    [provider]  KLOR-CON M20 20 MEQ tablet Take 20 mEq by mouth 2 (two) times daily. 07/26/20   [provider]  medroxyPROGESTERone (DEPO-PROVERA) 150 MG/ML injection Inject 150 mg into the muscle every 3 (three) months.    [provider]  Meth-Hyo-M Bl-Na Phos-Ph Sal (URIBEL) 118 MG CAPS Take 1 capsule by mouth daily. 06/21/20   [provider]  ondansetron (ZOFRAN) 8 MG tablet Take 8 mg by mouth every 8 (eight) hours as needed for vomiting or nausea. 10/23/19   [provider]  promethazine (PHENERGAN) 25 MG tablet Take 25 mg by mouth every 8 (eight) hours as needed for nausea or vomiting.  10/23/19   [provider]  sodium citrate-citric acid (ORACIT) 500-334 MG/5ML solution Take 15 mLs by mouth in the morning and at bedtime. 05/30/20   [provider]  trimethoprim (TRIMPEX) 100 MG tablet Take 100 mg by mouth daily. 09/24/20   [provider]    Allergies as of 10/07/2020 - Review Complete 10/07/2020  Allergen Reaction Noted   Cetirizine Hives 03/27/2013   Diazepam Other (See Comments) 11/18/2014   Baclofen Anxiety and Other (See Comments) 09/12/2015    Family History  Problem Relation Age of Onset   Other Neg Hx     Social History   Socioeconomic History   Marital status: Married    Spouse name: Not on file   Number of children: Not on file   Years of education: Not on file   Highest education level: Not on file  Occupational History   Not on file  Tobacco Use   Smoking status: Every Day    Pack years: 0.00    Types: Cigarettes   Smokeless tobacco: Never  Vaping Use   Vaping Use: Never used  Substance and Sexual Activity   Alcohol use: Yes    Comment: last drink 03/2020   Drug use: Yes    Types: Marijuana   Sexual activity: Not on file  Other Topics Concern   Not  on file  Social History Narrative   Not on file   Social Determinants of Health   Financial Resource Strain: Not on file  Food Insecurity: Not on file  Transportation Needs: Not on file  Physical Activity: Not on file  Stress: Not on file  Social Connections: Not on file  Intimate Partner Violence: Not on file    Review of Systems: See HPI, otherwise negative ROS  Physical Exam: BP 120/68   Pulse 74   Temp 98.8 F (37.1 C) (Temporal)   Resp 16   Ht '5\' 9"'$  (1.753 m)   Wt 59 kg   SpO2 99%   BMI 19.20 kg/m  General:   Alert,  pleasant and cooperative in NAD Head:  Normocephalic and atraumatic. Neck:  Supple; no masses or thyromegaly. Lungs:  Clear throughout to auscultation.    Heart:  Regular rate and rhythm. Abdomen:  Soft, nontender and nondistended. Normal bowel sounds, without guarding, and without rebound.   Neurologic:  Alert and  oriented x4;  grossly normal neurologically.  Impression/Plan: Katherine Perez is here for an ERCP to be performed for for stent removal  Risks, benefits, limitations, and alternatives regarding  ERCP have been reviewed with the patient.  Questions have been answered.  All parties agreeable.   Katherine Lame, MD  10/18/2020, 11:15 AM

## 2020-10-18 NOTE — Anesthesia Preprocedure Evaluation (Signed)
Anesthesia Evaluation  Patient identified by MRN, date of birth, ID band Patient awake    Reviewed: Allergy & Precautions, NPO status , Patient's Chart, lab work & pertinent test results  History of Anesthesia Complications Negative for: history of anesthetic complications  Airway Mallampati: II  TM Distance: >3 FB Neck ROM: Full    Dental no notable dental hx.    Pulmonary asthma , Current Smoker and Patient abstained from smoking.,    breath sounds clear to auscultation- rhonchi (-) wheezing      Cardiovascular Exercise Tolerance: Good (-) hypertension(-) CAD, (-) Past MI, (-) Cardiac Stents and (-) CABG  Rhythm:Regular Rate:Normal - Systolic murmurs and - Diastolic murmurs    Neuro/Psych neg Seizures PSYCHIATRIC DISORDERS (hx of EtOH abuse) negative neurological ROS     GI/Hepatic Neg liver ROS, GERD  ,  Endo/Other  negative endocrine ROSneg diabetes  Renal/GU CRFRenal disease     Musculoskeletal negative musculoskeletal ROS (+)   Abdominal (+) - obese,   Peds  Hematology  (+) Blood dyscrasia, anemia ,   Anesthesia Other Findings Past Medical History: No date: Anemia No date: Asthma No date: CKD (chronic kidney disease) No date: ETOH abuse No date: GERD (gastroesophageal reflux disease) No date: IC (interstitial cystitis) 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 No date: Neuropathy No date: Pneumonia No date: Raynaud disease   Reproductive/Obstetrics                             Anesthesia Physical  Anesthesia Plan  ASA: 3  Anesthesia Plan: General   Post-op Pain Management:    Induction: Intravenous  PONV Risk Score and Plan: 2 and TIVA and Propofol infusion  Airway Management Planned: Natural Airway and Nasal Cannula  Additional Equipment:   Intra-op Plan:   Post-operative Plan: Extubation in OR  Informed Consent: I have reviewed the patients History and Physical, chart, labs  and discussed the procedure including the risks, benefits and alternatives for the proposed anesthesia with the patient or authorized representative who has indicated his/her understanding and acceptance.     Dental advisory given  Plan Discussed with: CRNA and Anesthesiologist  Anesthesia Plan Comments:         Anesthesia Quick Evaluation

## 2020-10-18 NOTE — Transfer of Care (Signed)
Immediate Anesthesia Transfer of Care Note  Patient: Katherine Perez  Procedure(s) Performed: ENDOSCOPIC RETROGRADE CHOLANGIOPANCREATOGRAPHY (ERCP) STENT REMOVAL  Patient Location: Endoscopy Unit  Anesthesia Type:General  Level of Consciousness: awake, alert  and oriented  Airway & Oxygen Therapy: Patient Spontanous Breathing  Post-op Assessment: Report given to RN and Post -op Vital signs reviewed and stable  Post vital signs: Reviewed and stable  Last Vitals:  Vitals Value Taken Time  BP 102/70 10/18/20 1200  Temp    Pulse 81 10/18/20 1201  Resp 16 10/18/20 1201  SpO2 100 % 10/18/20 1201  Vitals shown include unvalidated device data.  Last Pain:  Vitals:   10/18/20 1200  TempSrc:   PainSc: 0-No pain         Complications: No notable events documented.

## 2020-10-18 NOTE — Op Note (Signed)
[facility] Gastroenterology Patient Name: Katherine Perez Procedure Date: 10/18/2020 11:23 AM MRN: [id number] Account #: [id number] Date of Birth: 1970-10-30 Admit Type: Outpatient Age: 50 Room: [facility] ENDO ROOM 4 Gender: Female Note Status: Finalized Procedure:             ERCP Indications:           Biliary stent removal Providers:             Lucilla Lame MD, MD Medicines:             Propofol per Anesthesia Complications:         No immediate complications. Procedure:             Pre-Anesthesia Assessment:                        - Prior to the procedure, a History and Physical was                         performed, and patient medications and allergies were                         reviewed. The patient's tolerance of previous                         anesthesia was also reviewed. The risks and benefits                         of the procedure and the sedation options and risks                         were discussed with the patient. All questions were                         answered, and informed consent was obtained. Prior                         Anticoagulants: The patient has taken no previous                         anticoagulant or antiplatelet agents. ASA Grade                         Assessment: II - A patient with mild systemic disease.                         After reviewing the risks and benefits, the patient                         was deemed in satisfactory condition to undergo the                         procedure.                        After obtaining informed consent, the scope was passed                         under direct vision. Throughout the procedure, the  patient's blood pressure, pulse, and oxygen                         saturations were monitored continuously. The [employer] D single use duodenoscope was                         introduced through the mouth, and used to  inject                         contrast into and used to inject contrast into the                         bile duct. The ERCP was accomplished without                         difficulty. The patient tolerated the procedure well. Findings:      A biliary stent was visible on the scout film. One stent was removed       from the common bile duct using a snare. A wire was passed into the       biliary tree. The bile duct was deeply cannulated with the short-nosed       traction sphincterotome. Contrast was injected. I personally interpreted       the bile duct images. There was brisk flow of contrast through the       ducts. Image quality was excellent. Contrast extended to the entire       biliary tree. The main bile duct was dilated. Impression:            - The entire main bile duct was dilated.                        - One stent was removed from the common bile duct. Recommendation:        - Discharge patient to home.                        - Resume previous diet.                        - Watch for pancreatitis, bleeding, perforation, and                         cholangitis. Procedure Code(s):     --- Professional ---                        [phone], Endoscopic retrograde cholangiopancreatography                         (ERCP); with removal of foreign body(s) or stent(s)                         from biliary/pancreatic duct(s)                        [id number], Endoscopic catheterization of the biliary  ductal system, radiological supervision and                         interpretation Diagnosis Code(s):     --- Professional ---                        Z46.59, Encounter for fitting and adjustment of other                         gastrointestinal appliance and device                        K83.8, Other specified diseases of biliary tract CPT copyright 2019 [employer]. All rights reserved. The codes documented in this report are preliminary and upon coder  review may  be revised to meet current compliance requirements. Lucilla Lame MD, MD 10/18/2020 12:03:46 PM This report has been signed electronically. Number of Addenda: 0 Note Initiated On: 10/18/2020 11:23 AM Estimated Blood Loss:  Estimated blood loss: none.      [facility]

## 2020-10-19 ENCOUNTER — Encounter: Payer: Self-pay | Admitting: Gastroenterology

## 2020-10-21 ENCOUNTER — Inpatient Hospital Stay: Payer: BC Managed Care – PPO | Attending: Oncology

## 2020-10-21 VITALS — BP 106/56 | HR 66 | Temp 97.9°F | Resp 16

## 2020-10-21 DIAGNOSIS — N189 Chronic kidney disease, unspecified: Secondary | ICD-10-CM | POA: Diagnosis not present

## 2020-10-21 DIAGNOSIS — N183 Chronic kidney disease, stage 3 unspecified: Secondary | ICD-10-CM

## 2020-10-21 DIAGNOSIS — D509 Iron deficiency anemia, unspecified: Secondary | ICD-10-CM

## 2020-10-21 DIAGNOSIS — D631 Anemia in chronic kidney disease: Secondary | ICD-10-CM | POA: Diagnosis present

## 2020-10-21 MED ORDER — IRON SUCROSE 20 MG/ML IV SOLN
200.0000 mg | Freq: Once | INTRAVENOUS | Status: AC
  Administered 2020-10-21: 200 mg via INTRAVENOUS
  Filled 2020-10-21: qty 10

## 2020-10-21 MED ORDER — SODIUM CHLORIDE 0.9 % IV SOLN
Freq: Once | INTRAVENOUS | Status: AC
  Filled 2020-10-21: qty 250

## 2020-10-21 MED ORDER — SODIUM CHLORIDE 0.9 % IV SOLN: 200.0000 mg | Freq: Once | INTRAVENOUS | Status: DC

## 2020-10-21 NOTE — Patient Instructions (Signed)

## 2020-10-27 DIAGNOSIS — M0579 Rheumatoid arthritis with rheumatoid factor of multiple sites without organ or systems involvement: Secondary | ICD-10-CM | POA: Diagnosis present

## 2020-10-28 ENCOUNTER — Other Ambulatory Visit: Payer: Self-pay

## 2020-10-28 ENCOUNTER — Inpatient Hospital Stay: Payer: BC Managed Care – PPO

## 2020-10-28 VITALS — BP 90/59 | HR 74 | Temp 96.1°F | Resp 16

## 2020-10-28 DIAGNOSIS — D509 Iron deficiency anemia, unspecified: Secondary | ICD-10-CM

## 2020-10-28 DIAGNOSIS — N189 Chronic kidney disease, unspecified: Secondary | ICD-10-CM | POA: Diagnosis not present

## 2020-10-28 DIAGNOSIS — N183 Chronic kidney disease, stage 3 unspecified: Secondary | ICD-10-CM

## 2020-10-28 MED ORDER — IRON SUCROSE 20 MG/ML IV SOLN
200.0000 mg | Freq: Once | INTRAVENOUS | Status: AC
  Administered 2020-10-28: 200 mg via INTRAVENOUS
  Filled 2020-10-28: qty 10

## 2020-10-28 MED ORDER — SODIUM CHLORIDE 0.9 % IV SOLN: 200.0000 mg | Freq: Once | INTRAVENOUS | Status: DC

## 2020-10-28 MED ORDER — SODIUM CHLORIDE 0.9 % IV SOLN
Freq: Once | INTRAVENOUS | Status: AC
  Filled 2020-10-28: qty 250

## 2020-10-28 NOTE — Patient Instructions (Signed)

## 2020-11-11 ENCOUNTER — Inpatient Hospital Stay: Payer: BC Managed Care – PPO | Attending: Oncology

## 2020-11-11 ENCOUNTER — Other Ambulatory Visit: Payer: Self-pay

## 2020-11-11 VITALS — BP 93/62 | HR 63 | Temp 96.6°F | Resp 16

## 2020-11-11 DIAGNOSIS — D509 Iron deficiency anemia, unspecified: Secondary | ICD-10-CM

## 2020-11-11 DIAGNOSIS — N189 Chronic kidney disease, unspecified: Secondary | ICD-10-CM | POA: Diagnosis present

## 2020-11-11 DIAGNOSIS — D631 Anemia in chronic kidney disease: Secondary | ICD-10-CM | POA: Diagnosis present

## 2020-11-11 DIAGNOSIS — N183 Chronic kidney disease, stage 3 unspecified: Secondary | ICD-10-CM

## 2020-11-11 MED ORDER — IRON SUCROSE 20 MG/ML IV SOLN
200.0000 mg | Freq: Once | INTRAVENOUS | Status: AC
  Administered 2020-11-11: 200 mg via INTRAVENOUS
  Filled 2020-11-11: qty 10

## 2020-11-11 MED ORDER — SODIUM CHLORIDE 0.9 % IV SOLN
Freq: Once | INTRAVENOUS | Status: AC
  Filled 2020-11-11: qty 250

## 2020-11-11 MED ORDER — SODIUM CHLORIDE 0.9 % IV SOLN: 200.0000 mg | Freq: Once | INTRAVENOUS | Status: DC

## 2020-11-11 NOTE — Patient Instructions (Signed)

## 2021-01-05 ENCOUNTER — Other Ambulatory Visit: Payer: Self-pay

## 2021-01-05 ENCOUNTER — Inpatient Hospital Stay: Payer: BC Managed Care – PPO | Attending: Oncology

## 2021-01-05 ENCOUNTER — Inpatient Hospital Stay: Payer: BC Managed Care – PPO

## 2021-01-05 DIAGNOSIS — D509 Iron deficiency anemia, unspecified: Secondary | ICD-10-CM | POA: Insufficient documentation

## 2021-01-05 DIAGNOSIS — N189 Chronic kidney disease, unspecified: Secondary | ICD-10-CM | POA: Insufficient documentation

## 2021-01-05 DIAGNOSIS — D631 Anemia in chronic kidney disease: Secondary | ICD-10-CM | POA: Diagnosis not present

## 2021-01-05 LAB — CBC WITH DIFFERENTIAL/PLATELET
Abs Immature Granulocytes: 0.07 10*3/uL (ref 0.00–0.07)
Basophils Absolute: 0.1 10*3/uL (ref 0.0–0.1)
Basophils Relative: 1 %
Eosinophils Absolute: 0.2 10*3/uL (ref 0.0–0.5)
Eosinophils Relative: 2 %
HCT: 39.1 % (ref 36.0–46.0)
Hemoglobin: 12.9 g/dL (ref 12.0–15.0)
Immature Granulocytes: 1 %
Lymphocytes Relative: 18 %
Lymphs Abs: 1.7 10*3/uL (ref 0.7–4.0)
MCH: 30.2 pg (ref 26.0–34.0)
MCHC: 33 g/dL (ref 30.0–36.0)
MCV: 91.6 fL (ref 80.0–100.0)
Monocytes Absolute: 0.7 10*3/uL (ref 0.1–1.0)
Monocytes Relative: 7 %
Neutro Abs: 6.8 10*3/uL (ref 1.7–7.7)
Neutrophils Relative %: 71 %
Platelets: 289 10*3/uL (ref 150–400)
RBC: 4.27 MIL/uL (ref 3.87–5.11)
RDW: 15 % (ref 11.5–15.5)
WBC: 9.5 10*3/uL (ref 4.0–10.5)
nRBC: 0 % (ref 0.0–0.2)

## 2021-01-05 LAB — RETIC PANEL
Immature Retic Fract: 8.9 % (ref 2.3–15.9)
RBC.: 4.26 MIL/uL (ref 3.87–5.11)
Retic Count, Absolute: 86.5 10*3/uL (ref 19.0–186.0)
Retic Ct Pct: 2 % (ref 0.4–3.1)
Reticulocyte Hemoglobin: 34.2 pg (ref >27.9)

## 2021-01-05 LAB — IRON AND TIBC
Iron: 40 ug/dL (ref 28–170)
Saturation Ratios: 12 % (ref 10.4–31.8)
TIBC: 335 ug/dL (ref 250–450)
UIBC: 295 ug/dL

## 2021-01-05 LAB — FERRITIN: Ferritin: 57 ng/mL (ref 11–307)

## 2021-01-09 ENCOUNTER — Encounter: Payer: Self-pay | Admitting: Oncology

## 2021-01-09 ENCOUNTER — Inpatient Hospital Stay (HOSPITAL_BASED_OUTPATIENT_CLINIC_OR_DEPARTMENT_OTHER): Payer: BC Managed Care – PPO | Admitting: Oncology

## 2021-01-09 VITALS — BP 101/67 | HR 74 | Temp 97.5°F | Resp 18

## 2021-01-09 DIAGNOSIS — D631 Anemia in chronic kidney disease: Secondary | ICD-10-CM | POA: Diagnosis not present

## 2021-01-09 DIAGNOSIS — D509 Iron deficiency anemia, unspecified: Secondary | ICD-10-CM

## 2021-01-09 DIAGNOSIS — N189 Chronic kidney disease, unspecified: Secondary | ICD-10-CM

## 2021-01-09 NOTE — Progress Notes (Signed)
Hematology/Oncology Consult note [facility] [phone] [phone]   Patient Care Team: Gladstone Lighter, MD as PCP - General (Internal Medicine)  REFERRING PROVIDER: Gladstone Lighter, MD  CHIEF COMPLAINTS/REASON FOR VISIT:  Follow up for anemia  HISTORY OF PRESENTING ILLNESS:  Katherine Perez is a  50 y.o.  female with PMH listed below who was referred to me for evaluation of anemia Reviewed patient's recent labs that was don at  her nephrologist office 08/16/2020 labs revealed anemia with hemoglobin of 9.2, MCV 93.8, WBC 11.8, platelet count 410.   Reviewed patient's previous labs ordered by primary care physician's office, anemia is chronic onset , duration is since July 2021.  No aggravating or improving factors.  Associated signs and symptoms: Patient reports fatigue.  Denies SOB with exertion.  Denies weight loss, easy bruising, hematochezia, hemoptysis, hematuria. Patient denies black or bloody bowel movement.  Patient has chronic kidney disease stage III and follows up with Dr. Holley Raring.  Chronic kidney disease was felt to be secondary to dehydration and sepsis.  08/08/2020-08/12/2020, patient was admitted because of acute gastroenteritis, dehydration.  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  Patient is on Cymbalta. Patient smokes cigarettes and also use to drink alcohol, she does not use alcohol any more  INTERVAL HISTORY Katherine Perez is a 50 y.o. female who has above history reviewed by me today presents for follow up visit for management of anemia She has had IV venofer treatments. Tolerates well.  She reports feeling similar before and after iron treatments.    Review of Systems  Constitutional:  Positive for fatigue. Negative for appetite change, chills and fever.  HENT:   Negative for hearing loss and voice change.   Eyes:  Negative for eye problems.  Respiratory:  Negative for chest  tightness and cough.   Cardiovascular:  Negative for chest pain.  Gastrointestinal:  Negative for abdominal distention, abdominal pain and blood in stool.  Endocrine: Negative for hot flashes.  Genitourinary:  Negative for difficulty urinating and frequency.   Musculoskeletal:  Negative for arthralgias.  Skin:  Negative for itching and rash.  Neurological:  Positive for numbness. Negative for extremity weakness.  Hematological:  Negative for adenopathy.  Psychiatric/Behavioral:  Negative for confusion.     MEDICAL HISTORY:  Past Medical History:  Diagnosis Date   Anemia    Asthma    CKD (chronic kidney disease)    ETOH abuse    GERD (gastroesophageal reflux disease)    IC (interstitial cystitis)    IDA (iron deficiency anemia) 10/07/2020   Laxative abuse    Neuropathy    Pneumonia    Raynaud disease     SURGICAL HISTORY: Past Surgical History:  Procedure Laterality Date   COLONOSCOPY     ERCP N/A 07/01/2020   Procedure: ENDOSCOPIC RETROGRADE CHOLANGIOPANCREATOGRAPHY (ERCP);  Surgeon: Lucilla Lame, MD;  Location[facility] ENDOSCOPY;  Service: Endoscopy;  Laterality: N/A;   ERCP N/A 10/18/2020   Procedure: ENDOSCOPIC RETROGRADE CHOLANGIOPANCREATOGRAPHY (ERCP) STENT REMOVAL;  Surgeon: Lucilla Lame, MD;  Location: [facility] ENDOSCOPY;  Service: Endoscopy;  Laterality: N/A;   ESOPHAGOGASTRODUODENOSCOPY     FRACTURE SURGERY     femur fracture left - 2019   LEG SURGERY      SOCIAL HISTORY: Social History   Socioeconomic History   Marital status: Married    Spouse name: Not on file   Number of children: Not on file   Years of education: Not on file   Highest  education level: Not on file  Occupational History   Not on file  Tobacco Use   Smoking status: Every Day    Types: Cigarettes   Smokeless tobacco: Never  Vaping Use   Vaping Use: Never used  Substance and Sexual Activity   Alcohol use: Yes    Comment: last drink 03/2020   Drug use: Yes    Types: Marijuana   Sexual  activity: Not on file  Other Topics Concern   Not on file  Social History Narrative   Not on file   Social Determinants of Health   Financial Resource Strain: Not on file  Food Insecurity: Not on file  Transportation Needs: Not on file  Physical Activity: Not on file  Stress: Not on file  Social Connections: Not on file  Intimate Partner Violence: Not on file    FAMILY HISTORY: Family History  Problem Relation Age of Onset   Other Neg Hx     ALLERGIES:  is allergic to cetirizine, diazepam, and baclofen.  MEDICATIONS:  Current Outpatient Medications  Medication Sig Dispense Refill   albuterol (VENTOLIN HFA) 108 (90 Base) MCG/ACT inhaler Inhale 2 puffs into the lungs every 4 (four) hours as needed for shortness of breath or wheezing.     amitriptyline (ELAVIL) 25 MG tablet Take 25-50 mg by mouth at bedtime.     bisacodyl (DULCOLAX) 5 MG EC tablet Take 2 tablets (10 mg total) by mouth at bedtime as needed for moderate constipation. 30 tablet 1   Brimonidine Tartrate 0.025 % SOLN Place 1 drop into both eyes daily.     doxepin (SINEQUAN) 25 MG capsule Take 25-50 mg by mouth at bedtime.     DULoxetine (CYMBALTA) 20 MG capsule Take 20 mg by mouth daily.     DULoxetine (CYMBALTA) 20 MG capsule Take 20 mg by mouth daily.     Dupilumab (DUPIXENT [place]) Inject 300 mg into the skin once a week.     famotidine (PEPCID) 40 MG tablet Take 40 mg by mouth at bedtime.     fexofenadine (ALLEGRA) 180 MG tablet Take 180 mg by mouth daily as needed for allergies.     FIBER PO Take 1 capsule by mouth daily.     fluocinonide cream (LIDEX) [id number] % Apply 1 application topically 2 (two) times daily.     fluticasone (FLONASE) 50 MCG/ACT nasal spray Place 2 sprays into both nostrils 2 (two) times daily.      Fluticasone-Salmeterol (ADVAIR) 250-50 MCG/DOSE AEPB Inhale 1 puff into the lungs 2 (two) times daily.     gabapentin (NEURONTIN) 300 MG capsule Take 1 capsule (300 mg total) by mouth 2 (two) times  daily. 15 capsule 0   hydrOXYzine (ATARAX/VISTARIL) 25 MG tablet Take 25-50 mg by mouth at bedtime.     KLOR-CON M20 20 MEQ tablet Take 20 mEq by mouth 2 (two) times daily.     medroxyPROGESTERone (DEPO-PROVERA) 150 MG/ML injection Inject 150 mg into the muscle every 3 (three) months.     melatonin 3 MG TABS tablet Take 2 tablets (6 mg total) by mouth at bedtime. 60 tablet 0   Meth-Hyo-M Bl-Na Phos-Ph Sal (URIBEL) 118 MG CAPS Take 1 capsule by mouth daily.     metoCLOPramide (REGLAN) 10 MG tablet Take 10 mg by mouth in the morning and at bedtime.     ondansetron (ZOFRAN) 8 MG tablet Take 8 mg by mouth every 8 (eight) hours as needed for vomiting or nausea.  oxybutynin (DITROPAN-XL) 10 MG 24 hr tablet Take 10 mg by mouth daily at 12 noon.     pentosan polysulfate (ELMIRON) 100 MG capsule Take 100 mg by mouth at bedtime.     promethazine (PHENERGAN) 25 MG tablet Take 25 mg by mouth every 8 (eight) hours as needed for nausea or vomiting.      RABEprazole (ACIPHEX) 20 MG tablet Take by mouth.     sodium citrate-citric acid (ORACIT) 500-334 MG/5ML solution Take 15 mLs by mouth in the morning and at bedtime.     tamsulosin (FLOMAX) 0.4 MG CAPS capsule Take 0.4 mg by mouth at bedtime.     theophylline (UNIPHYL) 400 MG 24 hr tablet Take 400 mg by mouth 2 (two) times daily.      thiamine (VITAMIN B-1) 50 MG tablet Take 50 mg by mouth daily.     traMADol (ULTRAM) 50 MG tablet Take 50 mg by mouth every 6 (six) hours as needed for moderate pain.     trimethoprim (TRIMPEX) 100 MG tablet Take 100 mg by mouth daily.     TRULANCE 3 MG TABS Take 3 mg by mouth daily at 6 (six) AM.      iron polysaccharides (NIFEREX) 150 MG capsule Take 150 mg by mouth daily. (Patient not taking: Reported on 01/09/2021)     No current facility-administered medications for this visit.     PHYSICAL EXAMINATION: ECOG PERFORMANCE STATUS: 1 - Symptomatic but completely ambulatory Vitals:   01/09/21 1405  BP: 101/67  Pulse:  74  Resp: 18  Temp: (!) 97.5 F (36.4 C)   There were no vitals filed for this visit.   Physical Exam Constitutional:      General: She is not in acute distress.    Comments: Patient was with a walker  HENT:     Head: Normocephalic and atraumatic.  Eyes:     General: No scleral icterus. Cardiovascular:     Rate and Rhythm: Normal rate and regular rhythm.     Heart sounds: Normal heart sounds.  Pulmonary:     Effort: Pulmonary effort is normal. No respiratory distress.     Breath sounds: No wheezing.  Abdominal:     General: Bowel sounds are normal. There is no distension.     Palpations: Abdomen is soft.  Musculoskeletal:        General: No deformity. Normal range of motion.     Cervical back: Normal range of motion and neck supple.     Comments: Left lower extremity + brace  Skin:    General: Skin is warm and dry.     Findings: No erythema or rash.  Neurological:     Mental Status: She is alert and oriented to person, place, and time. Mental status is at baseline.     Cranial Nerves: No cranial nerve deficit.     Coordination: Coordination normal.  Psychiatric:        Mood and Affect: Mood normal.     LABORATORY DATA:  I have reviewed the data as listed Lab Results  Component Value Date   WBC 9.5 01/05/2021   HGB 12.9 01/05/2021   HCT 39.1 01/05/2021   MCV 91.6 01/05/2021   PLT 289 01/05/2021   Recent Labs    04/23/20 0020 04/23/20 0430 06/30/20 0447 07/01/20 0418 08/10/20 0519 08/11/20 0501 08/11/20 1355 08/12/20 0457 08/12/20 1306  NA  --    < > 137   < > 136 133*  --  132* 132*  K  --    < > 5.5*   < > 3.2* 3.0* 3.2* 3.8 4.0  CL  --    < > 113*   < > 109 98  --  93* 92*  CO2  --    < > 14*   < > 18* 24  --  29 30  GLUCOSE  --    < > 77   < > 100* 95  --  97 141*  BUN  --    < > 37*   < > 75* 59*  --  49* 43*  CREATININE  --    < > 1.50*   < > 2.58* 2.15*  --  1.99* 2.01*  CALCIUM  --    < > 8.8*   < > 7.6* 7.5*  --  7.5* 7.6*  GFRNONAA  --     < > 43*   < > 22* 28*  --  30* 30*  PROT 6.6   < > 6.2*   < > 4.9* 4.7*  --  5.4*  --   ALBUMIN 3.4*   < > 2.9*   < > 2.5* 2.3*  --  2.6*  --   AST 20   < > 27   < > 10* 12*  --  18  --   ALT 18   < > 8   < > 8 8  --  12  --   ALKPHOS 104   < > 87   < > 77 81  --  91  --   BILITOT 0.8   < > 0.7   < > 0.6 0.3  --  0.5  --   BILIDIR <0.1  --  <0.1  --   --   --   --   --   --   IBILI NOT CALCULATED  --  NOT CALCULATED  --   --   --   --   --   --    < > = values in this interval not displayed.    Iron/TIBC/Ferritin/ %Sat    Component Value Date/Time   IRON 40 01/05/2021 1306   TIBC 335 01/05/2021 1306   FERRITIN 57 01/05/2021 1306   IRONPCTSAT 12 01/05/2021 1306        ASSESSMENT & PLAN:  1. Iron deficiency anemia, unspecified iron deficiency anemia type   2. Anemia in chronic kidney disease, unspecified CKD stage    IDA S/p IV venofer treatments.  She tolerates well.  Anemia has resolved. Iron panel is normal.  Hold additional IV venofer.  Follow up in 6 months. Patient prefers to do labs with her PCP. Recommend checking iron, tibc ferritin, cbc   All questions were answered. The patient knows to call the clinic with any problems questions or concerns. Warren Danes, MD   Thank you for this kind referral and the opportunity to participate in the care of this patient. A copy of today's note is routed to referring provider   Earlie Server, MD, PhD 01/09/2021

## 2021-01-09 NOTE — Progress Notes (Signed)
Pt here for follow up. No new concerns voiced.   

## 2021-01-31 ENCOUNTER — Ambulatory Visit
Admission: RE | Admit: 2021-01-31 | Discharge: 2021-01-31 | Disposition: A | Discharge status: Home / Self Care | Payer: BC Managed Care – PPO | Source: Ambulatory Visit | Attending: Nephrology | Admitting: Nephrology

## 2021-01-31 ENCOUNTER — Other Ambulatory Visit: Payer: Self-pay

## 2021-01-31 DIAGNOSIS — E876 Hypokalemia: Secondary | ICD-10-CM | POA: Insufficient documentation

## 2021-01-31 DIAGNOSIS — N184 Chronic kidney disease, stage 4 (severe): Secondary | ICD-10-CM | POA: Insufficient documentation

## 2021-01-31 MED ORDER — POTASSIUM CHLORIDE 10 MEQ/100ML IV SOLN
10.0000 meq | Freq: Once | INTRAVENOUS | Status: AC
  Administered 2021-01-31: 10 meq via INTRAVENOUS
  Filled 2021-01-31 (×2): qty 100

## 2021-02-01 ENCOUNTER — Ambulatory Visit
Admission: RE | Admit: 2021-02-01 | Discharge: 2021-02-01 | Disposition: A | Discharge status: Home / Self Care | Payer: BC Managed Care – PPO | Source: Ambulatory Visit | Attending: Nephrology | Admitting: Nephrology

## 2021-02-01 DIAGNOSIS — N184 Chronic kidney disease, stage 4 (severe): Secondary | ICD-10-CM | POA: Insufficient documentation

## 2021-02-01 DIAGNOSIS — E876 Hypokalemia: Secondary | ICD-10-CM | POA: Insufficient documentation

## 2021-02-01 MED ORDER — POTASSIUM CHLORIDE 10 MEQ/100ML IV SOLN
10.0000 meq | Freq: Once | INTRAVENOUS | Status: AC
  Administered 2021-02-01: 10 meq via INTRAVENOUS
  Filled 2021-02-01: qty 100

## 2021-02-02 ENCOUNTER — Other Ambulatory Visit: Payer: Self-pay

## 2021-02-02 ENCOUNTER — Ambulatory Visit
Admission: RE | Admit: 2021-02-02 | Discharge: 2021-02-02 | Disposition: A | Discharge status: Home / Self Care | Payer: BC Managed Care – PPO | Source: Ambulatory Visit | Attending: Nephrology | Admitting: Nephrology

## 2021-02-02 DIAGNOSIS — N184 Chronic kidney disease, stage 4 (severe): Secondary | ICD-10-CM | POA: Diagnosis not present

## 2021-02-02 DIAGNOSIS — E876 Hypokalemia: Secondary | ICD-10-CM | POA: Insufficient documentation

## 2021-02-02 MED ORDER — POTASSIUM CHLORIDE 10 MEQ/100ML IV SOLN
10.0000 meq | Freq: Once | INTRAVENOUS | Status: AC
  Administered 2021-02-02: 10 meq via INTRAVENOUS
  Filled 2021-02-02 (×2): qty 100

## 2021-02-03 ENCOUNTER — Ambulatory Visit
Admission: RE | Admit: 2021-02-03 | Discharge: 2021-02-03 | Disposition: A | Discharge status: Home / Self Care | Payer: BC Managed Care – PPO | Source: Ambulatory Visit | Attending: Nephrology | Admitting: Nephrology

## 2021-02-03 ENCOUNTER — Other Ambulatory Visit: Payer: Self-pay

## 2021-02-03 ENCOUNTER — Emergency Department
Admission: EM | Admit: 2021-02-03 | Discharge: 2021-02-03 | Disposition: A | Discharge status: Home / Self Care | Payer: BC Managed Care – PPO | Attending: Emergency Medicine | Admitting: Emergency Medicine

## 2021-02-03 ENCOUNTER — Encounter: Payer: Self-pay | Admitting: Emergency Medicine

## 2021-02-03 DIAGNOSIS — E876 Hypokalemia: Secondary | ICD-10-CM | POA: Insufficient documentation

## 2021-02-03 DIAGNOSIS — N184 Chronic kidney disease, stage 4 (severe): Secondary | ICD-10-CM | POA: Insufficient documentation

## 2021-02-03 DIAGNOSIS — Z5321 Procedure and treatment not carried out due to patient leaving prior to being seen by health care provider: Secondary | ICD-10-CM | POA: Insufficient documentation

## 2021-02-03 DIAGNOSIS — R7989 Other specified abnormal findings of blood chemistry: Secondary | ICD-10-CM | POA: Diagnosis present

## 2021-02-03 LAB — CBC
HCT: 38.1 % (ref 36.0–46.0)
Hemoglobin: 12.8 g/dL (ref 12.0–15.0)
MCH: 32.1 pg (ref 26.0–34.0)
MCHC: 33.6 g/dL (ref 30.0–36.0)
MCV: 95.5 fL (ref 80.0–100.0)
Platelets: 364 10*3/uL (ref 150–400)
RBC: 3.99 MIL/uL (ref 3.87–5.11)
RDW: 14.6 % (ref 11.5–15.5)
WBC: 9.2 10*3/uL (ref 4.0–10.5)
nRBC: 0 % (ref 0.0–0.2)

## 2021-02-03 LAB — BASIC METABOLIC PANEL
Anion gap: 14 (ref 5–15)
BUN: 47 mg/dL — ABNORMAL HIGH (ref 6–20)
CO2: 14 mmol/L — ABNORMAL LOW (ref 22–32)
Calcium: 8.7 mg/dL — ABNORMAL LOW (ref 8.9–10.3)
Chloride: 101 mmol/L (ref 98–111)
Creatinine, Ser: 1.78 mg/dL — ABNORMAL HIGH (ref 0.44–1.00)
GFR, Estimated: 35 mL/min — ABNORMAL LOW (ref >60)
Glucose, Bld: 86 mg/dL (ref 70–99)
Potassium: 2 mmol/L — CL (ref 3.5–5.1)
Sodium: 129 mmol/L — ABNORMAL LOW (ref 135–145)

## 2021-02-03 MED ORDER — POTASSIUM CHLORIDE 10 MEQ/100ML IV SOLN
10.0000 meq | Freq: Once | INTRAVENOUS | Status: AC
  Administered 2021-02-03: 10 meq via INTRAVENOUS
  Filled 2021-02-03: qty 100

## 2021-02-03 NOTE — Progress Notes (Signed)
Lab called with critical lab value, potassium 2.0 Dr. Holley Raring notified and Dr Holley Raring stated to have pt return to [facility] to the ED now. Notified pt @ 1610 and pt instructed to go to the ED. Spoke to charge RN in the ED, notified the critical value potassium, of 2.0.

## 2021-02-03 NOTE — ED Triage Notes (Signed)
Pt in via POV, reports being in same day surgery all week to receive K+ infusions per Dr. Leonidas Romberg orders.  States she received a call this evening to come back to ER due to K+ level being 2.0 per afternoon blood draw.  States this is a chronic issue; denies any complaints.    Patient is very adamant that she does not want to be here, states, "I want Lateef to know that I am not happy about this, I would much rather go home tonight and come back tomorrow for more infusions."  Vitals WDL.  NAD noted at this time.

## 2021-02-04 LAB — BASIC METABOLIC PANEL
Anion gap: 12 (ref 5–15)
BUN: 47 mg/dL — ABNORMAL HIGH (ref 6–20)
CO2: 17 mmol/L — ABNORMAL LOW (ref 22–32)
Calcium: 9.4 mg/dL (ref 8.9–10.3)
Chloride: 100 mmol/L (ref 98–111)
Creatinine, Ser: 1.84 mg/dL — ABNORMAL HIGH (ref 0.44–1.00)
GFR, Estimated: 33 mL/min — ABNORMAL LOW (ref >60)
Glucose, Bld: 106 mg/dL — ABNORMAL HIGH (ref 70–99)
Potassium: 2.3 mmol/L — CL (ref 3.5–5.1)
Sodium: 129 mmol/L — ABNORMAL LOW (ref 135–145)

## 2021-02-06 ENCOUNTER — Ambulatory Visit
Admission: RE | Admit: 2021-02-06 | Discharge: 2021-02-06 | Disposition: A | Discharge status: Home / Self Care | Payer: BC Managed Care – PPO | Source: Ambulatory Visit | Attending: Nephrology | Admitting: Nephrology

## 2021-02-06 ENCOUNTER — Other Ambulatory Visit: Payer: Self-pay

## 2021-02-06 DIAGNOSIS — E876 Hypokalemia: Secondary | ICD-10-CM | POA: Diagnosis present

## 2021-02-06 LAB — MAGNESIUM: Magnesium: 2.4 mg/dL (ref 1.7–2.4)

## 2021-02-06 MED ORDER — SODIUM CHLORIDE FLUSH 0.9 % IV SOLN
INTRAVENOUS | Status: AC
  Filled 2021-02-06: qty 10

## 2021-02-06 MED ORDER — POTASSIUM CHLORIDE 2 MEQ/ML IV SOLN
INTRAVENOUS | Status: AC
  Filled 2021-02-06: qty 1000

## 2021-02-07 ENCOUNTER — Ambulatory Visit
Admission: RE | Admit: 2021-02-07 | Discharge: 2021-02-07 | Disposition: A | Discharge status: Home / Self Care | Payer: BC Managed Care – PPO | Source: Ambulatory Visit | Attending: Nephrology | Admitting: Nephrology

## 2021-02-07 DIAGNOSIS — E876 Hypokalemia: Secondary | ICD-10-CM | POA: Diagnosis not present

## 2021-02-07 MED ORDER — POTASSIUM CHLORIDE 2 MEQ/ML IV SOLN
INTRAVENOUS | Status: AC
  Filled 2021-02-07: qty 1000

## 2021-02-09 ENCOUNTER — Ambulatory Visit
Admission: RE | Admit: 2021-02-09 | Discharge: 2021-02-09 | Disposition: A | Discharge status: Home / Self Care | Payer: BC Managed Care – PPO | Source: Ambulatory Visit | Attending: Nephrology | Admitting: Nephrology

## 2021-02-09 ENCOUNTER — Other Ambulatory Visit: Payer: Self-pay

## 2021-02-09 DIAGNOSIS — Z01812 Encounter for preprocedural laboratory examination: Secondary | ICD-10-CM | POA: Diagnosis not present

## 2021-02-09 MED ORDER — POTASSIUM CHLORIDE 2 MEQ/ML IV SOLN
Freq: Once | INTRAVENOUS | Status: AC
  Filled 2021-02-09: qty 1000

## 2021-03-10 ENCOUNTER — Inpatient Hospital Stay
Admission: EM | Admit: 2021-03-10 | Discharge: 2021-03-11 | DRG: 640 | Disposition: A | Discharge status: Home / Self Care | Payer: BC Managed Care – PPO | Attending: Internal Medicine | Admitting: Internal Medicine

## 2021-03-10 ENCOUNTER — Other Ambulatory Visit: Payer: Self-pay

## 2021-03-10 ENCOUNTER — Encounter: Payer: Self-pay | Admitting: Emergency Medicine

## 2021-03-10 DIAGNOSIS — Z681 Body mass index (BMI) 19 or less, adult: Secondary | ICD-10-CM | POA: Diagnosis not present

## 2021-03-10 DIAGNOSIS — F1010 Alcohol abuse, uncomplicated: Secondary | ICD-10-CM | POA: Diagnosis present

## 2021-03-10 DIAGNOSIS — E876 Hypokalemia: Secondary | ICD-10-CM | POA: Diagnosis not present

## 2021-03-10 DIAGNOSIS — I7300 Raynaud's syndrome without gangrene: Secondary | ICD-10-CM | POA: Diagnosis present

## 2021-03-10 DIAGNOSIS — Z20822 Contact with and (suspected) exposure to covid-19: Secondary | ICD-10-CM | POA: Diagnosis present

## 2021-03-10 DIAGNOSIS — J45909 Unspecified asthma, uncomplicated: Secondary | ICD-10-CM | POA: Diagnosis present

## 2021-03-10 DIAGNOSIS — K219 Gastro-esophageal reflux disease without esophagitis: Secondary | ICD-10-CM | POA: Diagnosis present

## 2021-03-10 DIAGNOSIS — Z888 Allergy status to other drugs, medicaments and biological substances status: Secondary | ICD-10-CM | POA: Diagnosis not present

## 2021-03-10 DIAGNOSIS — N184 Chronic kidney disease, stage 4 (severe): Secondary | ICD-10-CM | POA: Diagnosis present

## 2021-03-10 DIAGNOSIS — F17210 Nicotine dependence, cigarettes, uncomplicated: Secondary | ICD-10-CM | POA: Diagnosis present

## 2021-03-10 DIAGNOSIS — G629 Polyneuropathy, unspecified: Secondary | ICD-10-CM | POA: Diagnosis present

## 2021-03-10 DIAGNOSIS — Z79899 Other long term (current) drug therapy: Secondary | ICD-10-CM | POA: Diagnosis not present

## 2021-03-10 DIAGNOSIS — E43 Unspecified severe protein-calorie malnutrition: Secondary | ICD-10-CM | POA: Diagnosis present

## 2021-03-10 DIAGNOSIS — E871 Hypo-osmolality and hyponatremia: Secondary | ICD-10-CM | POA: Diagnosis not present

## 2021-03-10 DIAGNOSIS — E8722 Chronic metabolic acidosis: Secondary | ICD-10-CM | POA: Diagnosis present

## 2021-03-10 DIAGNOSIS — F17290 Nicotine dependence, other tobacco product, uncomplicated: Secondary | ICD-10-CM | POA: Diagnosis present

## 2021-03-10 DIAGNOSIS — F32A Depression, unspecified: Secondary | ICD-10-CM | POA: Diagnosis present

## 2021-03-10 DIAGNOSIS — K5909 Other constipation: Secondary | ICD-10-CM | POA: Diagnosis present

## 2021-03-10 DIAGNOSIS — F419 Anxiety disorder, unspecified: Secondary | ICD-10-CM | POA: Diagnosis present

## 2021-03-10 DIAGNOSIS — E872 Acidosis, unspecified: Secondary | ICD-10-CM

## 2021-03-10 LAB — COMPREHENSIVE METABOLIC PANEL
ALT: 20 U/L (ref 0–44)
AST: 23 U/L (ref 15–41)
Albumin: 3.5 g/dL (ref 3.5–5.0)
Alkaline Phosphatase: 142 U/L — ABNORMAL HIGH (ref 38–126)
Anion gap: 16 — ABNORMAL HIGH (ref 5–15)
BUN: 54 mg/dL — ABNORMAL HIGH (ref 6–20)
CO2: 15 mmol/L — ABNORMAL LOW (ref 22–32)
Calcium: 8.7 mg/dL — ABNORMAL LOW (ref 8.9–10.3)
Chloride: 99 mmol/L (ref 98–111)
Creatinine, Ser: 2.1 mg/dL — ABNORMAL HIGH (ref 0.44–1.00)
GFR, Estimated: 28 mL/min — ABNORMAL LOW (ref >60)
Glucose, Bld: 107 mg/dL — ABNORMAL HIGH (ref 70–99)
Potassium: 2.4 mmol/L — CL (ref 3.5–5.1)
Sodium: 130 mmol/L — ABNORMAL LOW (ref 135–145)
Total Bilirubin: 0.5 mg/dL (ref 0.3–1.2)
Total Protein: 7.6 g/dL (ref 6.5–8.1)

## 2021-03-10 LAB — BLOOD GAS, VENOUS
Acid-base deficit: 13.4 mmol/L — ABNORMAL HIGH (ref 0.0–2.0)
Bicarbonate: 13.8 mmol/L — ABNORMAL LOW (ref 20.0–28.0)
O2 Saturation: 68 %
Patient temperature: 37
pCO2, Ven: 36 mmHg — ABNORMAL LOW (ref 44.0–60.0)
pH, Ven: 7.19 — CL (ref 7.250–7.430)
pO2, Ven: 45 mmHg (ref 32.0–45.0)

## 2021-03-10 LAB — CBC
HCT: 35.1 % — ABNORMAL LOW (ref 36.0–46.0)
Hemoglobin: 11.1 g/dL — ABNORMAL LOW (ref 12.0–15.0)
MCH: 30.7 pg (ref 26.0–34.0)
MCHC: 31.6 g/dL (ref 30.0–36.0)
MCV: 97.2 fL (ref 80.0–100.0)
Platelets: 434 10*3/uL — ABNORMAL HIGH (ref 150–400)
RBC: 3.61 MIL/uL — ABNORMAL LOW (ref 3.87–5.11)
RDW: 15.8 % — ABNORMAL HIGH (ref 11.5–15.5)
WBC: 12.1 10*3/uL — ABNORMAL HIGH (ref 4.0–10.5)
nRBC: 0 % (ref 0.0–0.2)

## 2021-03-10 LAB — RESP PANEL BY RT-PCR (FLU A&B, COVID) ARPGX2
Influenza A by PCR: NEGATIVE
Influenza B by PCR: NEGATIVE
SARS Coronavirus 2 by RT PCR: NEGATIVE

## 2021-03-10 LAB — LIPASE, BLOOD: Lipase: 37 U/L (ref 11–51)

## 2021-03-10 LAB — MAGNESIUM: Magnesium: 2.6 mg/dL — ABNORMAL HIGH (ref 1.7–2.4)

## 2021-03-10 MED ORDER — PANTOPRAZOLE SODIUM 40 MG PO TBEC
40.0000 mg | DELAYED_RELEASE_TABLET | Freq: Every day | ORAL | Status: DC
  Administered 2021-03-11: 40 mg via ORAL
  Filled 2021-03-10: qty 1

## 2021-03-10 MED ORDER — PROMETHAZINE HCL 25 MG PO TABS: 25.0000 mg | ORAL_TABLET | Freq: Three times a day (TID) | ORAL | Status: DC | PRN

## 2021-03-10 MED ORDER — LORATADINE 10 MG PO TABS
10.0000 mg | ORAL_TABLET | Freq: Every day | ORAL | Status: DC | PRN
  Filled 2021-03-10: qty 1

## 2021-03-10 MED ORDER — HEPARIN SODIUM (PORCINE) 5000 UNIT/ML IJ SOLN
5000.0000 [IU] | Freq: Two times a day (BID) | INTRAMUSCULAR | Status: DC
  Administered 2021-03-10 – 2021-03-11 (×2): 5000 [IU] via SUBCUTANEOUS
  Filled 2021-03-10 (×2): qty 1

## 2021-03-10 MED ORDER — ENSURE ENLIVE PO LIQD: 237.0000 mL | Freq: Two times a day (BID) | ORAL | Status: DC

## 2021-03-10 MED ORDER — BRIMONIDINE TARTRATE 0.025 % OP SOLN: 1.0000 [drp] | Freq: Every day | OPHTHALMIC | Status: DC

## 2021-03-10 MED ORDER — SODIUM BICARBONATE 8.4 % IV SOLN
INTRAVENOUS | Status: DC
  Filled 2021-03-10: qty 150
  Filled 2021-03-10 (×2): qty 1000

## 2021-03-10 MED ORDER — PLECANATIDE 3 MG PO TABS: 3.0000 mg | ORAL_TABLET | Freq: Every day | ORAL | Status: DC

## 2021-03-10 MED ORDER — THEOPHYLLINE ER 400 MG PO TB24
400.0000 mg | ORAL_TABLET | Freq: Two times a day (BID) | ORAL | Status: DC
  Administered 2021-03-10 – 2021-03-11 (×2): 400 mg via ORAL
  Filled 2021-03-10 (×3): qty 1

## 2021-03-10 MED ORDER — FLUTICASONE FUROATE-VILANTEROL 200-25 MCG/ACT IN AEPB
1.0000 | INHALATION_SPRAY | Freq: Every day | RESPIRATORY_TRACT | Status: DC
  Filled 2021-03-10: qty 28

## 2021-03-10 MED ORDER — FLUTICASONE PROPIONATE 50 MCG/ACT NA SUSP
2.0000 | Freq: Two times a day (BID) | NASAL | Status: DC
Start: 2021-03-10 — End: 2021-03-10

## 2021-03-10 MED ORDER — HYDROXYZINE HCL 25 MG PO TABS
25.0000 mg | ORAL_TABLET | Freq: Every day | ORAL | Status: DC
  Administered 2021-03-10: 25 mg via ORAL
  Filled 2021-03-10: qty 1

## 2021-03-10 MED ORDER — METOCLOPRAMIDE HCL 10 MG PO TABS
10.0000 mg | ORAL_TABLET | Freq: Two times a day (BID) | ORAL | Status: DC
  Administered 2021-03-10 – 2021-03-11 (×2): 10 mg via ORAL
  Filled 2021-03-10 (×3): qty 1

## 2021-03-10 MED ORDER — FAMOTIDINE 20 MG PO TABS
40.0000 mg | ORAL_TABLET | Freq: Every day | ORAL | Status: DC
  Administered 2021-03-10: 40 mg via ORAL
  Filled 2021-03-10: qty 2

## 2021-03-10 MED ORDER — THIAMINE HCL 100 MG PO TABS
50.0000 mg | ORAL_TABLET | Freq: Every day | ORAL | Status: DC
  Administered 2021-03-11: 50 mg via ORAL
  Filled 2021-03-10: qty 1

## 2021-03-10 MED ORDER — PSYLLIUM 95 % PO PACK
PACK | Freq: Every day | ORAL | Status: DC
  Filled 2021-03-10: qty 1

## 2021-03-10 MED ORDER — POTASSIUM CHLORIDE 10 MEQ/100ML IV SOLN: 10.0000 meq | INTRAVENOUS | Status: DC

## 2021-03-10 MED ORDER — MELATONIN 5 MG PO TABS
5.0000 mg | ORAL_TABLET | Freq: Every day | ORAL | Status: DC
  Administered 2021-03-10: 5 mg via ORAL
  Filled 2021-03-10: qty 1

## 2021-03-10 MED ORDER — DULOXETINE HCL 20 MG PO CPEP
20.0000 mg | ORAL_CAPSULE | Freq: Every day | ORAL | Status: DC
  Administered 2021-03-11: 20 mg via ORAL
  Filled 2021-03-10 (×2): qty 1

## 2021-03-10 MED ORDER — FLUOCINONIDE 0.05 % EX CREA
1.0000 "application " | TOPICAL_CREAM | Freq: Two times a day (BID) | CUTANEOUS | Status: DC | PRN
  Filled 2021-03-10: qty 30

## 2021-03-10 MED ORDER — POTASSIUM CHLORIDE CRYS ER 20 MEQ PO TBCR
20.0000 meq | EXTENDED_RELEASE_TABLET | Freq: Two times a day (BID) | ORAL | Status: DC
  Administered 2021-03-10 – 2021-03-11 (×2): 20 meq via ORAL
  Filled 2021-03-10 (×2): qty 1

## 2021-03-10 MED ORDER — ONDANSETRON HCL 4 MG PO TABS: 8.0000 mg | ORAL_TABLET | Freq: Three times a day (TID) | ORAL | Status: DC | PRN

## 2021-03-10 MED ORDER — FLUOCINONIDE 0.05 % EX CREA: 1.0000 "application " | TOPICAL_CREAM | Freq: Two times a day (BID) | CUTANEOUS | Status: DC

## 2021-03-10 MED ORDER — FLUTICASONE PROPIONATE 50 MCG/ACT NA SUSP
2.0000 | Freq: Two times a day (BID) | NASAL | Status: DC | PRN
  Administered 2021-03-11: 2 via NASAL
  Filled 2021-03-10 (×2): qty 16

## 2021-03-10 MED ORDER — ALBUTEROL SULFATE (2.5 MG/3ML) 0.083% IN NEBU: 3.0000 mL | INHALATION_SOLUTION | RESPIRATORY_TRACT | Status: DC | PRN

## 2021-03-10 MED ORDER — SOD CITRATE-CITRIC ACID 500-334 MG/5ML PO SOLN
15.0000 mL | Freq: Three times a day (TID) | ORAL | Status: DC
  Administered 2021-03-10 – 2021-03-11 (×2): 15 mL via ORAL
  Filled 2021-03-10 (×6): qty 15

## 2021-03-10 MED ORDER — POTASSIUM CHLORIDE CRYS ER 20 MEQ PO TBCR
40.0000 meq | EXTENDED_RELEASE_TABLET | Freq: Once | ORAL | Status: DC
Start: 2021-03-10 — End: 2021-03-10

## 2021-03-10 MED ORDER — OXYBUTYNIN CHLORIDE ER 10 MG PO TB24
10.0000 mg | ORAL_TABLET | Freq: Every day | ORAL | Status: DC
  Filled 2021-03-10: qty 1

## 2021-03-10 MED ORDER — BISACODYL 5 MG PO TBEC: 10.0000 mg | DELAYED_RELEASE_TABLET | Freq: Every evening | ORAL | Status: DC | PRN

## 2021-03-10 MED ORDER — POTASSIUM CHLORIDE 10 MEQ/100ML IV SOLN
10.0000 meq | INTRAVENOUS | Status: AC
Start: 2021-03-10 — End: 2021-03-10
  Administered 2021-03-10 (×5): 10 meq via INTRAVENOUS
  Filled 2021-03-10 (×2): qty 100

## 2021-03-10 MED ORDER — PENTOSAN POLYSULFATE SODIUM 100 MG PO CAPS
100.0000 mg | ORAL_CAPSULE | Freq: Every day | ORAL | Status: DC
  Administered 2021-03-10: 100 mg via ORAL
  Filled 2021-03-10 (×2): qty 1

## 2021-03-10 MED ORDER — TAMSULOSIN HCL 0.4 MG PO CAPS
0.4000 mg | ORAL_CAPSULE | Freq: Every day | ORAL | Status: DC
  Administered 2021-03-10: 0.4 mg via ORAL
  Filled 2021-03-10: qty 1

## 2021-03-10 MED ORDER — DOXEPIN HCL 25 MG PO CAPS
25.0000 mg | ORAL_CAPSULE | Freq: Every day | ORAL | Status: DC
  Administered 2021-03-10: 25 mg via ORAL
  Filled 2021-03-10 (×2): qty 1

## 2021-03-10 MED ORDER — GABAPENTIN 300 MG PO CAPS
300.0000 mg | ORAL_CAPSULE | Freq: Two times a day (BID) | ORAL | Status: DC
  Administered 2021-03-10: 300 mg via ORAL
  Filled 2021-03-10 (×3): qty 1

## 2021-03-10 MED ORDER — AMITRIPTYLINE HCL 50 MG PO TABS
25.0000 mg | ORAL_TABLET | Freq: Every day | ORAL | Status: DC
  Administered 2021-03-10: 25 mg via ORAL
  Filled 2021-03-10: qty 1

## 2021-03-10 MED ORDER — TRAMADOL HCL 50 MG PO TABS
50.0000 mg | ORAL_TABLET | Freq: Four times a day (QID) | ORAL | Status: DC | PRN
  Administered 2021-03-11: 50 mg via ORAL
  Filled 2021-03-10: qty 1

## 2021-03-10 NOTE — H&P (Signed)
History and Physical    Katherine Perez ZRA:[id number] DOB: 1970/06/21 DOA: 03/10/2021  PCP: Gladstone Lighter, MD (Confirm with patient/family/NH records and if not entered, this has to be entered at [facility] point of entry) Patient coming from: Home  I have personally briefly reviewed patient's old medical records in [place]  Chief Complaint: Feeling weak  HPI: Katherine Perez is a 50 y.o. 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  Patient reported "feeling generalized weakness for weeks", and was found to have profound hypokalemia and hyponatremia.  Her nephrologist Dr. Zollie Scale has been managed to replenish potassium with p.o. supplement for about 6 weeks and today's blood work showed K=2.4.  Continue to experience generalized weakness, but denied any muscle aching or chest pains.  She takes multiple potassium supplement, and she has chronic constipation, for which she uses Dulcolax 10 mg daily and she has loose bowel movement 1-2 times every day.  ED Course: Blood pressure borderline low, no tachycardia.  Repeat lab K2.4, sodium 130, creatinine 2.1, CO215  Review of Systems: As per HPI otherwise 14 point review of systems negative.    Past Medical History:  Diagnosis Date   Anemia    Asthma    CKD (chronic kidney disease)    ETOH abuse    GERD (gastroesophageal reflux disease)    IC (interstitial cystitis)    IDA (iron deficiency anemia) 10/07/2020   Laxative abuse    Neuropathy    Pneumonia    Raynaud disease     Past Surgical History:  Procedure Laterality Date   COLONOSCOPY     ERCP N/A 07/01/2020   Procedure: ENDOSCOPIC RETROGRADE CHOLANGIOPANCREATOGRAPHY (ERCP);  Surgeon: Lucilla Lame, MD;  Location: [facility] ENDOSCOPY;  Service: Endoscopy;  Laterality: N/A;   ERCP N/A 10/18/2020   Procedure:  ENDOSCOPIC RETROGRADE CHOLANGIOPANCREATOGRAPHY (ERCP) STENT REMOVAL;  Surgeon: Lucilla Lame, MD;  Location: [facility] ENDOSCOPY;  Service: Endoscopy;  Laterality: N/A;   ESOPHAGOGASTRODUODENOSCOPY     FRACTURE SURGERY     femur fracture left - 2019   LEG SURGERY       reports that she has been smoking cigarettes. She has been smoking an average of 1 pack per day. She has never used smokeless tobacco. She reports current alcohol use. She reports current drug use. Drug: Marijuana.  Allergies  Allergen Reactions   Cetirizine Hives   Diazepam Other (See Comments)    Depression    Baclofen Anxiety and Other (See Comments)    AMS - "Really messed me up, caused me to drop things"     Family History  Problem Relation Age of Onset   Other Neg Hx      Prior to Admission medications   Medication Sig Start Date End Date Taking? Authorizing Provider  albuterol (VENTOLIN HFA) 108 (90 Base) MCG/ACT inhaler Inhale 2 puffs into the lungs every 4 (four) hours as needed for shortness of breath or wheezing. 06/07/19   [provider]  amitriptyline (ELAVIL) 25 MG tablet Take 25-50 mg by mouth at bedtime. 06/13/20   [provider]  bisacodyl (DULCOLAX) 5 MG EC tablet Take 2 tablets (10 mg total) by mouth at bedtime as needed for moderate constipation. 07/06/20 07/06/21  Val Riles, MD  Brimonidine Tartrate 0.025 % SOLN Place 1 drop into both eyes daily.    [provider]  doxepin (SINEQUAN) 25 MG capsule  Take 25-50 mg by mouth at bedtime. 07/19/20   [provider]  DULoxetine (CYMBALTA) 20 MG capsule Take 20 mg by mouth daily. 09/11/20   [provider]  DULoxetine (CYMBALTA) 20 MG capsule Take 20 mg by mouth daily. 12/16/20 12/16/21  [provider]  Dupilumab (DUPIXENT [place]) Inject 300 mg into the skin once a week.    [provider]  famotidine (PEPCID) 40 MG tablet Take 40 mg by mouth at bedtime. 10/07/19   [provider]   fexofenadine (ALLEGRA) 180 MG tablet Take 180 mg by mouth daily as needed for allergies.    [provider]  FIBER PO Take 1 capsule by mouth daily.    [provider]  fluocinonide cream (LIDEX) 6.25 % Apply 1 application topically 2 (two) times daily. 07/28/20   [provider]  fluticasone (FLONASE) 50 MCG/ACT nasal spray Place 2 sprays into both nostrils 2 (two) times daily.  09/14/19   [provider]  Fluticasone-Salmeterol (ADVAIR) 250-50 MCG/DOSE AEPB Inhale 1 puff into the lungs 2 (two) times daily. 09/14/19   [provider]  gabapentin (NEURONTIN) 300 MG capsule Take 1 capsule (300 mg total) by mouth 2 (two) times daily. 08/12/20   Sharen Hones, MD  hydrOXYzine (ATARAX/VISTARIL) 25 MG tablet Take 25-50 mg by mouth at bedtime.    [provider]  iron polysaccharides (NIFEREX) 150 MG capsule Take 150 mg by mouth daily. Patient not taking: Reported on 01/09/2021    [provider]  KLOR-CON M20 20 MEQ tablet Take 20 mEq by mouth 2 (two) times daily. 07/26/20   [provider]  medroxyPROGESTERone (DEPO-PROVERA) 150 MG/ML injection Inject 150 mg into the muscle every 3 (three) months.    [provider]  melatonin 3 MG TABS tablet Take 2 tablets (6 mg total) by mouth at bedtime. 07/06/20   Val Riles, MD  Meth-Hyo-M [place]ett Hatter Phos-Ph Sal (URIBEL) 118 MG CAPS Take 1 capsule by mouth daily. 06/21/20   [provider]  metoCLOPramide (REGLAN) 10 MG tablet Take 10 mg by mouth in the morning and at bedtime. 10/17/19   [provider]  ondansetron (ZOFRAN) 8 MG tablet Take 8 mg by mouth every 8 (eight) hours as needed for vomiting or nausea. 10/23/19   [provider]  oxybutynin (DITROPAN-XL) 10 MG 24 hr tablet Take 10 mg by mouth daily at 12 noon. 04/17/20   [provider]  pentosan polysulfate (ELMIRON) 100 MG capsule Take 100 mg by mouth at bedtime.    [provider]  promethazine  (PHENERGAN) 25 MG tablet Take 25 mg by mouth every 8 (eight) hours as needed for nausea or vomiting.  10/23/19   [provider]  RABEprazole (ACIPHEX) 20 MG tablet Take by mouth. 09/20/20   [provider]  sodium citrate-citric acid (ORACIT) 500-334 MG/5ML solution Take 15 mLs by mouth in the morning and at bedtime. 05/30/20   [provider]  tamsulosin (FLOMAX) 0.4 MG CAPS capsule Take 0.4 mg by mouth at bedtime. 10/10/19   [provider]  theophylline (UNIPHYL) 400 MG 24 hr tablet Take 400 mg by mouth 2 (two) times daily.  10/10/19   [provider]  thiamine (VITAMIN B-1) 50 MG tablet Take 50 mg by mouth daily.    [provider]  traMADol (ULTRAM) 50 MG tablet Take 50 mg by mouth every 6 (six) hours as needed for moderate pain.    [provider]  trimethoprim (TRIMPEX) 100  MG tablet Take 100 mg by mouth daily. 09/24/20   [provider]  TRULANCE 3 MG TABS Take 3 mg by mouth daily at 6 (six) AM.  10/12/19   [provider]    Physical Exam: Vitals:   03/10/21 1019 03/10/21 1051  BP:  100/67  Pulse:  79  Resp:  18  Temp:  99 F (37.2 C)  TempSrc:  Axillary  SpO2:  100%  Weight: 56.7 kg   Height: 5\' 9"  (1.753 m)     Constitutional: NAD, calm, comfortable Vitals:   03/10/21 1019 03/10/21 1051  BP:  100/67  Pulse:  79  Resp:  18  Temp:  99 F (37.2 C)  TempSrc:  Axillary  SpO2:  100%  Weight: 56.7 kg   Height: 5\' 9"  (1.753 m)    Eyes: PERRL, lids and conjunctivae normal ENMT: Mucous membranes are moist. Posterior pharynx clear of any exudate or lesions.Normal dentition.  Neck: normal, supple, no masses, no thyromegaly Respiratory: clear to auscultation bilaterally, no wheezing, no crackles. Normal respiratory effort. No accessory muscle use.  Cardiovascular: Regular rate and rhythm, no murmurs / rubs / gallops. No extremity edema. 2+ pedal pulses. No carotid bruits.  Abdomen: no tenderness, no  masses palpated. No hepatosplenomegaly. Bowel sounds positive.  Musculoskeletal: no clubbing / cyanosis. No joint deformity upper and lower extremities. Good ROM, no contractures. Normal muscle tone.  Skin: no rashes, lesions, ulcers. No induration Neurologic: CN 2-12 grossly intact. Sensation intact, DTR normal. Strength 5/5 in all 4.  Psychiatric: Normal judgment and insight. Alert and oriented x 3. Normal mood.     Labs on Admission: I have personally reviewed following labs and imaging studies  CBC: Recent Labs  Lab 03/10/21 1020  WBC 12.1*  HGB 11.1*  HCT 35.1*  MCV 97.2  PLT 703*   Basic Metabolic Panel: Recent Labs  Lab 03/10/21 1020  NA 130*  K 2.4*  CL 99  CO2 15*  GLUCOSE 107*  BUN 54*  CREATININE 2.10*  CALCIUM 8.7*  MG 2.6*   GFR: Estimated Creatinine Clearance: 29 mL/min (A) (by C-G formula based on SCr of 2.1 mg/dL (H)). Liver Function Tests: Recent Labs  Lab 03/10/21 1020  AST 23  ALT 20  ALKPHOS 142*  BILITOT 0.5  PROT 7.6  ALBUMIN 3.5   Recent Labs  Lab 03/10/21 1020  LIPASE 37   No results for input(s): AMMONIA in the last 168 hours. Coagulation Profile: No results for input(s): INR, PROTIME in the last 168 hours. Cardiac Enzymes: No results for input(s): CKTOTAL, CKMB, CKMBINDEX, TROPONINI in the last 168 hours. BNP (last 3 results) No results for input(s): PROBNP in the last 8760 hours. HbA1C: No results for input(s): HGBA1C in the last 72 hours. CBG: No results for input(s): GLUCAP in the last 168 hours. Lipid Profile: No results for input(s): CHOL, HDL, LDLCALC, TRIG, CHOLHDL, LDLDIRECT in the last 72 hours. Thyroid Function Tests: No results for input(s): TSH, T4TOTAL, FREET4, T3FREE, THYROIDAB in the last 72 hours. Anemia Panel: No results for input(s): VITAMINB12, FOLATE, FERRITIN, TIBC, IRON, RETICCTPCT in the last 72 hours. Urine analysis:    Component Value Date/Time   COLORURINE YELLOW (A) 08/09/2020 1142    APPEARANCEUR HAZY (A) 08/09/2020 1142   LABSPEC 1.005 08/09/2020 1142   PHURINE 6.0 08/09/2020 1142   GLUCOSEU NEGATIVE 08/09/2020 1142   HGBUR MODERATE (A) 08/09/2020 1142   BILIRUBINUR NEGATIVE 08/09/2020 1142   KETONESUR NEGATIVE 08/09/2020 1142   PROTEINUR NEGATIVE  08/09/2020 1142   NITRITE NEGATIVE 08/09/2020 1142   LEUKOCYTESUR LARGE (A) 08/09/2020 1142    Radiological Exams on Admission: No results found.  EKG: Independently reviewed.  Sinus rhythm, no prolonged QRS, QT or significant ST-T changes.  Assessment/Plan Principal Problem:   Hypokalemia  (please populate well all problems here in Problem List. (For example, if patient is on BP meds at home and you resume or decide to hold them, it is a problem that needs to be her. Same for CAD, COPD, HLD and so on)  Refractory hypokalemia, failed outpatient management -Treatment plan as per nephrology Dr. Holley Raring, IV K replacement x6, recheck BMP tonight and tomorrow morning. 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 -Also discussed with patient regarding use of laxative, patient however reported that she has severe constipation, and tried multiple stool softener and laxative, and Dulcolax is only one that works and she has been on it for at least 7 years.  Profound metabolic acidosis -With stable Cre level -Proceed with bicarb drip for now.  Continue sodium citrate supplement.  Hyponatremia -Euvolemic, on bicarb drip to correct acidosis, recheck BMP tomorrow.  Laxative overuse -Discussed with patient as above.  Asthma -Stable, continue home breathing regimen.  Severe protein calorie malnutrition -Start Ensure, outpatient PCP follow-up.  Anxiety depression -Continue SSRI.  DVT prophylaxis: Heparin subcu Code Status: Full Family Communication: None at bedside Disposition Plan: Expect 1 to 2 days hospital stay for IV and p.o. K replacement. Consults called: Nephrology Admission status: Telemetry  admit   Lequita Halt MD [facility] Pager [phone]  03/10/2021, 2:07 PM

## 2021-03-10 NOTE — ED Provider Notes (Signed)
[facility] Emergency Department Provider Note  ____________________________________________   Event Date/Time   First MD Initiated Contact with Patient 03/10/21 1031     (approximate)  I have reviewed the triage vital signs and the nursing notes.   HISTORY  Chief Complaint Abnormal Lab    HPI Lynley Killilea is a 50 y.o. female with CKD who comes in with concerns for abnormal labs.  Patient was seen by her nephrologist Dr. Holley Raring yesterday.  She was noted to be hyponatremic with a sodium of 921 metabolic acidosis with a bicarb of 15 and hypokalemia with a K of 2.7.  They have been attempting to replete her potassium through the IV as well as she takes 20 mg p.o. 3 times daily.  Unfortunately however stopped helping.  Therefore per nephrology's request patient should be admitted for further IV repletion.  Patient states that she feels at her normal self now but sometimes her legs get really abnormal she starts to feel a little confused.  She states that they are catching up before gets that point.  They report that she is required admission 6 times over the past few years for similar.  She understands that she was sent here today for admission.          Past Medical History:  Diagnosis Date   Anemia    Asthma    CKD (chronic kidney disease)    ETOH abuse    GERD (gastroesophageal reflux disease)    IC (interstitial cystitis)    IDA (iron deficiency anemia) 10/07/2020   Laxative abuse    Neuropathy    Pneumonia    Raynaud disease     Patient Active Problem List   Diagnosis Date Noted   Encounter for adjustment or removal of myringotomy device (stent) (tube)    IDA (iron deficiency anemia) 10/07/2020   AKI (acute kidney injury) ([place]) 19/41/7408   Metabolic acidosis 14/48/1856   Other specified diseases of biliary tract    Disease of biliary tract, unspecified    Bile leak 06/30/2020   Sepsis ([place]) 06/29/2020   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    Hyperkalemia 06/27/2020   CKD (chronic kidney disease) stage 3, GFR 30-59 ml/min ([place]) 06/27/2020   Acute pancreatitis without infection or necrosis 04/23/2020   GERD without esophagitis 04/23/2020   Hypokalemia 04/23/2020   Prolonged QT interval 04/23/2020   Hypercalcemia    Pressure injury of skin 10/27/2019   Hyponatremia 10/26/2019   Increased anion gap metabolic acidosis 31/49/7026   Acute renal failure superimposed on stage 4 chronic kidney disease ([place]) 37/85/8850   Acute metabolic encephalopathy 27/74/1287   Leukocytosis 10/26/2019    Past Surgical History:  Procedure Laterality Date   COLONOSCOPY     ERCP N/A 07/01/2020   Procedure: ENDOSCOPIC RETROGRADE CHOLANGIOPANCREATOGRAPHY (ERCP);  Surgeon: Lucilla Lame, MD;  Location: [facility] ENDOSCOPY;  Service: Endoscopy;  Laterality: N/A;   ERCP N/A 10/18/2020   Procedure: ENDOSCOPIC RETROGRADE CHOLANGIOPANCREATOGRAPHY (ERCP) STENT REMOVAL;  Surgeon: Lucilla Lame, MD;  Location: [facility] ENDOSCOPY;  Service: Endoscopy;  Laterality: N/A;   ESOPHAGOGASTRODUODENOSCOPY     FRACTURE SURGERY     femur fracture left - 2019   LEG SURGERY      Prior to Admission medications   Medication Sig Start Date End Date Taking? Authorizing Provider  albuterol (VENTOLIN HFA) 108 (90 Base) MCG/ACT inhaler Inhale 2 puffs into the lungs every 4 (four) hours as needed for shortness of breath or wheezing. 06/07/19   [provider]  amitriptyline (ELAVIL) 25 MG tablet Take 25-50 mg by mouth at bedtime. 06/13/20   [provider]  bisacodyl (DULCOLAX) 5 MG EC tablet Take 2 tablets (10 mg total) by mouth at bedtime as needed for moderate constipation. 07/06/20 07/06/21  Val Riles, MD  Brimonidine Tartrate 0.025 % SOLN Place 1 drop into both eyes daily.    [provider]  doxepin (SINEQUAN) 25 MG capsule Take 25-50 mg by mouth at bedtime. 07/19/20   [provider]  DULoxetine (CYMBALTA) 20 MG capsule Take 20 mg by mouth daily.  09/11/20   [provider]  DULoxetine (CYMBALTA) 20 MG capsule Take 20 mg by mouth daily. 12/16/20 12/16/21  [provider]  Dupilumab (DUPIXENT [place]) Inject 300 mg into the skin once a week.    [provider]  famotidine (PEPCID) 40 MG tablet Take 40 mg by mouth at bedtime. 10/07/19   [provider]  fexofenadine (ALLEGRA) 180 MG tablet Take 180 mg by mouth daily as needed for allergies.    [provider]  FIBER PO Take 1 capsule by mouth daily.    [provider]  fluocinonide cream (LIDEX) 3.26 % Apply 1 application topically 2 (two) times daily. 07/28/20   [provider]  fluticasone (FLONASE) 50 MCG/ACT nasal spray Place 2 sprays into both nostrils 2 (two) times daily.  09/14/19   [provider]  Fluticasone-Salmeterol (ADVAIR) 250-50 MCG/DOSE AEPB Inhale 1 puff into the lungs 2 (two) times daily. 09/14/19   [provider]  gabapentin (NEURONTIN) 300 MG capsule Take 1 capsule (300 mg total) by mouth 2 (two) times daily. 08/12/20   Sharen Hones, MD  hydrOXYzine (ATARAX/VISTARIL) 25 MG tablet Take 25-50 mg by mouth at bedtime.    [provider]  iron polysaccharides (NIFEREX) 150 MG capsule Take 150 mg by mouth daily. Patient not taking: Reported on 01/09/2021    [provider]  KLOR-CON M20 20 MEQ tablet Take 20 mEq by mouth 2 (two) times daily. 07/26/20   [provider]  medroxyPROGESTERone (DEPO-PROVERA) 150 MG/ML injection Inject 150 mg into the muscle every 3 (three) months.    [provider]  melatonin 3 MG TABS tablet Take 2 tablets (6 mg total) by mouth at bedtime. 07/06/20   Val Riles, MD  Meth-Hyo-M [place] Hatter Phos-Ph Sal (URIBEL) 118 MG CAPS Take 1 capsule by mouth daily. 06/21/20   [provider]  metoCLOPramide (REGLAN) 10 MG tablet Take 10 mg by mouth in the morning and at bedtime. 10/17/19   [provider]  ondansetron (ZOFRAN) 8 MG tablet Take 8 mg by  mouth every 8 (eight) hours as needed for vomiting or nausea. 10/23/19   [provider]  oxybutynin (DITROPAN-XL) 10 MG 24 hr tablet Take 10 mg by mouth daily at 12 noon. 04/17/20   [provider]  pentosan polysulfate (ELMIRON) 100 MG capsule Take 100 mg by mouth at bedtime.    [provider]  promethazine (PHENERGAN) 25 MG tablet Take 25 mg by mouth every 8 (eight) hours as needed for nausea or vomiting.  10/23/19   [provider]  RABEprazole (ACIPHEX) 20 MG tablet Take by mouth. 09/20/20   [provider]  sodium citrate-citric acid (ORACIT) 500-334 MG/5ML solution Take 15 mLs by mouth in the morning and at bedtime. 05/30/20   [provider]  tamsulosin (FLOMAX) 0.4 MG CAPS capsule Take 0.4 mg by mouth at bedtime. 10/10/19   [provider]  theophylline (  UNIPHYL) 400 MG 24 hr tablet Take 400 mg by mouth 2 (two) times daily.  10/10/19   [provider]  thiamine (VITAMIN B-1) 50 MG tablet Take 50 mg by mouth daily.    [provider]  traMADol (ULTRAM) 50 MG tablet Take 50 mg by mouth every 6 (six) hours as needed for moderate pain.    [provider]  trimethoprim (TRIMPEX) 100 MG tablet Take 100 mg by mouth daily. 09/24/20   [provider]  TRULANCE 3 MG TABS Take 3 mg by mouth daily at 6 (six) AM.  10/12/19   [provider]    Allergies Cetirizine, Diazepam, and Baclofen  Family History  Problem Relation Age of Onset   Other Neg Hx     Social History Social History   Tobacco Use   Smoking status: Every Day    Packs/day: 1.00    Types: Cigarettes   Smokeless tobacco: Never  Vaping Use   Vaping Use: Some days  Substance Use Topics   Alcohol use: Yes    Comment: last drink 03/2020   Drug use: Yes    Types: Marijuana      Review of Systems Constitutional: No fever/chills Eyes: No visual changes. ENT: No sore throat. Cardiovascular: Denies chest pain. Respiratory:  Denies shortness of breath. Gastrointestinal: No abdominal pain.  No nausea, no vomiting.  No diarrhea.  No constipation. Genitourinary: Negative for dysuria. Musculoskeletal: Negative for back pain. Skin: Negative for rash. Neurological: Negative for headaches, focal weakness or numbness. All other ROS negative ____________________________________________   PHYSICAL EXAM:  VITAL SIGNS: ED Triage Vitals [03/10/21 1019]  Enc Vitals Group     BP      Pulse      Resp      Temp      Temp src      SpO2      Weight 125 lb (56.7 kg)     Height 5\' 9"  (1.753 m)     Head Circumference      Peak Flow      Pain Score 7     Pain Loc      Pain Edu?      Excl. in [place]?     Constitutional: Alert and oriented. Well appearing and in no acute distress. Eyes: Conjunctivae are normal. EOMI. Head: Atraumatic. Nose: No congestion/rhinnorhea. Mouth/Throat: Mucous membranes are moist.   Neck: No stridor. Trachea Midline. FROM Cardiovascular: Normal rate, regular rhythm. Grossly normal heart sounds.  Good peripheral circulation. Respiratory: Normal respiratory effort.  No retractions. Lungs CTAB. Gastrointestinal: Soft and nontender. No distention. No abdominal bruits.  Musculoskeletal: No lower extremity tenderness nor edema.  No joint effusions. Neurologic:  Normal speech and language. No gross focal neurologic deficits are appreciated.  Skin:  Skin is warm, dry and intact. No rash noted. Psychiatric: Mood and affect are normal. Speech and behavior are normal. GU: Deferred   ____________________________________________   LABS (all labs ordered are listed, but only abnormal results are displayed)  Labs Reviewed  CBC - Abnormal; Notable for the following components:      Result Value   WBC 12.1 (*)    RBC 3.61 (*)    Hemoglobin 11.1 (*)    HCT 35.1 (*)    RDW 15.8 (*)    Platelets 434 (*)    All other components within normal limits  LIPASE, BLOOD  COMPREHENSIVE METABOLIC PANEL   URINALYSIS, ROUTINE W REFLEX MICROSCOPIC  MAGNESIUM   ____________________________________________  ED ECG REPORT I, Vanessa [place], the attending physician, personally viewed and interpreted this ECG.  Normal sinus rate of 88, no ST elevation, no T wave inversions except for lead III, normal intervals ____________________________________________  PROCEDURES  Procedure(s) performed (including Critical Care):  Procedures   ____________________________________________   INITIAL IMPRESSION / ASSESSMENT AND PLAN / ED COURSE  Greidys Deland was evaluated in Emergency Department on 03/10/2021 for the symptoms described in the history of present illness. She was evaluated in the context of the global COVID-19 pandemic, which necessitated consideration that the patient might be at risk for infection with the SARS-CoV-2 virus that causes COVID-19. Institutional protocols and algorithms that pertain to the evaluation of patients at risk for COVID-19 are in a state of rapid change based on information released by regulatory bodies including the CDC and federal and state organizations. These policies and algorithms were followed during the patient's care in the ED.     Patient comes in with concerns for electrolyte abnormalities requiring admission per nephrology.  We will recheck labs today to evaluate for hypokalemia, acidosis, hyponatremia, AKI.   Magnesium is normal at 2.6.  Her sodium is low at 130 with a K of 2.5 and a bicarb of 15.  Her creatinine is 2.1  Discussed with Dr. Holley Raring from nephrology who recommended a sodium bicarb drip with D5 at 50 mils per hour as well as 6-10 mill equivalents of  IV K  I discussed the hospital team for admission     ____________________________________________   FINAL CLINICAL IMPRESSION(S) / ED DIAGNOSES   Final diagnoses:  Metabolic acidosis  Hypokalemia      MEDICATIONS GIVEN DURING THIS VISIT:  Medications  sodium bicarbonate 150  mEq in dextrose 5 % 1,150 mL infusion (has no administration in time range)  potassium chloride 10 mEq in 100 mL IVPB (has no administration in time range)     ED Discharge Orders     None        Note:  This document was prepared using Dragon voice recognition software and may include unintentional dictation errors.    Vanessa Corazon, MD 03/10/21 1225

## 2021-03-10 NOTE — ED Triage Notes (Signed)
Pt comes into the ED via POV sent over by her MD for abnormal labs.  Pt states her K+ was 2.7, and Na 129, and bicarb read low.  Pt state she has been sick for years, they work on getting her k+ back up and then it will drop back down again.  Pt denies any weakness, SHOB, chest pain, etc.  PT ambulatory to triage at this time.  Pt was seen here 3 weeks ago.

## 2021-03-10 NOTE — ED Notes (Signed)
Rn attempted IV multiple times on left arm the patient refusing to allow access on the right side. No IV access at this time. Will get second attempt from another RN,.

## 2021-03-11 DIAGNOSIS — E872 Acidosis, unspecified: Secondary | ICD-10-CM

## 2021-03-11 LAB — BASIC METABOLIC PANEL
Anion gap: 8 (ref 5–15)
Anion gap: 8 (ref 5–15)
BUN: 47 mg/dL — ABNORMAL HIGH (ref 6–20)
BUN: 44 mg/dL — ABNORMAL HIGH (ref 6–20)
CO2: 16 mmol/L — ABNORMAL LOW (ref 22–32)
CO2: 16 mmol/L — ABNORMAL LOW (ref 22–32)
Calcium: 7.8 mg/dL — ABNORMAL LOW (ref 8.9–10.3)
Calcium: 7.7 mg/dL — ABNORMAL LOW (ref 8.9–10.3)
Chloride: 103 mmol/L (ref 98–111)
Chloride: 108 mmol/L (ref 98–111)
Creatinine, Ser: 1.91 mg/dL — ABNORMAL HIGH (ref 0.44–1.00)
Creatinine, Ser: 1.91 mg/dL — ABNORMAL HIGH (ref 0.44–1.00)
GFR, Estimated: 32 mL/min — ABNORMAL LOW (ref >60)
GFR, Estimated: 32 mL/min — ABNORMAL LOW (ref >60)
Glucose, Bld: 127 mg/dL — ABNORMAL HIGH (ref 70–99)
Glucose, Bld: 82 mg/dL (ref 70–99)
Potassium: 2 mmol/L — CL (ref 3.5–5.1)
Potassium: 2.8 mmol/L — ABNORMAL LOW (ref 3.5–5.1)
Sodium: 127 mmol/L — ABNORMAL LOW (ref 135–145)
Sodium: 132 mmol/L — ABNORMAL LOW (ref 135–145)

## 2021-03-11 LAB — CBC
HCT: 27.4 % — ABNORMAL LOW (ref 36.0–46.0)
Hemoglobin: 8.8 g/dL — ABNORMAL LOW (ref 12.0–15.0)
MCH: 30.7 pg (ref 26.0–34.0)
MCHC: 32.1 g/dL (ref 30.0–36.0)
MCV: 95.5 fL (ref 80.0–100.0)
Platelets: 341 10*3/uL (ref 150–400)
RBC: 2.87 MIL/uL — ABNORMAL LOW (ref 3.87–5.11)
RDW: 15 % (ref 11.5–15.5)
WBC: 9.3 10*3/uL (ref 4.0–10.5)
nRBC: 0 % (ref 0.0–0.2)

## 2021-03-11 LAB — HIV ANTIBODY (ROUTINE TESTING W REFLEX): HIV Screen 4th Generation wRfx: NONREACTIVE

## 2021-03-11 LAB — POTASSIUM: Potassium: 2.9 mmol/L — ABNORMAL LOW (ref 3.5–5.1)

## 2021-03-11 MED ORDER — POTASSIUM CHLORIDE CRYS ER 20 MEQ PO TBCR: 20.0000 meq | EXTENDED_RELEASE_TABLET | Freq: Two times a day (BID) | ORAL | 1 refills | Status: AC

## 2021-03-11 MED ORDER — POTASSIUM CHLORIDE CRYS ER 20 MEQ PO TBCR
40.0000 meq | EXTENDED_RELEASE_TABLET | Freq: Once | ORAL | Status: AC
  Administered 2021-03-11: 40 meq via ORAL
  Filled 2021-03-11: qty 2

## 2021-03-11 MED ORDER — FLUTICASONE FUROATE-VILANTEROL 200-25 MCG/ACT IN AEPB
1.0000 | INHALATION_SPRAY | Freq: Every day | RESPIRATORY_TRACT | Status: DC
Start: 2021-03-11 — End: 2021-03-11
  Administered 2021-03-11: 1 via RESPIRATORY_TRACT
  Filled 2021-03-11: qty 28

## 2021-03-11 MED ORDER — POTASSIUM CHLORIDE 10 MEQ/100ML IV SOLN
10.0000 meq | INTRAVENOUS | Status: AC
  Administered 2021-03-11 (×4): 10 meq via INTRAVENOUS
  Filled 2021-03-11: qty 100

## 2021-03-11 MED ORDER — SODIUM CHLORIDE 0.9 % IV SOLN: INTRAVENOUS | Status: DC | PRN

## 2021-03-11 MED ORDER — NICOTINE 21 MG/24HR TD PT24
21.0000 mg | MEDICATED_PATCH | Freq: Once | TRANSDERMAL | Status: DC
  Administered 2021-03-11: 21 mg via TRANSDERMAL
  Filled 2021-03-11: qty 1

## 2021-03-11 NOTE — ED Notes (Signed)
Pt assisted to bedside commode

## 2021-03-11 NOTE — ED Notes (Signed)
Pt requesting nicotine patch- secure chat sent to Dr Maryland Pink for order

## 2021-03-11 NOTE — ED Notes (Signed)
Pt up to use bathroom 

## 2021-03-11 NOTE — Discharge Summary (Signed)
Discharge Summary  Katherine Perez QMV:[id number] DOB: [birth year]  PCP: Katherine Lighter, MD  Admit date: 03/10/2021 Discharge date: 03/11/2021  Time spent: 35 minutes  Recommendations for Outpatient Follow-up:  Patient given refill on her potassium 20 mEq p.o. twice daily Patient will follow up with her nephrologist, Katherine Perez in the next few weeks.  Discharge Diagnoses:  Active Hospital Problems   Diagnosis Date Noted   Hypokalemia 04/23/2020   CKD (chronic kidney disease), stage IV ([place]) 06/27/2020   Hyponatremia 10/26/2019    Resolved Hospital Problems  No resolved problems to display.    Discharge Condition: Improved, being discharged home  Diet recommendation: Renal diet  Vitals:   03/11/21 1345 03/11/21 1415  BP:    Pulse: 80 86  Resp: 13 19  Temp:    SpO2: 98% 96%    History of present illness:  50 year old female with past medical history of stage IV chronic kidney disease with chronic hypokalemia and hyponatremia who had been reporting generalized weakness for several weeks.  Her nephrologist had been putting her on oral potassium and on 11/18, saw her in the office and a potassium was 2.4.  He sent her over to the emergency room for further evaluation.  Patient given IV potassium and brought in for observation under the hospitalist service.  Hospital Course:  Principal Problem:   Hypokalemia: Magnesium checked and found to be normal.  Supplemental potassium given.  This morning by 11/18 night, had dropped to as low as 2 but by this morning, up to 2.8.  Patient given since then 60 mEq of p.o. potassium.  Repeat potassium level right before discharge still at 2.9 although level was checked within a short amount of timeframe from last oral dose of K. Dur, 40 meq. Ordered 1 additional dose of K. Dur and patient discharged home. Active Problems:   Hyponatremia: Improved.  127 in the emergency room and at 132 by morning of 11/19.    CKD (chronic kidney disease),  stage IV ([place]): Stable.  Closer to stage IIIb.  Creatinine at 1.91.  Underweight: Patient's BMI at 18.46.  Chronic constipation: Patient takes laxatives frequently to keep herself regular.  Am concerned that there may be some abuse potential which is leading to hypokalemia.  Procedures: None  Consultations: None  Discharge Exam: BP 98/66   Pulse 86   Temp 98.4 F (36.9 C) (Oral)   Resp 19   Ht 5\' 9"  (1.753 m)   Wt 56.7 kg   SpO2 96%   BMI 18.46 kg/m   General: Alert and oriented x3, no acute distress Cardiovascular: Regular rate and rhythm, S1-S2 Respiratory: Clear to auscultation bilaterally  Discharge Instructions You were cared for by a hospitalist during your hospital stay. If you have any questions about your discharge medications or the care you received while you were in the hospital after you are discharged, you can call the unit and asked to speak with the hospitalist on call if the hospitalist that took care of you is not available. Once you are discharged, your primary care physician will handle any further medical issues. Please note that NO REFILLS for any discharge medications will be authorized once you are discharged, as it is imperative that you return to your primary care physician (or establish a relationship with a primary care physician if you do not have one) for your aftercare needs so that they can reassess your need for medications and monitor your lab values.   Allergies as of 03/11/2021  Reactions   Cetirizine Hives   Diazepam Other (See Comments)   Depression   Baclofen Anxiety, Other (See Comments)   AMS - "Really messed me up, caused me to drop things"        Medication List     TAKE these medications    albuterol 108 (90 Base) MCG/ACT inhaler Commonly known as: VENTOLIN HFA Inhale 2 puffs into the lungs every 4 (four) hours as needed for shortness of breath or wheezing.   amitriptyline 25 MG tablet Commonly known as:  ELAVIL Take 50 mg by mouth at bedtime.   Brimonidine Tartrate 0.025 % Soln Place 1 drop into both eyes daily.   doxepin 50 MG capsule Commonly known as: SINEQUAN Take 50 mg by mouth at bedtime.   Dulcolax 5 MG EC tablet Generic drug: bisacodyl Take 2 tablets (10 mg total) by mouth at bedtime as needed for moderate constipation.   DULoxetine 20 MG capsule Commonly known as: CYMBALTA Take 40 mg by mouth in the morning.   DUPIXENT [place] Inject 300 mg into the skin every 14 (fourteen) days. (Fridays)   famotidine 40 MG tablet Commonly known as: PEPCID Take 40 mg by mouth at bedtime.   fexofenadine 180 MG tablet Commonly known as: ALLEGRA Take 180 mg by mouth daily as needed for allergies.   FIBER PO Take 1 capsule by mouth daily.   fluocinonide cream 0.05 % Commonly known as: LIDEX Apply 1 application topically 2 (two) times daily as needed (skin irritations).   fluticasone 50 MCG/ACT nasal spray Commonly known as: FLONASE Place 1-2 sprays into both nostrils 2 (two) times daily as needed for allergies or rhinitis.   Fluticasone-Salmeterol 250-50 MCG/DOSE Aepb Commonly known as: ADVAIR Inhale 1 puff into the lungs 2 (two) times daily.   gabapentin 300 MG capsule Commonly known as: NEURONTIN Take 1 capsule (300 mg total) by mouth 2 (two) times daily.   leflunomide 20 MG tablet Commonly known as: ARAVA Take 20 mg by mouth daily.   medroxyPROGESTERone 150 MG/ML injection Commonly known as: DEPO-PROVERA Inject 150 mg into the muscle every 3 (three) months.   melatonin 3 MG Tabs tablet Take 2 tablets (6 mg total) by mouth at bedtime.   metoCLOPramide 10 MG tablet Commonly known as: REGLAN Take 10 mg by mouth in the morning and at bedtime.   oxybutynin 10 MG 24 hr tablet Commonly known as: DITROPAN-XL Take 10 mg by mouth daily in the afternoon.   pentosan polysulfate 100 MG capsule Commonly known as: ELMIRON Take 100 mg by mouth at bedtime.   Plecanatide 3 MG  Tabs Take 3 mg by mouth daily at 6 (six) AM.   potassium chloride SA 20 MEQ tablet Commonly known as: KLOR-CON Take 1 tablet (20 mEq total) by mouth 2 (two) times daily.   promethazine 25 MG tablet Commonly known as: PHENERGAN Take 25 mg by mouth every 8 (eight) hours as needed for nausea or vomiting.   RABEprazole 20 MG tablet Commonly known as: ACIPHEX Take 20 mg by mouth in the morning and at bedtime.   sodium citrate-citric acid 500-334 MG/5ML solution Commonly known as: ORACIT Take 15 mLs by mouth in the morning and at bedtime.   tamsulosin 0.4 MG Caps capsule Commonly known as: FLOMAX Take 0.4 mg by mouth at bedtime.   theophylline 400 MG 24 hr tablet Commonly known as: UNIPHYL Take 400 mg by mouth 2 (two) times daily.   thiamine 50 MG tablet Commonly known as: VITAMIN B-1  Take 50 mg by mouth daily.   traMADol 50 MG tablet Commonly known as: ULTRAM Take 50 mg by mouth every 6 (six) hours as needed for moderate pain.   Uribel 118 MG Caps Take 1 capsule by mouth 3 (three) times daily as needed (Urinary symptoms).       Allergies  Allergen Reactions   Cetirizine Hives   Diazepam Other (See Comments)    Depression    Baclofen Anxiety and Other (See Comments)    AMS - "Really messed me up, caused me to drop things"       The results of significant diagnostics from this hospitalization (including imaging, microbiology, ancillary and laboratory) are listed below for reference.    Significant Diagnostic Studies: No results found.  Microbiology: Recent Results (from the past 240 hour(s))  Resp Panel by RT-PCR (Flu A&B, Covid) Nasopharyngeal Swab     Status: None   Collection Time: 03/10/21 11:13 AM   Specimen: Nasopharyngeal Swab; Nasopharyngeal(NP) swabs in vial transport medium  Result Value Ref Range Status   SARS Coronavirus 2 by RT PCR NEGATIVE NEGATIVE Final    Comment: (NOTE) SARS-CoV-2 target nucleic acids are NOT DETECTED.  The SARS-CoV-2 RNA  is generally detectable in upper respiratory specimens during the acute phase of infection. The lowest concentration of SARS-CoV-2 viral copies this assay can detect is 138 copies/mL. A negative result does not preclude SARS-Cov-2 infection and should not be used as the sole basis for treatment or other patient management decisions. A negative result may occur with  improper specimen collection/handling, submission of specimen other than nasopharyngeal swab, presence of viral mutation(s) within the areas targeted by this assay, and inadequate number of viral copies(<138 copies/mL). A negative result must be combined with clinical observations, patient history, and epidemiological information. The expected result is Negative.  Fact Sheet for Patients:  EntrepreneurPulse.com.au  Fact Sheet for Healthcare Providers:  IncredibleEmployment.be  This test is no t yet approved or cleared by the [place] FDA and  has been authorized for detection and/or diagnosis of SARS-CoV-2 by FDA under an Emergency Use Authorization (EUA). This EUA will remain  in effect (meaning this test can be used) for the duration of the COVID-19 declaration under Section 564(b)(1) of the Act, 21 U.S.C.section 360bbb-3(b)(1), unless the authorization is terminated  or revoked sooner.       Influenza A by PCR NEGATIVE NEGATIVE Final   Influenza B by PCR NEGATIVE NEGATIVE Final    Comment: (NOTE) The Xpert Xpress SARS-CoV-2/FLU/RSV plus assay is intended as an aid in the diagnosis of influenza from Nasopharyngeal swab specimens and should not be used as a sole basis for treatment. Nasal washings and aspirates are unacceptable for Xpert Xpress SARS-CoV-2/FLU/RSV testing.  Fact Sheet for Patients: EntrepreneurPulse.com.au  Fact Sheet for Healthcare Providers: IncredibleEmployment.be  This test is not yet approved or cleared by the  [place] FDA and has been authorized for detection and/or diagnosis of SARS-CoV-2 by FDA under an Emergency Use Authorization (EUA). This EUA will remain in effect (meaning this test can be used) for the duration of the COVID-19 declaration under Section 564(b)(1) of the Act, 21 U.S.C. section 360bbb-3(b)(1), unless the authorization is terminated or revoked.  Performed at [facility], [address] [place], [place] [address]      Labs: Basic Metabolic Panel: Recent Labs  Lab 03/10/21 1020 03/10/21 2230 03/11/21 0721  NA 130* 127* 132*  K 2.4* 2.0* 2.8*  CL 99 103 108  CO2 15*  16* 16*  GLUCOSE 107* 127* 82  BUN 54* 47* 44*  CREATININE 2.10* 1.91* 1.91*  CALCIUM 8.7* 7.8* 7.7*  MG 2.6*  --   --    Liver Function Tests: Recent Labs  Lab 03/10/21 1020  AST 23  ALT 20  ALKPHOS 142*  BILITOT 0.5  PROT 7.6  ALBUMIN 3.5   Recent Labs  Lab 03/10/21 1020  LIPASE 37   No results for input(s): AMMONIA in the last 168 hours. CBC: Recent Labs  Lab 03/10/21 1020 03/11/21 0721  WBC 12.1* 9.3  HGB 11.1* 8.8*  HCT 35.1* 27.4*  MCV 97.2 95.5  PLT 434* 341   Cardiac Enzymes: No results for input(s): CKTOTAL, CKMB, CKMBINDEX, TROPONINI in the last 168 hours. BNP: BNP (last 3 results) No results for input(s): BNP in the last 8760 hours.  ProBNP (last 3 results) No results for input(s): PROBNP in the last 8760 hours.  CBG: No results for input(s): GLUCAP in the last 168 hours.     Signed:  Annita Brod, MD [facility] 03/11/2021, 2:38 PM

## 2021-03-11 NOTE — ED Notes (Signed)
Potassium level dropped to 2.0, notified Randol Kern NP. Patient is due to receive 8 runs of iv potassium. Secondary iv was placed. Patient has complained of the iv potassium burning adjusted flow rate to 75cc/hr.

## 2021-03-11 NOTE — ED Notes (Signed)
Lab at bedside

## 2021-03-17 ENCOUNTER — Other Ambulatory Visit: Payer: Self-pay

## 2021-03-17 ENCOUNTER — Ambulatory Visit
Admission: RE | Admit: 2021-03-17 | Discharge: 2021-03-17 | Disposition: A | Discharge status: Home / Self Care | Payer: BC Managed Care – PPO | Source: Ambulatory Visit | Attending: Nephrology | Admitting: Nephrology

## 2021-03-17 DIAGNOSIS — E876 Hypokalemia: Secondary | ICD-10-CM | POA: Insufficient documentation

## 2021-03-17 MED ORDER — POTASSIUM CHLORIDE 2 MEQ/ML IV SOLN
Freq: Once | INTRAVENOUS | Status: AC
  Filled 2021-03-17 (×2): qty 1000

## 2021-03-17 MED ORDER — SODIUM CHLORIDE FLUSH 0.9 % IV SOLN
INTRAVENOUS | Status: AC
  Filled 2021-03-17: qty 10

## 2021-03-17 MED ORDER — POTASSIUM CHLORIDE 2 MEQ/ML IV SOLN
INTRAVENOUS | Status: DC
  Filled 2021-03-17 (×5): qty 1000

## 2021-03-21 ENCOUNTER — Other Ambulatory Visit: Payer: Self-pay

## 2021-03-21 ENCOUNTER — Ambulatory Visit
Admission: RE | Admit: 2021-03-21 | Discharge: 2021-03-21 | Disposition: A | Discharge status: Home / Self Care | Payer: BC Managed Care – PPO | Source: Ambulatory Visit | Attending: Nephrology | Admitting: Nephrology

## 2021-03-21 DIAGNOSIS — E876 Hypokalemia: Secondary | ICD-10-CM | POA: Diagnosis present

## 2021-03-21 LAB — RENAL FUNCTION PANEL
Albumin: 2.5 g/dL — ABNORMAL LOW (ref 3.5–5.0)
Anion gap: 5 (ref 5–15)
BUN: 37 mg/dL — ABNORMAL HIGH (ref 6–20)
CO2: 12 mmol/L — ABNORMAL LOW (ref 22–32)
Calcium: 8.9 mg/dL (ref 8.9–10.3)
Chloride: 113 mmol/L — ABNORMAL HIGH (ref 98–111)
Creatinine, Ser: 1.39 mg/dL — ABNORMAL HIGH (ref 0.44–1.00)
GFR, Estimated: 47 mL/min — ABNORMAL LOW (ref >60)
Glucose, Bld: 84 mg/dL (ref 70–99)
Phosphorus: 2.8 mg/dL (ref 2.5–4.6)
Potassium: 3.9 mmol/L (ref 3.5–5.1)
Sodium: 130 mmol/L — ABNORMAL LOW (ref 135–145)

## 2021-03-21 MED ORDER — POTASSIUM CHLORIDE 2 MEQ/ML IV SOLN
INTRAVENOUS | Status: AC
  Filled 2021-03-21: qty 1000

## 2021-05-05 ENCOUNTER — Encounter: Payer: Self-pay | Admitting: Oncology

## 2021-05-05 NOTE — Telephone Encounter (Signed)
error 

## 2021-05-13 ENCOUNTER — Emergency Department: Payer: BC Managed Care – PPO

## 2021-05-13 ENCOUNTER — Inpatient Hospital Stay
Admission: EM | Admit: 2021-05-13 | Discharge: 2021-05-16 | DRG: 682 | Disposition: A | Discharge status: Home / Self Care | Payer: BC Managed Care – PPO | Attending: Internal Medicine | Admitting: Internal Medicine

## 2021-05-13 ENCOUNTER — Other Ambulatory Visit: Payer: Self-pay

## 2021-05-13 DIAGNOSIS — R809 Proteinuria, unspecified: Secondary | ICD-10-CM | POA: Diagnosis present

## 2021-05-13 DIAGNOSIS — J45909 Unspecified asthma, uncomplicated: Secondary | ICD-10-CM | POA: Diagnosis present

## 2021-05-13 DIAGNOSIS — N179 Acute kidney failure, unspecified: Secondary | ICD-10-CM | POA: Diagnosis present

## 2021-05-13 DIAGNOSIS — M0579 Rheumatoid arthritis with rheumatoid factor of multiple sites without organ or systems involvement: Secondary | ICD-10-CM | POA: Diagnosis present

## 2021-05-13 DIAGNOSIS — Z20822 Contact with and (suspected) exposure to covid-19: Secondary | ICD-10-CM | POA: Diagnosis present

## 2021-05-13 DIAGNOSIS — G629 Polyneuropathy, unspecified: Secondary | ICD-10-CM | POA: Diagnosis present

## 2021-05-13 DIAGNOSIS — D631 Anemia in chronic kidney disease: Secondary | ICD-10-CM | POA: Diagnosis present

## 2021-05-13 DIAGNOSIS — F17210 Nicotine dependence, cigarettes, uncomplicated: Secondary | ICD-10-CM | POA: Diagnosis present

## 2021-05-13 DIAGNOSIS — K3184 Gastroparesis: Secondary | ICD-10-CM | POA: Diagnosis present

## 2021-05-13 DIAGNOSIS — Z681 Body mass index (BMI) 19 or less, adult: Secondary | ICD-10-CM | POA: Diagnosis not present

## 2021-05-13 DIAGNOSIS — R5381 Other malaise: Secondary | ICD-10-CM

## 2021-05-13 DIAGNOSIS — N184 Chronic kidney disease, stage 4 (severe): Secondary | ICD-10-CM | POA: Diagnosis present

## 2021-05-13 DIAGNOSIS — L89152 Pressure ulcer of sacral region, stage 2: Secondary | ICD-10-CM | POA: Diagnosis present

## 2021-05-13 DIAGNOSIS — G9341 Metabolic encephalopathy: Secondary | ICD-10-CM | POA: Diagnosis present

## 2021-05-13 DIAGNOSIS — R64 Cachexia: Secondary | ICD-10-CM | POA: Diagnosis present

## 2021-05-13 DIAGNOSIS — F552 Abuse of laxatives: Secondary | ICD-10-CM | POA: Diagnosis present

## 2021-05-13 DIAGNOSIS — Z79899 Other long term (current) drug therapy: Secondary | ICD-10-CM | POA: Diagnosis not present

## 2021-05-13 DIAGNOSIS — E872 Acidosis, unspecified: Secondary | ICD-10-CM | POA: Diagnosis present

## 2021-05-13 DIAGNOSIS — E860 Dehydration: Secondary | ICD-10-CM | POA: Diagnosis present

## 2021-05-13 DIAGNOSIS — E43 Unspecified severe protein-calorie malnutrition: Secondary | ICD-10-CM | POA: Diagnosis present

## 2021-05-13 DIAGNOSIS — K219 Gastro-esophageal reflux disease without esophagitis: Secondary | ICD-10-CM | POA: Diagnosis present

## 2021-05-13 DIAGNOSIS — I7300 Raynaud's syndrome without gangrene: Secondary | ICD-10-CM | POA: Diagnosis present

## 2021-05-13 DIAGNOSIS — N3010 Interstitial cystitis (chronic) without hematuria: Secondary | ICD-10-CM | POA: Diagnosis present

## 2021-05-13 DIAGNOSIS — Z888 Allergy status to other drugs, medicaments and biological substances status: Secondary | ICD-10-CM

## 2021-05-13 DIAGNOSIS — K5909 Other constipation: Secondary | ICD-10-CM | POA: Diagnosis present

## 2021-05-13 DIAGNOSIS — E876 Hypokalemia: Secondary | ICD-10-CM | POA: Diagnosis not present

## 2021-05-13 LAB — COMPREHENSIVE METABOLIC PANEL
ALT: 15 U/L (ref 0–44)
AST: 14 U/L — ABNORMAL LOW (ref 15–41)
Albumin: 2.9 g/dL — ABNORMAL LOW (ref 3.5–5.0)
Alkaline Phosphatase: 131 U/L — ABNORMAL HIGH (ref 38–126)
BUN: 68 mg/dL — ABNORMAL HIGH (ref 6–20)
CO2: <7 mmol/L — ABNORMAL LOW (ref 22–32)
Calcium: 8.8 mg/dL — ABNORMAL LOW (ref 8.9–10.3)
Chloride: 115 mmol/L — ABNORMAL HIGH (ref 98–111)
Creatinine, Ser: 2.45 mg/dL — ABNORMAL HIGH (ref 0.44–1.00)
GFR, Estimated: 24 mL/min — ABNORMAL LOW (ref >60)
Glucose, Bld: 73 mg/dL (ref 70–99)
Potassium: 3.8 mmol/L (ref 3.5–5.1)
Sodium: 138 mmol/L (ref 135–145)
Total Bilirubin: 0.5 mg/dL (ref 0.3–1.2)
Total Protein: 7 g/dL (ref 6.5–8.1)

## 2021-05-13 LAB — BLOOD GAS, VENOUS
Acid-base deficit: 22.8 mmol/L — ABNORMAL HIGH (ref 0.0–2.0)
Bicarbonate: 6.1 mmol/L — ABNORMAL LOW (ref 20.0–28.0)
O2 Saturation: 50.5 %
Patient temperature: 37
pCO2, Ven: 22 mmHg — ABNORMAL LOW (ref 44.0–60.0)
pH, Ven: 7.05 — CL (ref 7.250–7.430)
pO2, Ven: 41 mmHg (ref 32.0–45.0)

## 2021-05-13 LAB — CBC
HCT: 34.1 % — ABNORMAL LOW (ref 36.0–46.0)
Hemoglobin: 10.2 g/dL — ABNORMAL LOW (ref 12.0–15.0)
MCH: 30.4 pg (ref 26.0–34.0)
MCHC: 29.9 g/dL — ABNORMAL LOW (ref 30.0–36.0)
MCV: 101.8 fL — ABNORMAL HIGH (ref 80.0–100.0)
Platelets: 425 10*3/uL — ABNORMAL HIGH (ref 150–400)
RBC: 3.35 MIL/uL — ABNORMAL LOW (ref 3.87–5.11)
RDW: 16.1 % — ABNORMAL HIGH (ref 11.5–15.5)
WBC: 14.6 10*3/uL — ABNORMAL HIGH (ref 4.0–10.5)
nRBC: 0 % (ref 0.0–0.2)

## 2021-05-13 LAB — LIPASE, BLOOD: Lipase: 39 U/L (ref 11–51)

## 2021-05-13 LAB — PHOSPHORUS: Phosphorus: 7 mg/dL — ABNORMAL HIGH (ref 2.5–4.6)

## 2021-05-13 LAB — AMMONIA: Ammonia: 15 umol/L (ref 9–35)

## 2021-05-13 LAB — MAGNESIUM: Magnesium: 2 mg/dL (ref 1.7–2.4)

## 2021-05-13 LAB — RESP PANEL BY RT-PCR (FLU A&B, COVID) ARPGX2
Influenza A by PCR: NEGATIVE
Influenza B by PCR: NEGATIVE
SARS Coronavirus 2 by RT PCR: NEGATIVE

## 2021-05-13 LAB — LACTIC ACID, PLASMA: Lactic Acid, Venous: 0.9 mmol/L (ref 0.5–1.9)

## 2021-05-13 MED ORDER — SODIUM BICARBONATE 8.4 % IV SOLN
INTRAVENOUS | Status: AC
  Filled 2021-05-13: qty 1000
  Filled 2021-05-13: qty 150

## 2021-05-13 MED ORDER — ACETAMINOPHEN 650 MG RE SUPP: 650.0000 mg | Freq: Four times a day (QID) | RECTAL | Status: DC | PRN

## 2021-05-13 MED ORDER — FIBER PO POWD: Freq: Every day | ORAL | Status: DC

## 2021-05-13 MED ORDER — PROCHLORPERAZINE EDISYLATE 10 MG/2ML IJ SOLN
5.0000 mg | INTRAMUSCULAR | Status: DC | PRN
  Filled 2021-05-13: qty 1

## 2021-05-13 MED ORDER — ACETAMINOPHEN 325 MG PO TABS: 650.0000 mg | ORAL_TABLET | Freq: Four times a day (QID) | ORAL | Status: DC | PRN

## 2021-05-13 MED ORDER — ALBUTEROL SULFATE (2.5 MG/3ML) 0.083% IN NEBU: 3.0000 mL | INHALATION_SOLUTION | RESPIRATORY_TRACT | Status: DC | PRN

## 2021-05-13 MED ORDER — ENOXAPARIN SODIUM 30 MG/0.3ML IJ SOSY
30.0000 mg | PREFILLED_SYRINGE | INTRAMUSCULAR | Status: DC
  Administered 2021-05-14 – 2021-05-15 (×2): 30 mg via SUBCUTANEOUS
  Filled 2021-05-13 (×2): qty 0.3

## 2021-05-13 MED ORDER — LACTATED RINGERS IV BOLUS
500.0000 mL | Freq: Once | INTRAVENOUS | Status: AC
  Administered 2021-05-13: 500 mL via INTRAVENOUS

## 2021-05-13 MED ORDER — BISACODYL 5 MG PO TBEC: 10.0000 mg | DELAYED_RELEASE_TABLET | Freq: Every evening | ORAL | Status: DC | PRN

## 2021-05-13 NOTE — ED Notes (Signed)
Pt sitting up in bed & awake at this time.

## 2021-05-13 NOTE — ED Provider Triage Note (Signed)
Emergency Medicine Provider Triage Evaluation Note  Katherine Perez , a 51 y.o. female  was evaluated in triage.  Pt complains of altered mental status.  Husband reports this started 2 days ago.  He has noticed labored breathing and chills.  He has not noticed any fever or URI symptoms.  She has a history of metabolic encephalopathy, acute pancreatitis, bile leak.  Review of Systems  Positive: Altered mental status, labored breathing, chills Negative: Headache, dizziness, chest pain, shortness of breath, abdominal pain  Physical Exam  BP 129/81    Pulse 87    Temp 98.1 F (36.7 C) (Oral)    Resp 17    SpO2 96%  Gen:   Awake, oriented to person and place.  Seems agitated by asking questions. ENT:  She does not follow commands appropriately to check EOMs. Resp:  Normal effort  Cardio:  RRR MSK:   Not responding commands appropriately to check strength Neuro:  No facial droop, no tongue deviation  Medical Decision Making  Medically screening exam initiated at 2:08 PM.  Appropriate orders placed.  Gordy Clement [facility] Detore was informed that the remainder of the evaluation will be completed by another provider, this initial triage assessment does not replace that evaluation, and the importance of remaining in the ED until their evaluation is complete.  AMS:  CBC, c-Met, ammonia CBG   Jearld Fenton, NP 05/13/21 1418

## 2021-05-13 NOTE — ED Notes (Signed)
Attempted blood draw w/out success, pt hard stick. Lab called for blood draw.

## 2021-05-13 NOTE — ED Notes (Signed)
Pt is known difficult IV stick. This RN attempted x1 without success.

## 2021-05-13 NOTE — ED Triage Notes (Signed)
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 Pt is alert, oriented to self, situation, and place, does not know date.

## 2021-05-13 NOTE — ED Provider Notes (Signed)
[facility] Provider Note    Event Date/Time   First MD Initiated Contact with Patient 05/13/21 1847     (approximate)   History   Altered Mental Status   HPI  Katherine Perez is a 51 y.o. female   with extensive past medical history of chronic kidney disease, chronic malnutrition related to prior history of anorexia and reported: Dysmotility, here with generalized weakness and confusion.  The patient had what is described as a cold with mild cough and congestion about 10 days ago.  Since then, this is largely improved but she has become increasingly weak, nauseous, and has not been eating and drinking.  She said some mild shortness of breath for the last 24 hours.  Husband also notes that she has been more confused, forgetting what she is saying, and asking odd questions.  No falls.  Denies any headache.  She states she just feels weak and is unable to provide further history.  Level 5 caveat invoked as remainder of history, ROS, and physical exam limited due to patient's confusion.       Physical Exam   Triage Vital Signs: ED Triage Vitals [05/13/21 1407]  Enc Vitals Group     BP 129/81     Pulse Rate 87     Resp 17     Temp 98.1 F (36.7 C)     Temp Source Oral     SpO2 96 %     Weight      Height      Head Circumference      Peak Flow      Pain Score 4     Pain Loc      Pain Edu?      Excl. in [place]?     Most recent vital signs: Vitals:   05/13/21 2200 05/13/21 2230  BP: (!) 144/103 (!) 155/78  Pulse: 94 81  Resp:    Temp:    SpO2: 98% 99%     General: Awake, no distress.  Cachectic. CV:  Regular rate, 2+ pulses bilaterally.  No murmurs. Resp:  Lungs clear to auscultation bilaterally but with moderate tachypnea and apparent shortness of breath. Abd:  No distention.  No tenderness.  No rebound or guarding. Other:  Markedly dry mucous membranes with poor skin turgor and skin tenting.   ED Results / Procedures /  Treatments   Labs (all labs ordered are listed, but only abnormal results are displayed) Labs Reviewed  COMPREHENSIVE METABOLIC PANEL - Abnormal; Notable for the following components:      Result Value   Chloride 115 (*)    CO2 <7 (*)    BUN 68 (*)    Creatinine, Ser 2.45 (*)    Calcium 8.8 (*)    Albumin 2.9 (*)    AST 14 (*)    Alkaline Phosphatase 131 (*)    GFR, Estimated 24 (*)    All other components within normal limits  CBC - Abnormal; Notable for the following components:   WBC 14.6 (*)    RBC 3.35 (*)    Hemoglobin 10.2 (*)    HCT 34.1 (*)    MCV 101.8 (*)    MCHC 29.9 (*)    RDW 16.1 (*)    Platelets 425 (*)    All other components within normal limits  PHOSPHORUS - Abnormal; Notable for the following components:   Phosphorus 7.0 (*)    All other components within normal limits  BLOOD GAS, VENOUS - Abnormal; Notable for the following components:   pH, Ven 7.05 (*)    pCO2, Ven 22 (*)    Bicarbonate 6.1 (*)    Acid-base deficit 22.8 (*)    All other components within normal limits  RESP PANEL BY RT-PCR (FLU A&B, COVID) ARPGX2  CULTURE, BLOOD (ROUTINE X 2)  CULTURE, BLOOD (ROUTINE X 2)  AMMONIA  LACTIC ACID, PLASMA  MAGNESIUM  LIPASE, BLOOD  LACTIC ACID, PLASMA  URINALYSIS, ROUTINE W REFLEX MICROSCOPIC  BASIC METABOLIC PANEL  BASIC METABOLIC PANEL  CBC  CBG MONITORING, ED     RADIOLOGY CXR: Clear   I also independently reviewed and agree wit radiologist interpretations.   PROCEDURES:  Critical Care performed: Yes, see critical care procedure note(s)  .Critical Care Performed by: Duffy Bruce, MD Authorized by: Duffy Bruce, MD   Critical care provider statement:    Critical care time (minutes):  30   Critical care time was exclusive of:  Separately billable procedures and treating other patients   Critical care was necessary to treat or prevent imminent or life-threatening deterioration of the following conditions:  Cardiac failure,  circulatory failure, respiratory failure and metabolic crisis   Critical care was time spent personally by me on the following activities:  Development of treatment plan with patient or surrogate, discussions with consultants, evaluation of patient's response to treatment, examination of patient, ordering and review of laboratory studies, ordering and review of radiographic studies, ordering and performing treatments and interventions, pulse oximetry, re-evaluation of patient's condition and review of old charts    MEDICATIONS ORDERED IN ED: Medications  sodium bicarbonate 150 mEq in dextrose 5 % 1,150 mL infusion ( Intravenous New Bag/Given 05/13/21 2204)  albuterol (VENTOLIN HFA) 108 (90 Base) MCG/ACT inhaler 2 puff (has no administration in time range)  bisacodyl (DULCOLAX) EC tablet 10 mg (has no administration in time range)  Fiber POWD (has no administration in time range)  enoxaparin (LOVENOX) injection 30 mg (has no administration in time range)  acetaminophen (TYLENOL) tablet 650 mg (has no administration in time range)    Or  acetaminophen (TYLENOL) suppository 650 mg (has no administration in time range)  prochlorperazine (COMPAZINE) injection 5 mg (has no administration in time range)  lactated ringers bolus 500 mL (0 mLs Intravenous Stopped 05/13/21 2034)     IMPRESSION / MDM / [place] / ED COURSE  I reviewed the triage vital signs and the nursing notes.                               The patient is on the cardiac monitor to evaluate for evidence of arrhythmia and/or significant heart rate changes.   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  On exam, she is confused but has no focal neurological deficits.  She appears profoundly dehydrated.  Lab work is consistent with this with  significant AKI with BUN 68, creatinine 2.45.  Patient also has significant nonanion gap metabolic acidosis with bicarb less than 7, which I suspect is due to her profound volume depletion and worsening of her acute on chronic kidney injury.  CBC shows leukocytosis, though I suspect some of this is due to hemoconcentration.  Blood gas shows pH of 8.26 for metabolic acidosis.  Lactic acid is normal which is reassuring.  Patient is confused but has no focal neurological deficits.  I reviewed her previous records including CKD visits with Dr. Holley Raring in December 2022 and telephone visit in January for worsening gastroparesis.  Suspect this is contributing to her volume depletion and dehydration.  Case discussed with Dr. Candiss Norse of nephrology, he recommends D5 with isotonic bicarb and will see the patient as an inpatient.  Plan to admit to medicine.  Patient is afebrile, no signs of infection at this time but still awaiting urinalysis.  Case discussed with hospitalist Dr. Damita Dunnings who will admit.   MEDICATIONS GIVEN IN ED: Medications  sodium bicarbonate 150 mEq in dextrose 5 % 1,150 mL infusion ( Intravenous New Bag/Given 05/13/21 2204)  albuterol (VENTOLIN HFA) 108 (90 Base) MCG/ACT inhaler 2 puff (has no administration in time range)  bisacodyl (DULCOLAX) EC tablet 10 mg (has no administration in time range)  Fiber POWD (has no administration in time range)  enoxaparin (LOVENOX) injection 30 mg (has no administration in time range)  acetaminophen (TYLENOL) tablet 650 mg (has no administration in time range)    Or  acetaminophen (TYLENOL) suppository 650 mg (has no administration in time range)  prochlorperazine (COMPAZINE) injection 5 mg (has no administration in time range)  lactated ringers bolus 500 mL (0 mLs Intravenous Stopped 05/13/21 2034)     Consults:  Dr. Damita Dunnings with hosiptalist, case discussed and will admit Dr. Candiss Norse with Nephrology, discussed over telephone   EMR reviewed  Office visit  with Dr. Tressia Miners 04/2021  Nephrology visit 03/2021 with Dr. Holley Raring Prior admission notes for met acidosis      FINAL CLINICAL IMPRESSION(S) / ED DIAGNOSES   Final diagnoses:  AKI (acute kidney injury) ([place])  Severe dehydration  Metabolic acidosis  Metabolic encephalopathy     Rx / DC Orders   ED Discharge Orders     None        Note:  This document was prepared using Dragon voice recognition software and may include unintentional dictation errors.   Duffy Bruce, MD 05/13/21 2340

## 2021-05-13 NOTE — ED Notes (Signed)
COVID swab sent to lab.

## 2021-05-13 NOTE — ED Notes (Signed)
This RN called lab to draw additional blood work that was ordered; they are sending someone.

## 2021-05-13 NOTE — ED Notes (Signed)
IV team at BS 

## 2021-05-13 NOTE — H&P (Signed)
History and Physical    Katherine Perez JAS:[id number] DOB: [birth year] DOA: 05/13/2021  PCP: Gladstone Lighter, MD   Patient coming from: home  I have personally briefly reviewed patient's relevant medical records in [place]  Chief Complaint: weakness, altered mental status  HPI: Katherine Perez is a 51 y.o. female with medical history significant for Eating disorder, chronic constipation of multifactorial etiology on multiple prescription laxatives, primary gastroparesis, rheumatoid arthritis, CKD4 , anxiety, neuropathy, history of biliary stenting, brought into the ED with lethargy, forgetfulness, altered mental status, similar to prior hospitalizations for electrolyte derangement and metabolic acidosis.  She had a recent cough and congestion that has since improved.  She has had no fever or chills.  She has nausea with decreased oral intake but no vomiting.  Has had no abdominal pain or diarrhea.  History limited due to patient's lethargy  ED course: On arrival, vitals within normal limits and mild tachypnea to 21 Labs: WBC 14,000 with lactic acid 0.9 Hemoglobin 10.2 Creatinine 2.45, up from baseline of 1.39 with bicarb less than 7, anion gap unable to calculate Sodium and potassium within normal limits as well as magnesium.  Lipase and LFTs unremarkable Venous pH 7.05 with PCO2 22 Ammonia 15 COVID and influenza PCR negative  EKG: Not done from the ED  Imaging: Chest x-ray clear   The ED provider spoke with nephrologist, Dr. Candiss Norse who recommended bicarb infusion  Hospitalist consulted for admission.   Review of Systems: Unable to obtain due to lethargy  Assessment/Plan    Acute metabolic encephalopathy - Secondary to severe metabolic acidosis - Treat acute problem - Neurologic checks, fall and aspiration precautions - N.p.o. until more awake    Metabolic acidosis, anion gap incalculable - Suspect secondary to laxative abuse with  possible chronic overflow diarrhea - Bicarb less than 7 - Renal consulted from the ED recommending bicarb infusion - Continue D5 with 150 mEq bicarb at 100 mils per hour as recommended by nephrology - Serial BMP    Acute renal failure superimposed on stage 4 chronic kidney disease (HCC) - Likely prerenal related to dehydration and suspect fluid losses from possible overflow - IV hydration - Monitor renal function and avoid nephrotoxins    Neuropathy - Hold amitriptyline, Cymbalta, Neurontin for tonight chronic  Chronic constipation, multifactorial Laxative habit - Patient on multiple prescription laxatives, followed by GI - Consider GI consult  Primary gastroparesis - Continue Reglan and PPI    Chronic interstitial cystitis - Continue oxybutynin, Flomax    Rheumatoid arthritis involving multiple sites with positive rheumatoid factor ([place]) - Continue Arava, Dupixent   DVT prophylaxis: Lovenox  Code Status: full code  Family Communication:  none  Disposition Plan: Back to previous home environment Consults called: none Status:At the time of admission, it appears that the appropriate admission status for this patient is INPATIENT. This is judged to be reasonable and necessary in order to provide the required intensity of service to ensure the patient's safety given the presenting symptoms, physical exam findings, and initial radiographic and laboratory data in the context of their  Comorbid conditions.   Patient requires inpatient status due to high intensity of service, high risk for further deterioration and high frequency of surveillance required.   I certify that at the point of admission it is my clinical judgment that the patient will require inpatient hospital care spanning beyond 2 midnights     Physical Exam: Vitals:   05/13/21 1407 05/13/21 1819 05/13/21 2030  BP: 129/81 131/77 (!) 147/69  Pulse: 87 90 91  Resp: 17 20 (!) 21  Temp: 98.1 F (36.7 C) 97.8 F (36.6  C)   TempSrc: Oral Oral   SpO2: 96% 98% 100%   Constitutional: Cachectic appearance, lethargic. Not in any apparent distress HEENT:      Head: Normocephalic and atraumatic.         Eyes: PERLA, EOMI, Conjunctivae are normal. Sclera is non-icteric.       Mouth/Throat: Mucous membranes are moist.       Neck: Supple with no signs of meningismus. Cardiovascular: Regular rate and rhythm. No murmurs, gallops, or rubs. 2+ symmetrical distal pulses are present . No JVD. No  LE edema Respiratory: Respiratory effort normal .Lungs sounds clear bilaterally. No wheezes, crackles, or rhonchi.  Gastrointestinal: Soft, non tender, non distended. Positive bowel sounds.  Genitourinary: No CVA tenderness. Musculoskeletal: Nontender with normal range of motion in all extremities. No cyanosis, or erythema of extremities. Neurologic:  Face is symmetric. Moving all extremities. No gross focal neurologic deficits . Skin: Skin is warm, dry.  No rash or ulcers Psychiatric: Difficult to assess due to lethargy    Past Medical History:  Diagnosis Date   Anemia    Asthma    CKD (chronic kidney disease)    ETOH abuse    GERD (gastroesophageal reflux disease)    IC (interstitial cystitis)    IDA (iron deficiency anemia) 10/07/2020   Laxative abuse    Neuropathy    Pneumonia    Raynaud disease     Past Surgical History:  Procedure Laterality Date   COLONOSCOPY     ERCP N/A 07/01/2020   Procedure: ENDOSCOPIC RETROGRADE CHOLANGIOPANCREATOGRAPHY (ERCP);  Surgeon: Lucilla Lame, MD;  [facility] ENDOSCOPY;  Service: Endoscopy;  Laterality: N/A;   ERCP N/A 10/18/2020   Procedure: ENDOSCOPIC RETROGRADE CHOLANGIOPANCREATOGRAPHY (ERCP) STENT REMOVAL;  Surgeon: Lucilla Lame, MD;  Location: [facility] ENDOSCOPY;  Service: Endoscopy;  Laterality: N/A;   ESOPHAGOGASTRODUODENOSCOPY     FRACTURE SURGERY     femur fracture left - 2019   LEG SURGERY       reports that she has been smoking cigarettes. She has been smoking  an average of 1 pack per day. She has never used smokeless tobacco. She reports current alcohol use. She reports current drug use. Drug: Marijuana.  Allergies  Allergen Reactions   Cetirizine Hives   Diazepam Other (See Comments)    Depression    Baclofen Anxiety and Other (See Comments)    AMS - "Really messed me up, caused me to drop things"     Family History  Problem Relation Age of Onset   Other Neg Hx       Prior to Admission medications   Medication Sig Start Date End Date Taking? Authorizing Provider  albuterol (VENTOLIN HFA) 108 (90 Base) MCG/ACT inhaler Inhale 2 puffs into the lungs every 4 (four) hours as needed for shortness of breath or wheezing. 06/07/19   [provider]  amitriptyline (ELAVIL) 25 MG tablet Take 50 mg by mouth at bedtime.    [provider]  bisacodyl (DULCOLAX) 5 MG EC tablet Take 2 tablets (10 mg total) by mouth at bedtime as needed for moderate constipation. 07/06/20 07/06/21  Val Riles, MD  Brimonidine Tartrate 0.025 % SOLN Place 1 drop into both eyes daily.    [provider]  doxepin (SINEQUAN) 50 MG capsule Take 50 mg by mouth at bedtime.    [provider]  DULoxetine (CYMBALTA) 20 MG capsule Take 40 mg by mouth in the morning.    [provider]  Dupilumab (DUPIXENT [place]) Inject 300 mg into the skin every 14 (fourteen) days. (Fridays)    [provider]  famotidine (PEPCID) 40 MG tablet Take 40 mg by mouth at bedtime. 10/07/19   [provider]  fexofenadine (ALLEGRA) 180 MG tablet Take 180 mg by mouth daily as needed for allergies.    [provider]  FIBER PO Take 1 capsule by mouth daily.    [provider]  fluocinonide cream (LIDEX) 6.29 % Apply 1 application topically 2 (two) times daily as needed (skin irritations).    [provider]  fluticasone (FLONASE) 50 MCG/ACT nasal spray Place 1-2 sprays into both nostrils 2 (two) times daily as needed for  allergies or rhinitis.    [provider]  Fluticasone-Salmeterol (ADVAIR) 250-50 MCG/DOSE AEPB Inhale 1 puff into the lungs 2 (two) times daily. 09/14/19   [provider]  gabapentin (NEURONTIN) 300 MG capsule Take 1 capsule (300 mg total) by mouth 2 (two) times daily. 08/12/20   Sharen Hones, MD  leflunomide (ARAVA) 20 MG tablet Take 20 mg by mouth daily.    [provider]  medroxyPROGESTERone (DEPO-PROVERA) 150 MG/ML injection Inject 150 mg into the muscle every 3 (three) months.    [provider]  melatonin 3 MG TABS tablet Take 2 tablets (6 mg total) by mouth at bedtime. 07/06/20   Val Riles, MD  Meth-Hyo-M Barnett Hatter Phos-Ph Sal (URIBEL) 118 MG CAPS Take 1 capsule by mouth 3 (three) times daily as needed (Urinary symptoms).    [provider]  metoCLOPramide (REGLAN) 10 MG tablet Take 10 mg by mouth in the morning and at bedtime. 10/17/19   [provider]  oxybutynin (DITROPAN-XL) 10 MG 24 hr tablet Take 10 mg by mouth daily in the afternoon.    [provider]  pentosan polysulfate (ELMIRON) 100 MG capsule Take 100 mg by mouth at bedtime.    [provider]  Plecanatide 3 MG TABS Take 3 mg by mouth daily at 6 (six) AM.     [provider]  potassium chloride SA (KLOR-CON) 20 MEQ tablet Take 1 tablet (20 mEq total) by mouth 2 (two) times daily. 03/11/21   Annita Brod, MD  promethazine (PHENERGAN) 25 MG tablet Take 25 mg by mouth every 8 (eight) hours as needed for nausea or vomiting.  10/23/19   [provider]  RABEprazole (ACIPHEX) 20 MG tablet Take 20 mg by mouth in the morning and at bedtime.    [provider]  sodium citrate-citric acid (ORACIT) 500-334 MG/5ML solution Take 15 mLs by mouth in the morning and at bedtime. 05/30/20   [provider]  tamsulosin (FLOMAX) 0.4 MG CAPS capsule Take 0.4 mg by mouth at bedtime. 10/10/19   [provider]  theophylline (UNIPHYL) 400  MG 24 hr tablet Take 400 mg by mouth 2 (two) times daily.  10/10/19   [provider]  thiamine (VITAMIN B-1) 50 MG tablet Take 50 mg by mouth daily.    [provider]  traMADol (ULTRAM) 50 MG tablet Take 50 mg by mouth every 6 (six) hours as needed for moderate pain.    [provider]      Labs on Admission: I have personally reviewed following labs and imaging studies  CBC: Recent Labs  Lab 05/13/21 1437  WBC 14.6*  HGB 10.2*  HCT  34.1*  MCV 101.8*  PLT 409*   Basic Metabolic Panel: Recent Labs  Lab 05/13/21 1437 05/13/21 1952  NA 138  --   K 3.8  --   CL 115*  --   CO2 <7*  --   GLUCOSE 73  --   BUN 68*  --   CREATININE 2.45*  --   CALCIUM 8.8*  --   MG  --  2.0  PHOS  --  7.0*   GFR: CrCl cannot be calculated (Unknown ideal weight.). Liver Function Tests: Recent Labs  Lab 05/13/21 1437  AST 14*  ALT 15  ALKPHOS 131*  BILITOT 0.5  PROT 7.0  ALBUMIN 2.9*   Recent Labs  Lab 05/13/21 1952  LIPASE 39   Recent Labs  Lab 05/13/21 1437  AMMONIA 15   Coagulation Profile: No results for input(s): INR, PROTIME in the last 168 hours. Cardiac Enzymes: No results for input(s): CKTOTAL, CKMB, CKMBINDEX, TROPONINI in the last 168 hours. BNP (last 3 results) No results for input(s): PROBNP in the last 8760 hours. HbA1C: No results for input(s): HGBA1C in the last 72 hours. CBG: No results for input(s): GLUCAP in the last 168 hours. Lipid Profile: No results for input(s): CHOL, HDL, LDLCALC, TRIG, CHOLHDL, LDLDIRECT in the last 72 hours. Thyroid Function Tests: No results for input(s): TSH, T4TOTAL, FREET4, T3FREE, THYROIDAB in the last 72 hours. Anemia Panel: No results for input(s): VITAMINB12, FOLATE, FERRITIN, TIBC, IRON, RETICCTPCT in the last 72 hours. Urine analysis:    Component Value Date/Time   COLORURINE YELLOW (A) 08/09/2020 1142   APPEARANCEUR HAZY (A) 08/09/2020 1142   LABSPEC 1.005 08/09/2020 1142   PHURINE  6.0 08/09/2020 1142   GLUCOSEU NEGATIVE 08/09/2020 1142   HGBUR MODERATE (A) 08/09/2020 1142   BILIRUBINUR NEGATIVE 08/09/2020 1142   KETONESUR NEGATIVE 08/09/2020 1142   PROTEINUR NEGATIVE 08/09/2020 1142   NITRITE NEGATIVE 08/09/2020 1142   LEUKOCYTESUR LARGE (A) 08/09/2020 1142    Radiological Exams on Admission: DG Chest 2 View  Result Date: 05/13/2021 CLINICAL DATA:  Altered mental status, weakness EXAM: CHEST - 2 VIEW COMPARISON:  08/08/2020 FINDINGS: Heart and mediastinal contours are within normal limits. No focal opacities or effusions. No acute bony abnormality. IMPRESSION: No active cardiopulmonary disease. Electronically Signed   By: Rolm Baptise M.D.   On: 05/13/2021 19:30       Athena Masse MD [facility]   05/13/2021, 9:43 PM

## 2021-05-14 ENCOUNTER — Encounter: Payer: Self-pay | Admitting: Internal Medicine

## 2021-05-14 DIAGNOSIS — G9341 Metabolic encephalopathy: Secondary | ICD-10-CM

## 2021-05-14 DIAGNOSIS — N184 Chronic kidney disease, stage 4 (severe): Secondary | ICD-10-CM

## 2021-05-14 DIAGNOSIS — N179 Acute kidney failure, unspecified: Principal | ICD-10-CM

## 2021-05-14 LAB — LACTIC ACID, PLASMA: Lactic Acid, Venous: 1.1 mmol/L (ref 0.5–1.9)

## 2021-05-14 LAB — CBC
HCT: 31.5 % — ABNORMAL LOW (ref 36.0–46.0)
Hemoglobin: 9.4 g/dL — ABNORMAL LOW (ref 12.0–15.0)
MCH: 30.1 pg (ref 26.0–34.0)
MCHC: 29.8 g/dL — ABNORMAL LOW (ref 30.0–36.0)
MCV: 101 fL — ABNORMAL HIGH (ref 80.0–100.0)
Platelets: 400 10*3/uL (ref 150–400)
RBC: 3.12 MIL/uL — ABNORMAL LOW (ref 3.87–5.11)
RDW: 16 % — ABNORMAL HIGH (ref 11.5–15.5)
WBC: 14.2 10*3/uL — ABNORMAL HIGH (ref 4.0–10.5)
nRBC: 0 % (ref 0.0–0.2)

## 2021-05-14 LAB — BASIC METABOLIC PANEL
Anion gap: 13 (ref 5–15)
BUN: 61 mg/dL — ABNORMAL HIGH (ref 6–20)
BUN: 67 mg/dL — ABNORMAL HIGH (ref 6–20)
CO2: 11 mmol/L — ABNORMAL LOW (ref 22–32)
CO2: <7 mmol/L — ABNORMAL LOW (ref 22–32)
Calcium: 8.4 mg/dL — ABNORMAL LOW (ref 8.9–10.3)
Calcium: 8.6 mg/dL — ABNORMAL LOW (ref 8.9–10.3)
Chloride: 118 mmol/L — ABNORMAL HIGH (ref 98–111)
Chloride: 119 mmol/L — ABNORMAL HIGH (ref 98–111)
Creatinine, Ser: 2.25 mg/dL — ABNORMAL HIGH (ref 0.44–1.00)
Creatinine, Ser: 2.36 mg/dL — ABNORMAL HIGH (ref 0.44–1.00)
GFR, Estimated: 25 mL/min — ABNORMAL LOW (ref >60)
GFR, Estimated: 26 mL/min — ABNORMAL LOW (ref >60)
Glucose, Bld: 90 mg/dL (ref 70–99)
Glucose, Bld: 96 mg/dL (ref 70–99)
Potassium: 2.9 mmol/L — ABNORMAL LOW (ref 3.5–5.1)
Potassium: 3.7 mmol/L (ref 3.5–5.1)
Sodium: 139 mmol/L (ref 135–145)
Sodium: 142 mmol/L (ref 135–145)

## 2021-05-14 MED ORDER — SODIUM BICARBONATE 8.4 % IV SOLN
INTRAVENOUS | Status: DC
  Filled 2021-05-14: qty 1000
  Filled 2021-05-14 (×3): qty 150
  Filled 2021-05-14 (×5): qty 1000

## 2021-05-14 MED ORDER — BRIMONIDINE TARTRATE 0.025 % OP SOLN: 1.0000 [drp] | Freq: Every day | OPHTHALMIC | Status: DC

## 2021-05-14 MED ORDER — POTASSIUM CHLORIDE CRYS ER 20 MEQ PO TBCR
40.0000 meq | EXTENDED_RELEASE_TABLET | ORAL | Status: AC
  Administered 2021-05-14: 40 meq via ORAL
  Filled 2021-05-14: qty 2

## 2021-05-14 MED ORDER — DULOXETINE HCL 20 MG PO CPEP
40.0000 mg | ORAL_CAPSULE | Freq: Every day | ORAL | Status: DC
  Administered 2021-05-15 – 2021-05-16 (×2): 40 mg via ORAL
  Filled 2021-05-14 (×2): qty 2

## 2021-05-14 MED ORDER — AMITRIPTYLINE HCL 25 MG PO TABS
50.0000 mg | ORAL_TABLET | Freq: Every day | ORAL | Status: DC
  Administered 2021-05-14 – 2021-05-15 (×2): 50 mg via ORAL
  Filled 2021-05-14 (×2): qty 2

## 2021-05-14 MED ORDER — GABAPENTIN 300 MG PO CAPS
300.0000 mg | ORAL_CAPSULE | Freq: Every day | ORAL | Status: DC
  Administered 2021-05-15 – 2021-05-16 (×2): 300 mg via ORAL
  Filled 2021-05-14 (×3): qty 1

## 2021-05-14 MED ORDER — LEFLUNOMIDE 20 MG PO TABS
20.0000 mg | ORAL_TABLET | Freq: Every day | ORAL | Status: DC
Start: 2021-05-14 — End: 2021-05-16
  Administered 2021-05-15 – 2021-05-16 (×2): 20 mg via ORAL
  Filled 2021-05-14 (×3): qty 1

## 2021-05-14 MED ORDER — DOXEPIN HCL 50 MG PO CAPS
50.0000 mg | ORAL_CAPSULE | Freq: Every day | ORAL | Status: DC
  Administered 2021-05-14 – 2021-05-15 (×2): 50 mg via ORAL
  Filled 2021-05-14 (×4): qty 1

## 2021-05-14 MED ORDER — FLUTICASONE FUROATE-VILANTEROL 200-25 MCG/ACT IN AEPB
1.0000 | INHALATION_SPRAY | Freq: Every day | RESPIRATORY_TRACT | Status: DC
  Administered 2021-05-15 – 2021-05-16 (×2): 1 via RESPIRATORY_TRACT
  Filled 2021-05-14 (×2): qty 28

## 2021-05-14 NOTE — ED Notes (Signed)
Pt resting in bed eyes closed, respirations are even and non-labored.  NAD

## 2021-05-14 NOTE — Progress Notes (Signed)
Progress Note    Katherine Perez  ION:[id number] DOB: 09/03/1970  DOA: 05/13/2021 PCP: Gladstone Lighter, MD      Brief Narrative:    Medical records reviewed and are as summarized below:  Katherine Perez is a 51 y.o. female with medical history significant for eating disorder, chronic constipation, gastroparesis, rheumatoid arthritis, CKD stage IV, anxiety, neuropathy, s/p biliary stent, was brought to the hospital because of altered mental status.      Assessment/Plan:   Principal Problem:   Metabolic acidosis Active Problems:   Acute renal failure superimposed on stage 4 chronic kidney disease (HCC)   Acute metabolic encephalopathy   Neuropathy   Chronic interstitial cystitis   Laxative habit   Rheumatoid arthritis involving multiple sites with positive rheumatoid factor (HCC)   Chronic constipation   AKI on CKD stage IV with metabolic acidosis: Continue sodium bicarbonate infusion.  Monitor BMP.  Follow-up with nephrologist.  Acute metabolic encephalopathy: Continue supportive care  Chronic constipation on multiple laxatives  Gastroparesis: Continue Reglan  Neuropathy: Continue analgesics     Diet Order             Diet clear liquid Room service appropriate? Yes; Fluid consistency: Thin  Diet effective now                      Consultants: Nephrologist  Procedures: None    Medications:    amitriptyline  50 mg Oral QHS   Brimonidine Tartrate  1 drop Both Eyes Daily   doxepin  50 mg Oral QHS   [START ON 05/15/2021] DULoxetine  40 mg Oral q AM   enoxaparin (LOVENOX) injection  30 mg Subcutaneous Q24H   Fiber   Oral Daily   fluticasone furoate-vilanterol  1 puff Inhalation Daily   [START ON 05/15/2021] gabapentin  300 mg Oral Daily   leflunomide  20 mg Oral Daily   potassium chloride  40 mEq Oral Q4H   Continuous Infusions:  sodium bicarbonate 150 mEq in D5W infusion 100 mL/hr at 05/14/21 1521      Anti-infectives (From admission, onward)    None              Family Communication/Anticipated D/C date and plan/Code Status   DVT prophylaxis: enoxaparin (LOVENOX) injection 30 mg Start: 05/13/21 2230     Code Status: Full Code  Family Communication: None Disposition Plan: Plan to discharge home when medically   Status is: Inpatient  Remains inpatient appropriate because: IV fluids           Subjective:   Interval events noted.  She is confused unable to provide any history.  Objective:    Vitals:   05/14/21 0315 05/14/21 0630 05/14/21 1000 05/14/21 1100  BP: 123/60 (!) 163/73 (!) 149/70 (!) 156/80  Pulse: 71 85 86 90  Resp: 14 15 17    Temp:      TempSrc:      SpO2: 98% 91% 98% 99%   No data found.   Intake/Output Summary (Last 24 hours) at 05/14/2021 1548 Last data filed at 05/13/2021 2034 Gross per 24 hour  Intake 499.5 ml  Output --  Net 499.5 ml   There were no vitals filed for this visit.  Exam:  GEN: NAD SKIN: Warm and dry EYES: No pallor or icterus ENT: MMM CV: RRR PULM: CTA B ABD: soft, ND, NT, +BS CNS: Alert, she was unable to answer any questions.  She cannot tell me  her name.  She just mumbles a few words.  Non focal EXT: No edema or tenderness        Data Reviewed:   I have personally reviewed following labs and imaging studies:  Labs: Labs show the following:   Basic Metabolic Panel: Recent Labs  Lab 05/13/21 1437 05/13/21 1952 05/13/21 2358 05/14/21 1044  NA 138  --  139 142  K 3.8  --  3.7 2.9*  CL 115*  --  119* 118*  CO2 <7*  --  <7* 11*  GLUCOSE 73  --  90 96  BUN 68*  --  67* 61*  CREATININE 2.45*  --  2.36* 2.25*  CALCIUM 8.8*  --  8.6* 8.4*  MG  --  2.0  --   --   PHOS  --  7.0*  --   --    GFR CrCl cannot be calculated (Unknown ideal weight.). Liver Function Tests: Recent Labs  Lab 05/13/21 1437  AST 14*  ALT 15  ALKPHOS 131*  BILITOT 0.5  PROT 7.0  ALBUMIN 2.9*    Recent Labs  Lab 05/13/21 1952  LIPASE 39   Recent Labs  Lab 05/13/21 1437  AMMONIA 15   Coagulation profile No results for input(s): INR, PROTIME in the last 168 hours.  CBC: Recent Labs  Lab 05/13/21 1437 05/13/21 2358  WBC 14.6* 14.2*  HGB 10.2* 9.4*  HCT 34.1* 31.5*  MCV 101.8* 101.0*  PLT 425* 400   Cardiac Enzymes: No results for input(s): CKTOTAL, CKMB, CKMBINDEX, TROPONINI in the last 168 hours. BNP (last 3 results) No results for input(s): PROBNP in the last 8760 hours. CBG: No results for input(s): GLUCAP in the last 168 hours. D-Dimer: No results for input(s): DDIMER in the last 72 hours. Hgb A1c: No results for input(s): HGBA1C in the last 72 hours. Lipid Profile: No results for input(s): CHOL, HDL, LDLCALC, TRIG, CHOLHDL, LDLDIRECT in the last 72 hours. Thyroid function studies: No results for input(s): TSH, T4TOTAL, T3FREE, THYROIDAB in the last 72 hours.  Invalid input(s): FREET3 Anemia work up: No results for input(s): VITAMINB12, FOLATE, FERRITIN, TIBC, IRON, RETICCTPCT in the last 72 hours. Sepsis Labs: Recent Labs  Lab 05/13/21 1437 05/13/21 1952 05/13/21 2358  WBC 14.6*  --  14.2*  LATICACIDVEN  --  0.9 1.1    Microbiology Recent Results (from the past 240 hour(s))  Resp Panel by RT-PCR (Flu A&B, Covid) Nasopharyngeal Swab     Status: None   Collection Time: 05/13/21  7:33 PM   Specimen: Nasopharyngeal Swab; Nasopharyngeal(NP) swabs in vial transport medium  Result Value Ref Range Status   SARS Coronavirus 2 by RT PCR NEGATIVE NEGATIVE Final    Comment: (NOTE) SARS-CoV-2 target nucleic acids are NOT DETECTED.  The SARS-CoV-2 RNA is generally detectable in upper respiratory specimens during the acute phase of infection. The lowest concentration of SARS-CoV-2 viral copies this assay can detect is 138 copies/mL. A negative result does not preclude SARS-Cov-2 infection and should not be used as the sole basis for treatment  or other patient management decisions. A negative result may occur with  improper specimen collection/handling, submission of specimen other than nasopharyngeal swab, presence of viral mutation(s) within the areas targeted by this assay, and inadequate number of viral copies(<138 copies/mL). A negative result must be combined with clinical observations, patient history, and epidemiological information. The expected result is Negative.  Fact Sheet for Patients:  EntrepreneurPulse.com.au  Fact Sheet for Healthcare Providers:  IncredibleEmployment.be  This test is no t yet approved or cleared by the [place] and  has been authorized for detection and/or diagnosis of SARS-CoV-2 by FDA under an Emergency Use Authorization (EUA). This EUA will remain  in effect (meaning this test can be used) for the duration of the COVID-19 declaration under Section 564(b)(1) of the Act, 21 U.S.C.section 360bbb-3(b)(1), unless the authorization is terminated  or revoked sooner.       Influenza A by PCR NEGATIVE NEGATIVE Final   Influenza B by PCR NEGATIVE NEGATIVE Final    Comment: (NOTE) The Xpert Xpress SARS-CoV-2/FLU/RSV plus assay is intended as an aid in the diagnosis of influenza from Nasopharyngeal swab specimens and should not be used as a sole basis for treatment. Nasal washings and aspirates are unacceptable for Xpert Xpress SARS-CoV-2/FLU/RSV testing.  Fact Sheet for Patients: EntrepreneurPulse.com.au  Fact Sheet for Healthcare Providers: IncredibleEmployment.be  This test is not yet approved or cleared by the [place] FDA and has been authorized for detection and/or diagnosis of SARS-CoV-2 by FDA under an Emergency Use Authorization (EUA). This EUA will remain in effect (meaning this test can be used) for the duration of the COVID-19 declaration under Section 564(b)(1) of the Act, 21 U.S.C. section  360bbb-3(b)(1), unless the authorization is terminated or revoked.  Performed at [facility], [address] [place], [place] [address]   Blood culture (routine x 2)     Status: None (Preliminary result)   Collection Time: 05/13/21  7:52 PM   Specimen: BLOOD  Result Value Ref Range Status   Specimen Description BLOOD RIGHT HAND  Final   Special Requests   Final    BOTTLES DRAWN AEROBIC ONLY Blood Culture results may not be optimal due to an inadequate volume of blood received in culture bottles   Culture   Final    NO GROWTH < 12 HOURS Performed at [facility], [address] [place], [place] [address]    Report Status PENDING  Incomplete  Blood culture (routine x 2)     Status: None (Preliminary result)   Collection Time: 05/13/21  7:57 PM   Specimen: BLOOD  Result Value Ref Range Status   Specimen Description BLOOD RIGHT ASSIST CONTROL  Final   Special Requests   Final    BOTTLES DRAWN AEROBIC AND ANAEROBIC Blood Culture adequate volume   Culture   Final    NO GROWTH < 12 HOURS Performed at [facility], [address] [place], [place] [address]    Report Status PENDING  Incomplete    Procedures and diagnostic studies:  DG Chest 2 View  Result Date: 05/13/2021 CLINICAL DATA:  Altered mental status, weakness EXAM: CHEST - 2 VIEW COMPARISON:  08/08/2020 FINDINGS: Heart and mediastinal contours are within normal limits. No focal opacities or effusions. No acute bony abnormality. IMPRESSION: No active cardiopulmonary disease. Electronically Signed   By: Rolm Baptise M.D.   On: 05/13/2021 19:30               LOS: 1 day   Angelo Caroll  [facility]   Pager on [email or web address]. If 7PM-7AM, please contact night-coverage at [email or web address]     05/14/2021, 3:48 PM

## 2021-05-14 NOTE — ED Notes (Signed)
Pt pure wick has leaked in bed. Pt is cleaned up. Clean sheets are placed on bed. Pt repositioned. Pt denies any other needs at this time.

## 2021-05-14 NOTE — Progress Notes (Signed)
[facility], [place] 05/14/21  Subjective:   LOS: 1 01/21 0701 - 01/22 0700 In: 499.5 [IV Piggyback:499.5] Out: -   Patient known to our practice from outpatient follow up Comes in for lethargy, forgetfulness, altered mental status Covid and influenza neg Nephrology consult for AKI and acidosis Patient seen in the emergency room.  Very lethargic and slow to respond   Objective:  Vital signs in last 24 hours:  Temp:  [97.8 F (36.6 C)-98.1 F (36.7 C)] 97.8 F (36.6 C) (01/21 1819) Pulse Rate:  [60-94] 85 (01/22 0630) Resp:  [14-21] 15 (01/22 0630) BP: (123-164)/(60-103) 163/73 (01/22 0630) SpO2:  [91 %-100 %] 91 % (01/22 0630)  Weight change:  There were no vitals filed for this visit.  Intake/Output:    Intake/Output Summary (Last 24 hours) at 05/14/2021 1013 Last data filed at 05/13/2021 2034 Gross per 24 hour  Intake 499.5 ml  Output --  Net 499.5 ml     Physical Exam: General: Thin, cachectic, laying in the bed  HEENT Oral mucous membranes are dry  Pulm/lungs Normal breathing effort on room air, no crackles  CVS/Heart Regular, no rub  Abdomen:  Soft, nontender  Extremities: No peripheral edema  Neurologic: Alert, able to answer simple questions but slow to respond  Skin: Decreased turgor       Basic Metabolic Panel:  Recent Labs  Lab 05/13/21 1437 05/13/21 1952 05/13/21 2358  NA 138  --  139  K 3.8  --  3.7  CL 115*  --  119*  CO2 <7*  --  <7*  GLUCOSE 73  --  90  BUN 68*  --  67*  CREATININE 2.45*  --  2.36*  CALCIUM 8.8*  --  8.6*  MG  --  2.0  --   PHOS  --  7.0*  --      CBC: Recent Labs  Lab 05/13/21 1437 05/13/21 2358  WBC 14.6* 14.2*  HGB 10.2* 9.4*  HCT 34.1* 31.5*  MCV 101.8* 101.0*  PLT 425* 400     No results found for: HEPBSAG, HEPBSAB, HEPBIGM    Microbiology:  Recent Results (from the past 240 hour(s))  Resp Panel by RT-PCR (Flu A&B, Covid) Nasopharyngeal Swab     Status: None    Collection Time: 05/13/21  7:33 PM   Specimen: Nasopharyngeal Swab; Nasopharyngeal(NP) swabs in vial transport medium  Result Value Ref Range Status   SARS Coronavirus 2 by RT PCR NEGATIVE NEGATIVE Final    Comment: (NOTE) SARS-CoV-2 target nucleic acids are NOT DETECTED.  The SARS-CoV-2 RNA is generally detectable in upper respiratory specimens during the acute phase of infection. The lowest concentration of SARS-CoV-2 viral copies this assay can detect is 138 copies/mL. A negative result does not preclude SARS-Cov-2 infection and should not be used as the sole basis for treatment or other patient management decisions. A negative result may occur with  improper specimen collection/handling, submission of specimen other than nasopharyngeal swab, presence of viral mutation(s) within the areas targeted by this assay, and inadequate number of viral copies(<138 copies/mL). A negative result must be combined with clinical observations, patient history, and epidemiological information. The expected result is Negative.  Fact Sheet for Patients:  EntrepreneurPulse.com.au  Fact Sheet for Healthcare Providers:  IncredibleEmployment.be  This test is no t yet approved or cleared by the [place] FDA and  has been authorized for detection and/or diagnosis of SARS-CoV-2 by FDA under an Emergency Use Authorization (EUA). This  EUA will remain  in effect (meaning this test can be used) for the duration of the COVID-19 declaration under Section 564(b)(1) of the Act, 21 U.S.C.section 360bbb-3(b)(1), unless the authorization is terminated  or revoked sooner.       Influenza A by PCR NEGATIVE NEGATIVE Final   Influenza B by PCR NEGATIVE NEGATIVE Final    Comment: (NOTE) The Xpert Xpress SARS-CoV-2/FLU/RSV plus assay is intended as an aid in the diagnosis of influenza from Nasopharyngeal swab specimens and should not be used as a sole basis for treatment.  Nasal washings and aspirates are unacceptable for Xpert Xpress SARS-CoV-2/FLU/RSV testing.  Fact Sheet for Patients: EntrepreneurPulse.com.au  Fact Sheet for Healthcare Providers: IncredibleEmployment.be  This test is not yet approved or cleared by the [place] FDA and has been authorized for detection and/or diagnosis of SARS-CoV-2 by FDA under an Emergency Use Authorization (EUA). This EUA will remain in effect (meaning this test can be used) for the duration of the COVID-19 declaration under Section 564(b)(1) of the Act, 21 U.S.C. section 360bbb-3(b)(1), unless the authorization is terminated or revoked.  Performed at [facility], [address] [place], [place] [address]   Blood culture (routine x 2)     Status: None (Preliminary result)   Collection Time: 05/13/21  7:52 PM   Specimen: BLOOD  Result Value Ref Range Status   Specimen Description BLOOD RIGHT HAND  Final   Special Requests   Final    BOTTLES DRAWN AEROBIC ONLY Blood Culture results may not be optimal due to an inadequate volume of blood received in culture bottles   Culture   Final    NO GROWTH < 12 HOURS Performed at [facility], [address], [place], [place] [address]    Report Status PENDING  Incomplete  Blood culture (routine x 2)     Status: None (Preliminary result)   Collection Time: 05/13/21  7:57 PM   Specimen: BLOOD  Result Value Ref Range Status   Specimen Description BLOOD RIGHT ASSIST CONTROL  Final   Special Requests   Final    BOTTLES DRAWN AEROBIC AND ANAEROBIC Blood Culture adequate volume   Culture   Final    NO GROWTH < 12 HOURS Performed at [facility], [address] [place], [place] [address]    Report Status PENDING  Incomplete    Coagulation Studies: No results for input(s): LABPROT, INR in the last 72 hours.  Urinalysis: No results for input(s): COLORURINE, LABSPEC, PHURINE, GLUCOSEU, HGBUR,  BILIRUBINUR, KETONESUR, PROTEINUR, UROBILINOGEN, NITRITE, LEUKOCYTESUR in the last 72 hours.  Invalid input(s): APPERANCEUR    Imaging: DG Chest 2 View  Result Date: 05/13/2021 CLINICAL DATA:  Altered mental status, weakness EXAM: CHEST - 2 VIEW COMPARISON:  08/08/2020 FINDINGS: Heart and mediastinal contours are within normal limits. No focal opacities or effusions. No acute bony abnormality. IMPRESSION: No active cardiopulmonary disease. Electronically Signed   By: Rolm Baptise M.D.   On: 05/13/2021 19:30     Medications:     enoxaparin (LOVENOX) injection  30 mg Subcutaneous Q24H   Fiber   Oral Daily   acetaminophen **OR** acetaminophen, albuterol, bisacodyl, prochlorperazine  Assessment/ Plan:  51 y.o. 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 AKI on CKD st 3a with proteinuria #Severe dehydration Recent Labs    05/13/21 1437 05/13/21 2358  CREATININE 2.45* 2.36*  GFR 25 Baseline Creatinine 1.39/GFR 47 from 03/21/21 AKI likely secondary to dehydration/volume depletion Agree with aggressive  iv fluid resuscitation   #. Anemia of CKD  Lab Results  Component Value Date   HGB 9.4 (L) 05/13/2021    #. Acute Severe Metabolic acidosis, mixed with lactic acidosis H/o laxative abuse Agree with iv bicarb supplementation        LOS: 1 Katherine Perez 1/22/202310:13  AM  [place], [place]

## 2021-05-14 NOTE — ED Notes (Signed)
Secure msg sent to Gwenette Greet, RN for ED to [employer].

## 2021-05-14 NOTE — Progress Notes (Signed)
Patient history reviewed, no questions at this time. Room currently dirty, will let ED RN know once room is clean

## 2021-05-15 DIAGNOSIS — L89152 Pressure ulcer of sacral region, stage 2: Secondary | ICD-10-CM | POA: Diagnosis present

## 2021-05-15 LAB — CBC WITH DIFFERENTIAL/PLATELET
Abs Immature Granulocytes: 0.1 10*3/uL — ABNORMAL HIGH (ref 0.00–0.07)
Basophils Absolute: 0.1 10*3/uL (ref 0.0–0.1)
Basophils Relative: 1 %
Eosinophils Absolute: 0.2 10*3/uL (ref 0.0–0.5)
Eosinophils Relative: 2 %
HCT: 27.3 % — ABNORMAL LOW (ref 36.0–46.0)
Hemoglobin: 8.8 g/dL — ABNORMAL LOW (ref 12.0–15.0)
Immature Granulocytes: 1 %
Lymphocytes Relative: 18 %
Lymphs Abs: 2 10*3/uL (ref 0.7–4.0)
MCH: 30.8 pg (ref 26.0–34.0)
MCHC: 32.2 g/dL (ref 30.0–36.0)
MCV: 95.5 fL (ref 80.0–100.0)
Monocytes Absolute: 1.4 10*3/uL — ABNORMAL HIGH (ref 0.1–1.0)
Monocytes Relative: 13 %
Neutro Abs: 7.1 10*3/uL (ref 1.7–7.7)
Neutrophils Relative %: 65 %
Platelets: 348 10*3/uL (ref 150–400)
RBC: 2.86 MIL/uL — ABNORMAL LOW (ref 3.87–5.11)
RDW: 15.4 % (ref 11.5–15.5)
WBC: 10.9 10*3/uL — ABNORMAL HIGH (ref 4.0–10.5)
nRBC: 0 % (ref 0.0–0.2)

## 2021-05-15 LAB — BASIC METABOLIC PANEL
Anion gap: 10 (ref 5–15)
BUN: 51 mg/dL — ABNORMAL HIGH (ref 6–20)
CO2: 17 mmol/L — ABNORMAL LOW (ref 22–32)
Calcium: 7.8 mg/dL — ABNORMAL LOW (ref 8.9–10.3)
Chloride: 115 mmol/L — ABNORMAL HIGH (ref 98–111)
Creatinine, Ser: 1.91 mg/dL — ABNORMAL HIGH (ref 0.44–1.00)
GFR, Estimated: 32 mL/min — ABNORMAL LOW (ref >60)
Glucose, Bld: 108 mg/dL — ABNORMAL HIGH (ref 70–99)
Potassium: 2.3 mmol/L — CL (ref 3.5–5.1)
Sodium: 142 mmol/L (ref 135–145)

## 2021-05-15 LAB — MAGNESIUM: Magnesium: 1.6 mg/dL — ABNORMAL LOW (ref 1.7–2.4)

## 2021-05-15 MED ORDER — POTASSIUM CHLORIDE CRYS ER 20 MEQ PO TBCR
40.0000 meq | EXTENDED_RELEASE_TABLET | Freq: Once | ORAL | Status: AC
  Administered 2021-05-15: 40 meq via ORAL
  Filled 2021-05-15: qty 2

## 2021-05-15 MED ORDER — POTASSIUM CHLORIDE 10 MEQ/100ML IV SOLN
10.0000 meq | INTRAVENOUS | Status: AC
  Administered 2021-05-15 (×6): 10 meq via INTRAVENOUS
  Filled 2021-05-15 (×6): qty 100

## 2021-05-15 MED ORDER — MAGNESIUM SULFATE 2 GM/50ML IV SOLN
2.0000 g | Freq: Once | INTRAVENOUS | Status: AC
  Administered 2021-05-15: 2 g via INTRAVENOUS
  Filled 2021-05-15: qty 50

## 2021-05-15 MED ORDER — POTASSIUM CHLORIDE 10 MEQ/100ML IV SOLN
10.0000 meq | INTRAVENOUS | Status: AC
  Administered 2021-05-15 (×2): 10 meq via INTRAVENOUS
  Filled 2021-05-15 (×2): qty 100

## 2021-05-15 NOTE — Progress Notes (Signed)
Noted critical value for serum potassium: 2.3 mmol/L. NP Foust made aware. Pt not in distress. Currently asleep. Will continue to monitor.

## 2021-05-15 NOTE — Progress Notes (Addendum)
Progress Note    Katherine Perez  PPJ:[id number] DOB: 1970/08/05  DOA: 05/13/2021 PCP: Gladstone Lighter, MD      Brief Narrative:    Medical records reviewed and are as summarized below:  Katherine Perez is a 51 y.o. female with medical history significant for eating disorder, chronic constipation, gastroparesis, rheumatoid arthritis, CKD stage IV, anxiety, neuropathy, s/p biliary stent, was brought to the hospital because of altered mental status.      Assessment/Plan:   Principal Problem:   Metabolic acidosis Active Problems:   Acute renal failure superimposed on stage 4 chronic kidney disease (HCC)   Acute metabolic encephalopathy   Neuropathy   Chronic interstitial cystitis   Laxative habit   Rheumatoid arthritis involving multiple sites with positive rheumatoid factor (HCC)   Chronic constipation   AKI on CKD stage IV with metabolic acidosis, hypokalemia: Creatinine and bicarbonate levels are slowly improving but potassium level is worse.  Continue IV sodium bicarbonate drip.  Replete potassium intravenously.  Case discussed with Dr. Candiss Norse, nephrologist.  Monitor BMP.  Hypomagnesemia: Replete magnesium with IV magnesium sulfate and monitor magnesium levels.  Acute metabolic encephalopathy: Continue supportive care  Chronic constipation on multiple laxatives  Gastroparesis: Continue Reglan  Neuropathy: Continue analgesics  Stage II coccygeal decubitus ulcer (present on admission): Continue local wound care     Diet Order             Diet regular Room service appropriate? Yes; Fluid consistency: Thin  Diet effective now                      Consultants: Nephrologist  Procedures: None    Medications:    amitriptyline  50 mg Oral QHS   doxepin  50 mg Oral QHS   DULoxetine  40 mg Oral Daily   enoxaparin (LOVENOX) injection  30 mg Subcutaneous Q24H   fluticasone furoate-vilanterol  1 puff Inhalation  Daily   gabapentin  300 mg Oral Daily   leflunomide  20 mg Oral Daily   Continuous Infusions:  potassium chloride 10 mEq (05/15/21 1613)   sodium bicarbonate 150 mEq in D5W infusion 100 mL/hr at 05/15/21 0502     Anti-infectives (From admission, onward)    None              Family Communication/Anticipated D/C date and plan/Code Status   DVT prophylaxis: enoxaparin (LOVENOX) injection 30 mg Start: 05/13/21 2230     Code Status: Full Code  Family Communication: Plan discussed with her husband at the bedside Disposition Plan: Plan to discharge home when medically   Status is: Inpatient  Remains inpatient appropriate because: IV fluids           Subjective:   Interval events noted.  She is unable to provide any history.  Her husband was at the bedside.  Objective:    Vitals:   05/15/21 0322 05/15/21 0700 05/15/21 1144 05/15/21 1526  BP: 134/72 (!) 125/56 140/78 120/68  Pulse: 74 84 79 79  Resp: 20 18 16 16   Temp: 98.2 F (36.8 C)  98.2 F (36.8 C) 97.9 F (36.6 C)  TempSrc:   Oral   SpO2: 100% 100% 100% 96%  Weight:      Height:       No data found.   Intake/Output Summary (Last 24 hours) at 05/15/2021 1619 Last data filed at 05/15/2021 0900 Gross per 24 hour  Intake 1253.23 ml  Output --  Net 1253.23 ml   Filed Weights   05/14/21 1939  Weight: 44.7 kg    Exam:  GEN: NAD SKIN: Warm and dry EYES: EOMI ENT: MMM CV: RRR PULM: CTA B ABD: soft, ND, NT, +BS CNS: Sleepy but arousable.  She did not answer any of my questions.  She does mumbled. EXT: No edema or tenderness     Pressure Injury 05/15/21 Coccyx Right;Left Stage 2 -  Partial thickness loss of dermis presenting as a shallow open injury with a red, pink wound bed without slough. (Active)  05/15/21 0400  Location: Coccyx  Location Orientation: Right;Left  Staging: Stage 2 -  Partial thickness loss of dermis presenting as a shallow open injury with a red, pink wound bed  without slough.  Wound Description (Comments):   Present on Admission: Yes     Data Reviewed:   I have personally reviewed following labs and imaging studies:  Labs: Labs show the following:   Basic Metabolic Panel: Recent Labs  Lab 05/13/21 1437 05/13/21 1952 05/13/21 2358 05/14/21 1044 05/15/21 0536  NA 138  --  139 142 142  K 3.8  --  3.7 2.9* 2.3*  CL 115*  --  119* 118* 115*  CO2 <7*  --  <7* 11* 17*  GLUCOSE 73  --  90 96 108*  BUN 68*  --  67* 61* 51*  CREATININE 2.45*  --  2.36* 2.25* 1.91*  CALCIUM 8.8*  --  8.6* 8.4* 7.8*  MG  --  2.0  --   --  1.6*  PHOS  --  7.0*  --   --   --    GFR Estimated Creatinine Clearance: 25.1 mL/min (A) (by C-G formula based on SCr of 1.91 mg/dL (H)). Liver Function Tests: Recent Labs  Lab 05/13/21 1437  AST 14*  ALT 15  ALKPHOS 131*  BILITOT 0.5  PROT 7.0  ALBUMIN 2.9*   Recent Labs  Lab 05/13/21 1952  LIPASE 39   Recent Labs  Lab 05/13/21 1437  AMMONIA 15   Coagulation profile No results for input(s): INR, PROTIME in the last 168 hours.  CBC: Recent Labs  Lab 05/13/21 1437 05/13/21 2358 05/15/21 0536  WBC 14.6* 14.2* 10.9*  NEUTROABS  --   --  7.1  HGB 10.2* 9.4* 8.8*  HCT 34.1* 31.5* 27.3*  MCV 101.8* 101.0* 95.5  PLT 425* 400 348   Cardiac Enzymes: No results for input(s): CKTOTAL, CKMB, CKMBINDEX, TROPONINI in the last 168 hours. BNP (last 3 results) No results for input(s): PROBNP in the last 8760 hours. CBG: No results for input(s): GLUCAP in the last 168 hours. D-Dimer: No results for input(s): DDIMER in the last 72 hours. Hgb A1c: No results for input(s): HGBA1C in the last 72 hours. Lipid Profile: No results for input(s): CHOL, HDL, LDLCALC, TRIG, CHOLHDL, LDLDIRECT in the last 72 hours. Thyroid function studies: No results for input(s): TSH, T4TOTAL, T3FREE, THYROIDAB in the last 72 hours.  Invalid input(s): FREET3 Anemia work up: No results for input(s): VITAMINB12, FOLATE,  FERRITIN, TIBC, IRON, RETICCTPCT in the last 72 hours. Sepsis Labs: Recent Labs  Lab 05/13/21 1437 05/13/21 1952 05/13/21 2358 05/15/21 0536  WBC 14.6*  --  14.2* 10.9*  LATICACIDVEN  --  0.9 1.1  --     Microbiology Recent Results (from the past 240 hour(s))  Resp Panel by RT-PCR (Flu A&B, Covid) Nasopharyngeal Swab     Status: None   Collection Time: 05/13/21  7:33 PM  Specimen: Nasopharyngeal Swab; Nasopharyngeal(NP) swabs in vial transport medium  Result Value Ref Range Status   SARS Coronavirus 2 by RT PCR NEGATIVE NEGATIVE Final    Comment: (NOTE) SARS-CoV-2 target nucleic acids are NOT DETECTED.  The SARS-CoV-2 RNA is generally detectable in upper respiratory specimens during the acute phase of infection. The lowest concentration of SARS-CoV-2 viral copies this assay can detect is 138 copies/mL. A negative result does not preclude SARS-Cov-2 infection and should not be used as the sole basis for treatment or other patient management decisions. A negative result may occur with  improper specimen collection/handling, submission of specimen other than nasopharyngeal swab, presence of viral mutation(s) within the areas targeted by this assay, and inadequate number of viral copies(<138 copies/mL). A negative result must be combined with clinical observations, patient history, and epidemiological information. The expected result is Negative.  Fact Sheet for Patients:  EntrepreneurPulse.com.au  Fact Sheet for Healthcare Providers:  IncredibleEmployment.be  This test is no t yet approved or cleared by the [place] FDA and  has been authorized for detection and/or diagnosis of SARS-CoV-2 by FDA under an Emergency Use Authorization (EUA). This EUA will remain  in effect (meaning this test can be used) for the duration of the COVID-19 declaration under Section 564(b)(1) of the Act, 21 U.S.C.section 360bbb-3(b)(1), unless the  authorization is terminated  or revoked sooner.       Influenza A by PCR NEGATIVE NEGATIVE Final   Influenza B by PCR NEGATIVE NEGATIVE Final    Comment: (NOTE) The Xpert Xpress SARS-CoV-2/FLU/RSV plus assay is intended as an aid in the diagnosis of influenza from Nasopharyngeal swab specimens and should not be used as a sole basis for treatment. Nasal washings and aspirates are unacceptable for Xpert Xpress SARS-CoV-2/FLU/RSV testing.  Fact Sheet for Patients: EntrepreneurPulse.com.au  Fact Sheet for Healthcare Providers: IncredibleEmployment.be  This test is not yet approved or cleared by the [place] FDA and has been authorized for detection and/or diagnosis of SARS-CoV-2 by FDA under an Emergency Use Authorization (EUA). This EUA will remain in effect (meaning this test can be used) for the duration of the COVID-19 declaration under Section 564(b)(1) of the Act, 21 U.S.C. section 360bbb-3(b)(1), unless the authorization is terminated or revoked.  Performed at [facility], [address] [place], [place] [address]   Blood culture (routine x 2)     Status: None (Preliminary result)   Collection Time: 05/13/21  7:52 PM   Specimen: BLOOD  Result Value Ref Range Status   Specimen Description BLOOD RIGHT HAND  Final   Special Requests   Final    BOTTLES DRAWN AEROBIC ONLY Blood Culture results may not be optimal due to an inadequate volume of blood received in culture bottles   Culture   Final    NO GROWTH 2 DAYS Performed at [facility], [address], [place], [place] [address]    Report Status PENDING  Incomplete  Blood culture (routine x 2)     Status: None (Preliminary result)   Collection Time: 05/13/21  7:57 PM   Specimen: BLOOD  Result Value Ref Range Status   Specimen Description BLOOD RIGHT ASSIST CONTROL  Final   Special Requests   Final    BOTTLES DRAWN AEROBIC AND ANAEROBIC Blood Culture  adequate volume   Culture   Final    NO GROWTH 2 DAYS Performed at [facility], [address], [place], [place] [address]    Report Status PENDING  Incomplete  Procedures and diagnostic studies:  DG Chest 2 View  Result Date: 05/13/2021 CLINICAL DATA:  Altered mental status, weakness EXAM: CHEST - 2 VIEW COMPARISON:  08/08/2020 FINDINGS: Heart and mediastinal contours are within normal limits. No focal opacities or effusions. No acute bony abnormality. IMPRESSION: No active cardiopulmonary disease. Electronically Signed   By: Rolm Baptise M.D.   On: 05/13/2021 19:30               LOS: 2 days   Macdonald Rigor  [facility]   Pager on [email or web address]. If 7PM-7AM, please contact night-coverage at [email or web address]     05/15/2021, 4:19 PM

## 2021-05-15 NOTE — Progress Notes (Addendum)
[facility], [place] 05/15/21  Subjective:   LOS: 2 01/22 0701 - 01/23 0700 In: 1253.2 [P.O.:200; I.V.:1053.2] Out: -   Patient seen resting in bed Alert, oriented, delayed response States she's tolerated meals, unable to verbalize what she ate for breakfast Remains on room air No edema   Objective:  Vital signs in last 24 hours:  Temp:  [97.5 F (36.4 C)-98.5 F (36.9 C)] 98.2 F (36.8 C) (01/23 1144) Pulse Rate:  [74-84] 79 (01/23 1144) Resp:  [16-20] 16 (01/23 1144) BP: (125-159)/(56-83) 140/78 (01/23 1144) SpO2:  [98 %-100 %] 100 % (01/23 1144) Weight:  [44.7 kg] 44.7 kg (01/22 1939)  Weight change:  Filed Weights   05/14/21 1939  Weight: 44.7 kg    Intake/Output:    Intake/Output Summary (Last 24 hours) at 05/15/2021 1440 Last data filed at 05/15/2021 0900 Gross per 24 hour  Intake 1253.23 ml  Output --  Net 1253.23 ml      Physical Exam: General: Thin, cachectic, laying in the bed  HEENT Oral mucous membranes are dry  Pulm/lungs Normal breathing effort on room air, no crackles, Lt wheeze  CVS/Heart Regular, no rub  Abdomen:  Soft, nontender  Extremities: No peripheral edema  Neurologic: Alert, able to answer simple questions but slow to respond  Skin: Decreased turgor       Basic Metabolic Panel:  Recent Labs  Lab 05/13/21 1437 05/13/21 1952 05/13/21 2358 05/14/21 1044 05/15/21 0536  NA 138  --  139 142 142  K 3.8  --  3.7 2.9* 2.3*  CL 115*  --  119* 118* 115*  CO2 <7*  --  <7* 11* 17*  GLUCOSE 73  --  90 96 108*  BUN 68*  --  67* 61* 51*  CREATININE 2.45*  --  2.36* 2.25* 1.91*  CALCIUM 8.8*  --  8.6* 8.4* 7.8*  MG  --  2.0  --   --  1.6*  PHOS  --  7.0*  --   --   --       CBC: Recent Labs  Lab 05/13/21 1437 05/13/21 2358 05/15/21 0536  WBC 14.6* 14.2* 10.9*  NEUTROABS  --   --  7.1  HGB 10.2* 9.4* 8.8*  HCT 34.1* 31.5* 27.3*  MCV 101.8* 101.0* 95.5  PLT 425* 400 348      No results  found for: HEPBSAG, HEPBSAB, HEPBIGM    Microbiology:  Recent Results (from the past 240 hour(s))  Resp Panel by RT-PCR (Flu A&B, Covid) Nasopharyngeal Swab     Status: None   Collection Time: 05/13/21  7:33 PM   Specimen: Nasopharyngeal Swab; Nasopharyngeal(NP) swabs in vial transport medium  Result Value Ref Range Status   SARS Coronavirus 2 by RT PCR NEGATIVE NEGATIVE Final    Comment: (NOTE) SARS-CoV-2 target nucleic acids are NOT DETECTED.  The SARS-CoV-2 RNA is generally detectable in upper respiratory specimens during the acute phase of infection. The lowest concentration of SARS-CoV-2 viral copies this assay can detect is 138 copies/mL. A negative result does not preclude SARS-Cov-2 infection and should not be used as the sole basis for treatment or other patient management decisions. A negative result may occur with  improper specimen collection/handling, submission of specimen other than nasopharyngeal swab, presence of viral mutation(s) within the areas targeted by this assay, and inadequate number of viral copies(<138 copies/mL). A negative result must be combined with clinical observations, patient history, and epidemiological information. The expected result is Negative.  Fact Sheet for Patients:  EntrepreneurPulse.com.au  Fact Sheet for Healthcare Providers:  IncredibleEmployment.be  This test is no t yet approved or cleared by the [place] FDA and  has been authorized for detection and/or diagnosis of SARS-CoV-2 by FDA under an Emergency Use Authorization (EUA). This EUA will remain  in effect (meaning this test can be used) for the duration of the COVID-19 declaration under Section 564(b)(1) of the Act, 21 U.S.C.section 360bbb-3(b)(1), unless the authorization is terminated  or revoked sooner.       Influenza A by PCR NEGATIVE NEGATIVE Final   Influenza B by PCR NEGATIVE NEGATIVE Final    Comment: (NOTE) The Xpert  Xpress SARS-CoV-2/FLU/RSV plus assay is intended as an aid in the diagnosis of influenza from Nasopharyngeal swab specimens and should not be used as a sole basis for treatment. Nasal washings and aspirates are unacceptable for Xpert Xpress SARS-CoV-2/FLU/RSV testing.  Fact Sheet for Patients: EntrepreneurPulse.com.au  Fact Sheet for Healthcare Providers: IncredibleEmployment.be  This test is not yet approved or cleared by the [place] FDA and has been authorized for detection and/or diagnosis of SARS-CoV-2 by FDA under an Emergency Use Authorization (EUA). This EUA will remain in effect (meaning this test can be used) for the duration of the COVID-19 declaration under Section 564(b)(1) of the Act, 21 U.S.C. section 360bbb-3(b)(1), unless the authorization is terminated or revoked.  Performed at [facility], [address] [place], [place] [address]   Blood culture (routine x 2)     Status: None (Preliminary result)   Collection Time: 05/13/21  7:52 PM   Specimen: BLOOD  Result Value Ref Range Status   Specimen Description BLOOD RIGHT HAND  Final   Special Requests   Final    BOTTLES DRAWN AEROBIC ONLY Blood Culture results may not be optimal due to an inadequate volume of blood received in culture bottles   Culture   Final    NO GROWTH 2 DAYS Performed at [facility], [address], [place], [place] [address]    Report Status PENDING  Incomplete  Blood culture (routine x 2)     Status: None (Preliminary result)   Collection Time: 05/13/21  7:57 PM   Specimen: BLOOD  Result Value Ref Range Status   Specimen Description BLOOD RIGHT ASSIST CONTROL  Final   Special Requests   Final    BOTTLES DRAWN AEROBIC AND ANAEROBIC Blood Culture adequate volume   Culture   Final    NO GROWTH 2 DAYS Performed at [facility], [address], [place], [place] [address]    Report Status PENDING  Incomplete     Coagulation Studies: No results for input(s): LABPROT, INR in the last 72 hours.  Urinalysis: No results for input(s): COLORURINE, LABSPEC, PHURINE, GLUCOSEU, HGBUR, BILIRUBINUR, KETONESUR, PROTEINUR, UROBILINOGEN, NITRITE, LEUKOCYTESUR in the last 72 hours.  Invalid input(s): APPERANCEUR    Imaging: DG Chest 2 View  Result Date: 05/13/2021 CLINICAL DATA:  Altered mental status, weakness EXAM: CHEST - 2 VIEW COMPARISON:  08/08/2020 FINDINGS: Heart and mediastinal contours are within normal limits. No focal opacities or effusions. No acute bony abnormality. IMPRESSION: No active cardiopulmonary disease. Electronically Signed   By: Rolm Baptise M.D.   On: 05/13/2021 19:30     Medications:    potassium chloride 10 mEq (05/15/21 1412)   sodium bicarbonate 150 mEq in D5W infusion 100 mL/hr at 05/15/21 0502    amitriptyline  50 mg Oral QHS   doxepin  50  mg Oral QHS   DULoxetine  40 mg Oral Daily   enoxaparin (LOVENOX) injection  30 mg Subcutaneous Q24H   fluticasone furoate-vilanterol  1 puff Inhalation Daily   gabapentin  300 mg Oral Daily   leflunomide  20 mg Oral Daily   acetaminophen **OR** acetaminophen, albuterol, bisacodyl, prochlorperazine  Assessment/ Plan:  51 y.o. 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 AKI on CKD st 3a with proteinuria #Severe  dehydration #Hypokalemia Recent Labs    05/13/21 1437 05/13/21 2358 05/14/21 1044 05/15/21 0536  CREATININE 2.45* 2.36* 2.25* 1.91*   GFR 25 Baseline Creatinine 1.39/GFR 47 from 03/21/21 AKI likely secondary to dehydration/volume depletion Renal function improving Continue IVF at this time Potassium 2.3, IV and oral supplementation ordered by primary team  #. Anemia of CKD  Lab Results  Component Value Date   HGB 8.8 (L) 05/15/2021   Will continue to monitor  #. Acute Severe Metabolic acidosis, mixed with lactic acidosis H/o laxative abuse Continue iv bicarb supplementation      LOS: [place] 1/23/20232:40 PM  [facility] [place], [place]

## 2021-05-16 LAB — MAGNESIUM: Magnesium: 1.8 mg/dL (ref 1.7–2.4)

## 2021-05-16 LAB — BASIC METABOLIC PANEL
Anion gap: 7 (ref 5–15)
BUN: 19 mg/dL (ref 6–20)
CO2: 22 mmol/L (ref 22–32)
Calcium: 9 mg/dL (ref 8.9–10.3)
Chloride: 106 mmol/L (ref 98–111)
Creatinine, Ser: 0.91 mg/dL (ref 0.44–1.00)
GFR, Estimated: >60 mL/min (ref >60)
Glucose, Bld: 127 mg/dL — ABNORMAL HIGH (ref 70–99)
Potassium: 4.5 mmol/L (ref 3.5–5.1)
Sodium: 135 mmol/L (ref 135–145)

## 2021-05-16 LAB — CBC WITH DIFFERENTIAL/PLATELET
Abs Immature Granulocytes: 0.06 10*3/uL (ref 0.00–0.07)
Basophils Absolute: 0.1 10*3/uL (ref 0.0–0.1)
Basophils Relative: 1 %
Eosinophils Absolute: 0.3 10*3/uL (ref 0.0–0.5)
Eosinophils Relative: 3 %
HCT: 25.5 % — ABNORMAL LOW (ref 36.0–46.0)
Hemoglobin: 8 g/dL — ABNORMAL LOW (ref 12.0–15.0)
Immature Granulocytes: 1 %
Lymphocytes Relative: 36 %
Lymphs Abs: 3.2 10*3/uL (ref 0.7–4.0)
MCH: 30.2 pg (ref 26.0–34.0)
MCHC: 31.4 g/dL (ref 30.0–36.0)
MCV: 96.2 fL (ref 80.0–100.0)
Monocytes Absolute: 1 10*3/uL (ref 0.1–1.0)
Monocytes Relative: 12 %
Neutro Abs: 4.3 10*3/uL (ref 1.7–7.7)
Neutrophils Relative %: 47 %
Platelets: 338 10*3/uL (ref 150–400)
RBC: 2.65 MIL/uL — ABNORMAL LOW (ref 3.87–5.11)
RDW: 15.1 % (ref 11.5–15.5)
WBC: 9 10*3/uL (ref 4.0–10.5)
nRBC: 0 % (ref 0.0–0.2)

## 2021-05-16 LAB — PHOSPHORUS: Phosphorus: 3.6 mg/dL (ref 2.5–4.6)

## 2021-05-16 NOTE — Discharge Summary (Signed)
EEG  Physician Discharge Summary  Katherine Andrew Ginnifer Creelman WNI:[id number] DOB: 1971/02/16 DOA: 05/13/2021  PCP: Gladstone Lighter, MD  Admit date: 05/13/2021 Discharge date: 05/16/2021  Discharge disposition: Home   Recommendations for Outpatient Follow-Up:   Follow-up with PCP in 1 week   Discharge Diagnosis:   Principal Problem:   Metabolic acidosis Active Problems:   Acute renal failure superimposed on stage 4 chronic kidney disease (HCC)   Acute metabolic encephalopathy   Neuropathy   Chronic interstitial cystitis   Laxative habit   Rheumatoid arthritis involving multiple sites with positive rheumatoid factor (HCC)   Chronic constipation   Decubitus ulcer of coccyx, stage 2 ([place])    Discharge Condition: Stable.  Diet recommendation:  Diet Order             Diet regular Room service appropriate? Yes; Fluid consistency: Thin  Diet effective now           Diet - low sodium heart healthy                     Code Status: Full Code     Hospital Course:   Katherine Perez is a 51 y.o. female with medical history significant for eating disorder, chronic constipation, gastroparesis, rheumatoid arthritis, CKD stage IV, anxiety, neuropathy, s/p biliary stent, who was brought to the hospital because of altered mental status.   She was admitted to the hospital for AKI with metabolic acidosis, hypokalemia and hypomagnesemia.  He was treated with IV sodium bicarbonate infusion.  Potassium and magnesium were repleted successfully.  She was also encephalopathic on admission.  AKI and metabolic encephalopathy have resolved.  Her condition has improved significantly and she is deemed stable for discharge to home.     Medical Consultants:   Nephrologist   Discharge Exam:    Vitals:   05/16/21 0018 05/16/21 0347 05/16/21 0758 05/16/21 1159  BP: 127/75 123/69 107/76 109/64  Pulse: 73 82 84 83  Resp: 18 20 20 20   Temp: 98.4 F (36.9 C)  98.3 F (36.8 C) 98.3 F (36.8 C) 97.8 F (36.6 C)  TempSrc: Oral Oral    SpO2: 96% 96% 95% 95%  Weight:      Height:         GEN: NAD SKIN: Warm and dry EYES: No pallor or icterus ENT: MMM CV: RRR PULM: CTA B ABD: soft, ND, NT, +BS CNS: AAO x 3, non focal EXT: No edema or tenderness   The results of significant diagnostics from this hospitalization (including imaging, microbiology, ancillary and laboratory) are listed below for reference.     Procedures and Diagnostic Studies:   DG Chest 2 View  Result Date: 05/13/2021 CLINICAL DATA:  Altered mental status, weakness EXAM: CHEST - 2 VIEW COMPARISON:  08/08/2020 FINDINGS: Heart and mediastinal contours are within normal limits. No focal opacities or effusions. No acute bony abnormality. IMPRESSION: No active cardiopulmonary disease. Electronically Signed   By: Rolm Baptise M.D.   On: 05/13/2021 19:30     Labs:   Basic Metabolic Panel: Recent Labs  Lab 05/13/21 1437 05/13/21 1952 05/13/21 2358 05/14/21 1044 05/15/21 0536 05/16/21 0640  NA 138  --  139 142 142 135  K 3.8  --  3.7 2.9* 2.3* 4.5  CL 115*  --  119* 118* 115* 106  CO2 <7*  --  <7* 11* 17* 22  GLUCOSE 73  --  90 96 108* 127*  BUN 68*  --  67* 61* 51* 19  CREATININE 2.45*  --  2.36* 2.25* 1.91* 0.91  CALCIUM 8.8*  --  8.6* 8.4* 7.8* 9.0  MG  --  2.0  --   --  1.6* 1.8  PHOS  --  7.0*  --   --   --  3.6   GFR Estimated Creatinine Clearance: 52.8 mL/min (by C-G formula based on SCr of 0.91 mg/dL). Liver Function Tests: Recent Labs  Lab 05/13/21 1437  AST 14*  ALT 15  ALKPHOS 131*  BILITOT 0.5  PROT 7.0  ALBUMIN 2.9*   Recent Labs  Lab 05/13/21 1952  LIPASE 39   Recent Labs  Lab 05/13/21 1437  AMMONIA 15   Coagulation profile No results for input(s): INR, PROTIME in the last 168 hours.  CBC: Recent Labs  Lab 05/13/21 1437 05/13/21 2358 05/15/21 0536 05/16/21 0711  WBC 14.6* 14.2* 10.9* 9.0  NEUTROABS  --   --  7.1 4.3  HGB  10.2* 9.4* 8.8* 8.0*  HCT 34.1* 31.5* 27.3* 25.5*  MCV 101.8* 101.0* 95.5 96.2  PLT 425* 400 348 338   Cardiac Enzymes: No results for input(s): CKTOTAL, CKMB, CKMBINDEX, TROPONINI in the last 168 hours. BNP: Invalid input(s): POCBNP CBG: No results for input(s): GLUCAP in the last 168 hours. D-Dimer No results for input(s): DDIMER in the last 72 hours. Hgb A1c No results for input(s): HGBA1C in the last 72 hours. Lipid Profile No results for input(s): CHOL, HDL, LDLCALC, TRIG, CHOLHDL, LDLDIRECT in the last 72 hours. Thyroid function studies No results for input(s): TSH, T4TOTAL, T3FREE, THYROIDAB in the last 72 hours.  Invalid input(s): FREET3 Anemia work up No results for input(s): VITAMINB12, FOLATE, FERRITIN, TIBC, IRON, RETICCTPCT in the last 72 hours. Microbiology Recent Results (from the past 240 hour(s))  Resp Panel by RT-PCR (Flu A&B, Covid) Nasopharyngeal Swab     Status: None   Collection Time: 05/13/21  7:33 PM   Specimen: Nasopharyngeal Swab; Nasopharyngeal(NP) swabs in vial transport medium  Result Value Ref Range Status   SARS Coronavirus 2 by RT PCR NEGATIVE NEGATIVE Final    Comment: (NOTE) SARS-CoV-2 target nucleic acids are NOT DETECTED.  The SARS-CoV-2 RNA is generally detectable in upper respiratory specimens during the acute phase of infection. The lowest concentration of SARS-CoV-2 viral copies this assay can detect is 138 copies/mL. A negative result does not preclude SARS-Cov-2 infection and should not be used as the sole basis for treatment or other patient management decisions. A negative result may occur with  improper specimen collection/handling, submission of specimen other than nasopharyngeal swab, presence of viral mutation(s) within the areas targeted by this assay, and inadequate number of viral copies(<138 copies/mL). A negative result must be combined with clinical observations, patient history, and epidemiological information. The  expected result is Negative.  Fact Sheet for Patients:  EntrepreneurPulse.com.au  Fact Sheet for Healthcare Providers:  IncredibleEmployment.be  This test is no t yet approved or cleared by the [place] FDA and  has been authorized for detection and/or diagnosis of SARS-CoV-2 by FDA under an Emergency Use Authorization (EUA). This EUA will remain  in effect (meaning this test can be used) for the duration of the COVID-19 declaration under Section 564(b)(1) of the Act, 21 U.S.C.section 360bbb-3(b)(1), unless the authorization is terminated  or revoked sooner.       Influenza A by PCR NEGATIVE NEGATIVE Final   Influenza B by PCR NEGATIVE NEGATIVE Final    Comment: (NOTE) The Xpert Xpress SARS-CoV-2/FLU/RSV  plus assay is intended as an aid in the diagnosis of influenza from Nasopharyngeal swab specimens and should not be used as a sole basis for treatment. Nasal washings and aspirates are unacceptable for Xpert Xpress SARS-CoV-2/FLU/RSV testing.  Fact Sheet for Patients: EntrepreneurPulse.com.au  Fact Sheet for Healthcare Providers: IncredibleEmployment.be  This test is not yet approved or cleared by the [place] FDA and has been authorized for detection and/or diagnosis of SARS-CoV-2 by FDA under an Emergency Use Authorization (EUA). This EUA will remain in effect (meaning this test can be used) for the duration of the COVID-19 declaration under Section 564(b)(1) of the Act, 21 U.S.C. section 360bbb-3(b)(1), unless the authorization is terminated or revoked.  Performed at [facility], [address] [place], [place] [address]   Blood culture (routine x 2)     Status: None (Preliminary result)   Collection Time: 05/13/21  7:52 PM   Specimen: BLOOD  Result Value Ref Range Status   Specimen Description BLOOD RIGHT HAND  Final   Special Requests   Final    BOTTLES DRAWN AEROBIC ONLY  Blood Culture results may not be optimal due to an inadequate volume of blood received in culture bottles   Culture   Final    NO GROWTH 3 DAYS Performed at [facility], [address], [place], [place] [address]    Report Status PENDING  Incomplete  Blood culture (routine x 2)     Status: None (Preliminary result)   Collection Time: 05/13/21  7:57 PM   Specimen: BLOOD  Result Value Ref Range Status   Specimen Description BLOOD RIGHT ASSIST CONTROL  Final   Special Requests   Final    BOTTLES DRAWN AEROBIC AND ANAEROBIC Blood Culture adequate volume   Culture   Final    NO GROWTH 3 DAYS Performed at [facility], [address], [place], [place] [address]    Report Status PENDING  Incomplete     Discharge Instructions:   Discharge Instructions     Ambulatory referral to Physical Therapy   Complete by: As directed    Diet - low sodium heart healthy   Complete by: As directed    Discharge wound care:   Complete by: As directed    Cover wound with Mepilex border.  Alternate positions while sitting or lying down to avoid prolonged pressure on the wound.   Increase activity slowly   Complete by: As directed       Allergies as of 05/16/2021       Reactions   Cetirizine Hives   Diazepam Other (See Comments)   Depression   Baclofen Anxiety, Other (See Comments)   AMS - "Really messed me up, caused me to drop things"        Medication List     TAKE these medications    Adbry 150 MG/ML Sosy Generic drug: Tralokinumab-ldrm Inject into the skin.   albuterol 108 (90 Base) MCG/ACT inhaler Commonly known as: VENTOLIN HFA Inhale 2 puffs into the lungs every 4 (four) hours as needed for shortness of breath or wheezing.   amitriptyline 25 MG tablet Commonly known as: ELAVIL Take 50 mg by mouth at bedtime.   Brimonidine Tartrate 0.025 % Soln Place 1 drop into both eyes daily.   doxepin 50 MG capsule Commonly known as: SINEQUAN Take 50 mg by  mouth at bedtime.   Dulcolax 5 MG EC tablet Generic drug: bisacodyl Take 2 tablets (10 mg total) by mouth at bedtime  as needed for moderate constipation.   DULoxetine 20 MG capsule Commonly known as: CYMBALTA Take 40 mg by mouth in the morning.   DUPIXENT [place] Inject 300 mg into the skin every 14 (fourteen) days. (Fridays)   famotidine 40 MG tablet Commonly known as: PEPCID Take 40 mg by mouth at bedtime.   fexofenadine 180 MG tablet Commonly known as: ALLEGRA Take 180 mg by mouth daily as needed for allergies.   FIBER PO Take 1 capsule by mouth daily.   fluocinonide cream 0.05 % Commonly known as: LIDEX Apply 1 application topically 2 (two) times daily as needed (skin irritations).   fluticasone 50 MCG/ACT nasal spray Commonly known as: FLONASE Place 1-2 sprays into both nostrils 2 (two) times daily as needed for allergies or rhinitis.   Fluticasone-Salmeterol 250-50 MCG/DOSE Aepb Commonly known as: ADVAIR Inhale 1 puff into the lungs 2 (two) times daily.   gabapentin 300 MG capsule Commonly known as: NEURONTIN Take 1 capsule (300 mg total) by mouth 2 (two) times daily.   leflunomide 20 MG tablet Commonly known as: ARAVA Take 20 mg by mouth daily.   medroxyPROGESTERone 150 MG/ML injection Commonly known as: DEPO-PROVERA Inject 150 mg into the muscle every 3 (three) months.   melatonin 3 MG Tabs tablet Take 2 tablets (6 mg total) by mouth at bedtime.   metoCLOPramide 10 MG tablet Commonly known as: REGLAN Take 10 mg by mouth in the morning and at bedtime.   oxybutynin 10 MG 24 hr tablet Commonly known as: DITROPAN-XL Take 10 mg by mouth daily in the afternoon.   pentosan polysulfate 100 MG capsule Commonly known as: ELMIRON Take 100 mg by mouth at bedtime.   Plecanatide 3 MG Tabs Take 3 mg by mouth daily at 6 (six) AM.   potassium chloride SA 20 MEQ tablet Commonly known as: KLOR-CON M Take 1 tablet (20 mEq total) by mouth 2 (two) times daily.    promethazine 25 MG tablet Commonly known as: PHENERGAN Take 25 mg by mouth every 8 (eight) hours as needed for nausea or vomiting.   RABEprazole 20 MG tablet Commonly known as: ACIPHEX Take 20 mg by mouth in the morning and at bedtime.   sodium citrate-citric acid 500-334 MG/5ML solution Commonly known as: ORACIT Take 15 mLs by mouth in the morning and at bedtime.   tamsulosin 0.4 MG Caps capsule Commonly known as: FLOMAX Take 0.4 mg by mouth at bedtime.   theophylline 400 MG 24 hr tablet Commonly known as: UNIPHYL Take 400 mg by mouth 2 (two) times daily.   thiamine 50 MG tablet Commonly known as: VITAMIN B-1 Take 50 mg by mouth daily.   traMADol 50 MG tablet Commonly known as: ULTRAM Take 50 mg by mouth every 6 (six) hours as needed for moderate pain.   Uribel 118 MG Caps Take 1 capsule by mouth 3 (three) times daily as needed (Urinary symptoms).               Discharge Care Instructions  (From admission, onward)           Start     Ordered   05/16/21 0000  Discharge wound care:       Comments: Cover wound with Mepilex border.  Alternate positions while sitting or lying down to avoid prolonged pressure on the wound.   05/16/21 1548               If you experience worsening of your admission symptoms, develop shortness of  breath, life threatening emergency, suicidal or homicidal thoughts you must seek medical attention immediately by calling 911 or calling your MD immediately  if symptoms less severe.   You must read complete instructions/literature along with all the possible adverse reactions/side effects for all the medicines you take and that have been prescribed to you. Take any new medicines after you have completely understood and accept all the possible adverse reactions/side effects.    Please note   You were cared for by a hospitalist during your hospital stay. If you have any questions about your discharge medications or the care you  received while you were in the hospital after you are discharged, you can call the unit and asked to speak with the hospitalist on call if the hospitalist that took care of you is not available. Once you are discharged, your primary care physician will handle any further medical issues. Please note that NO REFILLS for any discharge medications will be authorized once you are discharged, as it is imperative that you return to your primary care physician (or establish a relationship with a primary care physician if you do not have one) for your aftercare needs so that they can reassess your need for medications and monitor your lab values.       Time coordinating discharge: 31 minutes  Signed:  Aarya Robinson  [facility] 05/16/2021, 3:48 PM   Pager on [email or web address]. If 7PM-7AM, please contact night-coverage at [email or web address]

## 2021-05-16 NOTE — Evaluation (Signed)
Physical Therapy Evaluation Patient Details Name: Katherine Perez MRN: [id number] DOB: Mar 12, 1971 Today's Date: 05/16/2021  History of Present Illness  Katherine Perez Katherine Perez is a 51 y.o. female with medical history significant for Eating disorder, chronic constipation of multifactorial etiology on multiple prescription laxatives, primary gastroparesis, rheumatoid arthritis, CKD4 , anxiety, neuropathy, history of biliary stenting, brought into the ED with lethargy, forgetfulness, altered mental status, similar to prior hospitalizations for electrolyte derangement and metabolic acidosis.   Clinical Impression  Patient received in bed, she is agreeable to PT assessment. Nervous about walking as she has had a lot of loose stools today per her report. Patient is independent with bed mobility. Transfers with min guard from slightly elevated bed. Ambulated 10 feet then requested to go back to bed. Reports mild dizziness with mobility. Upon returning to bed she requests to try walking again as she wants to go home today. Patient was able to walk 25 feet with RW and min guard on second attempt, declined further mobility. She will have 24 hour assist at home from husband. She will benefit from skilled PT while here to improve balance, safety and activity tolerance.           Recommendations for follow up therapy are one component of a multi-disciplinary discharge planning process, led by the attending physician.  Recommendations may be updated based on patient status, additional functional criteria and insurance authorization.  Follow Up Recommendations Outpatient PT    Assistance Recommended at Discharge Intermittent Supervision/Assistance  Patient can return home with the following  A little help with walking and/or transfers;A little help with bathing/dressing/bathroom;Help with stairs or ramp for entrance;Assist for transportation;Assistance with cooking/housework    Equipment  Recommendations None recommended by PT  Recommendations for Other Services       Functional Status Assessment       Precautions / Restrictions Precautions Precautions: Fall Restrictions Weight Bearing Restrictions: No      Mobility  Bed Mobility Overal bed mobility: Independent                  Transfers Overall transfer level: Needs assistance Equipment used: Rolling walker (2 wheels) Transfers: Sit to/from Stand Sit to Stand: Min guard                Ambulation/Gait Ambulation/Gait assistance: Min guard Gait Distance (Feet): 25 Feet Assistive device: Rolling walker (2 wheels) Gait Pattern/deviations: Step-through pattern, Decreased step length - right, Decreased step length - left, Knee hyperextension - right, Knee hyperextension - left, Decreased stride length Gait velocity: decr     General Gait Details: patient ambulated 10 feet initially, then another 25 feet with RW and min guard. Reports mild dizziness with mobility, slight nausea.  Stairs            Wheelchair Mobility    Modified Rankin (Stroke Patients Only)       Balance Overall balance assessment: Needs assistance Sitting-balance support: Feet supported Sitting balance-Leahy Scale: Good     Standing balance support: Bilateral upper extremity supported, During functional activity, Reliant on assistive device for balance Standing balance-Leahy Scale: Fair Standing balance comment: reliant on B UE support, min guard for safety                             Pertinent Vitals/Pain Pain Assessment Pain Assessment: No/denies pain    Home Living Family/patient expects to be discharged to:: Private residence Living Arrangements: Spouse/significant other  Available Help at Discharge: Family;Available 24 hours/day Type of Home: House Home Access: Stairs to enter Entrance Stairs-Rails: None Entrance Stairs-Number of Steps: 2   Home Layout: One level Home Equipment:  Conservation officer, nature (2 wheels);Shower seat      Prior Function Prior Level of Function : Needs assist;Driving       Physical Assist : Mobility (physical) Mobility (physical): Bed mobility;Transfers;Gait   Mobility Comments: patient uses walker at baseline. States she drives. History of falls.       Hand Dominance   Dominant Hand: Right    Extremity/Trunk Assessment   Upper Extremity Assessment Upper Extremity Assessment: Overall WFL for tasks assessed    Lower Extremity Assessment Lower Extremity Assessment: Generalized weakness    Cervical / Trunk Assessment Cervical / Trunk Assessment: Normal  Communication   Communication: No difficulties  Cognition Arousal/Alertness: Awake/alert Behavior During Therapy: WFL for tasks assessed/performed Overall Cognitive Status: Within Functional Limits for tasks assessed                                          General Comments      Exercises     Assessment/Plan    PT Assessment Patient needs continued PT services  PT Problem List Decreased strength;Decreased mobility;Decreased activity tolerance;Decreased balance;Decreased coordination       PT Treatment Interventions Therapeutic activities;Gait training;Therapeutic exercise;Patient/family education;Stair training;Functional mobility training    PT Goals (Current goals can be found in the Care Plan section)  Acute Rehab PT Goals Patient Stated Goal: to go home today PT Goal Formulation: With patient Time For Goal Achievement: 05/23/21 Potential to Achieve Goals: Good    Frequency Min 2X/week     Co-evaluation               AM-PAC PT "6 Clicks" Mobility  Outcome Measure Help needed turning from your back to your side while in a flat bed without using bedrails?: None Help needed moving from lying on your back to sitting on the side of a flat bed without using bedrails?: None Help needed moving to and from a bed to a chair (including a  wheelchair)?: A Little Help needed standing up from a chair using your arms (e.g., wheelchair or bedside chair)?: A Little Help needed to walk in hospital room?: A Little Help needed climbing 3-5 steps with a railing? : A Lot 6 Click Score: 19    End of Session Equipment Utilized During Treatment: Gait belt Activity Tolerance: Patient limited by fatigue Patient left: in bed;with call bell/phone within reach;with bed alarm set Nurse Communication: Mobility status PT Visit Diagnosis: Muscle weakness (generalized) (M62.81);Difficulty in walking, not elsewhere classified (R26.2);Repeated falls (R29.6)    Time: 1428-1500 PT Time Calculation (min) (ACUTE ONLY): 32 min   Charges:   PT Evaluation $PT Eval Moderate Complexity: 1 Mod PT Treatments $Gait Training: 8-22 mins        Sean Malinowski, PT, GCS 05/16/21,3:10 PM

## 2021-05-16 NOTE — TOC Initial Note (Signed)
Transition of Care [facility]) - Initial/Assessment Note    Patient Details  Name: Katherine Perez MRN: [id number] Date of Birth: 10/20/1970  [facility]) CM/SW Contact:    Alberteen Sam, LCSW Phone Number: 05/16/2021, 4:26 PM  Clinical Narrative:                  CSW spoke with patient to confirm PCP is still Dr. Tressia Miners, has walker at home. CSW informed of outpatient PT recs, she reports she will decline for now as she'd just like to stay home for a while to rest before thinking about outpatient PT. She is aware that she can contact her PCP should she decide to enroll in outpatient PT in the near future. She reports her husband is picking her up.   No further discharge needs identified at this time.    Expected Discharge Plan: Home/Self Care Barriers to Discharge: No Barriers Identified   Patient Goals and CMS Choice Patient states their goals for this hospitalization and ongoing recovery are:: to go home CMS Medicare.gov Compare Post Acute Care list provided to:: Patient Choice offered to / list presented to : Patient  Expected Discharge Plan and Services Expected Discharge Plan: Home/Self Care       Living arrangements for the past 2 months: Single Family Home Expected Discharge Date: 05/16/21                                    Prior Living Arrangements/Services Living arrangements for the past 2 months: Single Family Home Lives with:: Spouse                   Activities of Daily Living Home Assistive Devices/Equipment: Environmental consultant (specify type) ADL Screening (condition at time of admission) Patient's cognitive ability adequate to safely complete daily activities?: Yes Is the patient deaf or have difficulty hearing?: No Does the patient have difficulty seeing, even when wearing glasses/contacts?: No Does the patient have difficulty concentrating, remembering, or making decisions?: Yes Patient able to express need for assistance with  ADLs?: No Does the patient have difficulty dressing or bathing?: No Independently performs ADLs?: Yes (appropriate for developmental age) Does the patient have difficulty walking or climbing stairs?: Yes Weakness of Legs: Both Weakness of Arms/Hands: None  Permission Sought/Granted                  Emotional Assessment       Orientation: : Oriented to Self, Oriented to Place, Oriented to  Time, Oriented to Situation Alcohol / Substance Use: Not Applicable Psych Involvement: No (comment)  Admission diagnosis:  Metabolic encephalopathy [Q75.91] Metabolic acidosis [M38.46] Severe dehydration [E86.0] AKI (acute kidney injury) ([place]) [K59.9] Acute metabolic encephalopathy [J57.01] Patient Active Problem List   Diagnosis Date Noted   Decubitus ulcer of coccyx, stage 2 ([place]) 05/15/2021   Neuropathy    Chronic constipation    Rheumatoid arthritis involving multiple sites with positive rheumatoid factor ([place]) 10/27/2020   Encounter for adjustment or removal of myringotomy device (stent) (tube)    IDA (iron deficiency anemia) 10/07/2020   AKI (acute kidney injury) ([place]) 77/93/9030   Metabolic acidosis 01/13/3006   Other specified diseases of biliary tract    Disease of biliary tract, unspecified    Bile leak 06/30/2020   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   Hyperkalemia 06/27/2020   CKD (chronic kidney disease), stage IV ([place]) 06/27/2020   Acute pancreatitis  without infection or necrosis 04/23/2020   GERD without esophagitis 04/23/2020   Hypokalemia 04/23/2020   Prolonged QT interval 04/23/2020   Hypercalcemia    Pressure injury of skin 10/27/2019   Hyponatremia 10/26/2019   Increased anion gap metabolic acidosis 74/82/7078   Acute renal failure superimposed on stage 4 chronic kidney disease ([place]) 67/54/4920   Acute metabolic encephalopathy 01/27/1218   Leukocytosis 10/26/2019   Slow transit constipation 09/03/2018   Laxative habit 12/31/2014   Chronic interstitial  cystitis 11/18/2014   PCP:  Gladstone Lighter, MD Pharmacy:   [facility] [id number], [place] [address] Phone: [phone] Fax: [phone]     Social Determinants of Health ([place]) Interventions    Readmission Risk Interventions No flowsheet data found.

## 2021-05-16 NOTE — Progress Notes (Signed)
[facility], [place] 05/16/21  Subjective:   LOS: 3 01/23 0701 - 01/24 0700 In: 0  Out: 950 [Urine:950]  Patient seen resting quietly Alert and oriented Continues to have poor appetite Denies shortness of breath or discomfort of any kind  Creatinine improved to 1.9 Urine output of 950 mL recorded overnight.   Objective:  Vital signs in last 24 hours:  Temp:  [97.8 F (36.6 C)-98.4 F (36.9 C)] 97.8 F (36.6 C) (01/24 1159) Pulse Rate:  [73-84] 83 (01/24 1159) Resp:  [16-20] 20 (01/24 1159) BP: (107-141)/(64-76) 109/64 (01/24 1159) SpO2:  [95 %-96 %] 95 % (01/24 1159)  Weight change:  Filed Weights   05/14/21 1939  Weight: 44.7 kg    Intake/Output:    Intake/Output Summary (Last 24 hours) at 05/16/2021 1457 Last data filed at 05/16/2021 0945 Gross per 24 hour  Intake 480 ml  Output 950 ml  Net -470 ml      Physical Exam: General: Thin, cachectic, laying in the bed  HEENT Oral mucous membranes are dry  Pulm/lungs Normal breathing effort on room air, no crackles, Lt wheeze  CVS/Heart Regular, no rub  Abdomen:  Soft, nontender  Extremities: No peripheral edema  Neurologic: Alert, able to answer simple questions but slow to respond  Skin: Decreased turgor       Basic Metabolic Panel:  Recent Labs  Lab 05/13/21 1437 05/13/21 1952 05/13/21 2358 05/14/21 1044 05/15/21 0536 05/16/21 0640  NA 138  --  139 142 142 135  K 3.8  --  3.7 2.9* 2.3* 4.5  CL 115*  --  119* 118* 115* 106  CO2 <7*  --  <7* 11* 17* 22  GLUCOSE 73  --  90 96 108* 127*  BUN 68*  --  67* 61* 51* 19  CREATININE 2.45*  --  2.36* 2.25* 1.91* 0.91  CALCIUM 8.8*  --  8.6* 8.4* 7.8* 9.0  MG  --  2.0  --   --  1.6* 1.8  PHOS  --  7.0*  --   --   --  3.6      CBC: Recent Labs  Lab 05/13/21 1437 05/13/21 2358 05/15/21 0536 05/16/21 0711  WBC 14.6* 14.2* 10.9* 9.0  NEUTROABS  --   --  7.1 4.3  HGB 10.2* 9.4* 8.8* 8.0*  HCT 34.1* 31.5* 27.3* 25.5*   MCV 101.8* 101.0* 95.5 96.2  PLT 425* 400 348 338      No results found for: HEPBSAG, HEPBSAB, HEPBIGM    Microbiology:  Recent Results (from the past 240 hour(s))  Resp Panel by RT-PCR (Flu A&B, Covid) Nasopharyngeal Swab     Status: None   Collection Time: 05/13/21  7:33 PM   Specimen: Nasopharyngeal Swab; Nasopharyngeal(NP) swabs in vial transport medium  Result Value Ref Range Status   SARS Coronavirus 2 by RT PCR NEGATIVE NEGATIVE Final    Comment: (NOTE) SARS-CoV-2 target nucleic acids are NOT DETECTED.  The SARS-CoV-2 RNA is generally detectable in upper respiratory specimens during the acute phase of infection. The lowest concentration of SARS-CoV-2 viral copies this assay can detect is 138 copies/mL. A negative result does not preclude SARS-Cov-2 infection and should not be used as the sole basis for treatment or other patient management decisions. A negative result may occur with  improper specimen collection/handling, submission of specimen other than nasopharyngeal swab, presence of viral mutation(s) within the areas targeted by this assay, and inadequate number of viral copies(<138 copies/mL). A  negative result must be combined with clinical observations, patient history, and epidemiological information. The expected result is Negative.  Fact Sheet for Patients:  EntrepreneurPulse.com.au  Fact Sheet for Healthcare Providers:  IncredibleEmployment.be  This test is no t yet approved or cleared by the [place] FDA and  has been authorized for detection and/or diagnosis of SARS-CoV-2 by FDA under an Emergency Use Authorization (EUA). This EUA will remain  in effect (meaning this test can be used) for the duration of the COVID-19 declaration under Section 564(b)(1) of the Act, 21 U.S.C.section 360bbb-3(b)(1), unless the authorization is terminated  or revoked sooner.       Influenza A by PCR NEGATIVE NEGATIVE Final    Influenza B by PCR NEGATIVE NEGATIVE Final    Comment: (NOTE) The Xpert Xpress SARS-CoV-2/FLU/RSV plus assay is intended as an aid in the diagnosis of influenza from Nasopharyngeal swab specimens and should not be used as a sole basis for treatment. Nasal washings and aspirates are unacceptable for Xpert Xpress SARS-CoV-2/FLU/RSV testing.  Fact Sheet for Patients: EntrepreneurPulse.com.au  Fact Sheet for Healthcare Providers: IncredibleEmployment.be  This test is not yet approved or cleared by the [place] FDA and has been authorized for detection and/or diagnosis of SARS-CoV-2 by FDA under an Emergency Use Authorization (EUA). This EUA will remain in effect (meaning this test can be used) for the duration of the COVID-19 declaration under Section 564(b)(1) of the Act, 21 U.S.C. section 360bbb-3(b)(1), unless the authorization is terminated or revoked.  Performed at [facility], [address], [place] [address]   Blood culture (routine x 2)     Status: None (Preliminary result)   Collection Time: 05/13/21  7:52 PM   Specimen: BLOOD  Result Value Ref Range Status   Specimen Description BLOOD RIGHT HAND  Final   Special Requests   Final    BOTTLES DRAWN AEROBIC ONLY Blood Culture results may not be optimal due to an inadequate volume of blood received in culture bottles   Culture   Final    NO GROWTH 3 DAYS Performed at [facility], [address], [place], [place] [address]    Report Status PENDING  Incomplete  Blood culture (routine x 2)     Status: None (Preliminary result)   Collection Time: 05/13/21  7:57 PM   Specimen: BLOOD  Result Value Ref Range Status   Specimen Description BLOOD RIGHT ASSIST CONTROL  Final   Special Requests   Final    BOTTLES DRAWN AEROBIC AND ANAEROBIC Blood Culture adequate volume   Culture   Final    NO GROWTH 3 DAYS Performed at [facility], [address], [place],  [address]    Report Status PENDING  Incomplete    Coagulation Studies: No results for input(s): LABPROT, INR in the last 72 hours.  Urinalysis: No results for input(s): COLORURINE, LABSPEC, PHURINE, GLUCOSEU, HGBUR, BILIRUBINUR, KETONESUR, PROTEINUR, UROBILINOGEN, NITRITE, LEUKOCYTESUR in the last 72 hours.  Invalid input(s): APPERANCEUR    Imaging: No results found.   Medications:    sodium bicarbonate 150 mEq in D5W infusion 100 mL/hr at 05/16/21 1138    amitriptyline  50 mg Oral QHS   doxepin  50 mg Oral QHS   DULoxetine  40 mg Oral Daily   enoxaparin (LOVENOX) injection  30 mg Subcutaneous Q24H   fluticasone furoate-vilanterol  1 puff Inhalation Daily   gabapentin  300 mg Oral Daily   leflunomide  20 mg Oral Daily  acetaminophen **OR** acetaminophen, albuterol, bisacodyl, prochlorperazine  Assessment/ Plan:  51 y.o. 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, pelvic floor dysfunction, GERD, gastroparesis, iron deficiency anemia, malnutrition, alcohol abuse, tobacco abuse, anemia, asthma, chronic kidney disease stage IIIa, laxative abuse, recurrent UTIs, interstitial cystitis, Raynaud's disease, CKD  Principal Problem:   Metabolic acidosis Active Problems:   Acute renal failure superimposed on stage 4 chronic kidney disease (HCC)   Acute metabolic encephalopathy   Neuropathy   Chronic interstitial cystitis   Laxative habit   Rheumatoid arthritis involving multiple sites with positive rheumatoid factor (HCC)   Chronic constipation   Decubitus ulcer of coccyx, stage 2 ([place])   #. AKI on CKD st 3a with proteinuria #Severe dehydration #Hypokalemia Recent Labs    05/13/21 2358 05/14/21 1044 05/15/21 0536 05/16/21 0640  CREATININE 2.36* 2.25* 1.91* 0.91   GFR 25 Baseline Creatinine 1.39/GFR 47 from 03/21/21 AKI likely secondary to dehydration/volume depletion Renal function has returned to normal Potassium currently 5.5   Will schedule discharge in 1 to 2 weeks with our office.  #. Anemia of CKD  Lab Results  Component Value Date   HGB 8.0 (L) 05/16/2021   Will continue to monitor  #. Acute Severe Metabolic acidosis, mixed with lactic acidosis H/o laxative abuse IV fluids stopped.  Patient encouraged to improve oral intake with nutritional supplements between meals.      LOS: [place] 1/24/20232:57 PM  [place], [place]

## 2021-05-16 NOTE — Progress Notes (Signed)
Reviewed d/c instructions with pt and spouse, No distress or complains noted, PIV d/c and pressure applied. Pt able to ambulate with walked.

## 2021-05-18 LAB — CULTURE, BLOOD (ROUTINE X 2)
Culture: NO GROWTH
Culture: NO GROWTH
Special Requests: ADEQUATE

## 2021-07-10 ENCOUNTER — Inpatient Hospital Stay: Payer: BC Managed Care – PPO | Attending: Oncology | Admitting: Oncology

## 2021-07-10 ENCOUNTER — Other Ambulatory Visit: Payer: Self-pay

## 2021-07-10 ENCOUNTER — Encounter: Payer: Self-pay | Admitting: Oncology

## 2021-07-10 VITALS — BP 111/75 | HR 81 | Temp 96.6°F

## 2021-07-10 DIAGNOSIS — N184 Chronic kidney disease, stage 4 (severe): Secondary | ICD-10-CM | POA: Insufficient documentation

## 2021-07-10 DIAGNOSIS — D509 Iron deficiency anemia, unspecified: Secondary | ICD-10-CM | POA: Diagnosis not present

## 2021-07-10 DIAGNOSIS — D631 Anemia in chronic kidney disease: Secondary | ICD-10-CM | POA: Insufficient documentation

## 2021-07-10 DIAGNOSIS — Z79899 Other long term (current) drug therapy: Secondary | ICD-10-CM | POA: Diagnosis not present

## 2021-07-10 NOTE — Progress Notes (Signed)
?Hematology/Oncology Progress note ?[phone] [phone] ?  ? ? ? ?Patient Care Team: ?Gladstone Lighter, MD as PCP - General (Internal Medicine) ? ?REFERRING PROVIDER: ?Gladstone Lighter, MD ? ?CHIEF COMPLAINTS/REASON FOR VISIT:  ?Follow up for anemia ? ?HISTORY OF PRESENTING ILLNESS:  ?Katherine Perez is a  51 y.o.  female with PMH listed below who was referred to me for evaluation of anemia ?Reviewed patient's recent labs that was don at  her nephrologist office ?08/16/2020 labs revealed anemia with hemoglobin of 9.2, MCV 93.8, WBC 11.8, platelet count 410.   ?Reviewed patient's previous labs ordered by primary care physician's office, anemia is chronic onset , duration is since July 2021.  No aggravating or improving factors. ? ?Associated signs and symptoms: Patient reports fatigue.  Denies SOB with exertion.  ?Denies weight loss, easy bruising, hematochezia, hemoptysis, hematuria. ?Patient denies black or bloody bowel movement. ? ?Patient has chronic kidney disease stage III and follows up with Dr. Holley Raring.  Chronic kidney disease was felt to be secondary to dehydration and sepsis. ? ?08/08/2020-08/12/2020, patient was admitted because of acute gastroenteritis, dehydration. ? 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  Patient is on Cymbalta. ?Patient smokes cigarettes and also use to drink alcohol, she does not use alcohol any more ? ?INTERVAL HISTORY ?Katherine Perez is a 51 y.o. female who has above history reviewed by me today presents for follow up visit for anemia,  ?She prefers to have blood work done tomorrow with her PCP. ?05/13/2021 - 05/16/2021, patient was admitted due to acute renal failure superimposed on chronic kidney disease stage IV.  Acute metabolic encephalopathy.   ?During that admission, hemoglobin was decreased at 8. ?Today patient feels well at baseline.  She follows up with nephrology every 3 months.  She has  appointment with primary care provider tomorrow. ? ? ?Review of Systems  ?Constitutional:  Positive for fatigue. Negative for appetite change, chills and fever.  ?HENT:   Negative for hearing loss and voice change.   ?Eyes:  Negative for eye problems.  ?Respiratory:  Negative for chest tightness and cough.   ?Cardiovascular:  Negative for chest pain.  ?Gastrointestinal:  Negative for abdominal distention, abdominal pain and blood in stool.  ?Endocrine: Negative for hot flashes.  ?Genitourinary:  Negative for difficulty urinating and frequency.   ?Musculoskeletal:  Negative for arthralgias.  ?Skin:  Negative for itching and rash.  ?Neurological:  Positive for numbness. Negative for extremity weakness.  ?Hematological:  Negative for adenopathy.  ?Psychiatric/Behavioral:  Negative for confusion.   ? ? ?MEDICAL HISTORY:  ?Past Medical History:  ?Diagnosis Date  ? Anemia   ? Asthma   ? CKD (chronic kidney disease)   ? ETOH abuse   ? GERD (gastroesophageal reflux disease)   ? IC (interstitial cystitis)   ? IDA (iron deficiency anemia) 10/07/2020  ? Laxative abuse   ? Neuropathy   ? Pneumonia   ? Raynaud disease   ? ? ?SURGICAL HISTORY: ?Past Surgical History:  ?Procedure Laterality Date  ? COLONOSCOPY    ? ERCP N/A 07/01/2020  ? Procedure: ENDOSCOPIC RETROGRADE CHOLANGIOPANCREATOGRAPHY (ERCP);  Surgeon: Lucilla Lame, MD;  Location: [facility] ENDOSCOPY;  Service: Endoscopy;  Laterality: N/A;  ? ERCP N/A 10/18/2020  ? Procedure: ENDOSCOPIC RETROGRADE CHOLANGIOPANCREATOGRAPHY (ERCP) STENT REMOVAL;  Surgeon: Lucilla Lame, MD;  Location: [facility] ENDOSCOPY;  Service: Endoscopy;  Laterality: N/A;  ? ESOPHAGOGASTRODUODENOSCOPY    ? FRACTURE SURGERY    ? femur fracture left -  2019  ? LEG SURGERY    ? ? ?SOCIAL HISTORY: ?Social History  ? ?Socioeconomic History  ? Marital status: Married  ?  Spouse name: Not on file  ? Number of children: Not on file  ? Years of education: Not on file  ? Highest education level: Not on file  ?Occupational  History  ? Not on file  ?Tobacco Use  ? Smoking status: Every Day  ?  Packs/day: 1.00  ?  Types: Cigarettes  ? Smokeless tobacco: Never  ?Vaping Use  ? Vaping Use: Some days  ?Substance and Sexual Activity  ? Alcohol use: Yes  ?  Comment: last drink 03/2020  ? Drug use: Yes  ?  Types: Marijuana  ? Sexual activity: Not on file  ?Other Topics Concern  ? Not on file  ?Social History Narrative  ? Not on file  ? ?Social Determinants of Health  ? ?Financial Resource Strain: Not on file  ?Food Insecurity: Not on file  ?Transportation Needs: Not on file  ?Physical Activity: Not on file  ?Stress: Not on file  ?Social Connections: Not on file  ?Intimate Partner Violence: Not on file  ? ? ?FAMILY HISTORY: ?Family History  ?Problem Relation Age of Onset  ? Other Neg Hx   ? ? ?ALLERGIES:  is allergic to cetirizine, diazepam, and baclofen. ? ?MEDICATIONS:  ?Current Outpatient Medications  ?Medication Sig Dispense Refill  ? ADBRY 150 MG/ML SOSY Inject into the skin.    ? albuterol (VENTOLIN HFA) 108 (90 Base) MCG/ACT inhaler Inhale 2 puffs into the lungs every 4 (four) hours as needed for shortness of breath or wheezing.    ? amitriptyline (ELAVIL) 25 MG tablet Take 50 mg by mouth at bedtime.    ? Brimonidine Tartrate 0.025 % SOLN Place 1 drop into both eyes daily.    ? doxepin (SINEQUAN) 50 MG capsule Take 50 mg by mouth at bedtime.    ? DULoxetine (CYMBALTA) 20 MG capsule Take 40 mg by mouth in the morning.    ? Dupilumab (DUPIXENT [place]) Inject 300 mg into the skin every 14 (fourteen) days. (Fridays)    ? famotidine (PEPCID) 40 MG tablet Take 40 mg by mouth at bedtime.    ? fexofenadine (ALLEGRA) 180 MG tablet Take 180 mg by mouth daily as needed for allergies.    ? FIBER PO Take 1 capsule by mouth daily.    ? fluocinonide cream (LIDEX) 2.77 % Apply 1 application topically 2 (two) times daily as needed (skin irritations).    ? fluticasone (FLONASE) 50 MCG/ACT nasal spray Place 1-2 sprays into both nostrils 2 (two) times daily as  needed for allergies or rhinitis.    ? Fluticasone-Salmeterol (ADVAIR) 250-50 MCG/DOSE AEPB Inhale 1 puff into the lungs 2 (two) times daily.    ? gabapentin (NEURONTIN) 300 MG capsule Take 1 capsule (300 mg total) by mouth 2 (two) times daily. 15 capsule 0  ? leflunomide (ARAVA) 20 MG tablet Take 20 mg by mouth daily.    ? medroxyPROGESTERone (DEPO-PROVERA) 150 MG/ML injection Inject 150 mg into the muscle every 3 (three) months.    ? melatonin 3 MG TABS tablet Take 2 tablets (6 mg total) by mouth at bedtime. 60 tablet 0  ? Meth-Hyo-M Bl-Na Phos-Ph Sal (URIBEL) 118 MG CAPS Take 1 capsule by mouth 3 (three) times daily as needed (Urinary symptoms).    ? metoCLOPramide (REGLAN) 10 MG tablet Take 10 mg by mouth in the morning and at bedtime.    ?  oxybutynin (DITROPAN-XL) 10 MG 24 hr tablet Take 10 mg by mouth daily in the afternoon.    ? pentosan polysulfate (ELMIRON) 100 MG capsule Take 100 mg by mouth at bedtime.    ? Plecanatide 3 MG TABS Take 3 mg by mouth daily at 6 (six) AM.     ? potassium chloride SA (KLOR-CON) 20 MEQ tablet Take 1 tablet (20 mEq total) by mouth 2 (two) times daily. 60 tablet 1  ? promethazine (PHENERGAN) 25 MG tablet Take 25 mg by mouth every 8 (eight) hours as needed for nausea or vomiting.     ? RABEprazole (ACIPHEX) 20 MG tablet Take 20 mg by mouth in the morning and at bedtime.    ? sodium citrate-citric acid (ORACIT) 500-334 MG/5ML solution Take 15 mLs by mouth in the morning and at bedtime.    ? tamsulosin (FLOMAX) 0.4 MG CAPS capsule Take 0.4 mg by mouth at bedtime.    ? theophylline (UNIPHYL) 400 MG 24 hr tablet Take 400 mg by mouth 2 (two) times daily.     ? thiamine (VITAMIN B-1) 50 MG tablet Take 50 mg by mouth daily.    ? traMADol (ULTRAM) 50 MG tablet Take 50 mg by mouth every 6 (six) hours as needed for moderate pain.    ? ?No current facility-administered medications for this visit.  ? ? ? ?PHYSICAL EXAMINATION: ?ECOG PERFORMANCE STATUS: 1 - Symptomatic but completely  ambulatory ?Vitals:  ? 07/10/21 1258  ?BP: 111/75  ?Pulse: 81  ?Temp: (!) 96.6 ?F (35.9 ?C)  ? ?Filed Weights  ? ? ? ?Physical Exam ?Constitutional:   ?   General: She is not in acute distress. ?   Comments: Patient

## 2021-07-13 ENCOUNTER — Encounter: Payer: Self-pay | Admitting: Internal Medicine

## 2021-07-17 ENCOUNTER — Telehealth: Payer: Self-pay

## 2021-07-17 DIAGNOSIS — D631 Anemia in chronic kidney disease: Secondary | ICD-10-CM

## 2021-07-17 NOTE — Telephone Encounter (Signed)
Called and left detail message of lab results and Dr. Collie Siad recommendation to follow up in 1 year, labs prior.  ? ? ?Please schedule patient for: ?Labs in 1 year ?MD 1-2 days after. ?Please notify patient of appt. Thanks.  ?

## 2021-07-17 NOTE — Telephone Encounter (Signed)
-----   Message from Earlie Server, MD sent at 07/15/2021  4:25 PM EDT ----- ?Please let her know that her red blood cell count has normalized.  ?Iron level is within normal limits.  ?Recommend her to follow up in 1 year. Please order iron labs prior to MD visit. Thanks.  ?

## 2021-07-20 ENCOUNTER — Other Ambulatory Visit
Admission: RE | Admit: 2021-07-20 | Discharge: 2021-07-20 | Disposition: A | Discharge status: Home / Self Care | Payer: BC Managed Care – PPO | Source: Ambulatory Visit | Attending: Rheumatology | Admitting: Rheumatology

## 2021-07-20 DIAGNOSIS — M1711 Unilateral primary osteoarthritis, right knee: Secondary | ICD-10-CM | POA: Insufficient documentation

## 2021-07-20 LAB — SYNOVIAL CELL COUNT + DIFF, W/ CRYSTALS
Crystals, Fluid: NONE SEEN
Eosinophils-Synovial: 0 %
Lymphocytes-Synovial Fld: 6 %
Monocyte-Macrophage-Synovial Fluid: 7 %
Neutrophil, Synovial: 87 %
WBC, Synovial: 1071 /mm3 — ABNORMAL HIGH (ref 0–200)

## 2021-07-23 LAB — BODY FLUID CULTURE W GRAM STAIN: Culture: NO GROWTH

## 2021-09-11 ENCOUNTER — Inpatient Hospital Stay
Admission: EM | Admit: 2021-09-11 | Discharge: 2021-09-15 | DRG: 640 | Disposition: A | Discharge status: Home / Self Care | Payer: BC Managed Care – PPO | Attending: Obstetrics and Gynecology | Admitting: Obstetrics and Gynecology

## 2021-09-11 ENCOUNTER — Emergency Department: Payer: BC Managed Care – PPO

## 2021-09-11 ENCOUNTER — Other Ambulatory Visit: Payer: Self-pay

## 2021-09-11 DIAGNOSIS — Z20822 Contact with and (suspected) exposure to covid-19: Secondary | ICD-10-CM | POA: Diagnosis present

## 2021-09-11 DIAGNOSIS — K219 Gastro-esophageal reflux disease without esophagitis: Secondary | ICD-10-CM | POA: Diagnosis present

## 2021-09-11 DIAGNOSIS — N179 Acute kidney failure, unspecified: Secondary | ICD-10-CM | POA: Diagnosis present

## 2021-09-11 DIAGNOSIS — E8721 Acute metabolic acidosis: Secondary | ICD-10-CM | POA: Diagnosis present

## 2021-09-11 DIAGNOSIS — Z681 Body mass index (BMI) 19 or less, adult: Secondary | ICD-10-CM

## 2021-09-11 DIAGNOSIS — I7300 Raynaud's syndrome without gangrene: Secondary | ICD-10-CM | POA: Diagnosis present

## 2021-09-11 DIAGNOSIS — G9341 Metabolic encephalopathy: Secondary | ICD-10-CM | POA: Diagnosis present

## 2021-09-11 DIAGNOSIS — E871 Hypo-osmolality and hyponatremia: Secondary | ICD-10-CM | POA: Diagnosis present

## 2021-09-11 DIAGNOSIS — Z7951 Long term (current) use of inhaled steroids: Secondary | ICD-10-CM

## 2021-09-11 DIAGNOSIS — M0579 Rheumatoid arthritis with rheumatoid factor of multiple sites without organ or systems involvement: Secondary | ICD-10-CM | POA: Diagnosis present

## 2021-09-11 DIAGNOSIS — Z888 Allergy status to other drugs, medicaments and biological substances status: Secondary | ICD-10-CM

## 2021-09-11 DIAGNOSIS — Z66 Do not resuscitate: Secondary | ICD-10-CM | POA: Diagnosis present

## 2021-09-11 DIAGNOSIS — D631 Anemia in chronic kidney disease: Secondary | ICD-10-CM | POA: Diagnosis present

## 2021-09-11 DIAGNOSIS — E876 Hypokalemia: Secondary | ICD-10-CM | POA: Diagnosis present

## 2021-09-11 DIAGNOSIS — R630 Anorexia: Principal | ICD-10-CM | POA: Diagnosis present

## 2021-09-11 DIAGNOSIS — N1832 Chronic kidney disease, stage 3b: Secondary | ICD-10-CM | POA: Diagnosis present

## 2021-09-11 DIAGNOSIS — F509 Eating disorder, unspecified: Secondary | ICD-10-CM | POA: Diagnosis not present

## 2021-09-11 DIAGNOSIS — K3184 Gastroparesis: Secondary | ICD-10-CM | POA: Diagnosis present

## 2021-09-11 DIAGNOSIS — K5909 Other constipation: Secondary | ICD-10-CM | POA: Diagnosis present

## 2021-09-11 DIAGNOSIS — E860 Dehydration: Secondary | ICD-10-CM | POA: Diagnosis present

## 2021-09-11 DIAGNOSIS — E43 Unspecified severe protein-calorie malnutrition: Secondary | ICD-10-CM | POA: Diagnosis present

## 2021-09-11 DIAGNOSIS — J45909 Unspecified asthma, uncomplicated: Secondary | ICD-10-CM | POA: Diagnosis present

## 2021-09-11 DIAGNOSIS — E872 Acidosis, unspecified: Secondary | ICD-10-CM | POA: Diagnosis present

## 2021-09-11 DIAGNOSIS — Z79899 Other long term (current) drug therapy: Secondary | ICD-10-CM | POA: Diagnosis not present

## 2021-09-11 DIAGNOSIS — G629 Polyneuropathy, unspecified: Secondary | ICD-10-CM | POA: Diagnosis present

## 2021-09-11 DIAGNOSIS — F17210 Nicotine dependence, cigarettes, uncomplicated: Secondary | ICD-10-CM | POA: Diagnosis present

## 2021-09-11 DIAGNOSIS — E861 Hypovolemia: Secondary | ICD-10-CM | POA: Diagnosis present

## 2021-09-11 LAB — BASIC METABOLIC PANEL
Anion gap: 15 (ref 5–15)
BUN: 82 mg/dL — ABNORMAL HIGH (ref 6–20)
CO2: 9 mmol/L — ABNORMAL LOW (ref 22–32)
Calcium: 8.5 mg/dL — ABNORMAL LOW (ref 8.9–10.3)
Chloride: 105 mmol/L (ref 98–111)
Creatinine, Ser: 3.05 mg/dL — ABNORMAL HIGH (ref 0.44–1.00)
GFR, Estimated: 18 mL/min — ABNORMAL LOW (ref >60)
Glucose, Bld: 83 mg/dL (ref 70–99)
Potassium: 3.4 mmol/L — ABNORMAL LOW (ref 3.5–5.1)
Sodium: 129 mmol/L — ABNORMAL LOW (ref 135–145)

## 2021-09-11 LAB — BLOOD GAS, VENOUS
Acid-base deficit: 17.2 mmol/L — ABNORMAL HIGH (ref 0.0–2.0)
Bicarbonate: 10.6 mmol/L — ABNORMAL LOW (ref 20.0–28.0)
O2 Saturation: 48.8 %
Patient temperature: 37
pCO2, Ven: 31 mmHg — ABNORMAL LOW (ref 44–60)
pH, Ven: 7.14 — CL (ref 7.25–7.43)
pO2, Ven: <31 mmHg — CL (ref 32–45)

## 2021-09-11 LAB — MAGNESIUM: Magnesium: 2.3 mg/dL (ref 1.7–2.4)

## 2021-09-11 LAB — RESP PANEL BY RT-PCR (FLU A&B, COVID) ARPGX2
Influenza A by PCR: NEGATIVE
Influenza B by PCR: NEGATIVE
SARS Coronavirus 2 by RT PCR: NEGATIVE

## 2021-09-11 LAB — CBC
HCT: 35.8 % — ABNORMAL LOW (ref 36.0–46.0)
Hemoglobin: 11.7 g/dL — ABNORMAL LOW (ref 12.0–15.0)
MCH: 29.5 pg (ref 26.0–34.0)
MCHC: 32.7 g/dL (ref 30.0–36.0)
MCV: 90.4 fL (ref 80.0–100.0)
Platelets: 388 10*3/uL (ref 150–400)
RBC: 3.96 MIL/uL (ref 3.87–5.11)
RDW: 14.1 % (ref 11.5–15.5)
WBC: 13.4 10*3/uL — ABNORMAL HIGH (ref 4.0–10.5)
nRBC: 0 % (ref 0.0–0.2)

## 2021-09-11 LAB — PHOSPHORUS: Phosphorus: 4 mg/dL (ref 2.5–4.6)

## 2021-09-11 LAB — TROPONIN I (HIGH SENSITIVITY)
Troponin I (High Sensitivity): 20 ng/L — ABNORMAL HIGH (ref <18)
Troponin I (High Sensitivity): 24 ng/L — ABNORMAL HIGH (ref <18)

## 2021-09-11 MED ORDER — LEFLUNOMIDE 20 MG PO TABS
20.0000 mg | ORAL_TABLET | Freq: Every day | ORAL | Status: DC
  Administered 2021-09-12 – 2021-09-15 (×4): 20 mg via ORAL
  Filled 2021-09-11 (×4): qty 1

## 2021-09-11 MED ORDER — TIOTROPIUM BROMIDE MONOHYDRATE 18 MCG IN CAPS
1.0000 | ORAL_CAPSULE | Freq: Every day | RESPIRATORY_TRACT | Status: DC
  Administered 2021-09-12 – 2021-09-13 (×2): 18 ug via RESPIRATORY_TRACT
  Filled 2021-09-11: qty 5

## 2021-09-11 MED ORDER — PLECANATIDE 3 MG PO TABS: 3.0000 mg | ORAL_TABLET | Freq: Every day | ORAL | Status: DC

## 2021-09-11 MED ORDER — THEOPHYLLINE ER 400 MG PO TB24
400.0000 mg | ORAL_TABLET | Freq: Two times a day (BID) | ORAL | Status: DC
  Administered 2021-09-12 – 2021-09-15 (×7): 400 mg via ORAL
  Filled 2021-09-11 (×7): qty 1

## 2021-09-11 MED ORDER — THIAMINE HCL 100 MG PO TABS
50.0000 mg | ORAL_TABLET | Freq: Every day | ORAL | Status: DC
  Administered 2021-09-12 – 2021-09-15 (×4): 50 mg via ORAL
  Filled 2021-09-11 (×4): qty 1

## 2021-09-11 MED ORDER — ALBUTEROL SULFATE (2.5 MG/3ML) 0.083% IN NEBU: 2.5000 mg | INHALATION_SOLUTION | RESPIRATORY_TRACT | Status: DC | PRN

## 2021-09-11 MED ORDER — PANTOPRAZOLE SODIUM 40 MG PO TBEC
40.0000 mg | DELAYED_RELEASE_TABLET | Freq: Every day | ORAL | Status: DC
  Administered 2021-09-12 – 2021-09-15 (×4): 40 mg via ORAL
  Filled 2021-09-11 (×4): qty 1

## 2021-09-11 MED ORDER — MOMETASONE FURO-FORMOTEROL FUM 200-5 MCG/ACT IN AERO
2.0000 | INHALATION_SPRAY | Freq: Two times a day (BID) | RESPIRATORY_TRACT | Status: DC
Start: 2021-09-19 — End: 2021-09-15
  Filled 2021-09-11: qty 8.8

## 2021-09-11 MED ORDER — ENOXAPARIN SODIUM 30 MG/0.3ML IJ SOSY
30.0000 mg | PREFILLED_SYRINGE | INTRAMUSCULAR | Status: DC
  Administered 2021-09-12 – 2021-09-14 (×3): 30 mg via SUBCUTANEOUS
  Filled 2021-09-11 (×3): qty 0.3

## 2021-09-11 MED ORDER — TAMSULOSIN HCL 0.4 MG PO CAPS
0.4000 mg | ORAL_CAPSULE | Freq: Every day | ORAL | Status: DC
  Administered 2021-09-12 – 2021-09-14 (×3): 0.4 mg via ORAL
  Filled 2021-09-11 (×3): qty 1

## 2021-09-11 MED ORDER — SODIUM CHLORIDE 0.9 % IV BOLUS
1000.0000 mL | Freq: Once | INTRAVENOUS | Status: AC
Start: 2021-09-11 — End: 2021-09-11
  Administered 2021-09-11: 1000 mL via INTRAVENOUS

## 2021-09-11 MED ORDER — NEOMYCIN-POLYMYXIN-DEXAMETH 3.5-10000-0.1 OP SUSP
1.0000 [drp] | Freq: Four times a day (QID) | OPHTHALMIC | Status: DC
  Administered 2021-09-12 (×2): 1 [drp] via OPHTHALMIC
  Filled 2021-09-11 (×2): qty 5

## 2021-09-11 MED ORDER — MELATONIN 5 MG PO TABS
5.0000 mg | ORAL_TABLET | Freq: Every day | ORAL | Status: DC
  Administered 2021-09-12 – 2021-09-14 (×3): 5 mg via ORAL
  Filled 2021-09-11 (×3): qty 1

## 2021-09-11 MED ORDER — FLUOCINONIDE 0.05 % EX CREA: 1.0000 "application " | TOPICAL_CREAM | Freq: Two times a day (BID) | CUTANEOUS | Status: DC | PRN

## 2021-09-11 MED ORDER — DIFLUPREDNATE 0.05 % OP EMUL: 1.0000 [drp] | Freq: Every day | OPHTHALMIC | Status: DC

## 2021-09-11 MED ORDER — PREDNISOLONE ACETATE 1 % OP SUSP
1.0000 [drp] | Freq: Four times a day (QID) | OPHTHALMIC | Status: DC
  Administered 2021-09-12 – 2021-09-15 (×11): 1 [drp] via OPHTHALMIC
  Filled 2021-09-11: qty 1
  Filled 2021-09-11: qty 5

## 2021-09-11 MED ORDER — TRAZODONE HCL 50 MG PO TABS
25.0000 mg | ORAL_TABLET | Freq: Every evening | ORAL | Status: DC | PRN
  Administered 2021-09-12: 25 mg via ORAL
  Filled 2021-09-11: qty 1

## 2021-09-11 MED ORDER — URELLE 81 MG PO TABS: 1.0000 | ORAL_TABLET | Freq: Three times a day (TID) | ORAL | Status: DC | PRN

## 2021-09-11 MED ORDER — POTASSIUM CHLORIDE CRYS ER 20 MEQ PO TBCR: 20.0000 meq | EXTENDED_RELEASE_TABLET | Freq: Two times a day (BID) | ORAL | Status: DC

## 2021-09-11 MED ORDER — TRAMADOL HCL 50 MG PO TABS
50.0000 mg | ORAL_TABLET | Freq: Four times a day (QID) | ORAL | Status: DC | PRN
  Administered 2021-09-12 – 2021-09-13 (×2): 50 mg via ORAL
  Filled 2021-09-11 (×2): qty 1

## 2021-09-11 MED ORDER — ACETAMINOPHEN 650 MG RE SUPP: 650.0000 mg | Freq: Four times a day (QID) | RECTAL | Status: DC | PRN

## 2021-09-11 MED ORDER — DULOXETINE HCL 20 MG PO CPEP
40.0000 mg | ORAL_CAPSULE | Freq: Every morning | ORAL | Status: DC
  Administered 2021-09-12 – 2021-09-15 (×4): 40 mg via ORAL
  Filled 2021-09-11 (×4): qty 2

## 2021-09-11 MED ORDER — SOD CITRATE-CITRIC ACID 500-334 MG/5ML PO SOLN
15.0000 mL | Freq: Two times a day (BID) | ORAL | Status: DC
  Administered 2021-09-12 – 2021-09-15 (×7): 15 mL via ORAL
  Filled 2021-09-11 (×7): qty 15

## 2021-09-11 MED ORDER — GABAPENTIN 300 MG PO CAPS
300.0000 mg | ORAL_CAPSULE | Freq: Two times a day (BID) | ORAL | Status: DC
  Administered 2021-09-12: 300 mg via ORAL
  Filled 2021-09-11: qty 1

## 2021-09-11 MED ORDER — BRIMONIDINE TARTRATE 0.2 % OP SOLN
1.0000 [drp] | Freq: Every day | OPHTHALMIC | Status: DC
  Administered 2021-09-12 – 2021-09-15 (×4): 1 [drp] via OPHTHALMIC
  Filled 2021-09-11 (×2): qty 5

## 2021-09-11 MED ORDER — PENTOSAN POLYSULFATE SODIUM 100 MG PO CAPS
100.0000 mg | ORAL_CAPSULE | Freq: Every day | ORAL | Status: DC
  Administered 2021-09-12 – 2021-09-14 (×3): 100 mg via ORAL
  Filled 2021-09-11 (×4): qty 1

## 2021-09-11 MED ORDER — FLUTICASONE PROPIONATE 50 MCG/ACT NA SUSP: 1.0000 | Freq: Two times a day (BID) | NASAL | Status: DC | PRN

## 2021-09-11 MED ORDER — ACETAMINOPHEN 325 MG PO TABS
650.0000 mg | ORAL_TABLET | Freq: Four times a day (QID) | ORAL | Status: DC | PRN
  Administered 2021-09-12: 650 mg via ORAL
  Filled 2021-09-11: qty 2

## 2021-09-11 MED ORDER — FAMOTIDINE 20 MG PO TABS: 40.0000 mg | ORAL_TABLET | Freq: Every day | ORAL | Status: DC

## 2021-09-11 MED ORDER — LORATADINE 10 MG PO TABS
10.0000 mg | ORAL_TABLET | Freq: Every day | ORAL | Status: DC
  Administered 2021-09-12 – 2021-09-14 (×3): 10 mg via ORAL
  Filled 2021-09-11 (×4): qty 1

## 2021-09-11 MED ORDER — MAGNESIUM HYDROXIDE 400 MG/5ML PO SUSP: 30.0000 mL | Freq: Every day | ORAL | Status: DC | PRN

## 2021-09-11 MED ORDER — POTASSIUM CHLORIDE IN NACL 20-0.9 MEQ/L-% IV SOLN
INTRAVENOUS | Status: DC
  Filled 2021-09-11: qty 1000

## 2021-09-11 MED ORDER — PSYLLIUM 95 % PO PACK
1.0000 | PACK | Freq: Every day | ORAL | Status: DC
  Filled 2021-09-11 (×4): qty 1

## 2021-09-11 MED ORDER — METOCLOPRAMIDE HCL 10 MG PO TABS
10.0000 mg | ORAL_TABLET | Freq: Two times a day (BID) | ORAL | Status: DC
  Administered 2021-09-12: 10 mg via ORAL
  Filled 2021-09-11: qty 1

## 2021-09-11 MED ORDER — DOXEPIN HCL 50 MG PO CAPS
50.0000 mg | ORAL_CAPSULE | Freq: Every day | ORAL | Status: DC
  Administered 2021-09-12 – 2021-09-14 (×3): 50 mg via ORAL
  Filled 2021-09-11 (×4): qty 1

## 2021-09-11 MED ORDER — OXYBUTYNIN CHLORIDE ER 10 MG PO TB24
10.0000 mg | ORAL_TABLET | Freq: Every day | ORAL | Status: DC
  Administered 2021-09-12 – 2021-09-14 (×3): 10 mg via ORAL
  Filled 2021-09-11 (×4): qty 1

## 2021-09-11 NOTE — ED Notes (Signed)
Attempted to get urine sample from pt, pt states she did not understand instructions and did not get urine sample while using the restroom. Will try next time.

## 2021-09-11 NOTE — ED Triage Notes (Signed)
Pt here with weakness. Pt has CKD and states that when her levels are off she feels weak. Pt denies SOB or CP.

## 2021-09-11 NOTE — ED Provider Triage Note (Signed)
Emergency Medicine Provider Triage Evaluation Note  Katherine Perez , a 51 y.o. female  was evaluated in triage.  Pt complains of weakness.  Review of Systems  Positive: Weakness Negative: Chest pain/shortness of breath  Physical Exam  BP 114/67 (BP Location: Left Arm)   Pulse 89   Temp 97.6 F (36.4 C) (Oral)   Resp 16   Ht 5\' 9"  (1.753 m)   Wt 61.2 kg   SpO2 97%   BMI 19.94 kg/m  Gen:   Awake, no distress   Resp:  Normal effort  MSK:   Moves extremities without difficulty Other:   Medical Decision Making  Medically screening exam initiated at 1:52 PM.  Appropriate orders placed.  Katherine Perez was informed that the remainder of the evaluation will be completed by another provider, this initial triage assessment does not replace that evaluation, and the importance of remaining in the ED until their evaluation is complete.  Weakness protocols initiated   Versie Starks, PA-C 09/11/21 1353

## 2021-09-11 NOTE — ED Notes (Signed)
Purewick external catheter in place at this time.,

## 2021-09-11 NOTE — H&P (Incomplete)
[employer]   PATIENT NAME: Katherine Perez    MR#:  [id number]  DATE OF BIRTH:  07-Jazmin Vensel-1972  DATE OF ADMISSION:  09/11/2021  PRIMARY CARE PHYSICIAN: Gladstone Lighter, MD   Patient is coming from: Home  REQUESTING/REFERRING PHYSICIAN: Rada Hay, MD  CHIEF COMPLAINT:   Chief Complaint  Patient presents with  . Weakness    HISTORY OF PRESENT ILLNESS:  Katherine Perez is a 51 y.o. female with medical history significant for GERD, peripheral neuropathy, Raynaud's disease, asthma and anemia, who presented to the ER with acute onset of altered mental status with confusion and lethargy.  She admitted to having diarrhea for the last couple days with loose bowel movements.  She admitted to nausea and vomiting once with no melena protruding per rectum.  She denied any bilious vomitus or hematemesis or abdominal pain.  She has mild cough with expectoration of clear mucus.  No dyspnea or wheezing.  No fever or chills.  No chest pain or palpitations.  She admits to urinary urgency with no hematuria or dysuria.   ED Course: When she came to the ER, vital signs were within normal.  VBG showed pH 7.14 with HCO3 10.6.  BMP revealed sodium 129 potassium 3.4 with CO2 of 9 and anion gap 15 with BUN 82 and creatinine 3.05 compared to 19/0.91 on 05/16/2021.  High-sensitivity troponin I was 28 CBC showed leukocytosis 13.4 and anemia better than previous levels in January.Magnesium was 2.3 and phosphorus 4 high-sensitivity troponin was 24.  Influenza antigens and COVID-19 PCR came back negative.  EKG as reviewed by me : Showed normal sinus rhythm rate of 90 with right atrial and minimal voltage criteria for LVH. Imaging: Portable chest X ray showed no acute cardiopulmonary disease.  The patient was given 2 L bolus of IV normal saline.  She will be admitted to a medical telemetry bed for further evaluation and management. PAST MEDICAL HISTORY:   Past Medical History:  Diagnosis  Date  . Anemia   . Asthma   . CKD (chronic kidney disease)   . ETOH abuse   . GERD (gastroesophageal reflux disease)   . IC (interstitial cystitis)   . IDA (iron deficiency anemia) 10/07/2020  . Laxative abuse   . Neuropathy   . Pneumonia   . Raynaud disease     PAST SURGICAL HISTORY:   Past Surgical History:  Procedure Laterality Date  . COLONOSCOPY    . ERCP N/A 07/01/2020   Procedure: ENDOSCOPIC RETROGRADE CHOLANGIOPANCREATOGRAPHY (ERCP);  Surgeon: Lucilla Lame, MD;  Location: [facility] ENDOSCOPY;  Service: Endoscopy;  Laterality: N/A;  . ERCP N/A 10/18/2020   Procedure: ENDOSCOPIC RETROGRADE CHOLANGIOPANCREATOGRAPHY (ERCP) STENT REMOVAL;  Surgeon: Lucilla Lame, MD;  Location: [facility] ENDOSCOPY;  Service: Endoscopy;  Laterality: N/A;  . ESOPHAGOGASTRODUODENOSCOPY    . FRACTURE SURGERY     femur fracture left - 2019  . LEG SURGERY      SOCIAL HISTORY:   Social History   Tobacco Use  . Smoking status: Every Day    Packs/day: 1.00    Types: Cigarettes  . Smokeless tobacco: Never  Substance Use Topics  . Alcohol use: Yes    Comment: last drink 03/2020    FAMILY HISTORY:   Family History  Problem Relation Age of Onset  . Other Neg Hx     DRUG ALLERGIES:   Allergies  Allergen Reactions  . Cetirizine Hives  . Diazepam Other (See Comments)    Depression   .  Baclofen Anxiety and Other (See Comments)    AMS - "Really messed me up, caused me to drop things"     REVIEW OF SYSTEMS:   ROS As per history of present illness. All pertinent systems were reviewed above. Constitutional, HEENT, cardiovascular, respiratory, GI, GU, musculoskeletal, neuro, psychiatric, endocrine, integumentary and hematologic systems were reviewed and are otherwise negative/unremarkable except for positive findings mentioned above in the HPI.   MEDICATIONS AT HOME:   Prior to Admission medications   Medication Sig Start Date End Date Taking? Authorizing Provider  ADBRY 150 MG/ML SOSY Inject  into the skin. 04/27/21  Yes [provider]  albuterol (VENTOLIN HFA) 108 (90 Base) MCG/ACT inhaler Inhale 2 puffs into the lungs every 4 (four) hours as needed for shortness of breath or wheezing. 06/07/19  Yes [provider]  amitriptyline (ELAVIL) 25 MG tablet Take 50 mg by mouth at bedtime.   Yes [provider]  Brimonidine Tartrate 0.025 % SOLN Place 1 drop into both eyes daily.   Yes [provider]  Difluprednate 0.05 % EMUL Apply 1 drop to eye daily. 08/24/21  Yes [provider]  doxepin (SINEQUAN) 50 MG capsule Take 50 mg by mouth at bedtime.   Yes [provider]  DULoxetine (CYMBALTA) 20 MG capsule Take 40 mg by mouth in the morning.   Yes [provider]  Dupilumab (DUPIXENT [place]) Inject 300 mg into the skin every 14 (fourteen) days. (Fridays)   Yes [provider]  famotidine (PEPCID) 40 MG tablet Take 40 mg by mouth at bedtime. 10/07/19  Yes [provider]  fexofenadine (ALLEGRA) 180 MG tablet Take 180 mg by mouth daily as needed for allergies.   Yes [provider]  FIBER PO Take 1 capsule by mouth daily.   Yes [provider]  Fluticasone-Salmeterol (ADVAIR) 250-50 MCG/DOSE AEPB Inhale 1 puff into the lungs 2 (two) times daily. 09/14/19  Yes [provider]  gabapentin (NEURONTIN) 300 MG capsule Take 1 capsule (300 mg total) by mouth 2 (two) times daily. 08/12/20  Yes Sharen Hones, MD  leflunomide (ARAVA) 20 MG tablet Take 20 mg by mouth daily.   Yes [provider]  medroxyPROGESTERone (DEPO-PROVERA) 150 MG/ML injection Inject 150 mg into the muscle every 3 (three) months.   Yes [provider]  melatonin 3 MG TABS tablet Take 2 tablets (6 mg total) by mouth at bedtime. 07/06/20  Yes Val Riles, MD  metoCLOPramide (REGLAN) 10 MG tablet Take 10 mg by mouth in the morning and at bedtime. 10/17/19  Yes [provider]  neomycin-polymyxin b-dexamethasone  (MAXITROL) 3.5-10000-0.1 SUSP Place 1 drop into both eyes every 6 (six) hours. 07/19/21  Yes [provider]  oxybutynin (DITROPAN-XL) 10 MG 24 hr tablet Take 10 mg by mouth daily in the afternoon.   Yes [provider]  pentosan polysulfate (ELMIRON) 100 MG capsule Take 100 mg by mouth at bedtime.   Yes [provider]  Plecanatide 3 MG TABS Take 3 mg by mouth daily at 6 (six) AM.    Yes [provider]  potassium chloride SA (KLOR-CON) 20 MEQ tablet Take 1 tablet (20 mEq total) by mouth 2 (two) times daily. 03/11/21  Yes Annita Brod, MD  prednisoLONE acetate (PRED FORTE) 1 % ophthalmic suspension Place 1 drop into both eyes 4 (four) times daily. 08/20/21  Yes [provider]  promethazine (PHENERGAN) 25 MG tablet Take 25 mg by mouth every 8 (eight) hours as  needed for nausea or vomiting.  10/23/19  Yes [provider]  RABEprazole (ACIPHEX) 20 MG tablet Take 20 mg by mouth in the morning and at bedtime.   Yes [provider]  sodium citrate-citric acid (ORACIT) 500-334 MG/5ML solution Take 15 mLs by mouth in the morning and at bedtime. 05/30/20  Yes [provider]  SPIRIVA RESPIMAT 2.5 MCG/ACT AERS SMARTSIG:2 Puff(s) By Mouth Daily 08/22/21  Yes [provider]  tamsulosin (FLOMAX) 0.4 MG CAPS capsule Take 0.4 mg by mouth at bedtime. 10/10/19  Yes [provider]  theophylline (UNIPHYL) 400 MG 24 hr tablet Take 400 mg by mouth 2 (two) times daily.  10/10/19  Yes [provider]  thiamine (VITAMIN B-1) 50 MG tablet Take 50 mg by mouth daily.   Yes [provider]  traMADol (ULTRAM) 50 MG tablet Take 50 mg by mouth every 6 (six) hours as needed for moderate pain.   Yes [provider]  fluocinonide cream (LIDEX) 9.37 % Apply 1 application topically 2 (two) times daily as needed (skin irritations).    [provider]  fluticasone (FLONASE) 50 MCG/ACT nasal spray Place 1-2 sprays  into both nostrils 2 (two) times daily as needed for allergies or rhinitis.    [provider]  Meth-Hyo-M Bl-Na Phos-Ph Sal (URIBEL) 118 MG CAPS Take 1 capsule by mouth 3 (three) times daily as needed (Urinary symptoms).    [provider]      VITAL SIGNS:  Blood pressure 135/70, pulse 80, temperature 98.1 F (36.7 C), temperature source Oral, resp. rate 14, height 5\' 9"  (1.753 m), weight 61.2 kg, SpO2 93 %.  PHYSICAL EXAMINATION:  Physical Exam  GENERAL:  51 y.o.-year-old Caucasian female patient lying in the bed with no acute distress.  EYES: Pupils equal, round, reactive to light and accommodation. No scleral icterus. Extraocular muscles intact.  HEENT: Head atraumatic, normocephalic. Oropharynx with slightly dry mucous membrane and tongue and nasopharynx clear.  NECK:  Supple, no jugular venous distention. No thyroid enlargement, no tenderness.  LUNGS: Normal breath sounds bilaterally, no wheezing, rales,rhonchi or crepitation. No use of accessory muscles of respiration.  CARDIOVASCULAR: Regular rate and rhythm, S1, S2 normal. No murmurs, rubs, or gallops.  ABDOMEN: Soft, nondistended, nontender. Bowel sounds present. No organomegaly or mass.  EXTREMITIES: No pedal edema, cyanosis, or clubbing.  NEUROLOGIC: Cranial nerves II through XII are intact. Muscle strength 5/5 in all extremities. Sensation intact. Gait not checked.  PSYCHIATRIC: The patient is alert and oriented x 3.  Normal affect and good eye contact. SKIN: She has scattered eczematous dermatitic hypopigmented patches throughout upper and lower extremities and her trunk.  LABORATORY PANEL:   CBC Recent Labs  Lab 09/11/21 1353  WBC 13.4*  HGB 11.7*  HCT 35.8*  PLT 388   ------------------------------------------------------------------------------------------------------------------  Chemistries  Recent Labs  Lab 09/11/21 1353 09/11/21 1553  NA 129*  --   K 3.4*  --   CL 105  --   CO2 9*   --   GLUCOSE 83  --   BUN 82*  --   CREATININE 3.05*  --   CALCIUM 8.5*  --   MG  --  2.3   ------------------------------------------------------------------------------------------------------------------  Cardiac Enzymes No results for input(s): TROPONINI in the last 168 hours. ------------------------------------------------------------------------------------------------------------------  RADIOLOGY:  DG Chest 1 View  Result Date: 09/11/2021 CLINICAL DATA:  [id number] EXAM: CHEST  1 VIEW COMPARISON:  None Available. FINDINGS: Lungs are well expanded, symmetric, and clear. No pneumothorax or  pleural effusion. Cardiac size within normal limits. Pulmonary vascularity is normal. Osseous structures are age-appropriate. No acute bone abnormality. IMPRESSION: No active disease. Electronically Signed   By: Fidela Salisbury M.D.   On: 09/11/2021 23:19      IMPRESSION AND PLAN:  Assessment and Plan: * AKI (acute kidney injury) ([place]) - The patient will be admitted to a medical telemetry bed. - This is likely prerenal due to volume depletion and dehydration from recurrent diarrhea. - She will be hydrated with IV normal saline with added potassium chloride. - We will follow BMP. - We will avoid nephrotoxins.  Hypokalemia - We will replace potassium and check magnesium level.  Acute metabolic encephalopathy - This is clearly secondary to uremia with acute kidney injury. - We will follow neurochecks with hydration.  Acute metabolic acidosis - This is like secondary to acute kidney injury. - We will place on hydration with IV normal saline and start her on IV bicarbonate drip  Hyponatremia -This is likely hypovolemic. - She will be hydrated with IV normal saline and will follow her BMP.  Asthma, chronic - We will continue her inhalers and theophylline.  Peripheral neuropathy - We will continue Neurontin.  GERD without esophagitis We will continue Pepcid   DVT prophylaxis:  Lovenox.  Advanced Care Planning:  Code Status: She is DNR/DNI.  This was discussed with her. Family Communication:  The plan of care was discussed in details with the patient (and family). I answered all questions. The patient agreed to proceed with the above mentioned plan. Further management will depend upon hospital course. Disposition Plan: Back to previous home environment Consults called: none.  All the records are reviewed and case discussed with ED provider.  Status is: Inpatient   At the time of the admission, it appears that the appropriate admission status for this patient is inpatient.  This is judged to be reasonable and necessary in order to provide the required intensity of service to ensure the patient's safety given the presenting symptoms, physical exam findings and initial radiographic and laboratory data in the context of comorbid conditions.  The patient requires inpatient status due to high intensity of service, high risk of further deterioration and high frequency of surveillance required.  I certify that at the time of admission, it is my clinical judgment that the patient will require inpatient hospital care extending more than 2 midnights.                            Dispo: The patient is from: Home              Anticipated d/c is to: Home              Patient currently is not medically stable to d/c.              Difficult to place patient: No  Christel Mormon M.D on 09/12/2021 at 2:59 AM  [facility]   From 7 PM-7 AM, contact night-coverage [email or web address]  CC: Primary care physician; Gladstone Lighter, MD

## 2021-09-11 NOTE — ED Provider Notes (Signed)
[facility] Provider Note    Event Date/Time   First MD Initiated Contact with Patient 09/11/21 2110     (approximate)   History   Weakness   HPI  Katherine Perez is a 51 y.o. female with past medical history of CKD, asthma, anemia, GERD who presents with generalized weakness.  Patient notes that about a week ago she started having some congestion cough and feeling generally unwell.  Had decreased p.o. intake.  Denies fevers chills abdominal pain.  She started to feel more tired and lethargic and her husband noted she was more confused over the last day or so.  They said that she typically gets this way when her kidney function has decreased.  Patient takes sodium citrate for chronic metabolic acidosis.  Has missed several doses over the last several days due to decreased p.o. intake.  Overall she feels better from an acute illness standpoint but still feels very fatigued.  Typically walks with a walker was only able to do so minimally over the last several days.  Past Medical History:  Diagnosis Date   Anemia    Asthma    CKD (chronic kidney disease)    ETOH abuse    GERD (gastroesophageal reflux disease)    IC (interstitial cystitis)    IDA (iron deficiency anemia) 10/07/2020   Laxative abuse    Neuropathy    Pneumonia    Raynaud disease     Patient Active Problem List   Diagnosis Date Noted   Decubitus ulcer of coccyx, stage 2 ([place]) 05/15/2021   Neuropathy    Chronic constipation    Rheumatoid arthritis involving multiple sites with positive rheumatoid factor ([place]) 10/27/2020   Encounter for adjustment or removal of myringotomy device (stent) (tube)    IDA (iron deficiency anemia) 10/07/2020   AKI (acute kidney injury) ([place]) 62/37/6283   Metabolic acidosis 15/17/6160   Other specified diseases of biliary tract    Disease of biliary tract, unspecified    Bile leak 06/30/2020   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   Hyperkalemia  06/27/2020   CKD (chronic kidney disease), stage IV ([place]) 06/27/2020   Acute pancreatitis without infection or necrosis 04/23/2020   GERD without esophagitis 04/23/2020   Hypokalemia 04/23/2020   Prolonged QT interval 04/23/2020   Hypercalcemia    Pressure injury of skin 10/27/2019   Hyponatremia 10/26/2019   Increased anion gap metabolic acidosis 73/71/0626   Acute renal failure superimposed on stage 4 chronic kidney disease ([place]) 94/85/4627   Acute metabolic encephalopathy 03/50/0938   Leukocytosis 10/26/2019   Slow transit constipation 09/03/2018   Laxative habit 12/31/2014   Chronic interstitial cystitis 11/18/2014     Physical Exam  Triage Vital Signs: ED Triage Vitals [09/11/21 1351]  Enc Vitals Group     BP 114/67     Pulse Rate 89     Resp 16     Temp 97.6 F (36.4 C)     Temp Source Oral     SpO2 97 %     Weight 135 lb (61.2 kg)     Height 5\' 9"  (1.753 m)     Head Circumference      Peak Flow      Pain Score 0     Pain Loc      Pain Edu?      Excl. in [place]?     Most recent vital signs: Vitals:   09/11/21 1836 09/11/21 2128  BP: 100/70 (!) 158/76  Pulse:  92 92  Resp: 16 19  Temp: 98.1 F (36.7 C)   SpO2: 97% 95%     General: Awake, no distress.  Appears chronically ill CV:  Good peripheral perfusion.  Resp:  Normal effort.  Scattered rhonchi that clear with coughing Abd:  No distention.  Abdomen is soft nontender throughout Neuro:             Awake, Alert, Oriented x 3  Other:  Dry mucous membranes   ED Results / Procedures / Treatments  Labs (all labs ordered are listed, but only abnormal results are displayed) Labs Reviewed  BASIC METABOLIC PANEL - Abnormal; Notable for the following components:      Result Value   Sodium 129 (*)    Potassium 3.4 (*)    CO2 9 (*)    BUN 82 (*)    Creatinine, Ser 3.05 (*)    Calcium 8.5 (*)    GFR, Estimated 18 (*)    All other components within normal limits  CBC - Abnormal; Notable for the following  components:   WBC 13.4 (*)    Hemoglobin 11.7 (*)    HCT 35.8 (*)    All other components within normal limits  BLOOD GAS, VENOUS - Abnormal; Notable for the following components:   pH, Ven 7.14 (*)    pCO2, Ven 31 (*)    pO2, Ven <31 (*)    Bicarbonate 10.6 (*)    Acid-base deficit 17.2 (*)    All other components within normal limits  TROPONIN I (HIGH SENSITIVITY) - Abnormal; Notable for the following components:   Troponin I (High Sensitivity) 20 (*)    All other components within normal limits  TROPONIN I (HIGH SENSITIVITY) - Abnormal; Notable for the following components:   Troponin I (High Sensitivity) 24 (*)    All other components within normal limits  RESP PANEL BY RT-PCR (FLU A&B, COVID) ARPGX2  URINALYSIS, ROUTINE W REFLEX MICROSCOPIC  MAGNESIUM  PHOSPHORUS  CBG MONITORING, ED  POC URINE PREG, ED     EKG  EKG interpreted by myself shows normal sinus rhythm normal axis normal intervals no acute ischemic changes   RADIOLOGY I reviewed the CXR which does not show any acute cardiopulmonary process; agree with radiology report     PROCEDURES:  Critical Care performed: No  .1-3 Lead EKG Interpretation Performed by: Rada Hay, MD Authorized by: Rada Hay, MD     Interpretation: normal     ECG rate assessment: normal     Ectopy: none     Conduction: normal    The patient is on the cardiac monitor to evaluate for evidence of arrhythmia and/or significant heart rate changes.   MEDICATIONS ORDERED IN ED: Medications  sodium chloride 0.9 % bolus 1,000 mL (0 mLs Intravenous Hold 09/11/21 2211)  sodium chloride 0.9 % bolus 1,000 mL (1,000 mLs Intravenous Bolus 09/11/21 2147)     IMPRESSION / MDM / ASSESSMENT AND PLAN / ED COURSE  I reviewed the triage vital signs and the nursing notes.                              Differential diagnosis includes, but is not limited to, prerenal AKI, ATN, pneumonia, viral illness  Patient is a 51 year old  female with a history of CKD secondary to multiple bouts of AKI presents with increasing weakness lethargy and some confusion.  This is in the setting of a  recent respiratory illness with decreased p.o. intake.  Patient's vitals are within normal limits.  She appears chronically ill but is nontoxic.  Does have dry mucous membranes.  Abdomen is benign.  Did have diarrhea today but this is typical for her.  She has some scattered rhonchi on lung exam.  Her labs are notable for creatinine of 3, 0.93 months ago.  Bicarb is 9 will send VBG.  She has no anion gap suspect this is in the setting of her renal failure.  BUN is 82.  She also has a leukocytosis of 13.4 hemoglobin 11.7.  Troponin is 24, has been elevated chronically.  EKG has no ischemic changes she has no chest pain.  We will send viral studies and chest x-ray to rule out pneumonia.  Discussed with Dr. Candiss Norse with nephrology who agrees with fluid resuscitation and reassessment.   VBG shows a metabolic acidosis with pH of 7.14.  Will give 2 L normal saline which should help with this.  I reviewed her chest x-ray which does not show any obvious pneumonia.  Will discuss with hospitalist for admission.   FINAL CLINICAL IMPRESSION(S) / ED DIAGNOSES   Final diagnoses:  AKI (acute kidney injury) ([place])  Metabolic acidosis     Rx / DC Orders   ED Discharge Orders     None        Note:  This document was prepared using Dragon voice recognition software and may include unintentional dictation errors.   Rada Hay, MD 09/11/21 2242

## 2021-09-12 ENCOUNTER — Other Ambulatory Visit: Payer: BC Managed Care – PPO

## 2021-09-12 ENCOUNTER — Inpatient Hospital Stay: Payer: BC Managed Care – PPO

## 2021-09-12 ENCOUNTER — Encounter: Payer: Self-pay | Admitting: Family Medicine

## 2021-09-12 DIAGNOSIS — E8721 Acute metabolic acidosis: Secondary | ICD-10-CM

## 2021-09-12 DIAGNOSIS — J45909 Unspecified asthma, uncomplicated: Secondary | ICD-10-CM

## 2021-09-12 DIAGNOSIS — N179 Acute kidney failure, unspecified: Secondary | ICD-10-CM | POA: Diagnosis not present

## 2021-09-12 DIAGNOSIS — G629 Polyneuropathy, unspecified: Secondary | ICD-10-CM

## 2021-09-12 LAB — URINALYSIS, ROUTINE W REFLEX MICROSCOPIC
Bilirubin Urine: NEGATIVE
Glucose, UA: NEGATIVE mg/dL
Ketones, ur: NEGATIVE mg/dL
Nitrite: NEGATIVE
Protein, ur: 100 mg/dL — AB
Specific Gravity, Urine: 1.008 (ref 1.005–1.030)
Squamous Epithelial / HPF: NONE SEEN (ref 0–5)
pH: 6 (ref 5.0–8.0)

## 2021-09-12 LAB — BASIC METABOLIC PANEL
Anion gap: 10 (ref 5–15)
BUN: 78 mg/dL — ABNORMAL HIGH (ref 6–20)
CO2: 8 mmol/L — ABNORMAL LOW (ref 22–32)
Calcium: 8 mg/dL — ABNORMAL LOW (ref 8.9–10.3)
Chloride: 114 mmol/L — ABNORMAL HIGH (ref 98–111)
Creatinine, Ser: 2.72 mg/dL — ABNORMAL HIGH (ref 0.44–1.00)
GFR, Estimated: 21 mL/min — ABNORMAL LOW (ref >60)
Glucose, Bld: 86 mg/dL (ref 70–99)
Potassium: 2.9 mmol/L — ABNORMAL LOW (ref 3.5–5.1)
Sodium: 132 mmol/L — ABNORMAL LOW (ref 135–145)

## 2021-09-12 LAB — CBC
HCT: 31 % — ABNORMAL LOW (ref 36.0–46.0)
Hemoglobin: 10.2 g/dL — ABNORMAL LOW (ref 12.0–15.0)
MCH: 29.9 pg (ref 26.0–34.0)
MCHC: 32.9 g/dL (ref 30.0–36.0)
MCV: 90.9 fL (ref 80.0–100.0)
Platelets: 326 10*3/uL (ref 150–400)
RBC: 3.41 MIL/uL — ABNORMAL LOW (ref 3.87–5.11)
RDW: 14.2 % (ref 11.5–15.5)
WBC: 12 10*3/uL — ABNORMAL HIGH (ref 4.0–10.5)
nRBC: 0 % (ref 0.0–0.2)

## 2021-09-12 LAB — COMPREHENSIVE METABOLIC PANEL
ALT: 14 U/L (ref 0–44)
AST: 17 U/L (ref 15–41)
Albumin: 2.6 g/dL — ABNORMAL LOW (ref 3.5–5.0)
Alkaline Phosphatase: 94 U/L (ref 38–126)
Anion gap: 9 (ref 5–15)
BUN: 67 mg/dL — ABNORMAL HIGH (ref 6–20)
CO2: 11 mmol/L — ABNORMAL LOW (ref 22–32)
Calcium: 7.9 mg/dL — ABNORMAL LOW (ref 8.9–10.3)
Chloride: 110 mmol/L (ref 98–111)
Creatinine, Ser: 2.4 mg/dL — ABNORMAL HIGH (ref 0.44–1.00)
GFR, Estimated: 24 mL/min — ABNORMAL LOW (ref >60)
Glucose, Bld: 138 mg/dL — ABNORMAL HIGH (ref 70–99)
Potassium: 3.1 mmol/L — ABNORMAL LOW (ref 3.5–5.1)
Sodium: 130 mmol/L — ABNORMAL LOW (ref 135–145)
Total Bilirubin: 0.4 mg/dL (ref 0.3–1.2)
Total Protein: 5.3 g/dL — ABNORMAL LOW (ref 6.5–8.1)

## 2021-09-12 LAB — MAGNESIUM: Magnesium: 1.8 mg/dL (ref 1.7–2.4)

## 2021-09-12 MED ORDER — POTASSIUM CHLORIDE CRYS ER 20 MEQ PO TBCR
40.0000 meq | EXTENDED_RELEASE_TABLET | Freq: Two times a day (BID) | ORAL | Status: DC
  Administered 2021-09-12 – 2021-09-13 (×2): 40 meq via ORAL
  Filled 2021-09-12 (×3): qty 2

## 2021-09-12 MED ORDER — POTASSIUM CHLORIDE 10 MEQ/100ML IV SOLN: 10.0000 meq | INTRAVENOUS | Status: DC

## 2021-09-12 MED ORDER — FAMOTIDINE 20 MG PO TABS
20.0000 mg | ORAL_TABLET | Freq: Every day | ORAL | Status: DC
  Administered 2021-09-12 – 2021-09-14 (×3): 20 mg via ORAL
  Filled 2021-09-12 (×3): qty 1

## 2021-09-12 MED ORDER — SODIUM BICARBONATE 8.4 % IV SOLN: INTRAVENOUS | Status: DC

## 2021-09-12 MED ORDER — POTASSIUM CHLORIDE 10 MEQ/100ML IV SOLN
10.0000 meq | INTRAVENOUS | Status: AC
  Administered 2021-09-12 – 2021-09-13 (×6): 10 meq via INTRAVENOUS
  Filled 2021-09-12 (×5): qty 100

## 2021-09-12 MED ORDER — SODIUM BICARBONATE 8.4 % IV SOLN
INTRAVENOUS | Status: DC
  Filled 2021-09-12 (×3): qty 150
  Filled 2021-09-12: qty 1000
  Filled 2021-09-12: qty 150
  Filled 2021-09-12: qty 1000
  Filled 2021-09-12 (×3): qty 150
  Filled 2021-09-12 (×3): qty 1000

## 2021-09-12 NOTE — Progress Notes (Incomplete)
       CROSS COVER NOTE  NAME: Teyla Skidgel MRN: [id number] DOB : 1970-06-03  Muscle spasm Tylenol (gabapentin held 2/2 AMS) Check electrolytes  Neomia Glass DNP, MHA, FNP-BC Nurse Practitioner [facility] Pager [phone]

## 2021-09-12 NOTE — Assessment & Plan Note (Signed)
-   This is like secondary to acute kidney injury. - We will place on hydration with IV normal saline and start her on IV bicarbonate drip

## 2021-09-12 NOTE — Assessment & Plan Note (Signed)
-  This is likely hypovolemic. - She will be hydrated with IV normal saline and will follow her BMP.

## 2021-09-12 NOTE — Assessment & Plan Note (Signed)
We will continue Pepcid

## 2021-09-12 NOTE — Progress Notes (Signed)
Admission profile updated. ?

## 2021-09-12 NOTE — Assessment & Plan Note (Signed)
-   The patient will be admitted to a medical telemetry bed. - This is likely prerenal due to volume depletion and dehydration from recurrent diarrhea. - She will be hydrated with IV normal saline with added potassium chloride. - We will follow BMP. - We will avoid nephrotoxins.

## 2021-09-12 NOTE — Progress Notes (Signed)
[facility]  ROUNDING NOTE   Subjective:   Katherine Perez is a 51 y.o. female with past medical conditions including Raynauds disease, asthma, anemia, GERD, peripheral neuropathy, and chronic kidney diease stage 3b. Patient presents to emergency department with complaints of weakness, altered mental status and lethargy. She is being admitted for Metabolic acidosis [Z00.92] AKI (acute kidney injury) [facility]) [N17.9]  Patient is known to our practice and is followed by Dr Holley Raring outpatient. Her last office visit was on 08/02/21. Patient lives at home with husband. States she has had diarrhea for a few days. Nausea present with one or two vomiting occurrences. Productive cough with clear mucus. She states she has taken all prescribed medications. Denies shortness of breath.   Labs on ED arrival include sodium 129, potassium 3.4, bicarb 9, BUN 82, and creatinine 3.05 with GFR 18.  Elevated WBCs 13.4 with hemoglobin 11.7.  Respiratory panel negative for influenza and COVID-19. Negative chest x-ray and no hydronephrosis seen on CT abdomen pelvis.  Labs obtained this morning include sodium 132, potassium 2.9, bicarbonate, BUN 78, and creatinine 2.72 with GFR 21.  Baseline creatinine appears to be 1.89 with GFR 32 on 08/08/2021.  We have been consulted to manage acute kidney injury.  Objective:  Vital signs in last 24 hours:  Temp:  [97.6 F (36.4 C)-98.7 F (37.1 C)] 98.3 F (36.8 C) (05/23 1223) Pulse Rate:  [79-95] 86 (05/23 1223) Resp:  [14-19] 16 (05/23 1223) BP: (100-158)/(61-76) 135/72 (05/23 1223) SpO2:  [90 %-100 %] 100 % (05/23 1223) Weight:  [61.2 kg] 61.2 kg (05/22 1351)  Weight change:  Filed Weights   09/11/21 1351  Weight: 61.2 kg    Intake/Output: No intake/output data recorded.   Intake/Output this shift:  No intake/output data recorded.  Physical Exam: General: NAD   Head: Normocephalic, atraumatic.  Dry oral mucosal membranes  Eyes:  Anicteric  Lungs:  Clear to auscultation, normal effort, room air  Heart: Regular rate and rhythm  Abdomen:  Soft, nontender  Extremities: No peripheral edema.  Neurologic: Nonfocal, moving all four extremities  Skin: No lesions  Access: None    Basic Metabolic Panel: Recent Labs  Lab 09/11/21 1353 09/11/21 1553 09/12/21 0307  NA 129*  --  132*  K 3.4*  --  2.9*  CL 105  --  114*  CO2 9*  --  8*  GLUCOSE 83  --  86  BUN 82*  --  78*  CREATININE 3.05*  --  2.72*  CALCIUM 8.5*  --  8.0*  MG  --  2.3  --   PHOS  --  4.0  --     Liver Function Tests: No results for input(s): AST, ALT, ALKPHOS, BILITOT, PROT, ALBUMIN in the last 168 hours. No results for input(s): LIPASE, AMYLASE in the last 168 hours. No results for input(s): AMMONIA in the last 168 hours.  CBC: Recent Labs  Lab 09/11/21 1353 09/12/21 0307  WBC 13.4* 12.0*  HGB 11.7* 10.2*  HCT 35.8* 31.0*  MCV 90.4 90.9  PLT 388 326    Cardiac Enzymes: No results for input(s): CKTOTAL, CKMB, CKMBINDEX, TROPONINI in the last 168 hours.  BNP: Invalid input(s): POCBNP  CBG: No results for input(s): GLUCAP in the last 168 hours.  Microbiology: Results for orders placed or performed during the hospital encounter of 09/11/21  Resp Panel by RT-PCR (Flu A&B, Covid) Nasopharyngeal Swab     Status: None   Collection Time: 09/11/21  9:55  PM   Specimen: Nasopharyngeal Swab; Nasopharyngeal(NP) swabs in vial transport medium  Result Value Ref Range Status   SARS Coronavirus 2 by RT PCR NEGATIVE NEGATIVE Final    Comment: (NOTE) SARS-CoV-2 target nucleic acids are NOT DETECTED.  The SARS-CoV-2 RNA is generally detectable in upper respiratory specimens during the acute phase of infection. The lowest concentration of SARS-CoV-2 viral copies this assay can detect is 138 copies/mL. A negative result does not preclude SARS-Cov-2 infection and should not be used as the sole basis for treatment or other patient management  decisions. A negative result may occur with  improper specimen collection/handling, submission of specimen other than nasopharyngeal swab, presence of viral mutation(s) within the areas targeted by this assay, and inadequate number of viral copies(<138 copies/mL). A negative result must be combined with clinical observations, patient history, and epidemiological information. The expected result is Negative.  Fact Sheet for Patients:  EntrepreneurPulse.com.au  Fact Sheet for Healthcare Providers:  IncredibleEmployment.be  This test is no t yet approved or cleared by the [place] FDA and  has been authorized for detection and/or diagnosis of SARS-CoV-2 by FDA under an Emergency Use Authorization (EUA). This EUA will remain  in effect (meaning this test can be used) for the duration of the COVID-19 declaration under Section 564(b)(1) of the Act, 21 U.S.C.section 360bbb-3(b)(1), unless the authorization is terminated  or revoked sooner.       Influenza A by PCR NEGATIVE NEGATIVE Final   Influenza B by PCR NEGATIVE NEGATIVE Final    Comment: (NOTE) The Xpert Xpress SARS-CoV-2/FLU/RSV plus assay is intended as an aid in the diagnosis of influenza from Nasopharyngeal swab specimens and should not be used as a sole basis for treatment. Nasal washings and aspirates are unacceptable for Xpert Xpress SARS-CoV-2/FLU/RSV testing.  Fact Sheet for Patients: EntrepreneurPulse.com.au  Fact Sheet for Healthcare Providers: IncredibleEmployment.be  This test is not yet approved or cleared by the [place] FDA and has been authorized for detection and/or diagnosis of SARS-CoV-2 by FDA under an Emergency Use Authorization (EUA). This EUA will remain in effect (meaning this test can be used) for the duration of the COVID-19 declaration under Section 564(b)(1) of the Act, 21 U.S.C. section 360bbb-3(b)(1), unless the  authorization is terminated or revoked.  Performed at [facility], [address] [place], [place] [address]     Coagulation Studies: No results for input(s): LABPROT, INR in the last 72 hours.  Urinalysis: Recent Labs    09/12/21 0243  COLORURINE YELLOW*  LABSPEC 1.008  PHURINE 6.0  GLUCOSEU NEGATIVE  HGBUR MODERATE*  BILIRUBINUR NEGATIVE  KETONESUR NEGATIVE  PROTEINUR 100*  NITRITE NEGATIVE  LEUKOCYTESUR LARGE*      Imaging: CT ABDOMEN PELVIS WO CONTRAST  Result Date: 09/12/2021 CLINICAL DATA:  Abdominal pain, nausea, vomiting EXAM: CT ABDOMEN AND PELVIS WITHOUT CONTRAST TECHNIQUE: Multidetector CT imaging of the abdomen and pelvis was performed following the standard protocol without IV contrast. RADIATION DOSE REDUCTION: This exam was performed according to the departmental dose-optimization program which includes automated exposure control, adjustment of the mA and/or kV according to patient size and/or use of iterative reconstruction technique. COMPARISON:  07/04/2020 FINDINGS: Lower chest: Small linear densities in the posterior lower lung fields may suggest scarring or subsegmental atelectasis. Breathing motion artifacts limit evaluation of lung fields. Minimal pleural effusions are seen. Scattered coronary artery calcifications are seen. Hepatobiliary: There is coarse calcification in the right lobe with no significant interval change. There is no significant dilation of bile  ducts. Gallbladder is not seen. Pancreas: No focal abnormality is seen. Spleen: Unremarkable. Adrenals/Urinary Tract: Adrenals are unremarkable. There is no hydronephrosis. There are tiny calcific densities in the renal cortex in both kidneys. No definite renal stones are seen. Ureters are not dilated. Urinary bladder is distended. There is no wall thickening in the bladder. Stomach/Bowel: Stomach is moderately distended. There is wall thickening in the stomach. Duodenum is distended. There is  mild to moderate dilation of small-bowel loops. There is mild diffuse wall thickening in the small bowel loops. Appendix is not dilated. There is high density in the lumen of appendix. There is no focal pericecal inflammation. There is fluid in the lumen of colon. There is gaseous distention of transverse colon. There is no focal wall thickening in the colon. Vascular/Lymphatic: Calcifications are seen in aorta and its major branches. Reproductive: Uterus is difficult to visualize. No dominant adnexal masses are seen. Other: There is no pneumoperitoneum. There is minimal ascites in the pelvis. Musculoskeletal: There is minimal anterolisthesis at L4-L5 level along with disc space narrowing. IMPRESSION: There is no evidence of intestinal obstruction or pneumoperitoneum. There is no hydronephrosis. There is wall thickening in the stomach and small bowel loops. There is fluid in the dilated small bowel loops. There is fluid in the lumen of colon. There is minimal ascites. Findings suggest possible gastritis and enterocolitis. Electronically Signed   By: Elmer Picker M.D.   On: 09/12/2021 10:10   DG Chest 1 View  Result Date: 09/11/2021 CLINICAL DATA:  [id number] EXAM: CHEST  1 VIEW COMPARISON:  None Available. FINDINGS: Lungs are well expanded, symmetric, and clear. No pneumothorax or pleural effusion. Cardiac size within normal limits. Pulmonary vascularity is normal. Osseous structures are age-appropriate. No acute bone abnormality. IMPRESSION: No active disease. Electronically Signed   By: Fidela Salisbury M.D.   On: 09/11/2021 23:19     Medications:    potassium chloride     sodium bicarbonate 150 mEq in D5W infusion      brimonidine  1 drop Both Eyes Daily   Difluprednate  1 drop Ophthalmic Daily   doxepin  50 mg Oral QHS   DULoxetine  40 mg Oral q AM   enoxaparin (LOVENOX) injection  30 mg Subcutaneous Q24H   famotidine  20 mg Oral QHS   leflunomide  20 mg Oral Daily   loratadine  10 mg Oral  Daily   melatonin  5 mg Oral QHS   [START ON 09/19/2021] mometasone-formoterol  2 puff Inhalation BID   neomycin-polymyxin b-dexamethasone  1 drop Both Eyes Q6H   oxybutynin  10 mg Oral Q1500   pantoprazole  40 mg Oral Daily   pentosan polysulfate  100 mg Oral QHS   Plecanatide  3 mg Oral Q0600   potassium chloride SA  40 mEq Oral BID   prednisoLONE acetate  1 drop Both Eyes QID   psyllium  1 packet Oral Daily   sodium citrate-citric acid  15 mL Oral BID   tamsulosin  0.4 mg Oral QHS   theophylline  400 mg Oral BID   thiamine  50 mg Oral Daily   tiotropium  1 capsule Inhalation Daily   acetaminophen **OR** acetaminophen, albuterol, fluocinonide cream, fluticasone, magnesium hydroxide, traMADol, traZODone, Urelle  Assessment/ Plan:  Katherine Perez is a 51 y.o.  female with past medical conditions including Raynauds disease, asthma, anemia, GERD, peripheral neuropathy, and chronic kidney diease stage 3b. Patient presents to emergency department with complaints of weakness,  altered mental status and lethargy. She is being admitted for Metabolic acidosis [R67.42] AKI (acute kidney injury) ([place]) [N17.9]   Acute Kidney Injury with hypokalmia/hyponatremia on chronic kidney disease stage IIIb with baseline creatinine 1.89 and GFR of 32 on 08/08/21.  Acute kidney injury secondary to hypovolemia from nausea, vomiting and diarrhea.  Chronic kidney disease is secondary to multiple acute kidney injuries CT abd pelvis shows no obstruction. No exposure to IV contrast. Agree with IVF for hydration. Potassium 2.9. Will order potassium chloride 36meq IV. Sodium 132, slowly correcting with IVF. Will continue to monitor. Recommend avoiding nephrotoxic agents and therapies.    Lab Results  Component Value Date   CREATININE 2.72 (H) 09/12/2021   CREATININE 3.05 (H) 09/11/2021   CREATININE 0.91 05/16/2021   No intake or output data in the 24 hours ending 09/12/21 1252  2. Acute  metabolic acidosis. Bicarb 8 today. Will change IVF to Sodium bicarbonate 158ml/hr.   3. Anemia of chronic kidney disease Lab Results  Component Value Date   HGB 10.2 (L) 09/12/2021    Hgb at goal    LOS: 1 Shavonte Zhao 5/23/202312:52 PM

## 2021-09-12 NOTE — Assessment & Plan Note (Signed)
-   We will continue Neurontin. ?

## 2021-09-12 NOTE — Assessment & Plan Note (Signed)
-  We will replace potassium and check magnesium level. 

## 2021-09-12 NOTE — Progress Notes (Signed)
Anticoagulation monitoring(Lovenox):  51 yo female ordered Lovenox 40 mg Q24h    Filed Weights   09/11/21 1351  Weight: 61.2 kg (135 lb)   BMI 19.9    Lab Results  Component Value Date   CREATININE 3.05 (H) 09/11/2021   CREATININE 0.91 05/16/2021   CREATININE 1.91 (H) 05/15/2021   Estimated Creatinine Clearance: 21.6 mL/min (A) (by C-G formula based on SCr of 3.05 mg/dL (H)). Hemoglobin & Hematocrit     Component Value Date/Time   HGB 11.7 (L) 09/11/2021 1353   HCT 35.8 (L) 09/11/2021 1353     Per Protocol for Patient with estCrcl < 30 ml/min and BMI < 30, will transition to Lovenox 30 mg Q24h.

## 2021-09-12 NOTE — Assessment & Plan Note (Signed)
-   This is clearly secondary to uremia with acute kidney injury. - We will follow neurochecks with hydration.

## 2021-09-12 NOTE — Assessment & Plan Note (Signed)
-   We will continue her inhalers and theophylline.

## 2021-09-12 NOTE — Progress Notes (Signed)
PROGRESS NOTE    Katherine Perez  YNW:[id number] DOB: December 17, 1970 DOA: 09/11/2021 PCP: Gladstone Lighter, MD    Brief Narrative:  51 y.o. female with medical history significant for GERD, peripheral neuropathy, Raynaud's disease, asthma and anemia, who presented to the ER with acute onset of altered mental status with confusion and lethargy.  She admitted to having diarrhea for the last couple days with loose bowel movements.  She admitted to nausea and vomiting once with no melena protruding per rectum.  She denied any bilious vomitus or hematemesis or abdominal pain.  She has mild cough with expectoration of clear mucus.  No dyspnea or wheezing.  No fever or chills.  No chest pain or palpitations.  She admits to urinary urgency with no hematuria or dysuria.  Patient has persistent diarrhea and mental status does demonstrate some confusion.  Patient unable to provide full history.   Assessment & Plan:   Principal Problem:   AKI (acute kidney injury) ([place]) Active Problems:   Acute metabolic encephalopathy   Hypokalemia   Acute metabolic acidosis   Hyponatremia   Asthma, chronic   Peripheral neuropathy   GERD without esophagitis  * AKI (acute kidney injury) ([place]) Suspect prerenal azotemia in the setting of intractable nausea and vomiting chronic diarrhea. Plan: Nephrology consult Supplemental IV fluids, switch to bicarbonate Daily renal function   Hypokalemia Hyponatremia Suspect secondary to chronic severe diarrhea Aggressively replace potassium   Acute metabolic encephalopathy Suspect secondary to uremia also potentially volume depletion Correct electrolytes monitor mental status    Acute metabolic acidosis Severe acidosis with bicarbonate of 8 on BMP Nephrology following Started on IV bicarbonate infusion    Asthma, chronic PTA bronchodilators and theophylline   Peripheral neuropathy PTA Neurontin   GERD without esophagitis PTA H2  blocker   DVT prophylaxis: Lovenox Code Status: DNR Family Communication: Council Mechanic [phone] on 5/23 Disposition Plan: Status is: Inpatient Remains inpatient appropriate because: Severe acute renal failure.  Severe metabolic acidosis   Level of care: Telemetry Medical  Consultants:  \Nephrology  Procedures:  None  Antimicrobials: None   Subjective: Seen and examined.  Pleasant.  Follows commands.  Difficult to provide history.  Objective: Vitals:   09/12/21 0500 09/12/21 0600 09/12/21 1100 09/12/21 1223  BP:  129/61 122/70 135/72  Pulse:  87 88 86  Resp: 15 15 16 16   Temp:   98.7 F (37.1 C) 98.3 F (36.8 C)  TempSrc:   Oral   SpO2:  92% 98% 100%  Weight:      Height:        Intake/Output Summary (Last 24 hours) at 09/12/2021 1453 Last data filed at 09/12/2021 1240 Gross per 24 hour  Intake 0 ml  Output --  Net 0 ml   Filed Weights   09/11/21 1351  Weight: 61.2 kg    Examination:  General exam: NAD.  Confused Respiratory system: Lungs clear.  Normal work of breathing.  Room air Cardiovascular system: S1-S2, RRR, no murmurs, no pedal edema Gastrointestinal system: Thin/scaphoid, soft, NT/ND, hypoactive bowel sounds Central nervous system: Alert.  Oriented x2.  No focal deficits Extremities: Symmetric 5 x 5 power. Skin: No rashes, lesions or ulcers Psychiatry: Judgement and insight appear impaired. Mood & affect flattened.     Data Reviewed: I have personally reviewed following labs and imaging studies  CBC: Recent Labs  Lab 09/11/21 1353 09/12/21 0307  WBC 13.4* 12.0*  HGB 11.7* 10.2*  HCT 35.8* 31.0*  MCV 90.4 90.9  PLT  388 300   Basic Metabolic Panel: Recent Labs  Lab 09/11/21 1353 09/11/21 1553 09/12/21 0307  NA 129*  --  132*  K 3.4*  --  2.9*  CL 105  --  114*  CO2 9*  --  8*  GLUCOSE 83  --  86  BUN 82*  --  78*  CREATININE 3.05*  --  2.72*  CALCIUM 8.5*  --  8.0*  MG  --  2.3  --   PHOS  --  4.0  --     GFR: Estimated Creatinine Clearance: 24.2 mL/min (A) (by C-G formula based on SCr of 2.72 mg/dL (H)). Liver Function Tests: No results for input(s): AST, ALT, ALKPHOS, BILITOT, PROT, ALBUMIN in the last 168 hours. No results for input(s): LIPASE, AMYLASE in the last 168 hours. No results for input(s): AMMONIA in the last 168 hours. Coagulation Profile: No results for input(s): INR, PROTIME in the last 168 hours. Cardiac Enzymes: No results for input(s): CKTOTAL, CKMB, CKMBINDEX, TROPONINI in the last 168 hours. BNP (last 3 results) No results for input(s): PROBNP in the last 8760 hours. HbA1C: No results for input(s): HGBA1C in the last 72 hours. CBG: No results for input(s): GLUCAP in the last 168 hours. Lipid Profile: No results for input(s): CHOL, HDL, LDLCALC, TRIG, CHOLHDL, LDLDIRECT in the last 72 hours. Thyroid Function Tests: No results for input(s): TSH, T4TOTAL, FREET4, T3FREE, THYROIDAB in the last 72 hours. Anemia Panel: No results for input(s): VITAMINB12, FOLATE, FERRITIN, TIBC, IRON, RETICCTPCT in the last 72 hours. Sepsis Labs: No results for input(s): PROCALCITON, LATICACIDVEN in the last 168 hours.  Recent Results (from the past 240 hour(s))  Resp Panel by RT-PCR (Flu A&B, Covid) Nasopharyngeal Swab     Status: None   Collection Time: 09/11/21  9:55 PM   Specimen: Nasopharyngeal Swab; Nasopharyngeal(NP) swabs in vial transport medium  Result Value Ref Range Status   SARS Coronavirus 2 by RT PCR NEGATIVE NEGATIVE Final    Comment: (NOTE) SARS-CoV-2 target nucleic acids are NOT DETECTED.  The SARS-CoV-2 RNA is generally detectable in upper respiratory specimens during the acute phase of infection. The lowest concentration of SARS-CoV-2 viral copies this assay can detect is 138 copies/mL. A negative result does not preclude SARS-Cov-2 infection and should not be used as the sole basis for treatment or other patient management decisions. A negative result may  occur with  improper specimen collection/handling, submission of specimen other than nasopharyngeal swab, presence of viral mutation(s) within the areas targeted by this assay, and inadequate number of viral copies(<138 copies/mL). A negative result must be combined with clinical observations, patient history, and epidemiological information. The expected result is Negative.  Fact Sheet for Patients:  EntrepreneurPulse.com.au  Fact Sheet for Healthcare Providers:  IncredibleEmployment.be  This test is no t yet approved or cleared by the [place] FDA and  has been authorized for detection and/or diagnosis of SARS-CoV-2 by FDA under an Emergency Use Authorization (EUA). This EUA will remain  in effect (meaning this test can be used) for the duration of the COVID-19 declaration under Section 564(b)(1) of the Act, 21 U.S.C.section 360bbb-3(b)(1), unless the authorization is terminated  or revoked sooner.       Influenza A by PCR NEGATIVE NEGATIVE Final   Influenza B by PCR NEGATIVE NEGATIVE Final    Comment: (NOTE) The Xpert Xpress SARS-CoV-2/FLU/RSV plus assay is intended as an aid in the diagnosis of influenza from Nasopharyngeal swab specimens and should not be used as  a sole basis for treatment. Nasal washings and aspirates are unacceptable for Xpert Xpress SARS-CoV-2/FLU/RSV testing.  Fact Sheet for Patients: EntrepreneurPulse.com.au  Fact Sheet for Healthcare Providers: IncredibleEmployment.be  This test is not yet approved or cleared by the [place] FDA and has been authorized for detection and/or diagnosis of SARS-CoV-2 by FDA under an Emergency Use Authorization (EUA). This EUA will remain in effect (meaning this test can be used) for the duration of the COVID-19 declaration under Section 564(b)(1) of the Act, 21 U.S.C. section 360bbb-3(b)(1), unless the authorization is terminated  or revoked.  Performed at [facility], [address] [place], [place] [address]          Radiology Studies: CT ABDOMEN PELVIS WO CONTRAST  Result Date: 09/12/2021 CLINICAL DATA:  Abdominal pain, nausea, vomiting EXAM: CT ABDOMEN AND PELVIS WITHOUT CONTRAST TECHNIQUE: Multidetector CT imaging of the abdomen and pelvis was performed following the standard protocol without IV contrast. RADIATION DOSE REDUCTION: This exam was performed according to the departmental dose-optimization program which includes automated exposure control, adjustment of the mA and/or kV according to patient size and/or use of iterative reconstruction technique. COMPARISON:  07/04/2020 FINDINGS: Lower chest: Small linear densities in the posterior lower lung fields may suggest scarring or subsegmental atelectasis. Breathing motion artifacts limit evaluation of lung fields. Minimal pleural effusions are seen. Scattered coronary artery calcifications are seen. Hepatobiliary: There is coarse calcification in the right lobe with no significant interval change. There is no significant dilation of bile ducts. Gallbladder is not seen. Pancreas: No focal abnormality is seen. Spleen: Unremarkable. Adrenals/Urinary Tract: Adrenals are unremarkable. There is no hydronephrosis. There are tiny calcific densities in the renal cortex in both kidneys. No definite renal stones are seen. Ureters are not dilated. Urinary bladder is distended. There is no wall thickening in the bladder. Stomach/Bowel: Stomach is moderately distended. There is wall thickening in the stomach. Duodenum is distended. There is mild to moderate dilation of small-bowel loops. There is mild diffuse wall thickening in the small bowel loops. Appendix is not dilated. There is high density in the lumen of appendix. There is no focal pericecal inflammation. There is fluid in the lumen of colon. There is gaseous distention of transverse colon. There is no focal wall  thickening in the colon. Vascular/Lymphatic: Calcifications are seen in aorta and its major branches. Reproductive: Uterus is difficult to visualize. No dominant adnexal masses are seen. Other: There is no pneumoperitoneum. There is minimal ascites in the pelvis. Musculoskeletal: There is minimal anterolisthesis at L4-L5 level along with disc space narrowing. IMPRESSION: There is no evidence of intestinal obstruction or pneumoperitoneum. There is no hydronephrosis. There is wall thickening in the stomach and small bowel loops. There is fluid in the dilated small bowel loops. There is fluid in the lumen of colon. There is minimal ascites. Findings suggest possible gastritis and enterocolitis. Electronically Signed   By: Elmer Picker M.D.   On: 09/12/2021 10:10   DG Chest 1 View  Result Date: 09/11/2021 CLINICAL DATA:  [id number] EXAM: CHEST  1 VIEW COMPARISON:  None Available. FINDINGS: Lungs are well expanded, symmetric, and clear. No pneumothorax or pleural effusion. Cardiac size within normal limits. Pulmonary vascularity is normal. Osseous structures are age-appropriate. No acute bone abnormality. IMPRESSION: No active disease. Electronically Signed   By: Fidela Salisbury M.D.   On: 09/11/2021 23:19        Scheduled Meds:  brimonidine  1 drop Both Eyes Daily   Difluprednate  1 drop  Ophthalmic Daily   doxepin  50 mg Oral QHS   DULoxetine  40 mg Oral q AM   enoxaparin (LOVENOX) injection  30 mg Subcutaneous Q24H   famotidine  20 mg Oral QHS   leflunomide  20 mg Oral Daily   loratadine  10 mg Oral Daily   melatonin  5 mg Oral QHS   [START ON 09/19/2021] mometasone-formoterol  2 puff Inhalation BID   neomycin-polymyxin b-dexamethasone  1 drop Both Eyes Q6H   oxybutynin  10 mg Oral Q1500   pantoprazole  40 mg Oral Daily   pentosan polysulfate  100 mg Oral QHS   Plecanatide  3 mg Oral Q0600   potassium chloride SA  40 mEq Oral BID   prednisoLONE acetate  1 drop Both Eyes QID   psyllium  1  packet Oral Daily   sodium citrate-citric acid  15 mL Oral BID   tamsulosin  0.4 mg Oral QHS   theophylline  400 mg Oral BID   thiamine  50 mg Oral Daily   tiotropium  1 capsule Inhalation Daily   Continuous Infusions:  potassium chloride 10 mEq (09/12/21 1358)   sodium bicarbonate 150 mEq in D5W infusion 100 mL/hr at 09/12/21 1352     LOS: 1 day      Sidney Ace, MD [facility]   If 7PM-7AM, please contact night-coverage  09/12/2021, 2:53 PM

## 2021-09-13 DIAGNOSIS — R630 Anorexia: Secondary | ICD-10-CM

## 2021-09-13 DIAGNOSIS — N179 Acute kidney failure, unspecified: Secondary | ICD-10-CM | POA: Diagnosis not present

## 2021-09-13 DIAGNOSIS — K3184 Gastroparesis: Secondary | ICD-10-CM

## 2021-09-13 LAB — BASIC METABOLIC PANEL
Anion gap: 8 (ref 5–15)
BUN: 65 mg/dL — ABNORMAL HIGH (ref 6–20)
CO2: 13 mmol/L — ABNORMAL LOW (ref 22–32)
Calcium: 7.7 mg/dL — ABNORMAL LOW (ref 8.9–10.3)
Chloride: 111 mmol/L (ref 98–111)
Creatinine, Ser: 2.37 mg/dL — ABNORMAL HIGH (ref 0.44–1.00)
GFR, Estimated: 25 mL/min — ABNORMAL LOW (ref >60)
Glucose, Bld: 90 mg/dL (ref 70–99)
Potassium: 4.1 mmol/L (ref 3.5–5.1)
Sodium: 132 mmol/L — ABNORMAL LOW (ref 135–145)

## 2021-09-13 MED ORDER — POTASSIUM CHLORIDE 20 MEQ PO PACK
20.0000 meq | PACK | Freq: Two times a day (BID) | ORAL | Status: DC
  Administered 2021-09-13 – 2021-09-14 (×3): 20 meq via ORAL
  Filled 2021-09-13 (×3): qty 1

## 2021-09-13 MED ORDER — POTASSIUM CHLORIDE 20 MEQ PO PACK
40.0000 meq | PACK | Freq: Once | ORAL | Status: AC
  Administered 2021-09-13: 40 meq via ORAL
  Filled 2021-09-13: qty 2

## 2021-09-13 NOTE — TOC Initial Note (Signed)
Transition of Care [facility]) - Initial/Assessment Note    Patient Details  Name: Katherine Perez MRN: [id number] Date of Birth: 07/15/1970  [facility]) CM/SW Contact:    [name], LCSW Phone Number: 09/13/2021, 2:07 PM  Clinical Narrative:  Readmission prevention screen complete. CSW met with patient. No supports at bedside. CSW introduced role and explained that discharge planning would be discussed. PCP is Dr. Tressia Miners. Patient or her husband drives to appointments. She uses [facility]. No issues obtaining medications. No home health prior to admission. She goes to outpatient therapy at [facility] in [place] three times per week. Patient uses a rollator to ambulate. She also has a shower chair. No further concerns. CSW encouraged patient to contact CSW as needed. CSW will continue to follow patient for support and facilitate return home once stable. Her husband will transport her home at discharge.                Expected Discharge Plan: Home/Self Care Barriers to Discharge: Continued Medical Work up   Patient Goals and CMS Choice        Expected Discharge Plan and Services Expected Discharge Plan: Home/Self Care     Post Acute Care Choice: Resumption of Svcs/PTA Provider Living arrangements for the past 2 months: Single Family Home                                      Prior Living Arrangements/Services Living arrangements for the past 2 months: Single Family Home Lives with:: Spouse Patient language and need for interpreter reviewed:: Yes Do you feel safe going back to the place where you live?: Yes      Need for Family Participation in Patient Care: Yes (Comment) Care giver support system in place?: Yes (comment) Current home services: DME Criminal Activity/Legal Involvement Pertinent to Current Situation/Hospitalization: No - Comment as needed  Activities of Daily Living Home Assistive Devices/Equipment: Gilford Rile (specify  type) ADL Screening (condition at time of admission) Patient's cognitive ability adequate to safely complete daily activities?: Yes Is the patient deaf or have difficulty hearing?: No Does the patient have difficulty seeing, even when wearing glasses/contacts?: No Does the patient have difficulty concentrating, remembering, or making decisions?: No Patient able to express need for assistance with ADLs?: Yes Does the patient have difficulty dressing or bathing?: No Independently performs ADLs?: Yes (appropriate for developmental age) Does the patient have difficulty walking or climbing stairs?: No Weakness of Legs: Both Weakness of Arms/Hands: None  Permission Sought/Granted                  Emotional Assessment Appearance:: Appears stated age Attitude/Demeanor/Rapport: Engaged, Gracious Affect (typically observed): Accepting, Appropriate, Calm, Pleasant Orientation: : Oriented to Self, Oriented to Place, Oriented to  Time, Oriented to Situation Alcohol / Substance Use: Not Applicable Psych Involvement: No (comment)  Admission diagnosis:  Metabolic acidosis [Y60.63] AKI (acute kidney injury) ([place]) [N17.9] Patient Active Problem List   Diagnosis Date Noted   Gastroparesis 09/13/2021   Anorexia 01/60/1093   Acute metabolic acidosis 23/55/7322   Asthma, chronic 09/12/2021   Peripheral neuropathy 09/12/2021   Decubitus ulcer of coccyx, stage 2 ([place]) 05/15/2021   Neuropathy    Chronic constipation    Rheumatoid arthritis involving multiple sites with positive rheumatoid factor ([place]) 10/27/2020   Encounter for adjustment or removal of myringotomy device (stent) (tube)    IDA (iron  deficiency anemia) 10/07/2020   AKI (acute kidney injury) ([place]) 18/86/7737   Metabolic acidosis 36/68/1594   Other specified diseases of biliary tract    Disease of biliary tract, unspecified    Bile leak 06/30/2020   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   Hyperkalemia 06/27/2020   CKD (chronic  kidney disease), stage IV ([place]) 06/27/2020   Acute pancreatitis without infection or necrosis 04/23/2020   GERD without esophagitis 04/23/2020   Hypokalemia 04/23/2020   Prolonged QT interval 04/23/2020   Hypercalcemia    Pressure injury of skin 10/27/2019   Hyponatremia 10/26/2019   Increased anion gap metabolic acidosis 70/76/1518   Acute renal failure superimposed on stage 4 chronic kidney disease ([place]) 34/37/3578   Acute metabolic encephalopathy 97/84/7841   Leukocytosis 10/26/2019   Slow transit constipation 09/03/2018   Laxative habit 12/31/2014   Chronic interstitial cystitis 11/18/2014   PCP:  Gladstone Lighter, MD Pharmacy:   [facility] [id number] - [address], [place] [address] Phone: [phone] Fax: [phone]  [id number] [place], [place] [address] AT [address] [place] [address] Phone: [phone] Fax: [phone]     Social Determinants of Health ([place]) Interventions    Readmission Risk Interventions    09/13/2021    2:06 PM  Readmission Risk Prevention Plan  Transportation Screening Complete  Medication Review ([place]) Complete  PCP or Specialist appointment within 3-5 days of discharge Complete  SW Recovery Care/Counseling Consult Complete  [place] Not Applicable

## 2021-09-13 NOTE — Progress Notes (Addendum)
PROGRESS NOTE    Katherine Perez  PIR:[id number] DOB: May 20, 1970 DOA: 09/11/2021 PCP: Gladstone Lighter, MD    Brief Narrative:  51 y.o. female with medical history significant for GERD, peripheral neuropathy, Raynaud's disease, asthma and anemia, who presented to the ER with acute onset of altered mental status with confusion and lethargy.  She admitted to having diarrhea for the last couple days with loose bowel movements.  She admitted to nausea and vomiting once with no melena protruding per rectum.  She denied any bilious vomitus or hematemesis or abdominal pain.  She has mild cough with expectoration of clear mucus.  No dyspnea or wheezing.  No fever or chills.  No chest pain or palpitations.  She admits to urinary urgency with no hematuria or dysuria.    Assessment & Plan:   Principal Problem:   AKI (acute kidney injury) ([place]) Active Problems:   Acute metabolic encephalopathy   Hypokalemia   Acute metabolic acidosis   Hyponatremia   Asthma, chronic   Peripheral neuropathy   GERD without esophagitis   Rheumatoid arthritis involving multiple sites with positive rheumatoid factor (HCC)   Chronic constipation   Gastroparesis   Anorexia  Anorexia Suspect this is the primary problem and that all other problems are downstream consequence of this problem and things she has done ( such as chronic use of stimulant laxitives) in service of this problem. Discussed this with her husband and he is in agreement. - psych consult - RD consult  AKI (acute kidney injury) ([place]) Suspect prerenal azotemia in the setting of decreased PO. Has had similar such presentations in the past Plan: Nephrology consulted Supplemental IV fluids - IV bicarb Daily renal function   Hypokalemia Hyponatremia Suspect secondary to chronic reduced po Aggressively replace potassium   Acute metabolic encephalopathy Suspect secondary to uremia also potentially volume depletion Correct  electrolytes monitor mental status    Acute metabolic acidosis Severe acidosis with bicarbonate of 8 on BMP Nephrology following Started on IV bicarbonate infusion  Asthma, chronic PTA bronchodilators and theophylline   Peripheral neuropathy PTA Neurontin   GERD without esophagitis PTA H2 blocker   DVT prophylaxis: Lovenox Code Status: DNR Family Communication: husband updated telephonically 5/24 Disposition Plan: Status is: Inpatient Remains inpatient appropriate because: Severe acute renal failure.  Severe metabolic acidosis   Level of care: Med-Surg  Consultants:  Nephrology  Procedures:  None  Antimicrobials: None   Subjective: Upset when examined.  Objective: Vitals:   09/12/21 1223 09/12/21 1956 09/13/21 0605 09/13/21 0833  BP: 135/72 130/63 108/63 118/66  Pulse: 86 92 83 85  Resp: 16 18 16 16   Temp: 98.3 F (36.8 C) 98.7 F (37.1 C) 98.4 F (36.9 C) 98.2 F (36.8 C)  TempSrc:  Oral    SpO2: 100% 100% 98% 98%  Weight:      Height:        Intake/Output Summary (Last 24 hours) at 09/13/2021 1250 Last data filed at 09/12/2021 2000 Gross per 24 hour  Intake --  Output 600 ml  Net -600 ml   Filed Weights   09/11/21 1351  Weight: 61.2 kg    Examination:  General exam: NAD.   Respiratory system: Lungs clear.  Normal work of breathing.  Room air Cardiovascular system: S1-S2, RRR, no murmurs, no pedal edema Gastrointestinal system: Thin/scaphoid, soft, NT/ND, hypoactive bowel sounds Central nervous system: Alert.  Oriented x2.  No focal deficits Extremities: Symmetric 5 x 5 power. Skin: No rashes, lesions or ulcers Psychiatry:  Judgement and insight appear impaired. Mood & affect flattened.     Data Reviewed:   CBC: Recent Labs  Lab 09/11/21 1353 09/12/21 0307  WBC 13.4* 12.0*  HGB 11.7* 10.2*  HCT 35.8* 31.0*  MCV 90.4 90.9  PLT 388 443   Basic Metabolic Panel: Recent Labs  Lab 09/11/21 1353 09/11/21 1553 09/12/21 0307  09/12/21 2308 09/13/21 0342  NA 129*  --  132* 130* 132*  K 3.4*  --  2.9* 3.1* 4.1  CL 105  --  114* 110 111  CO2 9*  --  8* 11* 13*  GLUCOSE 83  --  86 138* 90  BUN 82*  --  78* 67* 65*  CREATININE 3.05*  --  2.72* 2.40* 2.37*  CALCIUM 8.5*  --  8.0* 7.9* 7.7*  MG  --  2.3  --  1.8  --   PHOS  --  4.0  --   --   --    GFR: Estimated Creatinine Clearance: 27.7 mL/min (A) (by C-G formula based on SCr of 2.37 mg/dL (H)). Liver Function Tests: Recent Labs  Lab 09/12/21 2308  AST 17  ALT 14  ALKPHOS 94  BILITOT 0.4  PROT 5.3*  ALBUMIN 2.6*   No results for input(s): LIPASE, AMYLASE in the last 168 hours. No results for input(s): AMMONIA in the last 168 hours. Coagulation Profile: No results for input(s): INR, PROTIME in the last 168 hours. Cardiac Enzymes: No results for input(s): CKTOTAL, CKMB, CKMBINDEX, TROPONINI in the last 168 hours. BNP (last 3 results) No results for input(s): PROBNP in the last 8760 hours. HbA1C: No results for input(s): HGBA1C in the last 72 hours. CBG: No results for input(s): GLUCAP in the last 168 hours. Lipid Profile: No results for input(s): CHOL, HDL, LDLCALC, TRIG, CHOLHDL, LDLDIRECT in the last 72 hours. Thyroid Function Tests: No results for input(s): TSH, T4TOTAL, FREET4, T3FREE, THYROIDAB in the last 72 hours. Anemia Panel: No results for input(s): VITAMINB12, FOLATE, FERRITIN, TIBC, IRON, RETICCTPCT in the last 72 hours. Sepsis Labs: No results for input(s): PROCALCITON, LATICACIDVEN in the last 168 hours.  Recent Results (from the past 240 hour(s))  Resp Panel by RT-PCR (Flu A&B, Covid) Nasopharyngeal Swab     Status: None   Collection Time: 09/11/21  9:55 PM   Specimen: Nasopharyngeal Swab; Nasopharyngeal(NP) swabs in vial transport medium  Result Value Ref Range Status   SARS Coronavirus 2 by RT PCR NEGATIVE NEGATIVE Final    Comment: (NOTE) SARS-CoV-2 target nucleic acids are NOT DETECTED.  The SARS-CoV-2 RNA is generally  detectable in upper respiratory specimens during the acute phase of infection. The lowest concentration of SARS-CoV-2 viral copies this assay can detect is 138 copies/mL. A negative result does not preclude SARS-Cov-2 infection and should not be used as the sole basis for treatment or other patient management decisions. A negative result may occur with  improper specimen collection/handling, submission of specimen other than nasopharyngeal swab, presence of viral mutation(s) within the areas targeted by this assay, and inadequate number of viral copies(<138 copies/mL). A negative result must be combined with clinical observations, patient history, and epidemiological information. The expected result is Negative.  Fact Sheet for Patients:  EntrepreneurPulse.com.au  Fact Sheet for Healthcare Providers:  IncredibleEmployment.be  This test is no t yet approved or cleared by the [place] FDA and  has been authorized for detection and/or diagnosis of SARS-CoV-2 by FDA under an Emergency Use Authorization (EUA). This EUA will remain  in  effect (meaning this test can be used) for the duration of the COVID-19 declaration under Section 564(b)(1) of the Act, 21 U.S.C.section 360bbb-3(b)(1), unless the authorization is terminated  or revoked sooner.       Influenza A by PCR NEGATIVE NEGATIVE Final   Influenza B by PCR NEGATIVE NEGATIVE Final    Comment: (NOTE) The Xpert Xpress SARS-CoV-2/FLU/RSV plus assay is intended as an aid in the diagnosis of influenza from Nasopharyngeal swab specimens and should not be used as a sole basis for treatment. Nasal washings and aspirates are unacceptable for Xpert Xpress SARS-CoV-2/FLU/RSV testing.  Fact Sheet for Patients: EntrepreneurPulse.com.au  Fact Sheet for Healthcare Providers: IncredibleEmployment.be  This test is not yet approved or cleared by the [place] FDA  and has been authorized for detection and/or diagnosis of SARS-CoV-2 by FDA under an Emergency Use Authorization (EUA). This EUA will remain in effect (meaning this test can be used) for the duration of the COVID-19 declaration under Section 564(b)(1) of the Act, 21 U.S.C. section 360bbb-3(b)(1), unless the authorization is terminated or revoked.  Performed at [facility], [address] [place], [place] [address]          Radiology Studies: CT ABDOMEN PELVIS WO CONTRAST  Result Date: 09/12/2021 CLINICAL DATA:  Abdominal pain, nausea, vomiting EXAM: CT ABDOMEN AND PELVIS WITHOUT CONTRAST TECHNIQUE: Multidetector CT imaging of the abdomen and pelvis was performed following the standard protocol without IV contrast. RADIATION DOSE REDUCTION: This exam was performed according to the departmental dose-optimization program which includes automated exposure control, adjustment of the mA and/or kV according to patient size and/or use of iterative reconstruction technique. COMPARISON:  07/04/2020 FINDINGS: Lower chest: Small linear densities in the posterior lower lung fields may suggest scarring or subsegmental atelectasis. Breathing motion artifacts limit evaluation of lung fields. Minimal pleural effusions are seen. Scattered coronary artery calcifications are seen. Hepatobiliary: There is coarse calcification in the right lobe with no significant interval change. There is no significant dilation of bile ducts. Gallbladder is not seen. Pancreas: No focal abnormality is seen. Spleen: Unremarkable. Adrenals/Urinary Tract: Adrenals are unremarkable. There is no hydronephrosis. There are tiny calcific densities in the renal cortex in both kidneys. No definite renal stones are seen. Ureters are not dilated. Urinary bladder is distended. There is no wall thickening in the bladder. Stomach/Bowel: Stomach is moderately distended. There is wall thickening in the stomach. Duodenum is distended.  There is mild to moderate dilation of small-bowel loops. There is mild diffuse wall thickening in the small bowel loops. Appendix is not dilated. There is high density in the lumen of appendix. There is no focal pericecal inflammation. There is fluid in the lumen of colon. There is gaseous distention of transverse colon. There is no focal wall thickening in the colon. Vascular/Lymphatic: Calcifications are seen in aorta and its major branches. Reproductive: Uterus is difficult to visualize. No dominant adnexal masses are seen. Other: There is no pneumoperitoneum. There is minimal ascites in the pelvis. Musculoskeletal: There is minimal anterolisthesis at L4-L5 level along with disc space narrowing. IMPRESSION: There is no evidence of intestinal obstruction or pneumoperitoneum. There is no hydronephrosis. There is wall thickening in the stomach and small bowel loops. There is fluid in the dilated small bowel loops. There is fluid in the lumen of colon. There is minimal ascites. Findings suggest possible gastritis and enterocolitis. Electronically Signed   By: Elmer Picker M.D.   On: 09/12/2021 10:10   DG Chest 1 View  Result Date:  09/11/2021 CLINICAL DATA:  [id number] EXAM: CHEST  1 VIEW COMPARISON:  None Available. FINDINGS: Lungs are well expanded, symmetric, and clear. No pneumothorax or pleural effusion. Cardiac size within normal limits. Pulmonary vascularity is normal. Osseous structures are age-appropriate. No acute bone abnormality. IMPRESSION: No active disease. Electronically Signed   By: Fidela Salisbury M.D.   On: 09/11/2021 23:19        Scheduled Meds:  brimonidine  1 drop Both Eyes Daily   doxepin  50 mg Oral QHS   DULoxetine  40 mg Oral q AM   enoxaparin (LOVENOX) injection  30 mg Subcutaneous Q24H   famotidine  20 mg Oral QHS   leflunomide  20 mg Oral Daily   loratadine  10 mg Oral Daily   melatonin  5 mg Oral QHS   [START ON 09/19/2021] mometasone-formoterol  2 puff Inhalation BID    neomycin-polymyxin b-dexamethasone  1 drop Both Eyes Q6H   oxybutynin  10 mg Oral Q1500   pantoprazole  40 mg Oral Daily   pentosan polysulfate  100 mg Oral QHS   potassium chloride  20 mEq Oral BID   prednisoLONE acetate  1 drop Both Eyes QID   psyllium  1 packet Oral Daily   sodium citrate-citric acid  15 mL Oral BID   tamsulosin  0.4 mg Oral QHS   theophylline  400 mg Oral BID   thiamine  50 mg Oral Daily   tiotropium  1 capsule Inhalation Daily   Continuous Infusions:  sodium bicarbonate 150 mEq in D5W infusion 125 mL/hr at 09/13/21 1036     LOS: 2 days      Desma Maxim, MD [facility]   If 7PM-7AM, please contact night-coverage  09/13/2021, 12:50 PM

## 2021-09-13 NOTE — Progress Notes (Addendum)
[facility]  ROUNDING NOTE   Subjective:   Katherine Perez is a 51 y.o. female with past medical conditions including Raynauds disease, asthma, anemia, GERD, peripheral neuropathy, and chronic kidney diease stage 3b. Patient presents to emergency department with complaints of weakness, altered mental status and lethargy. She is being admitted for Metabolic acidosis [H63.14] AKI (acute kidney injury) [facility]) [N17.9]  Patient is known to our practice and is followed by Dr Holley Raring outpatient. Her last office visit was on 08/02/21.   Update: Patient sitting up in bed, eating breakfast Husband at bedside States she feels much better today Appetite improving, no nausea or vomiting since admission Denies diarrhea since admission Mild stomach discomfort  Bicarb13 Sodium 132 Potassium 4.1  Objective:  Vital signs in last 24 hours:  Temp:  [98.2 F (36.8 C)-98.7 F (37.1 C)] 98.2 F (36.8 C) (05/24 0833) Pulse Rate:  [83-92] 85 (05/24 0833) Resp:  [16-18] 16 (05/24 0833) BP: (108-135)/(63-72) 118/66 (05/24 0833) SpO2:  [98 %-100 %] 98 % (05/24 0833)  Weight change:  Filed Weights   09/11/21 1351  Weight: 61.2 kg    Intake/Output: I/O last 3 completed shifts: In: 0  Out: 600 [Urine:600]   Intake/Output this shift:  No intake/output data recorded.  Physical Exam: General: NAD   Head: Normocephalic, atraumatic.  Dry oral mucosal membranes  Eyes: Anicteric  Lungs:  Clear to auscultation, normal effort, room air  Heart: Regular rate and rhythm  Abdomen:  Soft, nontender  Extremities: No peripheral edema.  Neurologic: Nonfocal, moving all four extremities  Skin: No lesions  Access: None    Basic Metabolic Panel: Recent Labs  Lab 09/11/21 1353 09/11/21 1553 09/12/21 0307 09/12/21 2308 09/13/21 0342  NA 129*  --  132* 130* 132*  K 3.4*  --  2.9* 3.1* 4.1  CL 105  --  114* 110 111  CO2 9*  --  8* 11* 13*  GLUCOSE 83  --  86 138* 90  BUN  82*  --  78* 67* 65*  CREATININE 3.05*  --  2.72* 2.40* 2.37*  CALCIUM 8.5*  --  8.0* 7.9* 7.7*  MG  --  2.3  --  1.8  --   PHOS  --  4.0  --   --   --      Liver Function Tests: Recent Labs  Lab 09/12/21 2308  AST 17  ALT 14  ALKPHOS 94  BILITOT 0.4  PROT 5.3*  ALBUMIN 2.6*   No results for input(s): LIPASE, AMYLASE in the last 168 hours. No results for input(s): AMMONIA in the last 168 hours.  CBC: Recent Labs  Lab 09/11/21 1353 09/12/21 0307  WBC 13.4* 12.0*  HGB 11.7* 10.2*  HCT 35.8* 31.0*  MCV 90.4 90.9  PLT 388 326     Cardiac Enzymes: No results for input(s): CKTOTAL, CKMB, CKMBINDEX, TROPONINI in the last 168 hours.  BNP: Invalid input(s): POCBNP  CBG: No results for input(s): GLUCAP in the last 168 hours.  Microbiology: Results for orders placed or performed during the hospital encounter of 09/11/21  Resp Panel by RT-PCR (Flu A&B, Covid) Nasopharyngeal Swab     Status: None   Collection Time: 09/11/21  9:55 PM   Specimen: Nasopharyngeal Swab; Nasopharyngeal(NP) swabs in vial transport medium  Result Value Ref Range Status   SARS Coronavirus 2 by RT PCR NEGATIVE NEGATIVE Final    Comment: (NOTE) SARS-CoV-2 target nucleic acids are NOT DETECTED.  The SARS-CoV-2 RNA is  generally detectable in upper respiratory specimens during the acute phase of infection. The lowest concentration of SARS-CoV-2 viral copies this assay can detect is 138 copies/mL. A negative result does not preclude SARS-Cov-2 infection and should not be used as the sole basis for treatment or other patient management decisions. A negative result may occur with  improper specimen collection/handling, submission of specimen other than nasopharyngeal swab, presence of viral mutation(s) within the areas targeted by this assay, and inadequate number of viral copies(<138 copies/mL). A negative result must be combined with clinical observations, patient history, and  epidemiological information. The expected result is Negative.  Fact Sheet for Patients:  EntrepreneurPulse.com.au  Fact Sheet for Healthcare Providers:  IncredibleEmployment.be  This test is no t yet approved or cleared by the [place] FDA and  has been authorized for detection and/or diagnosis of SARS-CoV-2 by FDA under an Emergency Use Authorization (EUA). This EUA will remain  in effect (meaning this test can be used) for the duration of the COVID-19 declaration under Section 564(b)(1) of the Act, 21 U.S.C.section 360bbb-3(b)(1), unless the authorization is terminated  or revoked sooner.       Influenza A by PCR NEGATIVE NEGATIVE Final   Influenza B by PCR NEGATIVE NEGATIVE Final    Comment: (NOTE) The Xpert Xpress SARS-CoV-2/FLU/RSV plus assay is intended as an aid in the diagnosis of influenza from Nasopharyngeal swab specimens and should not be used as a sole basis for treatment. Nasal washings and aspirates are unacceptable for Xpert Xpress SARS-CoV-2/FLU/RSV testing.  Fact Sheet for Patients: EntrepreneurPulse.com.au  Fact Sheet for Healthcare Providers: IncredibleEmployment.be  This test is not yet approved or cleared by the [place] FDA and has been authorized for detection and/or diagnosis of SARS-CoV-2 by FDA under an Emergency Use Authorization (EUA). This EUA will remain in effect (meaning this test can be used) for the duration of the COVID-19 declaration under Section 564(b)(1) of the Act, 21 U.S.C. section 360bbb-3(b)(1), unless the authorization is terminated or revoked.  Performed at [facility], [address] [place], [place] [address]     Coagulation Studies: No results for input(s): LABPROT, INR in the last 72 hours.  Urinalysis: Recent Labs    09/12/21 0243  COLORURINE YELLOW*  LABSPEC 1.008  PHURINE 6.0  GLUCOSEU NEGATIVE  HGBUR MODERATE*   BILIRUBINUR NEGATIVE  KETONESUR NEGATIVE  PROTEINUR 100*  NITRITE NEGATIVE  LEUKOCYTESUR LARGE*       Imaging: CT ABDOMEN PELVIS WO CONTRAST  Result Date: 09/12/2021 CLINICAL DATA:  Abdominal pain, nausea, vomiting EXAM: CT ABDOMEN AND PELVIS WITHOUT CONTRAST TECHNIQUE: Multidetector CT imaging of the abdomen and pelvis was performed following the standard protocol without IV contrast. RADIATION DOSE REDUCTION: This exam was performed according to the departmental dose-optimization program which includes automated exposure control, adjustment of the mA and/or kV according to patient size and/or use of iterative reconstruction technique. COMPARISON:  07/04/2020 FINDINGS: Lower chest: Small linear densities in the posterior lower lung fields may suggest scarring or subsegmental atelectasis. Breathing motion artifacts limit evaluation of lung fields. Minimal pleural effusions are seen. Scattered coronary artery calcifications are seen. Hepatobiliary: There is coarse calcification in the right lobe with no significant interval change. There is no significant dilation of bile ducts. Gallbladder is not seen. Pancreas: No focal abnormality is seen. Spleen: Unremarkable. Adrenals/Urinary Tract: Adrenals are unremarkable. There is no hydronephrosis. There are tiny calcific densities in the renal cortex in both kidneys. No definite renal stones are seen. Ureters are not dilated. Urinary  bladder is distended. There is no wall thickening in the bladder. Stomach/Bowel: Stomach is moderately distended. There is wall thickening in the stomach. Duodenum is distended. There is mild to moderate dilation of small-bowel loops. There is mild diffuse wall thickening in the small bowel loops. Appendix is not dilated. There is high density in the lumen of appendix. There is no focal pericecal inflammation. There is fluid in the lumen of colon. There is gaseous distention of transverse colon. There is no focal wall thickening  in the colon. Vascular/Lymphatic: Calcifications are seen in aorta and its major branches. Reproductive: Uterus is difficult to visualize. No dominant adnexal masses are seen. Other: There is no pneumoperitoneum. There is minimal ascites in the pelvis. Musculoskeletal: There is minimal anterolisthesis at L4-L5 level along with disc space narrowing. IMPRESSION: There is no evidence of intestinal obstruction or pneumoperitoneum. There is no hydronephrosis. There is wall thickening in the stomach and small bowel loops. There is fluid in the dilated small bowel loops. There is fluid in the lumen of colon. There is minimal ascites. Findings suggest possible gastritis and enterocolitis. Electronically Signed   By: Elmer Picker M.D.   On: 09/12/2021 10:10   DG Chest 1 View  Result Date: 09/11/2021 CLINICAL DATA:  [id number] EXAM: CHEST  1 VIEW COMPARISON:  None Available. FINDINGS: Lungs are well expanded, symmetric, and clear. No pneumothorax or pleural effusion. Cardiac size within normal limits. Pulmonary vascularity is normal. Osseous structures are age-appropriate. No acute bone abnormality. IMPRESSION: No active disease. Electronically Signed   By: Fidela Salisbury M.D.   On: 09/11/2021 23:19     Medications:    sodium bicarbonate 150 mEq in D5W infusion 125 mL/hr at 09/13/21 1036    brimonidine  1 drop Both Eyes Daily   doxepin  50 mg Oral QHS   DULoxetine  40 mg Oral q AM   enoxaparin (LOVENOX) injection  30 mg Subcutaneous Q24H   famotidine  20 mg Oral QHS   leflunomide  20 mg Oral Daily   loratadine  10 mg Oral Daily   melatonin  5 mg Oral QHS   [START ON 09/19/2021] mometasone-formoterol  2 puff Inhalation BID   neomycin-polymyxin b-dexamethasone  1 drop Both Eyes Q6H   oxybutynin  10 mg Oral Q1500   pantoprazole  40 mg Oral Daily   pentosan polysulfate  100 mg Oral QHS   potassium chloride SA  40 mEq Oral BID   prednisoLONE acetate  1 drop Both Eyes QID   psyllium  1 packet Oral Daily    sodium citrate-citric acid  15 mL Oral BID   tamsulosin  0.4 mg Oral QHS   theophylline  400 mg Oral BID   thiamine  50 mg Oral Daily   tiotropium  1 capsule Inhalation Daily   acetaminophen **OR** acetaminophen, albuterol, fluocinonide cream, fluticasone, magnesium hydroxide, traMADol, traZODone, Urelle  Assessment/ Plan:  Ms. Jenay Morici Armfield Barnett is a 51 y.o.  female with past medical conditions including Raynauds disease, asthma, anemia, GERD, peripheral neuropathy, and chronic kidney diease stage 3b. Patient presents to emergency department with complaints of weakness, altered mental status and lethargy. She is being admitted for Metabolic acidosis [O24.23] AKI (acute kidney injury) ([place]) [N17.9]   Acute Kidney Injury with hypokalmia/hyponatremia on chronic kidney disease stage IIIb with baseline creatinine 1.89 and GFR of 32 on 08/08/21.  Acute kidney injury secondary to hypovolemia from nausea, vomiting and diarrhea.  Chronic kidney disease is secondary to multiple acute kidney  injuries CT abd pelvis shows no obstruction. No exposure to IV contrast.   Renal function slightly improved. Potassium corrected with IV supplementation. Will continue oral potassium packets 26meq BID today. Sodium improving with IVF. Continue IVF and monitoring of labs. Encouraged to maintain oral intake.   Lab Results  Component Value Date   CREATININE 2.37 (H) 09/13/2021   CREATININE 2.40 (H) 09/12/2021   CREATININE 2.72 (H) 09/12/2021    Intake/Output Summary (Last 24 hours) at 09/13/2021 1129 Last data filed at 09/12/2021 2000 Gross per 24 hour  Intake 0 ml  Output 600 ml  Net -600 ml    2. Acute on chronic metabolic acidosis. Improved to Bicarb 13 today. Increased  IVF to Sodium bicarbonate 158ml/hr.   3. Anemia of chronic kidney disease Lab Results  Component Value Date   HGB 10.2 (L) 09/12/2021    Hgb within desired target    LOS: 2 Kyandre Okray 5/24/202311:29 AM

## 2021-09-14 DIAGNOSIS — F509 Eating disorder, unspecified: Secondary | ICD-10-CM

## 2021-09-14 DIAGNOSIS — E43 Unspecified severe protein-calorie malnutrition: Secondary | ICD-10-CM | POA: Insufficient documentation

## 2021-09-14 DIAGNOSIS — N179 Acute kidney failure, unspecified: Secondary | ICD-10-CM | POA: Diagnosis not present

## 2021-09-14 LAB — BASIC METABOLIC PANEL
Anion gap: 9 (ref 5–15)
BUN: 55 mg/dL — ABNORMAL HIGH (ref 6–20)
CO2: 21 mmol/L — ABNORMAL LOW (ref 22–32)
Calcium: 7.6 mg/dL — ABNORMAL LOW (ref 8.9–10.3)
Chloride: 104 mmol/L (ref 98–111)
Creatinine, Ser: 2.04 mg/dL — ABNORMAL HIGH (ref 0.44–1.00)
GFR, Estimated: 29 mL/min — ABNORMAL LOW (ref >60)
Glucose, Bld: 99 mg/dL (ref 70–99)
Potassium: 3.3 mmol/L — ABNORMAL LOW (ref 3.5–5.1)
Sodium: 134 mmol/L — ABNORMAL LOW (ref 135–145)

## 2021-09-14 LAB — MAGNESIUM: Magnesium: 1.6 mg/dL — ABNORMAL LOW (ref 1.7–2.4)

## 2021-09-14 MED ORDER — POTASSIUM CHLORIDE 20 MEQ PO PACK
40.0000 meq | PACK | Freq: Two times a day (BID) | ORAL | Status: DC
  Administered 2021-09-14 – 2021-09-15 (×2): 40 meq via ORAL
  Filled 2021-09-14 (×2): qty 2

## 2021-09-14 MED ORDER — MAGNESIUM SULFATE 2 GM/50ML IV SOLN
2.0000 g | Freq: Once | INTRAVENOUS | Status: AC
  Administered 2021-09-14: 2 g via INTRAVENOUS
  Filled 2021-09-14: qty 50

## 2021-09-14 MED ORDER — ADULT MULTIVITAMIN W/MINERALS CH
1.0000 | ORAL_TABLET | Freq: Every day | ORAL | Status: DC
  Administered 2021-09-14 – 2021-09-15 (×2): 1 via ORAL
  Filled 2021-09-14 (×2): qty 1

## 2021-09-14 MED ORDER — ENOXAPARIN SODIUM 40 MG/0.4ML IJ SOSY
40.0000 mg | PREFILLED_SYRINGE | INTRAMUSCULAR | Status: DC
  Administered 2021-09-15: 40 mg via SUBCUTANEOUS
  Filled 2021-09-14: qty 0.4

## 2021-09-14 MED ORDER — ENSURE MAX PROTEIN PO LIQD
11.0000 [oz_av] | Freq: Two times a day (BID) | ORAL | Status: DC
  Administered 2021-09-14 – 2021-09-15 (×2): 11 [oz_av] via ORAL
  Filled 2021-09-14: qty 330

## 2021-09-14 NOTE — Progress Notes (Signed)
Initial Nutrition Assessment  DOCUMENTATION CODES:   Severe malnutrition in context of chronic illness  INTERVENTION:  - Encourage PO intake  - Ensure Max po BID, each supplement provides 150 kcal and 30 grams of protein.   - MVI with minerals daily  - Monitor magnesium, potassium, and phosphorus BID for at least 3 days, MD to replete as needed, as pt is at risk for refeeding syndrome  - Request updated measured weight  - Referral placed to outpatient dietitian  NUTRITION DIAGNOSIS:   Severe Malnutrition related to chronic illness (CKD, GERD, Raynaud's disease) as evidenced by energy intake < or equal to 75% for > or equal to 1 month, mild fat depletion, moderate fat depletion, severe muscle depletion, moderate muscle depletion.  GOAL:   Patient will meet greater than or equal to 90% of their needs  MONITOR:   PO intake, Supplement acceptance, Labs, Weight trends  REASON FOR ASSESSMENT:   Consult Assessment of nutrition requirement/status, Diet education, Poor PO  ASSESSMENT:   Pt admitted with acute onset of AMS with confusion and lethargy found to have AKI. PMH significant for Raynaud's disease, GERD, peripheral neuropathy, asthma and anemia.  Pt sitting in chair with husband present. He endorses a degree of long term disordered eating. At home she typically eats 2 meals per day which has been ongoing for quite some time. She usually has a bagel with avocado around 4P and then dinner around 8P. Pt follows a pescatarian diet. During admission, she endorses eating >50% of each meal but reports that she has limited options as she only eats fish. She has been eating a variety of vegetables, rice, toast and fish. Observed breakfast tray on table including omelette with bites taken. At home she may drink 1-2 Ensure Max weekly and is agreeable to receiving them during admission. Pt reports taking capsules of fruits and vegetables but was advised not to take MVI d/t the calcium  content. Discussed with her the benefits of taking a MVI during admission and she is agreeable. Briefly spoke with pt about importance of adequate nutrition for general health and wellbeing and muscle maintenance.   Pt states that she take dulcolax daily to aid in bowel movements and usually has 2 a day. Pt states that she is currently experiencing constipation, noted MD ordering stool softener.   Pt reports stable weight around 125 lbs. Per review of wt hx, it appears pt has had a 21% wt loss from 03/10/21-05/14/21, however noted her weight is +16.5 kg in the last 4 months. Will continue to monitor throughout admission.   Medications: pepcid, melatonin, protonix, KLOR-CON, psyllium, oracit, thiamine 50mg  daily  IV drips: sodium bicarbonate in D5 @ 150ml/hr  Labs: sodium 134, potassium 3.3 (replacing), BUN 55, Cr 2.04, Ca 7.6, GFR 29  NUTRITION - FOCUSED PHYSICAL EXAM:  Flowsheet Row Most Recent Value  Orbital Region Mild depletion  Upper Arm Region Severe depletion  Thoracic and Lumbar Region Moderate depletion  Buccal Region No depletion  Temple Region Moderate depletion  Clavicle Bone Region Moderate depletion  Clavicle and Acromion Bone Region Severe depletion  Scapular Bone Region Severe depletion  Dorsal Hand Mild depletion  Patellar Region Severe depletion  Anterior Thigh Region Severe depletion  Posterior Calf Region Moderate depletion  Edema (RD Assessment) None  Hair Reviewed  Eyes Reviewed  Mouth Reviewed  Skin Reviewed  Nails Reviewed       Diet Order:   Diet Order  Diet regular Room service appropriate? Yes; Fluid consistency: Thin  Diet effective now                   EDUCATION NEEDS:   Education needs have been addressed  Skin:  Skin Assessment: Reviewed RN Assessment  Last BM:  5/24  Height:   Ht Readings from Last 1 Encounters:  09/11/21 5\' 9"  (1.753 m)    Weight:   Wt Readings from Last 1 Encounters:  09/11/21 61.2 kg    BMI:  Body mass index is 19.94 kg/m.  Estimated Nutritional Needs:   Kcal:  1800-2000  Protein:  90-105g  Fluid:  >/=1.8L  Clayborne Dana, RDN, LDN Clinical Nutrition

## 2021-09-14 NOTE — Progress Notes (Addendum)
[facility]  ROUNDING NOTE   Subjective:   Katherine Perez is a 51 y.o. female with past medical conditions including Raynauds disease, asthma, anemia, GERD, peripheral neuropathy, and chronic kidney diease stage 3b. Patient presents to emergency department with complaints of weakness, altered mental status and lethargy. She is being admitted for Metabolic acidosis [Z61.09] AKI (acute kidney injury) [facility]) [N17.9]  Patient is known to our practice and is followed by Dr Holley Raring outpatient. Her last office visit was on 08/02/21.   Update: Patient sitting up in bed, no family at bedside Alert and oriented States appetite has improved, denies nausea and vomiting Complains of constipation.States she take 8-9 Dulcolax tabs nightly  Objective:  Vital signs in last 24 hours:  Temp:  [98.8 F (37.1 C)-99.5 F (37.5 C)] 99.5 F (37.5 C) (05/25 0739) Pulse Rate:  [85-92] 88 (05/25 0739) Resp:  [16-18] 18 (05/25 0739) BP: (111-128)/(67-73) 118/72 (05/25 0739) SpO2:  [94 %-96 %] 94 % (05/25 0739)  Weight change:  Filed Weights   09/11/21 1351  Weight: 61.2 kg    Intake/Output: I/O last 3 completed shifts: In: 4098.1 [P.O.:240; I.V.:3858.1] Out: 2202 [Urine:2200; Stool:2]   Intake/Output this shift:  Total I/O In: 868.1 [I.V.:868.1] Out: -   Physical Exam: General: NAD   Head: Normocephalic, atraumatic.  Moist oral mucosal membranes  Eyes: Anicteric  Lungs:  Clear to auscultation, normal effort, room air  Heart: Regular rate and rhythm  Abdomen:  Soft, nontender  Extremities: No peripheral edema.  Neurologic: Nonfocal, moving all four extremities  Skin: No lesions  Access: None    Basic Metabolic Panel: Recent Labs  Lab 09/11/21 1353 09/11/21 1553 09/12/21 0307 09/12/21 2308 09/13/21 0342 09/14/21 0426  NA 129*  --  132* 130* 132* 134*  K 3.4*  --  2.9* 3.1* 4.1 3.3*  CL 105  --  114* 110 111 104  CO2 9*  --  8* 11* 13* 21*  GLUCOSE 83   --  86 138* 90 99  BUN 82*  --  78* 67* 65* 55*  CREATININE 3.05*  --  2.72* 2.40* 2.37* 2.04*  CALCIUM 8.5*  --  8.0* 7.9* 7.7* 7.6*  MG  --  2.3  --  1.8  --  1.6*  PHOS  --  4.0  --   --   --   --      Liver Function Tests: Recent Labs  Lab 09/12/21 2308  AST 17  ALT 14  ALKPHOS 94  BILITOT 0.4  PROT 5.3*  ALBUMIN 2.6*    No results for input(s): LIPASE, AMYLASE in the last 168 hours. No results for input(s): AMMONIA in the last 168 hours.  CBC: Recent Labs  Lab 09/11/21 1353 09/12/21 0307  WBC 13.4* 12.0*  HGB 11.7* 10.2*  HCT 35.8* 31.0*  MCV 90.4 90.9  PLT 388 326     Cardiac Enzymes: No results for input(s): CKTOTAL, CKMB, CKMBINDEX, TROPONINI in the last 168 hours.  BNP: Invalid input(s): POCBNP  CBG: No results for input(s): GLUCAP in the last 168 hours.  Microbiology: Results for orders placed or performed during the hospital encounter of 09/11/21  Resp Panel by RT-PCR (Flu A&B, Covid) Nasopharyngeal Swab     Status: None   Collection Time: 09/11/21  9:55 PM   Specimen: Nasopharyngeal Swab; Nasopharyngeal(NP) swabs in vial transport medium  Result Value Ref Range Status   SARS Coronavirus 2 by RT PCR NEGATIVE NEGATIVE Final    Comment: (  NOTE) SARS-CoV-2 target nucleic acids are NOT DETECTED.  The SARS-CoV-2 RNA is generally detectable in upper respiratory specimens during the acute phase of infection. The lowest concentration of SARS-CoV-2 viral copies this assay can detect is 138 copies/mL. A negative result does not preclude SARS-Cov-2 infection and should not be used as the sole basis for treatment or other patient management decisions. A negative result may occur with  improper specimen collection/handling, submission of specimen other than nasopharyngeal swab, presence of viral mutation(s) within the areas targeted by this assay, and inadequate number of viral copies(<138 copies/mL). A negative result must be combined with clinical  observations, patient history, and epidemiological information. The expected result is Negative.  Fact Sheet for Patients:  EntrepreneurPulse.com.au  Fact Sheet for Healthcare Providers:  IncredibleEmployment.be  This test is no t yet approved or cleared by the [place] FDA and  has been authorized for detection and/or diagnosis of SARS-CoV-2 by FDA under an Emergency Use Authorization (EUA). This EUA will remain  in effect (meaning this test can be used) for the duration of the COVID-19 declaration under Section 564(b)(1) of the Act, 21 U.S.C.section 360bbb-3(b)(1), unless the authorization is terminated  or revoked sooner.       Influenza A by PCR NEGATIVE NEGATIVE Final   Influenza B by PCR NEGATIVE NEGATIVE Final    Comment: (NOTE) The Xpert Xpress SARS-CoV-2/FLU/RSV plus assay is intended as an aid in the diagnosis of influenza from Nasopharyngeal swab specimens and should not be used as a sole basis for treatment. Nasal washings and aspirates are unacceptable for Xpert Xpress SARS-CoV-2/FLU/RSV testing.  Fact Sheet for Patients: EntrepreneurPulse.com.au  Fact Sheet for Healthcare Providers: IncredibleEmployment.be  This test is not yet approved or cleared by the [place] FDA and has been authorized for detection and/or diagnosis of SARS-CoV-2 by FDA under an Emergency Use Authorization (EUA). This EUA will remain in effect (meaning this test can be used) for the duration of the COVID-19 declaration under Section 564(b)(1) of the Act, 21 U.S.C. section 360bbb-3(b)(1), unless the authorization is terminated or revoked.  Performed at [facility], [address] [place], [place] [address]     Coagulation Studies: No results for input(s): LABPROT, INR in the last 72 hours.  Urinalysis: Recent Labs    09/12/21 0243  COLORURINE YELLOW*  LABSPEC 1.008  PHURINE 6.0   GLUCOSEU NEGATIVE  HGBUR MODERATE*  BILIRUBINUR NEGATIVE  KETONESUR NEGATIVE  PROTEINUR 100*  NITRITE NEGATIVE  LEUKOCYTESUR LARGE*       Imaging: No results found.   Medications:    sodium bicarbonate 150 mEq in D5W infusion 125 mL/hr at 09/14/21 0631    brimonidine  1 drop Both Eyes Daily   doxepin  50 mg Oral QHS   DULoxetine  40 mg Oral q AM   enoxaparin (LOVENOX) injection  30 mg Subcutaneous Q24H   famotidine  20 mg Oral QHS   leflunomide  20 mg Oral Daily   loratadine  10 mg Oral Daily   melatonin  5 mg Oral QHS   [START ON 09/19/2021] mometasone-formoterol  2 puff Inhalation BID   multivitamin with minerals  1 tablet Oral Daily   neomycin-polymyxin b-dexamethasone  1 drop Both Eyes Q6H   oxybutynin  10 mg Oral Q1500   pantoprazole  40 mg Oral Daily   pentosan polysulfate  100 mg Oral QHS   potassium chloride  40 mEq Oral BID   prednisoLONE acetate  1 drop Both Eyes QID   Ensure  Max Protein  11 oz Oral BID   psyllium  1 packet Oral Daily   sodium citrate-citric acid  15 mL Oral BID   tamsulosin  0.4 mg Oral QHS   theophylline  400 mg Oral BID   thiamine  50 mg Oral Daily   tiotropium  1 capsule Inhalation Daily   acetaminophen **OR** acetaminophen, albuterol, fluocinonide cream, fluticasone, magnesium hydroxide, traMADol, traZODone, Urelle  Assessment/ Plan:  Ms. Nikole Swartzentruber Armfield Devora is a 51 y.o.  female with past medical conditions including Raynauds disease, asthma, anemia, GERD, peripheral neuropathy, and chronic kidney diease stage 3b. Patient presents to emergency department with complaints of weakness, altered mental status and lethargy. She is being admitted for Metabolic acidosis [X90.24] AKI (acute kidney injury) ([place]) [N17.9]   Acute Kidney Injury with hypokalmia/hyponatremia on chronic kidney disease stage IIIb with baseline creatinine 1.89 and GFR of 32 on 08/08/21.  Acute kidney injury secondary to hypovolemia from nausea, vomiting and  diarrhea.  Chronic kidney disease is secondary to multiple acute kidney injuries CT abd pelvis shows no obstruction. No exposure to IV contrast.   Creatinine improving with 1.6L of urine recorded in previous 24 hours. Continue IVF and will consider d/c fluids tomorrow. Sodium stable. Potassium slightly decreased. Restarted home regimen of potassium twice daily yesterday.   Lab Results  Component Value Date   CREATININE 2.04 (H) 09/14/2021   CREATININE 2.37 (H) 09/13/2021   CREATININE 2.40 (H) 09/12/2021    Intake/Output Summary (Last 24 hours) at 09/14/2021 1332 Last data filed at 09/14/2021 1200 Gross per 24 hour  Intake 3236.25 ml  Output 1602 ml  Net 1634.25 ml     2. Acute on chronic metabolic acidosis. Correcting with Sodium bicarbonate 178ml/hr.   3. Anemia of chronic kidney disease Lab Results  Component Value Date   HGB 10.2 (L) 09/12/2021    Hgb at goal    LOS: 3 Emillee Talsma 5/25/20231:32 PM

## 2021-09-14 NOTE — Progress Notes (Signed)
PROGRESS NOTE    Katherine Perez  HQI:[id number] DOB: 06-15-1970 DOA: 09/11/2021 PCP: Gladstone Lighter, MD    Brief Narrative:  51 y.o. female with medical history significant for GERD, peripheral neuropathy, Raynaud's disease, asthma and anemia, who presented to the ER with acute onset of altered mental status with confusion and lethargy.  She admitted to having diarrhea for the last couple days with loose bowel movements.  She admitted to nausea and vomiting once with no melena protruding per rectum.  She denied any bilious vomitus or hematemesis or abdominal pain.  She has mild cough with expectoration of clear mucus.  No dyspnea or wheezing.  No fever or chills.  No chest pain or palpitations.  She admits to urinary urgency with no hematuria or dysuria.    Assessment & Plan:   Principal Problem:   AKI (acute kidney injury) ([place]) Active Problems:   Acute metabolic encephalopathy   Hypokalemia   Acute metabolic acidosis   Hyponatremia   Asthma, chronic   Peripheral neuropathy   GERD without esophagitis   Rheumatoid arthritis involving multiple sites with positive rheumatoid factor (HCC)   Chronic constipation   Gastroparesis   Anorexia  Anorexia This is her primary problem and essentially all other problems are downstream consequence of this problem and things she has done ( such as chronic use of stimulant laxitives) in service of this problem. Discussed this with her husband and he is in agreement. Patient reports treatment for this decades ago. Thinks it is much better controlled now but recurrent hospitalizations belie this - psych consulted, yet to see. - RD consulted  AKI (acute kidney injury) ([place]) on CKD 3b Suspect prerenal azotemia in the setting of decreased PO. Has had similar such presentations in the past. Baseline gfr low 30s with cr in the upper 1s. Is approaching baseline here Plan: Nephrology consulted Supplemental IV fluids - IV bicarb Daily  renal function   Hypokalemia Hyponatremia Suspect secondary to chronic reduced po replace potassium   Acute metabolic encephalopathy Suspect secondary to uremia also potentially volume depletion. Resolved.   Acute metabolic acidosis Severe acidosis with bicarbonate of 8 on BMP Nephrology following Started on IV bicarbonate infusion Much improved  Asthma, chronic PTA bronchodilators and theophylline   Peripheral neuropathy PTA Neurontin   GERD without esophagitis PTA H2 blocker   DVT prophylaxis: Lovenox Code Status: DNR Family Communication: husband updated telephonically 5/24 Disposition Plan: Status is: Inpatient Remains inpatient appropriate because: Severe acute renal failure.  Severe metabolic acidosis   Level of care: Med-Surg  Consultants:  Nephrology  Procedures:  None  Antimicrobials: None   Subjective: Calm, no pain  Objective: Vitals:   09/13/21 1813 09/13/21 1941 09/14/21 0407 09/14/21 0739  BP: 128/73 111/67 113/69 118/72  Pulse: 92 85 88 88  Resp: 16 18 18 18   Temp: 98.8 F (37.1 C) 98.9 F (37.2 C) 99 F (37.2 C) 99.5 F (37.5 C)  TempSrc:      SpO2: 96% 95% 94% 94%  Weight:      Height:        Intake/Output Summary (Last 24 hours) at 09/14/2021 1257 Last data filed at 09/14/2021 1200 Gross per 24 hour  Intake 3236.25 ml  Output 1602 ml  Net 1634.25 ml   Filed Weights   09/11/21 1351  Weight: 61.2 kg    Examination:  General exam: NAD.   Respiratory system: Lungs clear.  Normal work of breathing.  Room air Cardiovascular system: S1-S2, RRR, no murmurs,  no pedal edema Gastrointestinal system: Thin/scaphoid, soft, NT/ND, hypoactive bowel sounds Central nervous system: Alert.  Oriented x2.  No focal deficits Extremities: Symmetric 5 x 5 power. Skin: No rashes, lesions or ulcers Psychiatry: Judgement and insight appear impaired. Mood & affect flattened.     Data Reviewed:   CBC: Recent Labs  Lab 09/11/21 1353  09/12/21 0307  WBC 13.4* 12.0*  HGB 11.7* 10.2*  HCT 35.8* 31.0*  MCV 90.4 90.9  PLT 388 353   Basic Metabolic Panel: Recent Labs  Lab 09/11/21 1353 09/11/21 1553 09/12/21 0307 09/12/21 2308 09/13/21 0342 09/14/21 0426  NA 129*  --  132* 130* 132* 134*  K 3.4*  --  2.9* 3.1* 4.1 3.3*  CL 105  --  114* 110 111 104  CO2 9*  --  8* 11* 13* 21*  GLUCOSE 83  --  86 138* 90 99  BUN 82*  --  78* 67* 65* 55*  CREATININE 3.05*  --  2.72* 2.40* 2.37* 2.04*  CALCIUM 8.5*  --  8.0* 7.9* 7.7* 7.6*  MG  --  2.3  --  1.8  --   --   PHOS  --  4.0  --   --   --   --    GFR: Estimated Creatinine Clearance: 32.2 mL/min (A) (by C-G formula based on SCr of 2.04 mg/dL (H)). Liver Function Tests: Recent Labs  Lab 09/12/21 2308  AST 17  ALT 14  ALKPHOS 94  BILITOT 0.4  PROT 5.3*  ALBUMIN 2.6*   No results for input(s): LIPASE, AMYLASE in the last 168 hours. No results for input(s): AMMONIA in the last 168 hours. Coagulation Profile: No results for input(s): INR, PROTIME in the last 168 hours. Cardiac Enzymes: No results for input(s): CKTOTAL, CKMB, CKMBINDEX, TROPONINI in the last 168 hours. BNP (last 3 results) No results for input(s): PROBNP in the last 8760 hours. HbA1C: No results for input(s): HGBA1C in the last 72 hours. CBG: No results for input(s): GLUCAP in the last 168 hours. Lipid Profile: No results for input(s): CHOL, HDL, LDLCALC, TRIG, CHOLHDL, LDLDIRECT in the last 72 hours. Thyroid Function Tests: No results for input(s): TSH, T4TOTAL, FREET4, T3FREE, THYROIDAB in the last 72 hours. Anemia Panel: No results for input(s): VITAMINB12, FOLATE, FERRITIN, TIBC, IRON, RETICCTPCT in the last 72 hours. Sepsis Labs: No results for input(s): PROCALCITON, LATICACIDVEN in the last 168 hours.  Recent Results (from the past 240 hour(s))  Resp Panel by RT-PCR (Flu A&B, Covid) Nasopharyngeal Swab     Status: None   Collection Time: 09/11/21  9:55 PM   Specimen:  Nasopharyngeal Swab; Nasopharyngeal(NP) swabs in vial transport medium  Result Value Ref Range Status   SARS Coronavirus 2 by RT PCR NEGATIVE NEGATIVE Final    Comment: (NOTE) SARS-CoV-2 target nucleic acids are NOT DETECTED.  The SARS-CoV-2 RNA is generally detectable in upper respiratory specimens during the acute phase of infection. The lowest concentration of SARS-CoV-2 viral copies this assay can detect is 138 copies/mL. A negative result does not preclude SARS-Cov-2 infection and should not be used as the sole basis for treatment or other patient management decisions. A negative result may occur with  improper specimen collection/handling, submission of specimen other than nasopharyngeal swab, presence of viral mutation(s) within the areas targeted by this assay, and inadequate number of viral copies(<138 copies/mL). A negative result must be combined with clinical observations, patient history, and epidemiological information. The expected result is Negative.  Fact Sheet for  Patients:  EntrepreneurPulse.com.au  Fact Sheet for Healthcare Providers:  IncredibleEmployment.be  This test is no t yet approved or cleared by the [place] FDA and  has been authorized for detection and/or diagnosis of SARS-CoV-2 by FDA under an Emergency Use Authorization (EUA). This EUA will remain  in effect (meaning this test can be used) for the duration of the COVID-19 declaration under Section 564(b)(1) of the Act, 21 U.S.C.section 360bbb-3(b)(1), unless the authorization is terminated  or revoked sooner.       Influenza A by PCR NEGATIVE NEGATIVE Final   Influenza B by PCR NEGATIVE NEGATIVE Final    Comment: (NOTE) The Xpert Xpress SARS-CoV-2/FLU/RSV plus assay is intended as an aid in the diagnosis of influenza from Nasopharyngeal swab specimens and should not be used as a sole basis for treatment. Nasal washings and aspirates are unacceptable for  Xpert Xpress SARS-CoV-2/FLU/RSV testing.  Fact Sheet for Patients: EntrepreneurPulse.com.au  Fact Sheet for Healthcare Providers: IncredibleEmployment.be  This test is not yet approved or cleared by the [place] FDA and has been authorized for detection and/or diagnosis of SARS-CoV-2 by FDA under an Emergency Use Authorization (EUA). This EUA will remain in effect (meaning this test can be used) for the duration of the COVID-19 declaration under Section 564(b)(1) of the Act, 21 U.S.C. section 360bbb-3(b)(1), unless the authorization is terminated or revoked.  Performed at [facility], [address], [place], [place] [address]          Radiology Studies: No results found.      Scheduled Meds:  brimonidine  1 drop Both Eyes Daily   doxepin  50 mg Oral QHS   DULoxetine  40 mg Oral q AM   enoxaparin (LOVENOX) injection  30 mg Subcutaneous Q24H   famotidine  20 mg Oral QHS   leflunomide  20 mg Oral Daily   loratadine  10 mg Oral Daily   melatonin  5 mg Oral QHS   [START ON 09/19/2021] mometasone-formoterol  2 puff Inhalation BID   multivitamin with minerals  1 tablet Oral Daily   neomycin-polymyxin b-dexamethasone  1 drop Both Eyes Q6H   oxybutynin  10 mg Oral Q1500   pantoprazole  40 mg Oral Daily   pentosan polysulfate  100 mg Oral QHS   potassium chloride  40 mEq Oral BID   prednisoLONE acetate  1 drop Both Eyes QID   Ensure Max Protein  11 oz Oral BID   psyllium  1 packet Oral Daily   sodium citrate-citric acid  15 mL Oral BID   tamsulosin  0.4 mg Oral QHS   theophylline  400 mg Oral BID   thiamine  50 mg Oral Daily   tiotropium  1 capsule Inhalation Daily   Continuous Infusions:  sodium bicarbonate 150 mEq in D5W infusion 125 mL/hr at 09/14/21 0631     LOS: 3 days      Desma Maxim, MD [facility]   If 7PM-7AM, please contact night-coverage  09/14/2021, 12:57 PM

## 2021-09-14 NOTE — Evaluation (Signed)
Physical Therapy Evaluation Patient Details Name: Katherine Perez MRN: [id number] DOB: 11-26-1970 Today's Date: 09/14/2021  History of Present Illness  Pt admitted for AKI with complaints of AMS and confusion. HIstory includes GERD, neuropathy, raynauds disease, asthma, and anorexia.  Clinical Impression  Pt is a pleasant 51 year old female who was admitted for AKI. Pt performs bed mobility with mod I, transfers with cga, and ambulation with cga and RW. Pt demonstrates deficits with strength/balance/mobility. Pt reports history of falls, however reports since using RW hasn't fallen. Would benefit from skilled PT to address above deficits and promote optimal return to PLOF. Upon arrival, pt with faulty purewick and had soiled pants. Pt demonstrates safe mobility (with assistance) to ambulate to/from bathroom. Educated on discontinuing purewick. Pt wears depends at baseline. Currently recommending OP PT, pt active with therapy in [place], plans to return once discharged.     Recommendations for follow up therapy are one component of a multi-disciplinary discharge planning process, led by the attending physician.  Recommendations may be updated based on patient status, additional functional criteria and insurance authorization.  Follow Up Recommendations Outpatient PT    Assistance Recommended at Discharge Intermittent Supervision/Assistance  Patient can return home with the following  A little help with walking and/or transfers;A little help with bathing/dressing/bathroom;Assistance with cooking/housework;Assist for transportation;Help with stairs or ramp for entrance    Equipment Recommendations BSC/3in1  Recommendations for Other Services       Functional Status Assessment Patient has had a recent decline in their functional status and demonstrates the ability to make significant improvements in function in a reasonable and predictable amount of time.     Precautions /  Restrictions Precautions Precautions: Fall Restrictions Weight Bearing Restrictions: No      Mobility  Bed Mobility Overal bed mobility: Modified Independent             General bed mobility comments: able to use railing and sit at EOB. Slow technique    Transfers Overall transfer level: Needs assistance Equipment used: Rolling walker (2 wheels) Transfers: Sit to/from Stand Sit to Stand: Min guard           General transfer comment: slow and effortful, however once standing, upright posture and no LOB. RW used    Ambulation/Gait Ambulation/Gait assistance: Min Conservator, museum/gallery (Feet): 50 Feet Assistive device: Rolling walker (2 wheels) Gait Pattern/deviations: Step-through pattern       General Gait Details: slow gait speed with increased genu recuvatum. Incrased B UE on RW  Stairs            Wheelchair Mobility    Modified Rankin (Stroke Patients Only)       Balance Overall balance assessment: Needs assistance, History of Falls Sitting-balance support: Feet supported, No upper extremity supported Sitting balance-Leahy Scale: Good     Standing balance support: Bilateral upper extremity supported Standing balance-Leahy Scale: Good                               Pertinent Vitals/Pain Pain Assessment Pain Assessment: No/denies pain    Home Living Family/patient expects to be discharged to:: Private residence Living Arrangements: Spouse/significant other Available Help at Discharge: Family;Available 24 hours/day Type of Home: House Home Access: Stairs to enter Entrance Stairs-Rails: None Entrance Stairs-Number of Steps: 2   Home Layout: One level Home Equipment: Conservation officer, nature (2 wheels);Shower seat      Prior Function Prior Level of Function :  Needs assist;Driving             Mobility Comments: patient uses walker at baseline. States she drives. History of falls. Was getting OP PT       Hand Dominance         Extremity/Trunk Assessment   Upper Extremity Assessment Upper Extremity Assessment: Generalized weakness (B UE grossly 4/5)    Lower Extremity Assessment Lower Extremity Assessment: Generalized weakness (B LE grossly 3+/5)       Communication   Communication: No difficulties  Cognition Arousal/Alertness: Awake/alert Behavior During Therapy: WFL for tasks assessed/performed Overall Cognitive Status: Within Functional Limits for tasks assessed                                          General Comments      Exercises Other Exercises Other Exercises: Pt's clothing was soiled due to faulty pure wick. Needed mod assist for donning/doffing clothes/depends. Assist with changing pants. Encouraged not to use purewick and to use bathroom   Assessment/Plan    PT Assessment Patient needs continued PT services  PT Problem List Decreased strength;Decreased activity tolerance;Decreased balance;Decreased mobility;Decreased safety awareness       PT Treatment Interventions DME instruction;Gait training;Therapeutic exercise;Balance training    PT Goals (Current goals can be found in the Care Plan section)  Acute Rehab PT Goals Patient Stated Goal: to get stronger PT Goal Formulation: With patient Time For Goal Achievement: 09/28/21 Potential to Achieve Goals: Good    Frequency Min 2X/week     Co-evaluation               AM-PAC PT "6 Clicks" Mobility  Outcome Measure Help needed turning from your back to your side while in a flat bed without using bedrails?: None Help needed moving from lying on your back to sitting on the side of a flat bed without using bedrails?: None Help needed moving to and from a bed to a chair (including a wheelchair)?: A Little Help needed standing up from a chair using your arms (e.g., wheelchair or bedside chair)?: A Little Help needed to walk in hospital room?: A Little Help needed climbing 3-5 steps with a railing? : A Lot 6  Click Score: 19    End of Session Equipment Utilized During Treatment: Gait belt Activity Tolerance: Patient tolerated treatment well Patient left: in chair Nurse Communication: Mobility status PT Visit Diagnosis: Muscle weakness (generalized) (M62.81);Difficulty in walking, not elsewhere classified (R26.2)    Time: 5003-7048 PT Time Calculation (min) (ACUTE ONLY): 24 min   Charges:   PT Evaluation $PT Eval Low Complexity: 1 Low PT Treatments $Therapeutic Activity: 8-22 mins        Greggory Stallion, PT, DPT, GCS [phone]   Katherine Perez 09/14/2021, 1:33 PM

## 2021-09-14 NOTE — Consult Note (Signed)
[facility] Consult   Reason for Consult: Patient seen for concerns about disordered eating Referring Physician:  wouk Patient Identification: Katherine Perez MRN:  [id number] Principal Diagnosis: Eating disorder Diagnosis:  Principal Problem:   Eating disorder Active Problems:   Hyponatremia   Acute metabolic encephalopathy   GERD without esophagitis   Hypokalemia   AKI (acute kidney injury) ([facility])   Rheumatoid arthritis involving multiple sites with positive rheumatoid factor (HCC)   Chronic constipation   Acute metabolic acidosis   Asthma, chronic   Peripheral neuropathy   Gastroparesis   Anorexia   Total Time spent with patient: 45 minutes  Subjective:   Katherine Perez is a 51 y.o. female patient admitted with "I just was not feeling well".  HPI: Patient seen and chart reviewed.  This is a 51 year old woman who was admitted to the hospital with GI symptoms and electrolyte abnormalities.  Patient states that she had not eaten anything for a few days before coming into the hospital.  She says this was because she had been "not feeling well" and that when she does not feel well she finds it easier to not eat.  When I ask her to be more specific about not feeling well she said it was just "tummy problems".  According to the chart she was complaining of nausea and vomiting.  Patient acknowledges that she has had multiple episodes of this.  She says her normal eating pattern at home is to eat a small meal in the afternoon and a small supper.  She says that she does not normally feel like she is intentionally restricting.  Denies being obsessed with her weight.  Denies trying to lose weight.  She admits however that when she gets one of her frequent spells of GI complaints she will simply stop eating and that this can go for days at a time.  She admits that her husband has been very concerned about this.  She seems rather blas about it and does  not seem overly concerned.  Patient has had multiple hospitalizations with similar complaints that have progressed to acute kidney injury requiring inpatient medical treatment.  Patient denies other mental health symptoms.  Denies being depressed denies any sleep problems denies psychotic symptoms denies suicidal ideation.  Her body habitus and facial habitus are pretty typical of a person with chronic eating problems.  She looks very underweight especially around the face.  Denies alcohol or drug problems.  She is not currently getting any kind of outpatient mental health care  Past Psychiatric History: Patient tells me she does not want to go into much detail about it but that she has had psychiatric treatment in the past including a prior hospitalization decades ago and treatment for both bulimia and anorexia in the past.  Does not recall ever being on any medication in the past.  Denies any past suicide attempts.  Risk to Self:   Risk to Others:   Prior Inpatient Therapy:   Prior Outpatient Therapy:    Past Medical History:  Past Medical History:  Diagnosis Date   Anemia    Asthma    CKD (chronic kidney disease)    ETOH abuse    GERD (gastroesophageal reflux disease)    IC (interstitial cystitis)    IDA (iron deficiency anemia) 10/07/2020   Laxative abuse    Neuropathy    Pneumonia    Raynaud disease     Past Surgical History:  Procedure Laterality Date  COLONOSCOPY     ERCP N/A 07/01/2020   Procedure: ENDOSCOPIC RETROGRADE CHOLANGIOPANCREATOGRAPHY (ERCP);  Surgeon: Lucilla Lame, MD;  Location: [facility] ENDOSCOPY;  Service: Endoscopy;  Laterality: N/A;   ERCP N/A 10/18/2020   Procedure: ENDOSCOPIC RETROGRADE CHOLANGIOPANCREATOGRAPHY (ERCP) STENT REMOVAL;  Surgeon: Lucilla Lame, MD;  Location: [facility] ENDOSCOPY;  Service: Endoscopy;  Laterality: N/A;   ESOPHAGOGASTRODUODENOSCOPY     FRACTURE SURGERY     femur fracture left - 2019   LEG SURGERY     Family History:  Family History   Problem Relation Age of Onset   Other Neg Hx    Family Psychiatric  History: Denies Social History:  Social History   Substance and Sexual Activity  Alcohol Use Yes   Comment: last drink 03/2020     Social History   Substance and Sexual Activity  Drug Use Yes   Types: Marijuana    Social History   Socioeconomic History   Marital status: Married    Spouse name: Not on file   Number of children: Not on file   Years of education: Not on file   Highest education level: Not on file  Occupational History   Not on file  Tobacco Use   Smoking status: Every Day    Packs/day: 1.00    Types: Cigarettes   Smokeless tobacco: Never  Vaping Use   Vaping Use: Some days  Substance and Sexual Activity   Alcohol use: Yes    Comment: last drink 03/2020   Drug use: Yes    Types: Marijuana   Sexual activity: Not on file  Other Topics Concern   Not on file  Social History Narrative   Not on file   Social Determinants of Health   Financial Resource Strain: Not on file  Food Insecurity: Not on file  Transportation Needs: Not on file  Physical Activity: Not on file  Stress: Not on file  Social Connections: Not on file   Additional Social History:    Allergies:   Allergies  Allergen Reactions   Cetirizine Hives   Diazepam Other (See Comments)    Depression    Baclofen Anxiety and Other (See Comments)    AMS - "Really messed me up, caused me to drop things"     Labs:  Results for orders placed or performed during the hospital encounter of 09/11/21 (from the past 48 hour(s))  Comprehensive metabolic panel     Status: Abnormal   Collection Time: 09/12/21 11:08 PM  Result Value Ref Range   Sodium 130 (L) 135 - 145 mmol/L   Potassium 3.1 (L) 3.5 - 5.1 mmol/L   Chloride 110 98 - 111 mmol/L   CO2 11 (L) 22 - 32 mmol/L   Glucose, Bld 138 (H) 70 - 99 mg/dL    Comment: Glucose reference range applies only to samples taken after fasting for at least 8 hours.   BUN 67 (H) 6  - 20 mg/dL   Creatinine, Ser 2.40 (H) 0.44 - 1.00 mg/dL   Calcium 7.9 (L) 8.9 - 10.3 mg/dL   Total Protein 5.3 (L) 6.5 - 8.1 g/dL   Albumin 2.6 (L) 3.5 - 5.0 g/dL   AST 17 15 - 41 U/L   ALT 14 0 - 44 U/L   Alkaline Phosphatase 94 38 - 126 U/L   Total Bilirubin 0.4 0.3 - 1.2 mg/dL   GFR, Estimated 24 (L) >60 mL/min    Comment: (NOTE) Calculated using the CKD-EPI Creatinine Equation (2021)    Anion  gap 9 5 - 15    Comment: Performed at [facility], [address] [place], [place] [address]  Magnesium     Status: None   Collection Time: 09/12/21 11:08 PM  Result Value Ref Range   Magnesium 1.8 1.7 - 2.4 mg/dL    Comment: Performed at [facility], [address] [place], [place] [address]  Basic metabolic panel     Status: Abnormal   Collection Time: 09/13/21  3:42 AM  Result Value Ref Range   Sodium 132 (L) 135 - 145 mmol/L   Potassium 4.1 3.5 - 5.1 mmol/L   Chloride 111 98 - 111 mmol/L   CO2 13 (L) 22 - 32 mmol/L   Glucose, Bld 90 70 - 99 mg/dL    Comment: Glucose reference range applies only to samples taken after fasting for at least 8 hours.   BUN 65 (H) 6 - 20 mg/dL   Creatinine, Ser 2.37 (H) 0.44 - 1.00 mg/dL   Calcium 7.7 (L) 8.9 - 10.3 mg/dL   GFR, Estimated 25 (L) >60 mL/min    Comment: (NOTE) Calculated using the CKD-EPI Creatinine Equation (2021)    Anion gap 8 5 - 15    Comment: Performed at [facility], [address] [place], [place] [address]  Basic metabolic panel     Status: Abnormal   Collection Time: 09/14/21  4:26 AM  Result Value Ref Range   Sodium 134 (L) 135 - 145 mmol/L   Potassium 3.3 (L) 3.5 - 5.1 mmol/L   Chloride 104 98 - 111 mmol/L   CO2 21 (L) 22 - 32 mmol/L   Glucose, Bld 99 70 - 99 mg/dL    Comment: Glucose reference range applies only to samples taken after fasting for at least 8 hours.   BUN 55 (H) 6 - 20 mg/dL   Creatinine, Ser 2.04 (H) 0.44 - 1.00 mg/dL   Calcium 7.6 (L) 8.9 - 10.3 mg/dL   GFR,  Estimated 29 (L) >60 mL/min    Comment: (NOTE) Calculated using the CKD-EPI Creatinine Equation (2021)    Anion gap 9 5 - 15    Comment: Performed at [facility], [address], [place], [place] [address]  Magnesium     Status: Abnormal   Collection Time: 09/14/21  4:26 AM  Result Value Ref Range   Magnesium 1.6 (L) 1.7 - 2.4 mg/dL    Comment: Performed at [facility], [address] [place], [place] [address]    Current Facility-Administered Medications  Medication Dose Route Frequency Provider Last Rate Last Admin   acetaminophen (TYLENOL) tablet 650 mg  650 mg Oral Q6H PRN Mansy, Jan A, MD   650 mg at 09/12/21 2301   Or   acetaminophen (TYLENOL) suppository 650 mg  650 mg Rectal Q6H PRN Mansy, Jan A, MD       albuterol (PROVENTIL) (2.5 MG/3ML) 0.083% nebulizer solution 2.5 mg  2.5 mg Inhalation Q4H PRN Mansy, Jan A, MD       brimonidine (ALPHAGAN) 0.2 % ophthalmic solution 1 drop  1 drop Both Eyes Daily Mansy, Jan A, MD   1 drop at 09/14/21 0858   doxepin (SINEQUAN) capsule 50 mg  50 mg Oral QHS Mansy, Jan A, MD   50 mg at 09/13/21 2056   DULoxetine (CYMBALTA) DR capsule 40 mg  40 mg Oral q AM Mansy, Jan A, MD   40 mg at 09/14/21 0601   enoxaparin (LOVENOX) injection 30 mg  30 mg Subcutaneous Q24H Mansy, Jan A, MD   30 mg at 09/14/21 0856   famotidine (PEPCID) tablet 20 mg  20 mg Oral QHS Sreenath, Sudheer B, MD   20 mg at 09/13/21 2056   fluocinonide cream (LIDEX) 3.54 % 1 application.  1 application. Topical BID PRN Mansy, Jan A, MD       fluticasone [facility]) 50 MCG/ACT nasal spray 1-2 spray  1-2 spray Each Nare BID PRN Mansy, Jan A, MD       leflunomide (ARAVA) tablet 20 mg  20 mg Oral Daily Mansy, Jan A, MD   20 mg at 09/14/21 0858   loratadine (CLARITIN) tablet 10 mg  10 mg Oral Daily Mansy, Jan A, MD   10 mg at 09/14/21 0856   magnesium hydroxide (MILK OF MAGNESIA) suspension 30 mL  30 mL Oral Daily PRN Mansy, Jan A, MD       melatonin tablet 5 mg  5 mg  Oral QHS Mansy, Jan A, MD   5 mg at 09/13/21 2056   [START ON 09/19/2021] mometasone-formoterol (DULERA) 200-5 MCG/ACT inhaler 2 puff  2 puff Inhalation BID Mansy, Jan A, MD       multivitamin with minerals tablet 1 tablet  1 tablet Oral Daily Wouk, Ailene Rud, MD   1 tablet at 09/14/21 1306   neomycin-polymyxin b-dexamethasone (MAXITROL) ophthalmic suspension 1 drop  1 drop Both Eyes Q6H Mansy, Jan A, MD   1 drop at 09/12/21 1844   oxybutynin (DITROPAN-XL) 24 hr tablet 10 mg  10 mg Oral Q1500 Mansy, Jan A, MD   10 mg at 09/13/21 1730   pantoprazole (PROTONIX) EC tablet 40 mg  40 mg Oral Daily Mansy, Jan A, MD   40 mg at 09/14/21 0856   pentosan polysulfate (ELMIRON) capsule 100 mg  100 mg Oral QHS Mansy, Jan A, MD   100 mg at 09/13/21 2057   potassium chloride (KLOR-CON) packet 40 mEq  40 mEq Oral BID Gwynne Edinger, MD       prednisoLONE acetate (PRED FORTE) 1 % ophthalmic suspension 1 drop  1 drop Both Eyes QID Mansy, Jan A, MD   1 drop at 09/14/21 1306   protein supplement (ENSURE MAX) liquid  11 oz Oral BID Gwynne Edinger, MD   11 oz at 09/14/21 1306   psyllium (HYDROCIL/METAMUCIL) 1 packet  1 packet Oral Daily Mansy, Jan A, MD       sodium bicarbonate 150 mEq in dextrose 5 % 1,150 mL infusion   Intravenous Continuous Colon Flattery, NP 125 mL/hr at 09/14/21 0631 New Bag at 09/14/21 0631   sodium citrate-citric acid (ORACIT) solution 15 mL  15 mL Oral BID Mansy, Jan A, MD   15 mL at 09/14/21 0855   tamsulosin (FLOMAX) capsule 0.4 mg  0.4 mg Oral QHS Mansy, Jan A, MD   0.4 mg at 09/13/21 2056   theophylline (UNIPHYL) 400 MG 24 hr tablet 400 mg  400 mg Oral BID Mansy, Jan A, MD   400 mg at 09/14/21 0856   thiamine tablet 50 mg  50 mg Oral Daily Mansy, Jan A, MD   50 mg at 09/14/21 0856   tiotropium [facility]) inhalation capsule [facility] use ONLY) 18 mcg  1 capsule Inhalation Daily Mansy, Jan A, MD   18 mcg at 09/13/21 1036   traMADol (ULTRAM) tablet 50 mg  50 mg Oral Q6H PRN Mansy, Jan A,  MD   50 mg at 09/13/21 [phone]  traZODone (DESYREL) tablet 25 mg  25 mg Oral QHS PRN Mansy, Jan A, MD   25 mg at 09/12/21 2300   Urelle (URELLE/URISED) 81 MG tablet 81 mg  1 tablet Oral TID PRN Mansy, Arvella Merles, MD        Musculoskeletal: Strength & Muscle Tone: within normal limits Gait & Station: normal Patient leans: N/A            Psychiatric Specialty Exam:  Presentation  General Appearance: No data recorded Eye Contact:No data recorded Speech:No data recorded Speech Volume:No data recorded Handedness:No data recorded  Mood and Affect  Mood:No data recorded Affect:No data recorded  Thought Process  Thought Processes:No data recorded Descriptions of Associations:No data recorded Orientation:No data recorded Thought Content:No data recorded History of Schizophrenia/Schizoaffective disorder:No data recorded Duration of Psychotic Symptoms:No data recorded Hallucinations:No data recorded Ideas of Reference:No data recorded Suicidal Thoughts:No data recorded Homicidal Thoughts:No data recorded  Sensorium  Memory:No data recorded Judgment:No data recorded Insight:No data recorded  Executive Functions  Concentration:No data recorded Attention Span:No data recorded Recall:No data recorded Fund of Knowledge:No data recorded Language:No data recorded  Psychomotor Activity  Psychomotor Activity:No data recorded  Assets  Assets:No data recorded  Sleep  Sleep:No data recorded  Physical Exam: Physical Exam Vitals and nursing note reviewed.  Constitutional:      Appearance: She is underweight.  HENT:     Head: Normocephalic and atraumatic.     Mouth/Throat:     Pharynx: Oropharynx is clear.  Eyes:     Pupils: Pupils are equal, round, and reactive to light.  Cardiovascular:     Rate and Rhythm: Normal rate and regular rhythm.  Pulmonary:     Effort: Pulmonary effort is normal.     Breath sounds: Normal breath sounds.  Abdominal:     General: Abdomen is  flat.     Palpations: Abdomen is soft.  Musculoskeletal:        General: Normal range of motion.  Skin:    General: Skin is warm and dry.  Neurological:     General: No focal deficit present.     Mental Status: She is alert. Mental status is at baseline.  Psychiatric:        Attention and Perception: Attention normal.        Mood and Affect: Mood normal. Affect is blunt.        Speech: Speech normal.        Behavior: Behavior is slowed.        Thought Content: Thought content normal. Thought content does not include suicidal ideation.        Cognition and Memory: Cognition normal.   Review of Systems  Constitutional: Negative.   HENT: Negative.    Eyes: Negative.   Respiratory: Negative.    Cardiovascular: Negative.   Gastrointestinal: Negative.   Musculoskeletal: Negative.   Skin: Negative.   Neurological: Negative.   Psychiatric/Behavioral:  Negative for depression, hallucinations, substance abuse and suicidal ideas. The patient is not nervous/anxious and does not have insomnia.   Blood pressure 118/72, pulse 88, temperature 99.5 F (37.5 C), resp. rate 18, height 5\' 9"  (1.753 m), weight 61.2 kg, SpO2 94 %. Body mass index is 19.94 kg/m.  Treatment Plan Summary: Plan this is a 51 year old woman with a history of a past eating disorder who continues to display disordered eating problems as an adult.  Not clear how reliable her report of her eating is that she gave to me and to the nutritionist  but she does have an appearance of probably being chronically underweight.  More concerning it sounds like she has frequent bouts of stopping eating altogether that put her into potentially life-threatening medical situations.  I pointed all this out to the patient and she seemed to be pretty unconcerned about it which seemed a little strange.  I asked her if she would consider seeing an outpatient mental health provider or even a nutritionist specializing in eating disorders and she was  noncommittal about it.  Patient is not suicidal or psychotic does not require inpatient hospitalization.  Does not require any prescription of any medication at this point.  Does have outpatient medical treatment including a gastroenterologist.  I strongly encouraged the patient however to seek therapy pointing out that eating disorders can be lifelong and continue to cause life threatening problems if not dealt with but that therapy could be helpful.  Probably would be best to seek out someone either a nutritionist or a therapist with a specialty in eating disorders. A good place to start would be with the National eating disorders text message helpline at 1-501-222-8479.  Disposition: No evidence of imminent risk to self or others at present.   Patient does not meet criteria for psychiatric inpatient admission. Supportive therapy provided about ongoing stressors.  Alethia Berthold, MD 09/14/2021 1:30 PM

## 2021-09-14 NOTE — Plan of Care (Signed)
  Problem: Clinical Measurements: Goal: Ability to maintain clinical measurements within normal limits will improve Outcome: Progressing Goal: Will remain free from infection Outcome: Progressing Goal: Diagnostic test results will improve Outcome: Progressing Goal: Respiratory complications will improve Outcome: Progressing Goal: Cardiovascular complication will be avoided Outcome: Progressing    Pt is alert and orientedx4. V/S stable. Feels fatigue. Refused to get up this shift. Needs well attended.

## 2021-09-15 DIAGNOSIS — F509 Eating disorder, unspecified: Secondary | ICD-10-CM

## 2021-09-15 LAB — RENAL FUNCTION PANEL
Albumin: 2.3 g/dL — ABNORMAL LOW (ref 3.5–5.0)
Anion gap: 9 (ref 5–15)
BUN: 46 mg/dL — ABNORMAL HIGH (ref 6–20)
CO2: 28 mmol/L (ref 22–32)
Calcium: 7.7 mg/dL — ABNORMAL LOW (ref 8.9–10.3)
Chloride: 96 mmol/L — ABNORMAL LOW (ref 98–111)
Creatinine, Ser: 1.84 mg/dL — ABNORMAL HIGH (ref 0.44–1.00)
GFR, Estimated: 33 mL/min — ABNORMAL LOW (ref >60)
Glucose, Bld: 98 mg/dL (ref 70–99)
Phosphorus: 3.1 mg/dL (ref 2.5–4.6)
Potassium: 3.3 mmol/L — ABNORMAL LOW (ref 3.5–5.1)
Sodium: 133 mmol/L — ABNORMAL LOW (ref 135–145)

## 2021-09-15 LAB — MAGNESIUM: Magnesium: 1.8 mg/dL (ref 1.7–2.4)

## 2021-09-15 NOTE — Progress Notes (Signed)
[facility]  ROUNDING NOTE   Subjective:   Katherine Perez is a 51 y.o. female with past medical conditions including Raynauds disease, asthma, anemia, GERD, peripheral neuropathy, and chronic kidney diease stage 3b. Patient presents to emergency department with complaints of weakness, altered mental status and lethargy. She is being admitted for Metabolic acidosis [W62.03] AKI (acute kidney injury) [facility]) [N17.9]  Patient is known to our practice and is followed by Dr Holley Raring outpatient. Her last office visit was on 08/02/21.   Update: Patient sitting up in bed, alert and oriented Looks much improved today Tolerating small meals without nausea Reports small bowel movement this morning.  Objective:  Vital signs in last 24 hours:  Temp:  [97.5 F (36.4 C)-99.2 F (37.3 C)] 99.2 F (37.3 C) (05/26 0740) Pulse Rate:  [83-90] 83 (05/26 0740) Resp:  [18-20] 18 (05/26 0740) BP: (109-129)/(66-68) 129/68 (05/26 0740) SpO2:  [91 %-97 %] 91 % (05/26 0740) Weight:  [57.4 kg] 57.4 kg (05/26 0600)  Weight change:  Filed Weights   09/11/21 1351 09/15/21 0600  Weight: 61.2 kg 57.4 kg    Intake/Output: I/O last 3 completed shifts: In: 4899.2 [P.O.:838; I.V.:4011.2; IV Piggyback:50] Out: 1600 [Urine:1600]   Intake/Output this shift:  Total I/O In: 633.3 [I.V.:633.3] Out: -   Physical Exam: General: NAD   Head: Normocephalic, atraumatic.  Moist oral mucosal membranes  Eyes: Anicteric  Lungs:  Clear to auscultation, normal effort, room air  Heart: Regular rate and rhythm  Abdomen:  Soft, nontender  Extremities: No peripheral edema.  Neurologic: Nonfocal, moving all four extremities  Skin: No lesions  Access: None    Basic Metabolic Panel: Recent Labs  Lab 09/11/21 1553 09/12/21 0307 09/12/21 2308 09/13/21 0342 09/14/21 0426 09/15/21 0421  NA  --  132* 130* 132* 134* 133*  K  --  2.9* 3.1* 4.1 3.3* 3.3*  CL  --  114* 110 111 104 96*  CO2  --   8* 11* 13* 21* 28  GLUCOSE  --  86 138* 90 99 98  BUN  --  78* 67* 65* 55* 46*  CREATININE  --  2.72* 2.40* 2.37* 2.04* 1.84*  CALCIUM  --  8.0* 7.9* 7.7* 7.6* 7.7*  MG 2.3  --  1.8  --  1.6* 1.8  PHOS 4.0  --   --   --   --  3.1     Liver Function Tests: Recent Labs  Lab 09/12/21 2308 09/15/21 0421  AST 17  --   ALT 14  --   ALKPHOS 94  --   BILITOT 0.4  --   PROT 5.3*  --   ALBUMIN 2.6* 2.3*    No results for input(s): LIPASE, AMYLASE in the last 168 hours. No results for input(s): AMMONIA in the last 168 hours.  CBC: Recent Labs  Lab 09/11/21 1353 09/12/21 0307  WBC 13.4* 12.0*  HGB 11.7* 10.2*  HCT 35.8* 31.0*  MCV 90.4 90.9  PLT 388 326     Cardiac Enzymes: No results for input(s): CKTOTAL, CKMB, CKMBINDEX, TROPONINI in the last 168 hours.  BNP: Invalid input(s): POCBNP  CBG: No results for input(s): GLUCAP in the last 168 hours.  Microbiology: Results for orders placed or performed during the hospital encounter of 09/11/21  Resp Panel by RT-PCR (Flu A&B, Covid) Nasopharyngeal Swab     Status: None   Collection Time: 09/11/21  9:55 PM   Specimen: Nasopharyngeal Swab; Nasopharyngeal(NP) swabs in vial transport medium  Result Value Ref Range Status   SARS Coronavirus 2 by RT PCR NEGATIVE NEGATIVE Final    Comment: (NOTE) SARS-CoV-2 target nucleic acids are NOT DETECTED.  The SARS-CoV-2 RNA is generally detectable in upper respiratory specimens during the acute phase of infection. The lowest concentration of SARS-CoV-2 viral copies this assay can detect is 138 copies/mL. A negative result does not preclude SARS-Cov-2 infection and should not be used as the sole basis for treatment or other patient management decisions. A negative result may occur with  improper specimen collection/handling, submission of specimen other than nasopharyngeal swab, presence of viral mutation(s) within the areas targeted by this assay, and inadequate number of  viral copies(<138 copies/mL). A negative result must be combined with clinical observations, patient history, and epidemiological information. The expected result is Negative.  Fact Sheet for Patients:  EntrepreneurPulse.com.au  Fact Sheet for Healthcare Providers:  IncredibleEmployment.be  This test is no t yet approved or cleared by the [place] FDA and  has been authorized for detection and/or diagnosis of SARS-CoV-2 by FDA under an Emergency Use Authorization (EUA). This EUA will remain  in effect (meaning this test can be used) for the duration of the COVID-19 declaration under Section 564(b)(1) of the Act, 21 U.S.C.section 360bbb-3(b)(1), unless the authorization is terminated  or revoked sooner.       Influenza A by PCR NEGATIVE NEGATIVE Final   Influenza B by PCR NEGATIVE NEGATIVE Final    Comment: (NOTE) The Xpert Xpress SARS-CoV-2/FLU/RSV plus assay is intended as an aid in the diagnosis of influenza from Nasopharyngeal swab specimens and should not be used as a sole basis for treatment. Nasal washings and aspirates are unacceptable for Xpert Xpress SARS-CoV-2/FLU/RSV testing.  Fact Sheet for Patients: EntrepreneurPulse.com.au  Fact Sheet for Healthcare Providers: IncredibleEmployment.be  This test is not yet approved or cleared by the [place] FDA and has been authorized for detection and/or diagnosis of SARS-CoV-2 by FDA under an Emergency Use Authorization (EUA). This EUA will remain in effect (meaning this test can be used) for the duration of the COVID-19 declaration under Section 564(b)(1) of the Act, 21 U.S.C. section 360bbb-3(b)(1), unless the authorization is terminated or revoked.  Performed at [facility], [address] [place], [place] [address]     Coagulation Studies: No results for input(s): LABPROT, INR in the last 72 hours.  Urinalysis: No  results for input(s): COLORURINE, LABSPEC, PHURINE, GLUCOSEU, HGBUR, BILIRUBINUR, KETONESUR, PROTEINUR, UROBILINOGEN, NITRITE, LEUKOCYTESUR in the last 72 hours.  Invalid input(s): APPERANCEUR     Imaging: No results found.   Medications:      brimonidine  1 drop Both Eyes Daily   doxepin  50 mg Oral QHS   DULoxetine  40 mg Oral q AM   enoxaparin (LOVENOX) injection  40 mg Subcutaneous Q24H   famotidine  20 mg Oral QHS   leflunomide  20 mg Oral Daily   loratadine  10 mg Oral Daily   melatonin  5 mg Oral QHS   [START ON 09/19/2021] mometasone-formoterol  2 puff Inhalation BID   multivitamin with minerals  1 tablet Oral Daily   neomycin-polymyxin b-dexamethasone  1 drop Both Eyes Q6H   oxybutynin  10 mg Oral Q1500   pantoprazole  40 mg Oral Daily   pentosan polysulfate  100 mg Oral QHS   potassium chloride  40 mEq Oral BID   prednisoLONE acetate  1 drop Both Eyes QID   Ensure Max Protein  11 oz Oral BID  psyllium  1 packet Oral Daily   sodium citrate-citric acid  15 mL Oral BID   tamsulosin  0.4 mg Oral QHS   theophylline  400 mg Oral BID   thiamine  50 mg Oral Daily   tiotropium  1 capsule Inhalation Daily   acetaminophen **OR** acetaminophen, albuterol, fluocinonide cream, fluticasone, magnesium hydroxide, traMADol, traZODone, Urelle  Assessment/ Plan:  Katherine Perez is a 51 y.o.  female with past medical conditions including Raynauds disease, asthma, anemia, GERD, peripheral neuropathy, and chronic kidney diease stage 3b. Patient presents to emergency department with complaints of weakness, altered mental status and lethargy. She is being admitted for Metabolic acidosis [G83.66] AKI (acute kidney injury) ([place]) [N17.9]   Acute Kidney Injury with hypokalmia/hyponatremia on chronic kidney disease stage IIIb with baseline creatinine 1.89 and GFR of 32 on 08/08/21.  Acute kidney injury secondary to hypovolemia from nausea, vomiting and diarrhea.  Chronic  kidney disease is secondary to multiple acute kidney injuries CT abd pelvis shows no obstruction. No exposure to IV contrast.   Renal function has returned to baseline.  Potassium remains slightly decreased however oral supplementation ordered.  Sodium levels stabilized.  Patient cleared from renal stance to follow-up with outpatient nephrology.  Lab Results  Component Value Date   CREATININE 1.84 (H) 09/15/2021   CREATININE 2.04 (H) 09/14/2021   CREATININE 2.37 (H) 09/13/2021    Intake/Output Summary (Last 24 hours) at 09/15/2021 1540 Last data filed at 09/15/2021 0909 Gross per 24 hour  Intake 2909.48 ml  Output --  Net 2909.48 ml     2. Acute on chronic metabolic acidosis.  Corrected with sodium bicarb infusion   3. Anemia of chronic kidney disease Lab Results  Component Value Date   HGB 10.2 (L) 09/12/2021    Hemoglobin remains within acceptable target    LOS: 4 Tauriel Scronce 5/26/20233:40 PM

## 2021-09-15 NOTE — Discharge Summary (Signed)
[address] Katherine Perez NFA:[id number] DOB: Aug 20, 1970 DOA: 09/11/2021  PCP: Gladstone Lighter, MD  Admit date: 09/11/2021 Discharge date: 09/15/2021  Time spent: 35 minutes  Recommendations for Outpatient Follow-up:  Pcp f/u 1 week, check of kidney function and metabolic panel then Outpatient eating disorder treatment      Discharge Diagnoses:  Principal Problem:   Eating disorder Active Problems:   AKI (acute kidney injury) ([place])   Acute metabolic encephalopathy   Hypokalemia   Acute metabolic acidosis   Hyponatremia   Asthma, chronic   Peripheral neuropathy   GERD without esophagitis   Rheumatoid arthritis involving multiple sites with positive rheumatoid factor (HCC)   Chronic constipation   Gastroparesis   Anorexia   Protein-calorie malnutrition, severe   Discharge Condition: improved  Diet recommendation: regular  Filed Weights   09/11/21 1351 09/15/21 0600  Weight: 61.2 kg 57.4 kg    History of present illness:  From admission h and p: Katherine Perez is a 51 y.o. female with medical history significant for GERD, peripheral neuropathy, Raynaud's disease, asthma and anemia, who presented to the ER with acute onset of altered mental status with confusion and lethargy.  She admitted to having diarrhea for the last couple days with loose bowel movements.  She admitted to nausea and vomiting once with no melena protruding per rectum.  She denied any bilious vomitus or hematemesis or abdominal pain.  She has mild cough with expectoration of clear mucus.  No dyspnea or wheezing.  No fever or chills.  No chest pain or palpitations.  She admits to urinary urgency with no hematuria or dysuria.  Hospital Course:  Anorexia This is her primary problem and essentially all other problems are downstream consequence of this problem and things she has done ( such as chronic use of stimulant laxitives) in service of this problem. Discussed this with her husband and  he is in agreement. Patient reports treatment for this decades ago. Psychiatry saw, provided patient with resources, impression is that she is not ready to engange in therapy. - continue to address as outpatient   AKI (acute kidney injury) ([place]) on CKD 3b Suspect prerenal azotemia in the setting of decreased PO. Has had similar such presentations in the past. Baseline gfr low 30s with cr in the upper 1s. Kidney function improved to baseline with IVF.    Hypokalemia Hyponatremia Suspect secondary to chronic reduced po. Repleted   Acute metabolic encephalopathy Suspect secondary to uremia also volume depletion. Resolved with bicarb gtt   Acute metabolic acidosis Severe acidosis with bicarbonate of 8 on BMP Nephrology following Started on IV bicarbonate infusion Resolved   Procedures: None    Consultations: Nephrology, psychiatry  Discharge Exam: Vitals:   09/15/21 0404 09/15/21 0740  BP: 126/68 129/68  Pulse: 84 83  Resp: 20 18  Temp: (!) 97.5 F (36.4 C) 99.2 F (37.3 C)  SpO2: 91% 91%    General: NAD Cardiovascular: RRR Respiratory: CTAB Abdomen: soft  Discharge Instructions   Discharge Instructions     Amb Referral to Nutrition and Diabetic Education   Complete by: As directed    Diet general   Complete by: As directed    Increase activity slowly   Complete by: As directed       Allergies as of 09/15/2021       Reactions   Cetirizine Hives   Diazepam Other (See Comments)   Depression   Baclofen Anxiety, Other (See Comments)   AMS - "Really messed  me up, caused me to drop things"        Medication List     TAKE these medications    Adbry 150 MG/ML Sosy Generic drug: Tralokinumab-ldrm Inject into the skin.   albuterol 108 (90 Base) MCG/ACT inhaler Commonly known as: VENTOLIN HFA Inhale 2 puffs into the lungs every 4 (four) hours as needed for shortness of breath or wheezing.   amitriptyline 25 MG tablet Commonly known as: ELAVIL Take 50  mg by mouth at bedtime.   Brimonidine Tartrate 0.025 % Soln Place 1 drop into both eyes daily.   Difluprednate 0.05 % Emul Apply 1 drop to eye daily.   doxepin 50 MG capsule Commonly known as: SINEQUAN Take 50 mg by mouth at bedtime.   DULoxetine 20 MG capsule Commonly known as: CYMBALTA Take 40 mg by mouth in the morning.   DUPIXENT [place] Inject 300 mg into the skin every 14 (fourteen) days. (Fridays)   famotidine 40 MG tablet Commonly known as: PEPCID Take 40 mg by mouth at bedtime.   fexofenadine 180 MG tablet Commonly known as: ALLEGRA Take 180 mg by mouth daily as needed for allergies.   FIBER PO Take 1 capsule by mouth daily.   fluocinonide cream 0.05 % Commonly known as: LIDEX Apply 1 application topically 2 (two) times daily as needed (skin irritations).   fluticasone 50 MCG/ACT nasal spray Commonly known as: FLONASE Place 1-2 sprays into both nostrils 2 (two) times daily as needed for allergies or rhinitis.   Fluticasone-Salmeterol 250-50 MCG/DOSE Aepb Commonly known as: ADVAIR Inhale 1 puff into the lungs 2 (two) times daily.   gabapentin 300 MG capsule Commonly known as: NEURONTIN Take 1 capsule (300 mg total) by mouth 2 (two) times daily.   leflunomide 20 MG tablet Commonly known as: ARAVA Take 20 mg by mouth daily.   medroxyPROGESTERone 150 MG/ML injection Commonly known as: DEPO-PROVERA Inject 150 mg into the muscle every 3 (three) months.   melatonin 3 MG Tabs tablet Take 2 tablets (6 mg total) by mouth at bedtime.   metoCLOPramide 10 MG tablet Commonly known as: REGLAN Take 10 mg by mouth in the morning and at bedtime.   neomycin-polymyxin b-dexamethasone 3.5-10000-0.1 Susp Commonly known as: MAXITROL Place 1 drop into both eyes every 6 (six) hours.   oxybutynin 10 MG 24 hr tablet Commonly known as: DITROPAN-XL Take 10 mg by mouth daily in the afternoon.   pentosan polysulfate 100 MG capsule Commonly known as: ELMIRON Take 100 mg by  mouth at bedtime.   Plecanatide 3 MG Tabs Take 3 mg by mouth daily at 6 (six) AM.   potassium chloride SA 20 MEQ tablet Commonly known as: KLOR-CON M Take 1 tablet (20 mEq total) by mouth 2 (two) times daily.   prednisoLONE acetate 1 % ophthalmic suspension Commonly known as: PRED FORTE Place 1 drop into both eyes 4 (four) times daily.   promethazine 25 MG tablet Commonly known as: PHENERGAN Take 25 mg by mouth every 8 (eight) hours as needed for nausea or vomiting.   RABEprazole 20 MG tablet Commonly known as: ACIPHEX Take 20 mg by mouth in the morning and at bedtime.   sodium citrate-citric acid 500-334 MG/5ML solution Commonly known as: ORACIT Take 15 mLs by mouth in the morning and at bedtime.   Spiriva Respimat 2.5 MCG/ACT Aers Generic drug: Tiotropium Bromide Monohydrate SMARTSIG:2 Puff(s) By Mouth Daily   tamsulosin 0.4 MG Caps capsule Commonly known as: FLOMAX Take 0.4 mg by mouth  at bedtime.   theophylline 400 MG 24 hr tablet Commonly known as: UNIPHYL Take 400 mg by mouth 2 (two) times daily.   thiamine 50 MG tablet Commonly known as: VITAMIN B-1 Take 50 mg by mouth daily.   traMADol 50 MG tablet Commonly known as: ULTRAM Take 50 mg by mouth every 6 (six) hours as needed for moderate pain.   Uribel 118 MG Caps Take 1 capsule by mouth 3 (three) times daily as needed (Urinary symptoms).       Allergies  Allergen Reactions   Cetirizine Hives   Diazepam Other (See Comments)    Depression    Baclofen Anxiety and Other (See Comments)    AMS - "Really messed me up, caused me to drop things"     Follow-up Information     Gladstone Lighter, MD Follow up.   Specialty: Internal Medicine Contact information: [place] [address] [phone]                  The results of significant diagnostics from this hospitalization (including imaging, microbiology, ancillary and laboratory) are listed below for reference.     Significant Diagnostic Studies: CT ABDOMEN PELVIS WO CONTRAST  Result Date: 09/12/2021 CLINICAL DATA:  Abdominal pain, nausea, vomiting EXAM: CT ABDOMEN AND PELVIS WITHOUT CONTRAST TECHNIQUE: Multidetector CT imaging of the abdomen and pelvis was performed following the standard protocol without IV contrast. RADIATION DOSE REDUCTION: This exam was performed according to the departmental dose-optimization program which includes automated exposure control, adjustment of the mA and/or kV according to patient size and/or use of iterative reconstruction technique. COMPARISON:  07/04/2020 FINDINGS: Lower chest: Small linear densities in the posterior lower lung fields may suggest scarring or subsegmental atelectasis. Breathing motion artifacts limit evaluation of lung fields. Minimal pleural effusions are seen. Scattered coronary artery calcifications are seen. Hepatobiliary: There is coarse calcification in the right lobe with no significant interval change. There is no significant dilation of bile ducts. Gallbladder is not seen. Pancreas: No focal abnormality is seen. Spleen: Unremarkable. Adrenals/Urinary Tract: Adrenals are unremarkable. There is no hydronephrosis. There are tiny calcific densities in the renal cortex in both kidneys. No definite renal stones are seen. Ureters are not dilated. Urinary bladder is distended. There is no wall thickening in the bladder. Stomach/Bowel: Stomach is moderately distended. There is wall thickening in the stomach. Duodenum is distended. There is mild to moderate dilation of small-bowel loops. There is mild diffuse wall thickening in the small bowel loops. Appendix is not dilated. There is high density in the lumen of appendix. There is no focal pericecal inflammation. There is fluid in the lumen of colon. There is gaseous distention of transverse colon. There is no focal wall thickening in the colon. Vascular/Lymphatic: Calcifications are seen in aorta and its major  branches. Reproductive: Uterus is difficult to visualize. No dominant adnexal masses are seen. Other: There is no pneumoperitoneum. There is minimal ascites in the pelvis. Musculoskeletal: There is minimal anterolisthesis at L4-L5 level along with disc space narrowing. IMPRESSION: There is no evidence of intestinal obstruction or pneumoperitoneum. There is no hydronephrosis. There is wall thickening in the stomach and small bowel loops. There is fluid in the dilated small bowel loops. There is fluid in the lumen of colon. There is minimal ascites. Findings suggest possible gastritis and enterocolitis. Electronically Signed   By: Elmer Picker M.D.   On: 09/12/2021 10:10   DG Chest 1 View  Result Date: 09/11/2021 CLINICAL DATA:  [id number] EXAM: CHEST  1 VIEW COMPARISON:  None Available. FINDINGS: Lungs are well expanded, symmetric, and clear. No pneumothorax or pleural effusion. Cardiac size within normal limits. Pulmonary vascularity is normal. Osseous structures are age-appropriate. No acute bone abnormality. IMPRESSION: No active disease. Electronically Signed   By: Fidela Salisbury M.D.   On: 09/11/2021 23:19    Microbiology: Recent Results (from the past 240 hour(s))  Resp Panel by RT-PCR (Flu A&B, Covid) Nasopharyngeal Swab     Status: None   Collection Time: 09/11/21  9:55 PM   Specimen: Nasopharyngeal Swab; Nasopharyngeal(NP) swabs in vial transport medium  Result Value Ref Range Status   SARS Coronavirus 2 by RT PCR NEGATIVE NEGATIVE Final    Comment: (NOTE) SARS-CoV-2 target nucleic acids are NOT DETECTED.  The SARS-CoV-2 RNA is generally detectable in upper respiratory specimens during the acute phase of infection. The lowest concentration of SARS-CoV-2 viral copies this assay can detect is 138 copies/mL. A negative result does not preclude SARS-Cov-2 infection and should not be used as the sole basis for treatment or other patient management decisions. A negative result may occur  with  improper specimen collection/handling, submission of specimen other than nasopharyngeal swab, presence of viral mutation(s) within the areas targeted by this assay, and inadequate number of viral copies(<138 copies/mL). A negative result must be combined with clinical observations, patient history, and epidemiological information. The expected result is Negative.  Fact Sheet for Patients:  EntrepreneurPulse.com.au  Fact Sheet for Healthcare Providers:  IncredibleEmployment.be  This test is no t yet approved or cleared by the [place] FDA and  has been authorized for detection and/or diagnosis of SARS-CoV-2 by FDA under an Emergency Use Authorization (EUA). This EUA will remain  in effect (meaning this test can be used) for the duration of the COVID-19 declaration under Section 564(b)(1) of the Act, 21 U.S.C.section 360bbb-3(b)(1), unless the authorization is terminated  or revoked sooner.       Influenza A by PCR NEGATIVE NEGATIVE Final   Influenza B by PCR NEGATIVE NEGATIVE Final    Comment: (NOTE) The Xpert Xpress SARS-CoV-2/FLU/RSV plus assay is intended as an aid in the diagnosis of influenza from Nasopharyngeal swab specimens and should not be used as a sole basis for treatment. Nasal washings and aspirates are unacceptable for Xpert Xpress SARS-CoV-2/FLU/RSV testing.  Fact Sheet for Patients: EntrepreneurPulse.com.au  Fact Sheet for Healthcare Providers: IncredibleEmployment.be  This test is not yet approved or cleared by the [place] FDA and has been authorized for detection and/or diagnosis of SARS-CoV-2 by FDA under an Emergency Use Authorization (EUA). This EUA will remain in effect (meaning this test can be used) for the duration of the COVID-19 declaration under Section 564(b)(1) of the Act, 21 U.S.C. section 360bbb-3(b)(1), unless the authorization is terminated  or revoked.  Performed at [facility], [address] [place], [place] [address]      Labs: Basic Metabolic Panel: Recent Labs  Lab 09/11/21 1553 09/12/21 0307 09/12/21 2308 09/13/21 0342 09/14/21 0426 09/15/21 0421  NA  --  132* 130* 132* 134* 133*  K  --  2.9* 3.1* 4.1 3.3* 3.3*  CL  --  114* 110 111 104 96*  CO2  --  8* 11* 13* 21* 28  GLUCOSE  --  86 138* 90 99 98  BUN  --  78* 67* 65* 55* 46*  CREATININE  --  2.72* 2.40* 2.37* 2.04* 1.84*  CALCIUM  --  8.0* 7.9* 7.7* 7.6* 7.7*  MG 2.3  --  1.8  --  1.6* 1.8  PHOS 4.0  --   --   --   --  3.1   Liver Function Tests: Recent Labs  Lab 09/12/21 2308 09/15/21 0421  AST 17  --   ALT 14  --   ALKPHOS 94  --   BILITOT 0.4  --   PROT 5.3*  --   ALBUMIN 2.6* 2.3*   No results for input(s): LIPASE, AMYLASE in the last 168 hours. No results for input(s): AMMONIA in the last 168 hours. CBC: Recent Labs  Lab 09/11/21 1353 09/12/21 0307  WBC 13.4* 12.0*  HGB 11.7* 10.2*  HCT 35.8* 31.0*  MCV 90.4 90.9  PLT 388 326   Cardiac Enzymes: No results for input(s): CKTOTAL, CKMB, CKMBINDEX, TROPONINI in the last 168 hours. BNP: BNP (last 3 results) No results for input(s): BNP in the last 8760 hours.  ProBNP (last 3 results) No results for input(s): PROBNP in the last 8760 hours.  CBG: No results for input(s): GLUCAP in the last 168 hours.     Signed:  Desma Maxim MD.  [facility] 09/15/2021, 10:21 AM

## 2021-09-15 NOTE — Progress Notes (Signed)
Discharge instructions reviewed with patient and husband including followup visits.  Understanding was verbalized and all questions were answered.  IV removed without complication; patient tolerated well.  Patient discharged home via wheelchair in stable condition escorted by volunteer staff.

## 2021-10-31 ENCOUNTER — Emergency Department
Admission: EM | Admit: 2021-10-31 | Discharge: 2021-10-31 | Disposition: A | Discharge status: Home / Self Care | Payer: BC Managed Care – PPO | Attending: Emergency Medicine | Admitting: Emergency Medicine

## 2021-10-31 ENCOUNTER — Emergency Department: Payer: BC Managed Care – PPO

## 2021-10-31 ENCOUNTER — Other Ambulatory Visit: Payer: Self-pay

## 2021-10-31 DIAGNOSIS — M7989 Other specified soft tissue disorders: Secondary | ICD-10-CM | POA: Diagnosis present

## 2021-10-31 DIAGNOSIS — N184 Chronic kidney disease, stage 4 (severe): Secondary | ICD-10-CM | POA: Diagnosis not present

## 2021-10-31 DIAGNOSIS — M7121 Synovial cyst of popliteal space [Baker], right knee: Secondary | ICD-10-CM | POA: Insufficient documentation

## 2021-10-31 DIAGNOSIS — J45909 Unspecified asthma, uncomplicated: Secondary | ICD-10-CM | POA: Diagnosis not present

## 2021-10-31 NOTE — ED Provider Notes (Signed)
[facility] Provider Note    Event Date/Time   First MD Initiated Contact with Patient 10/31/21 1246     (approximate)   History   Leg Swelling (R)   HPI  Katherine Perez is a 51 y.o. female with a past medical history of peripheral neuropathy, asthma, rheumatoid arthritis, who presents today for evaluation of leg swelling.  Patient had a recent admission from 5/22 until 5/26 for AKI and acute metabolic encephalopathy.  Patient reports that she began to have swelling after her admission.  She reports that she has bilateral knee pain which is her baseline.  She has not had a history of PE/DVT.  She denies chest pain or shortness of breath.  Patient Active Problem List   Diagnosis Date Noted   Eating disorder 09/14/2021   Protein-calorie malnutrition, severe 09/14/2021   Gastroparesis 09/13/2021   Anorexia 02/06/5101   Acute metabolic acidosis 58/52/7782   Asthma, chronic 09/12/2021   Peripheral neuropathy 09/12/2021   Decubitus ulcer of coccyx, stage 2 ([place]) 05/15/2021   Neuropathy    Chronic constipation    Rheumatoid arthritis involving multiple sites with positive rheumatoid factor ([place]) 10/27/2020   Encounter for adjustment or removal of myringotomy device (stent) (tube)    IDA (iron deficiency anemia) 10/07/2020   AKI (acute kidney injury) ([place]) 42/35/3614   Metabolic acidosis 43/15/4008   Other specified diseases of biliary tract    Disease of biliary tract, unspecified    Bile leak 06/30/2020   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   Hyperkalemia 06/27/2020   CKD (chronic kidney disease), stage IV ([place]) 06/27/2020   Acute pancreatitis without infection or necrosis 04/23/2020   GERD without esophagitis 04/23/2020   Hypokalemia 04/23/2020   Prolonged QT interval 04/23/2020   Hypercalcemia    Pressure injury of skin 10/27/2019   Hyponatremia 10/26/2019   Increased anion gap metabolic acidosis 67/61/9509   Acute renal failure  superimposed on stage 4 chronic kidney disease ([place]) 32/67/1245   Acute metabolic encephalopathy 80/99/8338   Leukocytosis 10/26/2019   Slow transit constipation 09/03/2018   Laxative habit 12/31/2014   Chronic interstitial cystitis 11/18/2014          Physical Exam   Triage Vital Signs: ED Triage Vitals  Enc Vitals Group     BP 10/31/21 1237 132/76     Pulse Rate 10/31/21 1237 85     Resp 10/31/21 1237 17     Temp 10/31/21 1237 98 F (36.7 C)     Temp Source 10/31/21 1237 Oral     SpO2 10/31/21 1237 99 %     Weight 10/31/21 1238 125 lb (56.7 kg)     Height 10/31/21 1238 5\' 9"  (1.753 m)     Head Circumference --      Peak Flow --      Pain Score 10/31/21 1238 3     Pain Loc --      Pain Edu? --      Excl. in [place]? --     Most recent vital signs: Vitals:   10/31/21 1237 10/31/21 1458  BP: 132/76 132/82  Pulse: 85 85  Resp: 17 16  Temp: 98 F (36.7 C)   SpO2: 99% 100%    Physical Exam Vitals and nursing note reviewed.  Constitutional:      General: Awake and alert. No acute distress.    Appearance: Normal appearance. The patient is normal weight.  HENT:     Head: Normocephalic and atraumatic.  Mouth: Mucous membranes are moist.  Eyes:     General: PERRL. Normal EOMs        Right eye: No discharge.        Left eye: No discharge.     Conjunctiva/sclera: Conjunctivae normal.  Cardiovascular:     Rate and Rhythm: Normal rate and regular rhythm.     Pulses: Normal pulses.  Pulmonary:     Effort: Pulmonary effort is normal. No respiratory distress.  Abdominal:     Abdomen is soft. There is no abdominal tenderness. Musculoskeletal:        General: No swelling. Normal range of motion.     Cervical back: Normal range of motion and neck supple.  Swelling noted to the right lower extremity below the knee.  No calf tenderness.  Normal pedal pulses.  Normal range of motion of ankle and knee with AROM and PROM.  No pitting edema.  No erythema.  No wounds.   Compartment soft and compressible throughout.  Full and normal range of motion with active and passive range of motion of the knee without effusion or erythema.  No warmth Skin:    General: Skin is warm and dry.     Capillary Refill: Capillary refill takes less than 2 seconds.     Findings: No rash.  Neurological:     Mental Status: The patient is awake and alert.      ED Results / Procedures / Treatments   Labs (all labs ordered are listed, but only abnormal results are displayed) Labs Reviewed - No data to display   EKG     RADIOLOGY     PROCEDURES:  Critical Care performed:   Procedures   MEDICATIONS ORDERED IN ED: Medications - No data to display   IMPRESSION / MDM / [place] / ED COURSE  I reviewed the triage vital signs and the nursing notes.   Differential diagnosis includes, but is not limited to, DVT, venous insufficiency, Baker's cyst.  Swelling is not bilateral, patient does not appear to be volume overloaded.  Patient is awake and alert, hemodynamically stable and afebrile.  She does have swelling to her right lower extremity without pitting edema.  No skin color changes.  Patient declined blood work.  Ultrasound of her right lower extremity reveals a Baker's cyst.  No DVT.  There is no warmth, wounds, or infectious signs or symptoms.  We discussed this finding.  We discussed symptomatic management.  Discussed return precautions and outpatient management.  Patient understands and agrees with plan.  Discharged in stable condition.   Patient's presentation is most consistent with acute complicated illness / injury requiring diagnostic workup.       FINAL CLINICAL IMPRESSION(S) / ED DIAGNOSES   Final diagnoses:  Right leg swelling  Baker cyst, right     Rx / DC Orders   ED Discharge Orders     None        Note:  This document was prepared using Dragon voice recognition software and may include unintentional dictation errors.    Marquette Old, PA-C 10/31/21 1757    Naaman Plummer, MD 11/04/21 [phone]

## 2021-10-31 NOTE — ED Triage Notes (Signed)
Pt presents to ED by PCP with c/o of R lower leg swelling and was sent to ER to R/O DVT. Pt states swelling has been going on for 3 weeks.

## 2021-10-31 NOTE — Discharge Instructions (Signed)
Your ultrasound did not show any blood clots.  It did reveal a Baker's cyst.  Rest, ice, elevate your leg.  Please return for any new, worsening, or change in symptoms or other concerns.  It was a pleasure caring for you today.

## 2021-11-06 ENCOUNTER — Inpatient Hospital Stay: Payer: BC Managed Care – PPO

## 2021-11-06 ENCOUNTER — Emergency Department: Payer: BC Managed Care – PPO

## 2021-11-06 ENCOUNTER — Inpatient Hospital Stay (HOSPITAL_COMMUNITY)
Admit: 2021-11-06 | Discharge: 2021-11-06 | Disposition: A | Discharge status: Home / Self Care | Payer: BC Managed Care – PPO | Attending: Internal Medicine | Admitting: Internal Medicine

## 2021-11-06 ENCOUNTER — Inpatient Hospital Stay
Admission: EM | Admit: 2021-11-06 | Discharge: 2021-11-21 | DRG: 871 | Disposition: E | Payer: BC Managed Care – PPO | Attending: Internal Medicine | Admitting: Internal Medicine

## 2021-11-06 DIAGNOSIS — J441 Chronic obstructive pulmonary disease with (acute) exacerbation: Secondary | ICD-10-CM | POA: Diagnosis present

## 2021-11-06 DIAGNOSIS — J9602 Acute respiratory failure with hypercapnia: Secondary | ICD-10-CM | POA: Diagnosis present

## 2021-11-06 DIAGNOSIS — I5021 Acute systolic (congestive) heart failure: Secondary | ICD-10-CM | POA: Diagnosis present

## 2021-11-06 DIAGNOSIS — G9341 Metabolic encephalopathy: Secondary | ICD-10-CM | POA: Diagnosis present

## 2021-11-06 DIAGNOSIS — J189 Pneumonia, unspecified organism: Secondary | ICD-10-CM | POA: Diagnosis present

## 2021-11-06 DIAGNOSIS — J9601 Acute respiratory failure with hypoxia: Secondary | ICD-10-CM

## 2021-11-06 DIAGNOSIS — R0603 Acute respiratory distress: Secondary | ICD-10-CM | POA: Diagnosis not present

## 2021-11-06 DIAGNOSIS — J9690 Respiratory failure, unspecified, unspecified whether with hypoxia or hypercapnia: Secondary | ICD-10-CM | POA: Diagnosis present

## 2021-11-06 DIAGNOSIS — N179 Acute kidney failure, unspecified: Secondary | ICD-10-CM | POA: Diagnosis present

## 2021-11-06 DIAGNOSIS — A419 Sepsis, unspecified organism: Principal | ICD-10-CM

## 2021-11-06 DIAGNOSIS — R451 Restlessness and agitation: Secondary | ICD-10-CM | POA: Diagnosis present

## 2021-11-06 DIAGNOSIS — D649 Anemia, unspecified: Secondary | ICD-10-CM | POA: Diagnosis present

## 2021-11-06 DIAGNOSIS — E872 Acidosis, unspecified: Secondary | ICD-10-CM | POA: Diagnosis not present

## 2021-11-06 DIAGNOSIS — I255 Ischemic cardiomyopathy: Secondary | ICD-10-CM | POA: Diagnosis present

## 2021-11-06 DIAGNOSIS — E8721 Acute metabolic acidosis: Secondary | ICD-10-CM | POA: Diagnosis present

## 2021-11-06 DIAGNOSIS — Z66 Do not resuscitate: Secondary | ICD-10-CM | POA: Diagnosis present

## 2021-11-06 DIAGNOSIS — R6521 Severe sepsis with septic shock: Secondary | ICD-10-CM | POA: Diagnosis present

## 2021-11-06 DIAGNOSIS — R778 Other specified abnormalities of plasma proteins: Secondary | ICD-10-CM

## 2021-11-06 DIAGNOSIS — R230 Cyanosis: Secondary | ICD-10-CM | POA: Diagnosis present

## 2021-11-06 DIAGNOSIS — R7989 Other specified abnormal findings of blood chemistry: Secondary | ICD-10-CM

## 2021-11-06 DIAGNOSIS — R0902 Hypoxemia: Secondary | ICD-10-CM

## 2021-11-06 DIAGNOSIS — R220 Localized swelling, mass and lump, head: Secondary | ICD-10-CM

## 2021-11-06 LAB — CBC WITH DIFFERENTIAL/PLATELET
Abs Immature Granulocytes: 0.05 10*3/uL (ref 0.00–0.07)
Basophils Absolute: 0 10*3/uL (ref 0.0–0.1)
Basophils Relative: 0 %
Eosinophils Absolute: 0.3 10*3/uL (ref 0.0–0.5)
Eosinophils Relative: 4 %
HCT: 29.7 % — ABNORMAL LOW (ref 36.0–46.0)
Hemoglobin: 9.4 g/dL — ABNORMAL LOW (ref 12.0–15.0)
Immature Granulocytes: 1 %
Lymphocytes Relative: 1 %
Lymphs Abs: 0.1 10*3/uL — ABNORMAL LOW (ref 0.7–4.0)
MCH: 32.2 pg (ref 26.0–34.0)
MCHC: 31.6 g/dL (ref 30.0–36.0)
MCV: 101.7 fL — ABNORMAL HIGH (ref 80.0–100.0)
Monocytes Absolute: 0.2 10*3/uL (ref 0.1–1.0)
Monocytes Relative: 2 %
Neutro Abs: 6.3 10*3/uL (ref 1.7–7.7)
Neutrophils Relative %: 92 %
Platelets: 195 10*3/uL (ref 150–400)
RBC: 2.92 MIL/uL — ABNORMAL LOW (ref 3.87–5.11)
RDW: 15.6 % — ABNORMAL HIGH (ref 11.5–15.5)
Smear Review: NORMAL
WBC: 6.8 10*3/uL (ref 4.0–10.5)
nRBC: 0 % (ref 0.0–0.2)

## 2021-11-06 LAB — URINALYSIS, COMPLETE (UACMP) WITH MICROSCOPIC
Bilirubin Urine: NEGATIVE
Glucose, UA: NEGATIVE mg/dL
Ketones, ur: NEGATIVE mg/dL
Nitrite: POSITIVE — AB
Protein, ur: 100 mg/dL — AB
Specific Gravity, Urine: 1.01 (ref 1.005–1.030)
WBC, UA: >50 WBC/hpf — ABNORMAL HIGH (ref 0–5)
pH: 5 (ref 5.0–8.0)

## 2021-11-06 LAB — ECHOCARDIOGRAM COMPLETE
AR max vel: 0.94 cm2
AV Area VTI: 0.8 cm2
AV Area mean vel: 0.91 cm2
AV Mean grad: 15 mmHg
AV Peak grad: 19.9 mmHg
Ao pk vel: 2.23 m/s
Area-P 1/2: 4.36 cm2
Height: 69 in
P 1/2 time: 391 msec
S' Lateral: 3.64 cm
Weight: 2028.23 oz

## 2021-11-06 LAB — BLOOD GAS, ARTERIAL
Acid-base deficit: 23.7 mmol/L — ABNORMAL HIGH (ref 0.0–2.0)
Bicarbonate: 8.6 mmol/L — ABNORMAL LOW (ref 20.0–28.0)
FIO2: 100 %
MECHVT: 420 mL
Mechanical Rate: 22
O2 Saturation: 73.7 %
PEEP: 13 cmH2O
Patient temperature: 37
pCO2 arterial: 46 mmHg (ref 32–48)
pH, Arterial: <6.95 — CL (ref 7.35–7.45)
pO2, Arterial: 55 mmHg — ABNORMAL LOW (ref 83–108)

## 2021-11-06 LAB — URINE DRUG SCREEN, QUALITATIVE (ARMC ONLY)
Amphetamines, Ur Screen: NOT DETECTED
Barbiturates, Ur Screen: NOT DETECTED
Benzodiazepine, Ur Scrn: POSITIVE — AB
Cannabinoid 50 Ng, Ur ~~LOC~~: POSITIVE — AB
Cocaine Metabolite,Ur ~~LOC~~: NOT DETECTED
MDMA (Ecstasy)Ur Screen: NOT DETECTED
Methadone Scn, Ur: NOT DETECTED
Opiate, Ur Screen: NOT DETECTED
Phencyclidine (PCP) Ur S: NOT DETECTED
Tricyclic, Ur Screen: POSITIVE — AB

## 2021-11-06 LAB — AMYLASE: Amylase: 40 U/L (ref 28–100)

## 2021-11-06 LAB — CBC
HCT: 28 % — ABNORMAL LOW (ref 36.0–46.0)
Hemoglobin: 8.5 g/dL — ABNORMAL LOW (ref 12.0–15.0)
MCH: 31.8 pg (ref 26.0–34.0)
MCHC: 30.4 g/dL (ref 30.0–36.0)
MCV: 104.9 fL — ABNORMAL HIGH (ref 80.0–100.0)
Platelets: 179 10*3/uL (ref 150–400)
RBC: 2.67 MIL/uL — ABNORMAL LOW (ref 3.87–5.11)
RDW: 15.7 % — ABNORMAL HIGH (ref 11.5–15.5)
WBC: 9.6 10*3/uL (ref 4.0–10.5)
nRBC: 0 % (ref 0.0–0.2)

## 2021-11-06 LAB — COMPREHENSIVE METABOLIC PANEL
ALT: 53 U/L — ABNORMAL HIGH (ref 0–44)
AST: 101 U/L — ABNORMAL HIGH (ref 15–41)
Albumin: 2.7 g/dL — ABNORMAL LOW (ref 3.5–5.0)
Alkaline Phosphatase: 179 U/L — ABNORMAL HIGH (ref 38–126)
Anion gap: 15 (ref 5–15)
BUN: 49 mg/dL — ABNORMAL HIGH (ref 6–20)
CO2: 11 mmol/L — ABNORMAL LOW (ref 22–32)
Calcium: 7.6 mg/dL — ABNORMAL LOW (ref 8.9–10.3)
Chloride: 98 mmol/L (ref 98–111)
Creatinine, Ser: 2.38 mg/dL — ABNORMAL HIGH (ref 0.44–1.00)
GFR, Estimated: 24 mL/min — ABNORMAL LOW (ref >60)
Glucose, Bld: 74 mg/dL (ref 70–99)
Potassium: 3.2 mmol/L — ABNORMAL LOW (ref 3.5–5.1)
Sodium: 124 mmol/L — ABNORMAL LOW (ref 135–145)
Total Bilirubin: 0.5 mg/dL (ref 0.3–1.2)
Total Protein: 6.2 g/dL — ABNORMAL LOW (ref 6.5–8.1)

## 2021-11-06 LAB — CBG MONITORING, ED: Glucose-Capillary: 70 mg/dL (ref 70–99)

## 2021-11-06 LAB — LIPASE, BLOOD: Lipase: 28 U/L (ref 11–51)

## 2021-11-06 LAB — BRAIN NATRIURETIC PEPTIDE: B Natriuretic Peptide: 2049.5 pg/mL — ABNORMAL HIGH (ref 0.0–100.0)

## 2021-11-06 LAB — LACTIC ACID, PLASMA
Lactic Acid, Venous: 3.2 mmol/L (ref 0.5–1.9)
Lactic Acid, Venous: 2.9 mmol/L (ref 0.5–1.9)
Lactic Acid, Venous: 1.8 mmol/L (ref 0.5–1.9)
Lactic Acid, Venous: 2.2 mmol/L (ref 0.5–1.9)

## 2021-11-06 LAB — PROCALCITONIN: Procalcitonin: >150 ng/mL

## 2021-11-06 LAB — MAGNESIUM: Magnesium: 1.4 mg/dL — ABNORMAL LOW (ref 1.7–2.4)

## 2021-11-06 LAB — SEDIMENTATION RATE: Sed Rate: 68 mm/hr — ABNORMAL HIGH (ref 0–30)

## 2021-11-06 LAB — TROPONIN I (HIGH SENSITIVITY)
Troponin I (High Sensitivity): 109 ng/L (ref <18)
Troponin I (High Sensitivity): 141 ng/L (ref <18)

## 2021-11-06 LAB — D-DIMER, QUANTITATIVE: D-Dimer, Quant: 7.62 ug/mL-FEU — ABNORMAL HIGH (ref 0.00–0.50)

## 2021-11-06 LAB — MRSA NEXT GEN BY PCR, NASAL: MRSA by PCR Next Gen: NOT DETECTED

## 2021-11-06 MED ORDER — MIDAZOLAM-SODIUM CHLORIDE 100-0.9 MG/100ML-% IV SOLN
2.0000 mg/h | INTRAVENOUS | Status: DC
Start: 2021-11-06 — End: 2021-11-07
  Administered 2021-11-06: 2 mg/h via INTRAVENOUS
  Filled 2021-11-06: qty 100

## 2021-11-06 MED ORDER — SODIUM CHLORIDE 0.9 % IV BOLUS (SEPSIS)
1000.0000 mL | Freq: Once | INTRAVENOUS | Status: AC
Start: 2021-11-06 — End: 2021-11-06
  Administered 2021-11-06: 1000 mL via INTRAVENOUS

## 2021-11-06 MED ORDER — PANTOPRAZOLE 2 MG/ML SUSPENSION
40.0000 mg | Freq: Every day | ORAL | Status: DC
  Filled 2021-11-06 (×2): qty 20

## 2021-11-06 MED ORDER — ACETAMINOPHEN 325 MG RE SUPP
650.0000 mg | Freq: Once | RECTAL | Status: AC
  Administered 2021-11-06: 650 mg via RECTAL
  Filled 2021-11-06: qty 2

## 2021-11-06 MED ORDER — CEFEPIME HCL 2 G IV SOLR
2.0000 g | Freq: Once | INTRAVENOUS | Status: AC
  Administered 2021-11-06: 2 g via INTRAVENOUS
  Filled 2021-11-06: qty 12.5

## 2021-11-06 MED ORDER — LACTATED RINGERS IV SOLN
INTRAVENOUS | Status: DC
Start: 2021-11-06 — End: 2021-11-07

## 2021-11-06 MED ORDER — DOCUSATE SODIUM 50 MG/5ML PO LIQD
100.0000 mg | Freq: Two times a day (BID) | ORAL | Status: DC
  Filled 2021-11-06 (×2): qty 10

## 2021-11-06 MED ORDER — ROCURONIUM BROMIDE 10 MG/ML (PF) SYRINGE
PREFILLED_SYRINGE | INTRAVENOUS | Status: AC
  Administered 2021-11-06: 100 mg via INTRAVENOUS
  Filled 2021-11-06: qty 10

## 2021-11-06 MED ORDER — ALBUTEROL SULFATE (2.5 MG/3ML) 0.083% IN NEBU
3.0000 mL | INHALATION_SOLUTION | RESPIRATORY_TRACT | Status: DC | PRN
  Administered 2021-11-06: 3 mL via RESPIRATORY_TRACT
  Filled 2021-11-06: qty 3

## 2021-11-06 MED ORDER — ORAL CARE MOUTH RINSE: 15.0000 mL | OROMUCOSAL | Status: DC

## 2021-11-06 MED ORDER — NOREPINEPHRINE 4 MG/250ML-% IV SOLN
INTRAVENOUS | Status: AC
  Filled 2021-11-06: qty 250

## 2021-11-06 MED ORDER — VANCOMYCIN HCL IN DEXTROSE 1-5 GM/200ML-% IV SOLN
1000.0000 mg | Freq: Once | INTRAVENOUS | Status: DC
  Filled 2021-11-06 (×2): qty 200

## 2021-11-06 MED ORDER — IPRATROPIUM-ALBUTEROL 0.5-2.5 (3) MG/3ML IN SOLN
3.0000 mL | Freq: Once | RESPIRATORY_TRACT | Status: AC
  Administered 2021-11-06: 3 mL via RESPIRATORY_TRACT
  Filled 2021-11-06: qty 3

## 2021-11-06 MED ORDER — ORAL CARE MOUTH RINSE: 15.0000 mL | OROMUCOSAL | Status: DC | PRN

## 2021-11-06 MED ORDER — NOREPINEPHRINE 4 MG/250ML-% IV SOLN
INTRAVENOUS | Status: AC
  Administered 2021-11-06: 4 mg
  Administered 2021-11-06: 40 ug/min via INTRAVENOUS
  Filled 2021-11-06: qty 250

## 2021-11-06 MED ORDER — POLYETHYLENE GLYCOL 3350 17 G PO PACK
17.0000 g | PACK | Freq: Every day | ORAL | Status: DC
  Filled 2021-11-06: qty 1

## 2021-11-06 MED ORDER — POTASSIUM CHLORIDE 10 MEQ/100ML IV SOLN
10.0000 meq | INTRAVENOUS | Status: DC
  Administered 2021-11-06 (×2): 10 meq via INTRAVENOUS
  Filled 2021-11-06 (×4): qty 100

## 2021-11-06 MED ORDER — FENTANYL 2500MCG IN NS 250ML (10MCG/ML) PREMIX INFUSION: 50.0000 ug/h | INTRAVENOUS | Status: DC

## 2021-11-06 MED ORDER — SODIUM CHLORIDE 0.9 % IV SOLN: Freq: Once | INTRAVENOUS | Status: DC

## 2021-11-06 MED ORDER — FAMOTIDINE 20 MG PO TABS
20.0000 mg | ORAL_TABLET | Freq: Two times a day (BID) | ORAL | Status: DC
Start: 2021-11-06 — End: 2021-11-07
  Filled 2021-11-06: qty 1

## 2021-11-06 MED ORDER — FENTANYL CITRATE PF 50 MCG/ML IJ SOSY: 50.0000 ug | PREFILLED_SYRINGE | Freq: Once | INTRAMUSCULAR | Status: DC

## 2021-11-06 MED ORDER — MAGNESIUM SULFATE 4 GM/100ML IV SOLN
4.0000 g | Freq: Once | INTRAVENOUS | Status: AC
  Administered 2021-11-06: 4 g via INTRAVENOUS
  Filled 2021-11-06: qty 100

## 2021-11-06 MED ORDER — SODIUM BICARBONATE 8.4 % IV SOLN
INTRAVENOUS | Status: DC
  Filled 2021-11-06: qty 1000
  Filled 2021-11-06: qty 150

## 2021-11-06 MED ORDER — VASOPRESSIN 20 UNITS/100 ML INFUSION FOR SHOCK
0.0000 [IU]/min | INTRAVENOUS | Status: DC
  Filled 2021-11-06: qty 100

## 2021-11-06 MED ORDER — HEPARIN SODIUM (PORCINE) 5000 UNIT/ML IJ SOLN
5000.0000 [IU] | Freq: Three times a day (TID) | INTRAMUSCULAR | Status: DC
  Filled 2021-11-06: qty 1

## 2021-11-06 MED ORDER — SODIUM CHLORIDE 0.9 % IV BOLUS
1000.0000 mL | Freq: Once | INTRAVENOUS | Status: AC
  Administered 2021-11-06: 1000 mL via INTRAVENOUS

## 2021-11-06 MED ORDER — METHYLPREDNISOLONE SODIUM SUCC 40 MG IJ SOLR: 40.0000 mg | Freq: Two times a day (BID) | INTRAMUSCULAR | Status: DC

## 2021-11-06 MED ORDER — METRONIDAZOLE 500 MG/100ML IV SOLN
500.0000 mg | Freq: Once | INTRAVENOUS | Status: AC
  Administered 2021-11-06: 500 mg via INTRAVENOUS
  Filled 2021-11-06 (×2): qty 100

## 2021-11-06 MED ORDER — SODIUM CHLORIDE 0.9% FLUSH: 3.0000 mL | Freq: Two times a day (BID) | INTRAVENOUS | Status: DC

## 2021-11-06 MED ORDER — FENTANYL BOLUS VIA INFUSION: 50.0000 ug | INTRAVENOUS | Status: DC | PRN

## 2021-11-06 MED ORDER — NOREPINEPHRINE 4 MG/250ML-% IV SOLN: 0.0000 ug/min | INTRAVENOUS | Status: DC

## 2021-11-06 MED ORDER — VANCOMYCIN HCL IN DEXTROSE 1-5 GM/200ML-% IV SOLN: 1000.0000 mg | INTRAVENOUS | Status: DC

## 2021-11-06 MED ORDER — MIDAZOLAM HCL (PF) 5 MG/ML IJ SOLN
INTRAMUSCULAR | Status: AC
  Administered 2021-11-06: 4 mg via INTRAVENOUS
  Filled 2021-11-06: qty 1

## 2021-11-06 MED ORDER — NOREPINEPHRINE 4 MG/250ML-% IV SOLN
2.0000 ug/min | INTRAVENOUS | Status: DC
  Administered 2021-11-06: 50 ug/min via INTRAVENOUS
  Filled 2021-11-06: qty 250

## 2021-11-06 MED ORDER — SODIUM CHLORIDE 0.9 % IV BOLUS (SEPSIS)
1000.0000 mL | Freq: Once | INTRAVENOUS | Status: AC
  Administered 2021-11-06: 1000 mL via INTRAVENOUS

## 2021-11-06 MED ORDER — SODIUM CHLORIDE 0.9% FLUSH: 3.0000 mL | INTRAVENOUS | Status: DC | PRN

## 2021-11-06 MED ORDER — NOREPINEPHRINE 4 MG/250ML-% IV SOLN
INTRAVENOUS | Status: AC
  Administered 2021-11-06: 5 ug/min
  Filled 2021-11-06: qty 250

## 2021-11-06 MED ORDER — SODIUM CHLORIDE 0.9 % IV SOLN
1.0000 g | Freq: Two times a day (BID) | INTRAVENOUS | Status: DC
  Filled 2021-11-06: qty 20

## 2021-11-06 MED ORDER — SODIUM CHLORIDE 0.9 % IV SOLN
250.0000 mL | INTRAVENOUS | Status: DC | PRN
  Administered 2021-11-06: 250 mL via INTRAVENOUS

## 2021-11-06 MED ORDER — SODIUM CHLORIDE 0.9 % IV SOLN
250.0000 mL | INTRAVENOUS | Status: DC
  Administered 2021-11-06: 250 mL via INTRAVENOUS

## 2021-11-06 MED ORDER — NOREPINEPHRINE 16 MG/250ML-% IV SOLN
0.0000 ug/min | INTRAVENOUS | Status: DC
  Filled 2021-11-06: qty 250

## 2021-11-07 LAB — BLOOD CULTURE ID PANEL (REFLEXED) - BCID2

## 2021-11-09 LAB — CULTURE, BLOOD (ROUTINE X 2): Special Requests: ADEQUATE

## 2021-11-21 NOTE — Progress Notes (Signed)
Pharmacy Antibiotic Note  Katherine Perez is a 51 y.o. female w/ PMH of chronic kidney disease stage IV, multifactorial anemia (iron deficiency and anemia of chronic kidney disease), chronic interstitial cystitis, chronic constipation with history of chronic laxative abuse, Raynaud's phenomenon and history of multidrug-resistant urinary tract infections admitted on 11-12-21 with facial swelling, developed severe resp failure .  Pharmacy has been consulted for vancomycin and meropenem dosing.  Plan:  1) start vancomycin 1000 mg IV x 1 then 1000 mg IV Q 48 hrs Goal AUC 400-550. Expected AUC: 469.8 SCr used: 2.38 mg/dL  2) start meropenem 1 gram IV every 12 hours   Height: 5\' 9"  (175.3 cm) Weight: 57.5 kg (126 lb 12.2 oz) IBW/kg (Calculated) : 66.2  Temp (24hrs), Avg:100.5 F (38.1 C), Min:98 F (36.7 C), Max:102.2 F (39 C)  Recent Labs  Lab 11/12/21 1027 November 12, 2021 1210 November 12, 2021 1445 Nov 12, 2021 1731  WBC 6.8  --   --  9.6  CREATININE 2.38*  --   --   --   LATICACIDVEN 3.2* 2.9* 1.8  --     Estimated Creatinine Clearance: 25.7 mL/min (A) (by C-G formula based on SCr of 2.38 mg/dL (H)).    Allergies  Allergen Reactions   Cetirizine Hives   Diazepam Other (See Comments)    Depression    Baclofen Anxiety and Other (See Comments)    AMS - "Really messed me up, caused me to drop things"     Antimicrobials this admission: 07/17 metronidazole x 1 07/17 cefepime x 1 07/17 vancomycin >>  07/17 meropenem >>   Microbiology results: 07/17 BCx: pending 07/17 UCx: pending  07/17 MRSA PCR: negative  Thank you for allowing pharmacy to be a part of this patient's care.  Dallie Piles 12-Nov-2021 6:23 PM

## 2021-11-21 NOTE — ED Notes (Signed)
Informed RN bed assigned 

## 2021-11-21 NOTE — Progress Notes (Signed)
PHARMACY -  BRIEF ANTIBIOTIC NOTE   Pharmacy has received consult(s) for Cefepime and Vancomycin from an ED provider.  The patient's profile has been reviewed for ht/wt/allergies/indication/available labs.    One time order(s) placed for Cefepime 2g and Vancomycin 1g by ED provider  Further antibiotics/pharmacy consults should be ordered by admitting physician if indicated.                       Thank you, Vira Blanco 11/24/21  12:19 PM

## 2021-11-21 NOTE — Procedures (Signed)
Arterial Line Placement: Indication: Frequent blood draws; Invasive BP monitoring.   Consent: Written  Hand washing performed prior to starting the procedure.   Procedure: An active timeout was performed and correct patient, name, & ID confirmed. Physicial exam was performed to ensure adequate perfusion.  Using sterile technique, an aterial line was inserted into the RT  Femoral artery.  Catheter threaded and the needle was removed with appropriate blood return.  Arterial waveform was noted.  After the procedure, the patient's extremities were observed to be pink and warm.   Estimated Blood Loss: None .   Number of Attempts: 2.   Complications: None .  Operator: Pragya Lofaso.   Corrin Parker, M.D.  [facility]  Medical Director [place] Director [facility] Cardio-Pulmonary Department

## 2021-11-21 NOTE — Progress Notes (Signed)
CODE SEPSIS - PHARMACY COMMUNICATION  **Broad Spectrum Antibiotics should be administered within 1 hour of Sepsis diagnosis**  Time Code Sepsis Called/Page Received: 12:03  Antibiotics Ordered: Cefepime, Vancomycin, and Metronidazole  Time of 1st antibiotic administration: Cefepime given at 12:43  Additional action taken by pharmacy: n/a  If necessary, Name of Provider/Nurse Contacted: n/a    Vira Blanco ,PharmD Clinical Pharmacist  November 26, 2021  12:48 PM

## 2021-11-21 NOTE — ED Notes (Signed)
Report given to [facility] in ICU

## 2021-11-21 NOTE — Procedures (Signed)
Central Venous Catheter Placement:TRIPLE LUMEN   Procedure: Insertion of Non-tunneled Central Venous Catheter(36556) with US guidance (20355)   Indication(s) Medication administration and Difficult access  CVP monitoring Patient receiving vesicant or irritant drug.; Patient receiving intravenous therapy for longer than 5 days.; Patient has limited or no vascular access.    Consent Risks of the procedure as well as the alternatives and risks of each were explained to the patient and/or caregiver.  Consent for the procedure was obtained and is signed in the bedside chart   Timeout Verified patient identification, verified procedure, site/side was marked, verified correct patient position, special equipment/implants available, medications/allergies/relevant history reviewed, required imaging and test results available. Patient comfort was obtained.     Sterile Technique Maximal sterile technique including full sterile barrier drape, hand hygiene, sterile gown, sterile gloves, mask, hair covering, sterile ultrasound probe cover (if used).   Hand washing performed prior to starting the procedure.    Procedure Description Area of catheter insertion was cleaned with chlorhexidine and draped in sterile fashion.  With real-time ultrasound guidance a central venous catheter was placed into the right internal jugular vein. Nonpulsatile blood flow and easy flushing noted in all ports.  The catheter was sutured in place and sterile dressing applied.   A triple lumen catheter was placed in RT  Internal Jugular Vein There was good blood return, catheter caps were placed on lumens, catheter flushed easily, the line was secured and a sterile dressing and BIO-PATCH applied.    Complications/Tolerance None; patient tolerated the procedure well. Chest X-ray is ordered to verify placement     EBL Minimal   Specimen(s) None   Number of Attempts: 1 Complications:none Estimated Blood Loss:  none Chest Radiograph indicated and ordered.  Operator: Saxon Barich.   Corrin Parker, M.D.  [facility]  Medical Director [place] Director [facility] Cardio-Pulmonary Department

## 2021-11-21 NOTE — ED Notes (Addendum)
Before starting fluids pt became very anxious. Unable to get a O2 Malinda MD called to bedside and RT

## 2021-11-21 NOTE — ED Notes (Signed)
7 22 at lip with color change

## 2021-11-21 NOTE — IPAL (Signed)
  Interdisciplinary Goals of Care Family Meeting   Date carried out: 11/09/21  Location of the meeting: Bedside  Member's involved: Physician, Bedside Registered Nurse, and Family Member or next of kin      GOALS OF CARE DISCUSSION  The Clinical status was relayed to family in detail-  Updated and notified of patients medical condition- Patient remains unresponsive and will not open eyes to command.   Patient is having a weak cough and struggling to remove secretions.   Patient with increased WOB and using accessory muscles to breathe Explained to family course of therapy and the modalities   Patient with Progressive multiorgan failure with a very high probablity of a very minimal chance of meaningful recovery despite all aggressive and optimal medical therapy.    Husband  understands the situation.  He has consented and agreed to DNR status  Family are satisfied with Plan of action and management. All questions answered  Additional CC time 25 mins   Neyra Pettie Patricia Pesa, M.D.  [facility]  Medical Director [place] Director [facility] Cardio-Pulmonary Department

## 2021-11-21 NOTE — Consult Note (Signed)
PHARMACY CONSULT NOTE - FOLLOW UP  Pharmacy Consult for Electrolyte Monitoring and Replacement   Recent Labs: Potassium (mmol/L)  Date Value  11-10-21 3.2 (L)   Magnesium (mg/dL)  Date Value  2021/11/10 1.4 (L)   Calcium (mg/dL)  Date Value  Nov 10, 2021 7.6 (L)   Albumin (g/dL)  Date Value  2021-11-10 2.7 (L)   Phosphorus (mg/dL)  Date Value  09/15/2021 3.1   Sodium (mmol/L)  Date Value  2021/11/10 124 (L)     Assessment: 51 yo WF with multiple medical issues with cough and congestion, h/o ASTHMA Came to ER with facial swelling, developed severe resp failure and needing emergent intubation with elevated BNP possible acute sCHF. Pharmacy consulted for mgmt of electrolytes in CCU.  Goal of Therapy:  Lytes WNL  MIVF: NaHCO3 in D5W @125ml /hr  Plan:  Scr 2.38 (BL unclear) K 3.2>__: Replete with KCL 10 mEq q1h x4 doses. Mg 1.4 > __: Replete with MgSO4 4g IV x1 dose. Corrected Calcium is 8.6 (alb 2.7). no repletion at this time. Next levels with AM labs  Lorna Dibble ,PharmD Clinical Pharmacist 11/10/2021 3:53 PM

## 2021-11-21 NOTE — Sepsis Progress Note (Signed)
Sepsis protocol monitored by eLink 

## 2021-11-21 NOTE — ED Triage Notes (Signed)
Pt comes with c/o increased SOB for few days. Pt states last night she noticed left sided facial swelling and swelling in her throat.  Pt states she is able to swallow ok.  Pt does have labored breathing and appears in distress.  Current O2 at 70% RA.  Pt placed on O2 at this time. 2L and only to 82%. Increased to 3L 84%. Pt taken to room at this time.

## 2021-11-21 NOTE — Death Summary Note (Signed)
DEATH SUMMARY   Patient Details  Name: Katherine Perez MRN: [id number] DOB: 18-Dec-1970  Admission/Discharge Information   Admit Date:  November 21, 2021  Date of Death: Date of Death: 11/21/21  Time of Death: Time of Death: 92  Length of Stay: 0  Referring Physician: Gladstone Lighter, MD   Reason(s) for Hospitalization  RESP FAILURE SEPTIC SHOCK  Diagnoses  Preliminary cause of death: ISCHEMIC CARDIOMYOPATHY,SCHF, SEPTIC SHOCK  Secondary Diagnoses (including complications and co-morbidities):  Principal Problem:   Respiratory failure ([place]) Active Problems:   Septic shock [facility])   Brief Hospital Course (including significant findings, care, treatment, and services provided and events leading to death)   51 yo WF with multiple medical issues with cough and congestion, h/o ASTHMA Came to ER with facial swelling, developed severe resp failure and needing emergent intubation with elevated BNP possible acute sCHF     History of Present Illness:  51 y.o. female who had been seen at urgent care over a week ago for some congestion.   She got some antibiotics but really did not get much better and then yesterday or so began to feel more congested and with more of a cough additionally she developed swelling around her right eye and under her chin.     + has become increasingly short of breath.  +abdominal pain to which is perhaps some what worse than her usual pain that she has with her abdominal/colon problems.     She uses laxatives chronically per husband reports.  Her husband also reports that she had a jaw infection some years ago which presented with swelling something like she is having now.     Pt comes with c/o increased SOB for few days. Pt states last night she noticed left sided facial swelling and swelling in her throat.  Pt states she is able to swallow ok.   Pt does have labored breathing and appears in distress.  Current O2 at 70% RA.   Pt placed on O2 at  this time. 2L and only to 82%. Increased to 3L 84%. Pt taken to room at this time.   Patient developed worsening resp failure which led to emergent intubation Patient with severe acidosis and severe cyanosis 51 yo WF with severe multiorgan failure likely due to septic shock with respiratory, renal, metabolic encephalopathy and severe shock   Severe ACUTE Hypoxic and Hypercapnic Respiratory Failure -continue Mechanical Ventilator support -continue Bronchodilator Therapy -Wean Fio2 and PEEP as tolerated -VAP/VENT bundle implementation       SEVERE COPD EXACERBATION - IV steroids as prescribed -continue NEB THERAPY as prescribed -morphine as needed -wean fio2 as needed and tolerated     CARDIAC FAILURE-acute  systolic dysfunction -oxygen as needed -Lasix as tolerated -follow up cardiac enzymes as indicated     CARDIAC ICU monitoring     ACUTE KIDNEY INJURY/Renal Failure SEVERE ACIDOSIS  -continue Foley Catheter-assess need -Avoid nephrotoxic agents -Follow urine output, BMP -Ensure adequate renal perfusion, optimize oxygenation -Renal dose medications STRART BICARB INFUSION   No intake or output data in the 24 hours ending 11-21-21 1330     NEUROLOGY Acute metabolic encephalopathy, need for sedation Goal RASS -2 to -3 Severe acidosis     SEPTIC SHOCK SOURCE-?UTI Mandibular infection? -use vasopressors to keep MAP>65 as needed -follow ABG and LA -follow up cultures -emperic ABX -consider stress dose steroids -aggressive IV fluid resuscitation   INFECTIOUS DISEASE -continue antibiotics as prescribed -follow up cultures     ENDO - ICU hypoglycemic\Hyperglycemia  protocol -check FSBS per protocol     GI GI PROPHYLAXIS as indicated   NUTRITIONAL STATUS DIET-NPO Constipation protocol as indicated     ELECTROLYTES -follow labs as needed -replace as needed -pharmacy consultation and following     ACUTE ANEMIA- TRANSFUSE AS NEEDED CONSIDER  TRANSFUSION  IF HGB<7 DVT PRX with TED/SCD's ONLY         Best practice (right click and "Reselect all SmartList Selections" daily)  Diet: NPO Pain/Anxiety/Delirium protocol (if indicated): Yes (RASS goal -1) VAP protocol (if indicated): Yes DVT prophylaxis: Subcutaneous Heparin GI prophylaxis: H2B   Central venous access:  N/A Arterial line:  N/A Foley:  Yes, and it is still needed Mobility:  bed rest  Code Status:  FULL Disposition:ICU     GOALS OF CARE DISCUSSION   The Clinical status was relayed to family in detail-   Updated and notified of patients medical condition- Patient remains unresponsive and will not open eyes to command.   Patient is having a weak cough and struggling to remove secretions.   Patient with increased WOB and using accessory muscles to breathe Explained to family course of therapy and the modalities    Patient with Progressive multiorgan failure with a very high probablity of a very minimal chance of meaningful recovery despite all aggressive and optimal medical therapy.      Husband  understands the situation.   He has consented and agreed to DNR status   Family are satisfied with Plan of action and management. All questions answered PATIENT SUBSEQUENTLY DIED WHILE FAMILY AT BEDSIDE   Pertinent Labs and Studies  Significant Diagnostic Studies ECHOCARDIOGRAM COMPLETE  Result Date: 11-09-2021    ECHOCARDIOGRAM REPORT   Patient Name:   Katherine Perez [facility] Date of Exam: November 09, 2021 Medical Rec #:  [id number]                     Height:       69.0 in Accession #:    [id number]                    Weight:       126.8 lb Date of Birth:  15-Jun-1970                     BSA:          1.702 m Patient Age:    41 years                      BP:           113/80 mmHg Patient Gender: F                             HR:           106 bpm. Exam Location:  [facility] Procedure: 2D Echo, Color Doppler and Cardiac Doppler Indications:     R06.03 Acute respiratory  distress  History:         Patient has no prior history of Echocardiogram examinations.                  Asthma; Risk Factors:Current Smoker.  Sonographer:     Rosalia Hammers Referring Phys:  [id number] Flora Lipps Diagnosing Phys: Kathlyn Sacramento MD  Sonographer Comments: Suboptimal parasternal window and echo performed with patient supine and on artificial respirator. IMPRESSIONS  1. Left ventricular ejection fraction, by estimation,  is 40 to 45%. The left ventricle has mildly decreased function. The left ventricle demonstrates regional wall motion abnormalities (see scoring diagram/findings for description). There is mild left ventricular hypertrophy. Left ventricular diastolic parameters are consistent with Grade II diastolic dysfunction (pseudonormalization). There is moderate hypokinesis of the left ventricular, entire inferior wall.  2. Right ventricular systolic function is normal. The right ventricular size is normal. There is moderately elevated pulmonary artery systolic pressure.  3. Left atrial size was mildly dilated.  4. The mitral valve is abnormal. Severe mitral valve regurgitation. No evidence of mitral stenosis. can not exclude reptured cordae tedineae as there is complete malcoaptation of mitral valve leaflets.  5. The aortic valve was not well visualized. Aortic valve regurgitation is mild to moderate. Aortic valve sclerosis/calcification is present, without any evidence of aortic stenosis.  6. The inferior vena cava is dilated in size with <50% respiratory variability, suggesting right atrial pressure of 15 mmHg. FINDINGS  Left Ventricle: Left ventricular ejection fraction, by estimation, is 40 to 45%. The left ventricle has mildly decreased function. The left ventricle demonstrates regional wall motion abnormalities. Moderate hypokinesis of the left ventricular, entire inferior wall. The left ventricular internal cavity size was normal in size. There is mild left ventricular hypertrophy. Left  ventricular diastolic parameters are consistent with Grade II diastolic dysfunction (pseudonormalization). Right Ventricle: The right ventricular size is normal. No increase in right ventricular wall thickness. Right ventricular systolic function is normal. There is moderately elevated pulmonary artery systolic pressure. The tricuspid regurgitant velocity is 2.79 m/s, and with an assumed right atrial pressure of 15 mmHg, the estimated right ventricular systolic pressure is 82.4 mmHg. Left Atrium: Left atrial size was mildly dilated. Right Atrium: Right atrial size was normal in size. Pericardium: There is no evidence of pericardial effusion. Mitral Valve: The mitral valve is abnormal. Severe mitral valve regurgitation. No evidence of mitral valve stenosis. Tricuspid Valve: The tricuspid valve is normal in structure. Tricuspid valve regurgitation is mild . No evidence of tricuspid stenosis. Aortic Valve: The aortic valve was not well visualized. Aortic valve regurgitation is mild to moderate. Aortic regurgitation PHT measures 391 msec. Aortic valve sclerosis/calcification is present, without any evidence of aortic stenosis. Aortic valve mean gradient measures 15.0 mmHg. Aortic valve peak gradient measures 19.9 mmHg. Aortic valve area, by VTI measures 0.80 cm. Pulmonic Valve: The pulmonic valve was normal in structure. Pulmonic valve regurgitation is trivial. No evidence of pulmonic stenosis. Aorta: The aortic root is normal in size and structure. Venous: The inferior vena cava is dilated in size with less than 50% respiratory variability, suggesting right atrial pressure of 15 mmHg. IAS/Shunts: No atrial level shunt detected by color flow Doppler.  LEFT VENTRICLE PLAX 2D LVIDd:         4.53 cm   Diastology LVIDs:         3.64 cm   LV e' medial:    6.40 cm/s LV PW:         1.15 cm   LV E/e' medial:  28.4 LV IVS:        1.12 cm   LV e' lateral:   9.14 cm/s LVOT diam:     1.90 cm   LV E/e' lateral: 19.9 LV SV:          33 LV SV Index:   19 LVOT Area:     2.84 cm  RIGHT VENTRICLE RV Basal diam:  2.45 cm RV S prime:     6.48 cm/s TAPSE (  M-mode): 1.1 cm LEFT ATRIUM             Index        RIGHT ATRIUM          Index LA diam:        4.10 cm 2.41 cm/m   RA Area:     7.59 cm LA Vol (A2C):   57.6 ml 33.84 ml/m  RA Volume:   11.90 ml 6.99 ml/m LA Vol (A4C):   52.2 ml 30.67 ml/m LA Biplane Vol: 58.2 ml 34.19 ml/m  AORTIC VALVE AV Area (Vmax):    0.94 cm AV Area (Vmean):   0.91 cm AV Area (VTI):     0.80 cm AV Vmax:           223.00 cm/s AV Vmean:          184.000 cm/s AV VTI:            0.411 m AV Peak Grad:      19.9 mmHg AV Mean Grad:      15.0 mmHg LVOT Vmax:         73.70 cm/s LVOT Vmean:        59.200 cm/s LVOT VTI:          0.116 m LVOT/AV VTI ratio: 0.28 AI PHT:            391 msec MITRAL VALVE                TRICUSPID VALVE MV Area (PHT): 4.36 cm     TR Peak grad:   31.1 mmHg MV Decel Time: 174 msec     TR Vmax:        279.00 cm/s MV E velocity: 182.00 cm/s MV A velocity: 103.00 cm/s  SHUNTS MV E/A ratio:  1.77         Systemic VTI:  0.12 m                             Systemic Diam: 1.90 cm Kathlyn Sacramento MD Electronically signed by Kathlyn Sacramento MD Signature Date/Time: 12/06/2021/6:23:03 PM    Final    DG Chest Port 1 View  Result Date: 06-Dec-2021 CLINICAL DATA:  Central line placement. EXAM: PORTABLE CHEST 1 VIEW COMPARISON:  12-06-21. FINDINGS: Right IJ central venous catheter with tip projecting at the superior cavoatrial junction. Gastric tube courses below the diaphragm with the tip outside the field of view. Endotracheal tube tip is approximately 6.8 cm above the carina. Left lower lobe opacities better seen on same day CT chest. No visible pneumothorax. No pleural effusions. IMPRESSION: Right IJ central venous catheter with tip projecting at the superior cavoatrial junction. No visible pneumothorax. Electronically Signed   By: Margaretha Sheffield M.D.   On: 12/06/21 16:13   CT MAXILLOFACIAL WO  CONTRAST  Result Date: Dec 06, 2021 CLINICAL DATA:  Facial trauma, blunt EXAM: CT HEAD WITHOUT CONTRAST CT MAXILLOFACIAL WITHOUT CONTRAST CT NECK WITHOUT CONTRAST TECHNIQUE: Multidetector CT imaging of the head, cervical spine, and maxillofacial structures were performed using the standard protocol without intravenous contrast. Multiplanar CT image reconstructions of the cervical spine and maxillofacial structures were also generated. Multidetector CT imaging of the neck was performed using the standard protocol without contrast. RADIATION DOSE REDUCTION: This exam was performed according to the departmental dose-optimization program which includes automated exposure control, adjustment of the mA and/or kV according to patient size and/or use of iterative reconstruction technique. COMPARISON:  None  Available. FINDINGS: CT HEAD FINDINGS Brain: No evidence of acute infarction, hemorrhage, hydrocephalus, extra-axial collection or mass lesion/mass effect. Vascular: No hyperdense vessel identified. Skull: No acute fracture. Other: No mastoid effusions. CT MAXILLOFACIAL FINDINGS Osseous: No fracture or mandibular dislocation. No destructive process. Orbits: Right periorbital preseptal edema. Globes, retrobulbar fat, extraocular muscles and optic nerves appear unremarkable. Sinuses: Moderate paranasal sinus mucosal thickening. Soft tissues: Right periorbital and right face edema CT CERVICAL SPINE FINDINGS Pharynx and larynx: Edematous appearance of the oropharynx without visible mass or discrete, drainable fluid collection. Small volume of retropharyngeal edema. Salivary glands: Edema surrounding the submandibular glands and parotid glands bilaterally. Otherwise, unremarkable. Thyroid: Unremarkable. Lymph nodes: No visible enlarged lymph nodes; however, the edema limits assessment. Skeleton: Approximately 7 mm of anterolisthesis of C3 on C4, probably degenerative given similar findings on the prior CT head (partially  imaged) and severe facet arthropathy at this level. Upper chest: Mild interlobular septal thickening in the visualized lung apices, suggestive of interstitial edema. Other: Extensive trans spatial edema throughout the neck, , including small volume prevertebral fluid and subcutaneous edema involving all spaces of the neck. No visible discrete, drainable fluid collection on this noncontrast study. IMPRESSION: CT head: No evidence of acute intracranial abnormality. CT maxillofacial: 1. Right periorbital and right face edema, which could be related to contusion (given reported trauma), infection/cellulitis, or possibly angioedema given findings in the neck (see below). No discrete, drainable fluid collection. 2. No evidence of acute fracture. 3. Mild sclerosis of the right mandible, nonspecific but osteomyelitis is not excluded given the history of jaw infection. 4. Moderate paranasal sinus mucosal thickening. CT neck: 1. Extensive/diffuse edema throughout the neck without visible discrete, drainable fluid collection on this noncontrast study. Findings could be secondary to infection, angioedema, and/or anasarca. 2. Mild interlobular septal thickening in the visualized lung apices, suggestive of interstitial edema. 3. Approximately 7 mm of anterolisthesis of C3 on C4, probably degenerative given similar findings on the prior CT head (partially imaged) and severe facet arthropathy at this level. An MRI could better assess for canal or foraminal stenosis clinically warranted. Electronically Signed   By: Margaretha Sheffield M.D.   On: Nov 09, 2021 14:09   CT Soft Tissue Neck Wo Contrast  Result Date: Nov 09, 2021 CLINICAL DATA:  Facial trauma, blunt EXAM: CT HEAD WITHOUT CONTRAST CT MAXILLOFACIAL WITHOUT CONTRAST CT NECK WITHOUT CONTRAST TECHNIQUE: Multidetector CT imaging of the head, cervical spine, and maxillofacial structures were performed using the standard protocol without intravenous contrast. Multiplanar CT image  reconstructions of the cervical spine and maxillofacial structures were also generated. Multidetector CT imaging of the neck was performed using the standard protocol without contrast. RADIATION DOSE REDUCTION: This exam was performed according to the departmental dose-optimization program which includes automated exposure control, adjustment of the mA and/or kV according to patient size and/or use of iterative reconstruction technique. COMPARISON:  None Available. FINDINGS: CT HEAD FINDINGS Brain: No evidence of acute infarction, hemorrhage, hydrocephalus, extra-axial collection or mass lesion/mass effect. Vascular: No hyperdense vessel identified. Skull: No acute fracture. Other: No mastoid effusions. CT MAXILLOFACIAL FINDINGS Osseous: No fracture or mandibular dislocation. No destructive process. Orbits: Right periorbital preseptal edema. Globes, retrobulbar fat, extraocular muscles and optic nerves appear unremarkable. Sinuses: Moderate paranasal sinus mucosal thickening. Soft tissues: Right periorbital and right face edema CT CERVICAL SPINE FINDINGS Pharynx and larynx: Edematous appearance of the oropharynx without visible mass or discrete, drainable fluid collection. Small volume of retropharyngeal edema. Salivary glands: Edema surrounding the submandibular glands and parotid glands bilaterally.  Otherwise, unremarkable. Thyroid: Unremarkable. Lymph nodes: No visible enlarged lymph nodes; however, the edema limits assessment. Skeleton: Approximately 7 mm of anterolisthesis of C3 on C4, probably degenerative given similar findings on the prior CT head (partially imaged) and severe facet arthropathy at this level. Upper chest: Mild interlobular septal thickening in the visualized lung apices, suggestive of interstitial edema. Other: Extensive trans spatial edema throughout the neck, , including small volume prevertebral fluid and subcutaneous edema involving all spaces of the neck. No visible discrete, drainable  fluid collection on this noncontrast study. IMPRESSION: CT head: No evidence of acute intracranial abnormality. CT maxillofacial: 1. Right periorbital and right face edema, which could be related to contusion (given reported trauma), infection/cellulitis, or possibly angioedema given findings in the neck (see below). No discrete, drainable fluid collection. 2. No evidence of acute fracture. 3. Mild sclerosis of the right mandible, nonspecific but osteomyelitis is not excluded given the history of jaw infection. 4. Moderate paranasal sinus mucosal thickening. CT neck: 1. Extensive/diffuse edema throughout the neck without visible discrete, drainable fluid collection on this noncontrast study. Findings could be secondary to infection, angioedema, and/or anasarca. 2. Mild interlobular septal thickening in the visualized lung apices, suggestive of interstitial edema. 3. Approximately 7 mm of anterolisthesis of C3 on C4, probably degenerative given similar findings on the prior CT head (partially imaged) and severe facet arthropathy at this level. An MRI could better assess for canal or foraminal stenosis clinically warranted. Electronically Signed   By: Margaretha Sheffield M.D.   On: 2021/12/04 14:09   CT Head Wo Contrast  Result Date: 12/04/2021 CLINICAL DATA:  Facial trauma, blunt EXAM: CT HEAD WITHOUT CONTRAST CT MAXILLOFACIAL WITHOUT CONTRAST CT NECK WITHOUT CONTRAST TECHNIQUE: Multidetector CT imaging of the head, cervical spine, and maxillofacial structures were performed using the standard protocol without intravenous contrast. Multiplanar CT image reconstructions of the cervical spine and maxillofacial structures were also generated. Multidetector CT imaging of the neck was performed using the standard protocol without contrast. RADIATION DOSE REDUCTION: This exam was performed according to the departmental dose-optimization program which includes automated exposure control, adjustment of the mA and/or kV  according to patient size and/or use of iterative reconstruction technique. COMPARISON:  None Available. FINDINGS: CT HEAD FINDINGS Brain: No evidence of acute infarction, hemorrhage, hydrocephalus, extra-axial collection or mass lesion/mass effect. Vascular: No hyperdense vessel identified. Skull: No acute fracture. Other: No mastoid effusions. CT MAXILLOFACIAL FINDINGS Osseous: No fracture or mandibular dislocation. No destructive process. Orbits: Right periorbital preseptal edema. Globes, retrobulbar fat, extraocular muscles and optic nerves appear unremarkable. Sinuses: Moderate paranasal sinus mucosal thickening. Soft tissues: Right periorbital and right face edema CT CERVICAL SPINE FINDINGS Pharynx and larynx: Edematous appearance of the oropharynx without visible mass or discrete, drainable fluid collection. Small volume of retropharyngeal edema. Salivary glands: Edema surrounding the submandibular glands and parotid glands bilaterally. Otherwise, unremarkable. Thyroid: Unremarkable. Lymph nodes: No visible enlarged lymph nodes; however, the edema limits assessment. Skeleton: Approximately 7 mm of anterolisthesis of C3 on C4, probably degenerative given similar findings on the prior CT head (partially imaged) and severe facet arthropathy at this level. Upper chest: Mild interlobular septal thickening in the visualized lung apices, suggestive of interstitial edema. Other: Extensive trans spatial edema throughout the neck, , including small volume prevertebral fluid and subcutaneous edema involving all spaces of the neck. No visible discrete, drainable fluid collection on this noncontrast study. IMPRESSION: CT head: No evidence of acute intracranial abnormality. CT maxillofacial: 1. Right periorbital and right face edema,  which could be related to contusion (given reported trauma), infection/cellulitis, or possibly angioedema given findings in the neck (see below). No discrete, drainable fluid collection. 2. No  evidence of acute fracture. 3. Mild sclerosis of the right mandible, nonspecific but osteomyelitis is not excluded given the history of jaw infection. 4. Moderate paranasal sinus mucosal thickening. CT neck: 1. Extensive/diffuse edema throughout the neck without visible discrete, drainable fluid collection on this noncontrast study. Findings could be secondary to infection, angioedema, and/or anasarca. 2. Mild interlobular septal thickening in the visualized lung apices, suggestive of interstitial edema. 3. Approximately 7 mm of anterolisthesis of C3 on C4, probably degenerative given similar findings on the prior CT head (partially imaged) and severe facet arthropathy at this level. An MRI could better assess for canal or foraminal stenosis clinically warranted. Electronically Signed   By: Margaretha Sheffield M.D.   On: December 03, 2021 14:09   CT CHEST ABDOMEN PELVIS WO CONTRAST  Result Date: December 03, 2021 CLINICAL DATA:  Shortness of breath and abdominal pain. EXAM: CT CHEST, ABDOMEN AND PELVIS WITHOUT CONTRAST TECHNIQUE: Multidetector CT imaging of the chest, abdomen and pelvis was performed following the standard protocol without IV contrast. RADIATION DOSE REDUCTION: This exam was performed according to the departmental dose-optimization program which includes automated exposure control, adjustment of the mA and/or kV according to patient size and/or use of iterative reconstruction technique. COMPARISON:  Sep 12, 2021. FINDINGS: CT CHEST FINDINGS Cardiovascular: No significant vascular findings. Normal heart size. No pericardial effusion. Mediastinum/Nodes: Thyroid gland is unremarkable. Endotracheal and nasogastric tubes are in good position. No definite adenopathy is noted on these unenhanced images. Lungs/Pleura: No pneumothorax or pleural effusion is noted. Left lower lobe airspace opacity is noted concerning for pneumonia, potentially related to aspiration. Right lung is clear. Musculoskeletal: No chest wall  mass or suspicious bone lesions identified. CT ABDOMEN PELVIS FINDINGS Hepatobiliary: Gallbladder is not clearly visualized. Stable calcifications seen in right hepatic lobe. No definite biliary dilatation is noted. Pancreas: Unremarkable. No pancreatic ductal dilatation or surrounding inflammatory changes. Spleen: Normal in size without focal abnormality. Adrenals/Urinary Tract: Adrenal glands are unremarkable. Kidneys are normal, without renal calculi, focal lesion, or hydronephrosis. Urinary bladder is decompressed secondary to Foley catheter. Stomach/Bowel: Stomach is unremarkable. The appendix appears normal. There is no evidence of bowel obstruction or inflammation. Vascular/Lymphatic: Aortic atherosclerosis. No enlarged abdominal or pelvic lymph nodes. Reproductive: Uterus is not clearly visualized. No definite adnexal abnormality is noted. Other: No abdominal wall hernia or abnormality. No abdominopelvic ascites. Musculoskeletal: No acute or significant osseous findings. IMPRESSION: Left lower lobe airspace opacity is noted concerning for pneumonia, possibly related to aspiration. Endotracheal nasogastric tubes are in grossly good position. No definite acute abnormality seen in the abdomen or pelvis. Electronically Signed   By: Marijo Conception M.D.   On: 12-03-21 14:04   DG Chest Portable 1 View  Result Date: 12/03/21 CLINICAL DATA:  Endotracheal tube. EXAM: PORTABLE CHEST 1 VIEW COMPARISON:  Same day. FINDINGS: Endotracheal tube is in grossly good position. Nasogastric tube tip is seen in proximal stomach. IMPRESSION: Endotracheal and nasogastric tubes are in grossly good position. Electronically Signed   By: Marijo Conception M.D.   On: 12/03/21 12:57   DG Chest Portable 1 View  Result Date: 12-03-21 CLINICAL DATA:  Increasing shortness of breath. EXAM: PORTABLE CHEST 1 VIEW COMPARISON:  Radiographs earlier the same date and 09/11/2021 and 10/26/2019. FINDINGS: 1206 hours. The heart size and  mediastinal contours are stable. There is poor definition of the  pulmonary vasculature with increasing perihilar opacities bilaterally suspicious for edema. No confluent airspace opacity, pneumothorax or significant pleural effusion identified. Telemetry leads overlie the chest. The bones appear unremarkable. IMPRESSION: Increasing pulmonary opacities bilaterally suspicious for edema or atypical infection. No significant pleural effusion. Electronically Signed   By: Richardean Sale M.D.   On: 11-07-2021 12:21   DG Abdomen 1 View  Result Date: 2021-11-07 CLINICAL DATA:  Increased shortness of breath for a few days. EXAM: ABDOMEN - 1 VIEW; PORTABLE CHEST - 1 VIEW COMPARISON:  KUB 07/02/2020; CT abdomen and pelvis 09/12/2020; AP chest 09/11/2021 FINDINGS: AP chest: Cardiac silhouette and mediastinal contours are within normal limits. Nipple shadows are noted. The lungs clear. No pleural effusion or pneumothorax. Abdomen: Single frontal view of the upper abdomen. Unchanged focal calcification bordering the inferior aspect of the right hepatic lobe. Note is made of history of cholecystectomy seen on prior CT. The prior common bile duct stent seen on CT 07/04/2020 and KUB 07/02/2020 is no longer visualized. Air is seen within nondistended loops of small and large bowel within the upper abdomen. No portal venous gas or pneumatosis. No subdiaphragmatic free air on the current upright view. Mild levocurvature centered at L3. Mild right L3-4 and mild diffuse L4-5 disc space narrowing. IMPRESSION: 1. No acute cardiopulmonary disease process. 2. Nonobstructed bowel-gas pattern within the abdomen. Note is made that the pelvis was not imaged. Electronically Signed   By: Yvonne Kendall M.D.   On: 11/07/2021 10:27   DG Chest Port 1 View  Result Date: 11-07-2021 CLINICAL DATA:  Increased shortness of breath for a few days. EXAM: ABDOMEN - 1 VIEW; PORTABLE CHEST - 1 VIEW COMPARISON:  KUB 07/02/2020; CT abdomen and pelvis  09/12/2020; AP chest 09/11/2021 FINDINGS: AP chest: Cardiac silhouette and mediastinal contours are within normal limits. Nipple shadows are noted. The lungs clear. No pleural effusion or pneumothorax. Abdomen: Single frontal view of the upper abdomen. Unchanged focal calcification bordering the inferior aspect of the right hepatic lobe. Note is made of history of cholecystectomy seen on prior CT. The prior common bile duct stent seen on CT 07/04/2020 and KUB 07/02/2020 is no longer visualized. Air is seen within nondistended loops of small and large bowel within the upper abdomen. No portal venous gas or pneumatosis. No subdiaphragmatic free air on the current upright view. Mild levocurvature centered at L3. Mild right L3-4 and mild diffuse L4-5 disc space narrowing. IMPRESSION: 1. No acute cardiopulmonary disease process. 2. Nonobstructed bowel-gas pattern within the abdomen. Note is made that the pelvis was not imaged. Electronically Signed   By: Yvonne Kendall M.D.   On: 2021/11/07 10:27   US Venous Img Lower Unilateral Right  Result Date: 10/31/2021 CLINICAL DATA:  RIGHT leg swelling EXAM: RIGHT LOWER EXTREMITY VENOUS DOPPLER ULTRASOUND TECHNIQUE: Gray-scale sonography with compression, as well as color and duplex ultrasound, were performed to evaluate the deep venous system(s) from the level of the common femoral vein through the popliteal and proximal calf veins. COMPARISON:  None FINDINGS: VENOUS Normal compressibility of the common femoral, superficial femoral, and popliteal veins, as well as the visualized calf veins. Visualized portions of profunda femoral vein and great saphenous vein unremarkable. No filling defects to suggest DVT on grayscale or color Doppler imaging. Doppler waveforms show normal direction of venous flow, normal respiratory plasticity and response to augmentation. Limited views of the contralateral common femoral vein are unremarkable. OTHER Small Baker cyst 6.1 x 5.0 x 1.6 cm at  RIGHT popliteal fossa.  Limitations: none IMPRESSION: No evidence of deep venous thrombosis in the RIGHT lower extremity. Small Baker cyst RIGHT popliteal fossa. Electronically Signed   By: Lavonia Dana M.D.   On: 10/31/2021 14:27    Microbiology Recent Results (from the past 240 hour(s))  MRSA Next Gen by PCR, Nasal     Status: None   Collection Time: Nov 28, 2021  2:40 PM   Specimen: Nasal Mucosa; Nasal Swab  Result Value Ref Range Status   MRSA by PCR Next Gen NOT DETECTED NOT DETECTED Final    Comment: (NOTE) The GeneXpert MRSA Assay (FDA approved for NASAL specimens only), is one component of a comprehensive MRSA colonization surveillance program. It is not intended to diagnose MRSA infection nor to guide or monitor treatment for MRSA infections. Test performance is not FDA approved in patients less than 53 years old. Performed at [facility], [address] [place], [place] [address]     Lab Basic Metabolic Panel: Recent Labs  Lab 11-28-21 1027 11-28-2021 1445  NA 124*  --   K 3.2*  --   CL 98  --   CO2 11*  --   GLUCOSE 74  --   BUN 49*  --   CREATININE 2.38*  --   CALCIUM 7.6*  --   MG  --  1.4*   Liver Function Tests: Recent Labs  Lab 2021-11-28 1027  AST 101*  ALT 53*  ALKPHOS 179*  BILITOT 0.5  PROT 6.2*  ALBUMIN 2.7*   Recent Labs  Lab November 28, 2021 1445  LIPASE 28  AMYLASE 40   No results for input(s): "AMMONIA" in the last 168 hours. CBC: Recent Labs  Lab November 28, 2021 1027 11-28-2021 1731  WBC 6.8 9.6  NEUTROABS 6.3  --   HGB 9.4* 8.5*  HCT 29.7* 28.0*  MCV 101.7* 104.9*  PLT 195 179   Cardiac Enzymes: No results for input(s): "CKTOTAL", "CKMB", "CKMBINDEX", "TROPONINI" in the last 168 hours. Sepsis Labs: Recent Labs  Lab 11/28/21 1027 11-28-21 1210 11-28-2021 1445 Nov 28, 2021 1731  PROCALCITON  --   --  >150.00  --   WBC 6.8  --   --  9.6  LATICACIDVEN 3.2* 2.9* 1.8 2.2*      [place] Nov 28, 2021, 7:09 PM

## 2021-11-21 NOTE — H&P (Addendum)
NAME:  Katherine Perez, MRN:  [id number], DOB:  1971/03/08, LOS: 0 ADMISSION DATE:  11-18-21  CHIEF COMPLAINT:  RESP FAILURE  BRIEF SYNOPSIS  51 yo WF with multiple medical issues with cough and congestion, h/o ASTHMA Came to ER with facial swelling, developed severe resp failure and needing emergent intubation with elevated BNP possible acute sCHF   History of Present Illness:  51 y.o. female who had been seen at urgent care over a week ago for some congestion.   She got some antibiotics but really did not get much better and then yesterday or so began to feel more congested and with more of a cough additionally she developed swelling around her right eye and under her chin.    + has become increasingly short of breath.  +abdominal pain to which is perhaps some what worse than her usual pain that she has with her abdominal/colon problems.    She uses laxatives chronically per husband reports.  Her husband also reports that she had a jaw infection some years ago which presented with swelling something like she is having now.    Pt comes with c/o increased SOB for few days. Pt states last night she noticed left sided facial swelling and swelling in her throat.  Pt states she is able to swallow ok.   Pt does have labored breathing and appears in distress.  Current O2 at 70% RA.   Pt placed on O2 at this time. 2L and only to 82%. Increased to 3L 84%. Pt taken to room at this time.  Patient developed worsening resp failure which led to emergent intubation Patient with severe acidosis and severe cyanosis             Pertinent  Medical History   Active Ambulatory Problems    Diagnosis Date Noted   Hyponatremia 10/26/2019   Increased anion gap metabolic acidosis 57/32/2025   Acute renal failure superimposed on stage 4 chronic kidney disease ([place]) 42/70/6237   Acute metabolic encephalopathy 62/83/1517   Leukocytosis 10/26/2019   Pressure injury of skin  10/27/2019   Acute pancreatitis without infection or necrosis 04/23/2020   GERD without esophagitis 04/23/2020   Hypokalemia 04/23/2020   Prolonged QT interval 04/23/2020   Hypercalcemia    Hyperkalemia 06/27/2020   CKD (chronic kidney disease), stage IV ([place]) 06/27/2020   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   Bile leak 06/30/2020   Other specified diseases of biliary tract    Disease of biliary tract, unspecified    Metabolic acidosis 61/60/7371   AKI (acute kidney injury) ([place]) 08/09/2020   IDA (iron deficiency anemia) 10/07/2020   Encounter for adjustment or removal of myringotomy device (stent) (tube)    Neuropathy    Slow transit constipation 09/03/2018   Chronic interstitial cystitis 11/18/2014   Laxative habit 12/31/2014   Rheumatoid arthritis involving multiple sites with positive rheumatoid factor ([place]) 10/27/2020   Chronic constipation    Decubitus ulcer of coccyx, stage 2 ([place]) 10/17/9483   Acute metabolic acidosis 46/27/0350   Asthma, chronic 09/12/2021   Peripheral neuropathy 09/12/2021   Gastroparesis 09/13/2021   Anorexia 09/13/2021   Eating disorder 09/14/2021   Protein-calorie malnutrition, severe 09/14/2021   Resolved Ambulatory Problems    Diagnosis Date Noted   Sepsis ([place]) 06/29/2020   Past Medical History:  Diagnosis Date   Anemia    Asthma    CKD (chronic kidney disease)    ETOH abuse    GERD (gastroesophageal reflux disease)    IC (  interstitial cystitis)    Laxative abuse    Pneumonia    Raynaud disease    Past Surgical History:  Procedure Laterality Date   COLONOSCOPY     ERCP N/A 07/01/2020   Procedure: ENDOSCOPIC RETROGRADE CHOLANGIOPANCREATOGRAPHY (ERCP);  Surgeon: Lucilla Lame, MD;  Location: [facility] ENDOSCOPY;  Service: Endoscopy;  Laterality: N/A;   ERCP N/A 10/18/2020   Procedure: ENDOSCOPIC RETROGRADE CHOLANGIOPANCREATOGRAPHY (ERCP) STENT REMOVAL;  Surgeon: Lucilla Lame, MD;  Location: [facility] ENDOSCOPY;  Service: Endoscopy;  Laterality:  N/A;   ESOPHAGOGASTRODUODENOSCOPY     FRACTURE SURGERY     femur fracture left - 2019   LEG SURGERY       Significant Hospital Events: Including procedures, antibiotic start and stop dates in addition to other pertinent events   7/17 admitted for resp failure     Micro Data:  BLOOD CX pending COVID PENDING   Antimicrobials:   Antibiotics Given (last 72 hours)     Date/Time Action Medication Dose Rate   2021/11/22 1243 New Bag/Given   ceFEPIme (MAXIPIME) 2 g in sodium chloride 0.9 % 100 mL IVPB 2 g 200 mL/hr             Objective   Blood pressure (!) 87/70, pulse (!) 108, temperature 98 F (36.7 C), resp. rate 19, SpO2 (!) 83 %.       No intake or output data in the 24 hours ending 11-22-21 1330 There were no vitals filed for this visit.    REVIEW OF SYSTEMS  PATIENT IS UNABLE TO PROVIDE COMPLETE REVIEW OF SYSTEMS DUE TO SEVERE CRITICAL ILLNESS AND TOXIC METABOLIC ENCEPHALOPATHY   PHYSICAL EXAMINATION:  GENERAL:critically ill appearing, +resp distress EYES: Pupils equal, round, reactive to light.  No scleral icterus.  MOUTH: Moist mucosal membrane. INTUBATED NECK: Supple.  PULMONARY: +rhonchi, +wheezing CARDIOVASCULAR: S1 and S2.  No murmurs  GASTROINTESTINAL: Soft, nontender, -distended. Positive bowel sounds.  MUSCULOSKELETAL: No swelling, clubbing, or edema.  NEUROLOGIC: obtunded SKIN:intact,warm,dry    Labs/imaging that I havepersonally reviewed  (right click and "Reselect all SmartList Selections" daily)        ASSESSMENT AND PLAN SYNOPSIS  51 yo WF with severe multiorgan failure likely due to septic shock with respiratory, renal, metabolic encephalopathy and severe shock  Severe ACUTE Hypoxic and Hypercapnic Respiratory Failure -continue Mechanical Ventilator support -continue Bronchodilator Therapy -Wean Fio2 and PEEP as tolerated -VAP/VENT bundle implementation    SEVERE COPD EXACERBATION - IV steroids as  prescribed -continue NEB THERAPY as prescribed -morphine as needed -wean fio2 as needed and tolerated   CARDIAC FAILURE-acute  systolic dysfunction -oxygen as needed -Lasix as tolerated -follow up cardiac enzymes as indicated   CARDIAC ICU monitoring   ACUTE KIDNEY INJURY/Renal Failure SEVERE ACIDOSIS  -continue Foley Catheter-assess need -Avoid nephrotoxic agents -Follow urine output, BMP -Ensure adequate renal perfusion, optimize oxygenation -Renal dose medications STRART BICARB INFUSION  No intake or output data in the 24 hours ending 11/22/2021 1330   NEUROLOGY Acute metabolic encephalopathy, need for sedation Goal RASS -2 to -3 Severe acidosis   SEPTIC SHOCK SOURCE-?UTI Mandibular infection? -use vasopressors to keep MAP>65 as needed -follow ABG and LA -follow up cultures -emperic ABX -consider stress dose steroids -aggressive IV fluid resuscitation  INFECTIOUS DISEASE -continue antibiotics as prescribed -follow up cultures   ENDO - ICU hypoglycemic\Hyperglycemia protocol -check FSBS per protocol   GI GI PROPHYLAXIS as indicated  NUTRITIONAL STATUS DIET-NPO Constipation protocol as indicated   ELECTROLYTES -follow labs as needed -replace as needed -pharmacy  consultation and following   ACUTE ANEMIA- TRANSFUSE AS NEEDED CONSIDER TRANSFUSION  IF HGB<7 DVT PRX with TED/SCD's ONLY     Best practice (right click and "Reselect all SmartList Selections" daily)  Diet: NPO Pain/Anxiety/Delirium protocol (if indicated): Yes (RASS goal -1) VAP protocol (if indicated): Yes DVT prophylaxis: Subcutaneous Heparin GI prophylaxis: H2B  Central venous access:  N/A Arterial line:  N/A Foley:  Yes, and it is still needed Mobility:  bed rest  Code Status:  FULL Disposition:ICU  Labs   CBC: Recent Labs  Lab 11/29/21 1027  WBC 6.8  NEUTROABS 6.3  HGB 9.4*  HCT 29.7*  MCV 101.7*  PLT 563    Basic Metabolic Panel: Recent Labs  Lab  11/29/2021 1027  NA 124*  K 3.2*  CL 98  CO2 11*  GLUCOSE 74  BUN 49*  CREATININE 2.38*  CALCIUM 7.6*   GFR: Estimated Creatinine Clearance: 25.3 mL/min (A) (by C-G formula based on SCr of 2.38 mg/dL (H)). Recent Labs  Lab November 29, 2021 1027 2021/11/29 1210  WBC 6.8  --   LATICACIDVEN 3.2* 2.9*    Liver Function Tests: Recent Labs  Lab 2021/11/29 1027  AST 101*  ALT 53*  ALKPHOS 179*  BILITOT 0.5  PROT 6.2*  ALBUMIN 2.7*   No results for input(s): "LIPASE", "AMYLASE" in the last 168 hours. No results for input(s): "AMMONIA" in the last 168 hours.  ABG    Component Value Date/Time   HCO3 10.6 (L) 09/11/2021 2137   TCO2 14 (L) 06/27/2020 1042   ACIDBASEDEF 17.2 (H) 09/11/2021 2137   O2SAT 48.8 09/11/2021 2137     CBG: Recent Labs  Lab 11-29-2021 1209  GLUCAP 70     Past Medical History:  She,  has a past medical history of Anemia, Asthma, CKD (chronic kidney disease), ETOH abuse, GERD (gastroesophageal reflux disease), IC (interstitial cystitis), IDA (iron deficiency anemia) (10/07/2020), Laxative abuse, Neuropathy, Pneumonia, and Raynaud disease.   Surgical History:   Past Surgical History:  Procedure Laterality Date   COLONOSCOPY     ERCP N/A 07/01/2020   Procedure: ENDOSCOPIC RETROGRADE CHOLANGIOPANCREATOGRAPHY (ERCP);  Surgeon: Lucilla Lame, MD;  Location: [facility] ENDOSCOPY;  Service: Endoscopy;  Laterality: N/A;   ERCP N/A 10/18/2020   Procedure: ENDOSCOPIC RETROGRADE CHOLANGIOPANCREATOGRAPHY (ERCP) STENT REMOVAL;  Surgeon: Lucilla Lame, MD;  Location: [facility]COPY;  Service: Endoscopy;  Laterality: N/A;   ESOPHAGOGASTRODUODENOSCOPY     FRACTURE SURGERY     femur fracture left - 2019   LEG SURGERY       Social History:   reports that she has been smoking cigarettes. She has been smoking an average of 1 pack per day. She has never used smokeless tobacco. She reports current alcohol use. She reports current drug use. Drug: Marijuana.   Family History:  Her  family history is negative for Other.   Allergies Allergies  Allergen Reactions   Cetirizine Hives   Diazepam Other (See Comments)    Depression    Baclofen Anxiety and Other (See Comments)    AMS - "Really messed me up, caused me to drop things"      Home Medications  Prior to Admission medications   Medication Sig Start Date End Date Taking? Authorizing Provider  ADBRY 150 MG/ML SOSY Inject into the skin. 04/27/21   [provider]  albuterol (VENTOLIN HFA) 108 (90 Base) MCG/ACT inhaler Inhale 2 puffs into the lungs every 4 (four) hours as needed for shortness of breath or wheezing. 06/07/19  [provider]  amitriptyline (ELAVIL) 25 MG tablet Take 50 mg by mouth at bedtime.    [provider]  Brimonidine Tartrate 0.025 % SOLN Place 1 drop into both eyes daily.    [provider]  Difluprednate 0.05 % EMUL Apply 1 drop to eye daily. 08/24/21   [provider]  doxepin (SINEQUAN) 50 MG capsule Take 50 mg by mouth at bedtime.    [provider]  DULoxetine (CYMBALTA) 20 MG capsule Take 40 mg by mouth in the morning.    [provider]  Dupilumab (DUPIXENT [place]) Inject 300 mg into the skin every 14 (fourteen) days. (Fridays)    [provider]  famotidine (PEPCID) 40 MG tablet Take 40 mg by mouth at bedtime. 10/07/19   [provider]  fexofenadine (ALLEGRA) 180 MG tablet Take 180 mg by mouth daily as needed for allergies.    [provider]  FIBER PO Take 1 capsule by mouth daily.    [provider]  fluocinonide cream (LIDEX) 3.78 % Apply 1 application topically 2 (two) times daily as needed (skin irritations).    [provider]  fluticasone (FLONASE) 50 MCG/ACT nasal spray Place 1-2 sprays into both nostrils 2 (two) times daily as needed for allergies or rhinitis.    [provider]  Fluticasone-Salmeterol (ADVAIR) 250-50 MCG/DOSE AEPB Inhale 1 puff into the lungs 2 (two)  times daily. 09/14/19   [provider]  gabapentin (NEURONTIN) 300 MG capsule Take 1 capsule (300 mg total) by mouth 2 (two) times daily. 08/12/20   Sharen Hones, MD  leflunomide (ARAVA) 20 MG tablet Take 20 mg by mouth daily.    [provider]  medroxyPROGESTERone (DEPO-PROVERA) 150 MG/ML injection Inject 150 mg into the muscle every 3 (three) months.    [provider]  melatonin 3 MG TABS tablet Take 2 tablets (6 mg total) by mouth at bedtime. 07/06/20   Val Riles, MD  Meth-Hyo-M Barnett Hatter Phos-Ph Sal (URIBEL) 118 MG CAPS Take 1 capsule by mouth 3 (three) times daily as needed (Urinary symptoms).    [provider]  metoCLOPramide (REGLAN) 10 MG tablet Take 10 mg by mouth in the morning and at bedtime. 10/17/19   [provider]  neomycin-polymyxin b-dexamethasone (MAXITROL) 3.5-10000-0.1 SUSP Place 1 drop into both eyes every 6 (six) hours. 07/19/21   [provider]  oxybutynin (DITROPAN-XL) 10 MG 24 hr tablet Take 10 mg by mouth daily in the afternoon.    [provider]  pentosan polysulfate (ELMIRON) 100 MG capsule Take 100 mg by mouth at bedtime.    [provider]  Plecanatide 3 MG TABS Take 3 mg by mouth daily at 6 (six) AM.     [provider]  potassium chloride SA (KLOR-CON) 20 MEQ tablet Take 1 tablet (20 mEq total) by mouth 2 (two) times daily. 03/11/21   Annita Brod, MD  prednisoLONE acetate (PRED FORTE) 1 % ophthalmic suspension Place 1 drop into both eyes 4 (four) times daily. 08/20/21   [provider]  promethazine (PHENERGAN) 25 MG tablet Take 25 mg by mouth every 8 (eight) hours as needed for nausea or vomiting.  10/23/19   [provider]  RABEprazole (ACIPHEX) 20 MG tablet Take 20 mg by mouth in the morning and at bedtime.    [provider]  sodium citrate-citric acid (ORACIT) 500-334 MG/5ML solution Take 15 mLs by mouth in the morning and at bedtime. 05/30/20  [provider]  SPIRIVA RESPIMAT 2.5 MCG/ACT AERS SMARTSIG:2 Puff(s) By Mouth Daily 08/22/21   [provider]  tamsulosin (FLOMAX) 0.4 MG CAPS capsule Take 0.4 mg by mouth at bedtime. 10/10/19   [provider]  theophylline (UNIPHYL) 400 MG 24 hr tablet Take 400 mg by mouth 2 (two) times daily.  10/10/19   [provider]  thiamine (VITAMIN B-1) 50 MG tablet Take 50 mg by mouth daily.    [provider]  traMADol (ULTRAM) 50 MG tablet Take 50 mg by mouth every 6 (six) hours as needed for moderate pain.    [provider]       DVT/GI PRX  assessed I Assessed the need for Labs I Assessed the need for Foley I Assessed the need for Central Venous Line Family Discussion when available I Assessed the need for Mobilization I made an Assessment of medications to be adjusted accordingly Safety Risk assessment completed  CASE DISCUSSED IN MULTIDISCIPLINARY ROUNDS WITH ICU TEAM     Critical Care Time devoted to patient care services described in this note is 65 minutes.   Critical care was necessary to treat /prevent imminent and life-threatening deterioration.   PATIENT WITH VERY POOR PROGNOSIS I ANTICIPATE PROLONGED ICU LOS  Patient is critically ill. Patient with Multiorgan failure and at high risk for cardiac arrest and death.    Corrin Parker, M.D.  [facility]  Medical Director [place] Director [facility] Cardio-Pulmonary Department

## 2021-11-21 NOTE — ED Provider Notes (Addendum)
[facility] Provider Note    Event Date/Time   First MD Initiated Contact with Patient 11/08/21 1006     (approximate)   History   Shortness of Breath and Facial Swelling   HPI  Katherine Perez is a 51 y.o. female who had been seen at urgent care over a week ago for some congestion.  She got some antibiotics but really did not get much better and then yesterday or so began to feel more congested and with more of a cough additionally she developed swelling around her right eye and under her chin.  She has become increasingly short of breath.  She has some abdominal pain to which is perhaps some what worse than her usual pain that she has with her abdominal/colon problems.  She uses laxatives chronically per husband reports.  Her husband also reports that she had a jaw infection some years ago which presented with swelling something like she is having now.  Patient has not been having any fever.  The areas that are swollen are not reddened at all and her not warm.  She is not having any itching and has not had any new medicines or foods.      Physical Exam   Triage Vital Signs: ED Triage Vitals [11/08/2021 0957]  Enc Vitals Group     BP 125/66     Pulse Rate (!) 130     Resp (!) 25     Temp 98 F (36.7 C)     Temp src      SpO2 (!) 85 %     Weight      Height      Head Circumference      Peak Flow      Pain Score      Pain Loc      Pain Edu?      Excl. in [place]?     Most recent vital signs: Vitals:   2021-11-08 1438 2021/11/08 1505  BP: 90/63   Pulse: (!) 106   Resp: (!) 22   Temp:    SpO2: (!) 73% (!) 71%     General: Awake, looks uncomfortable Head normocephalic atraumatic but some swelling around the right eye the eye itself is not injected pupils round reactive there is also some swelling under the chin which is not rigid.  It is more soft.  There is nothing going on inside the mouth.  I do not hear any stridor. CV:  Good  peripheral perfusion.  Heart regular rate and rhythm no audible murmurs Resp:  Lungs some increased rhonchi few crackles and increased work of breathing Abd:  No distention.  Bowel sounds are decreased there is some discomfort on palpation around the umbilicus Extremities legs are mottled and have edema.   ED Results / Procedures / Treatments   Labs (all labs ordered are listed, but only abnormal results are displayed) Labs Reviewed  LACTIC ACID, PLASMA - Abnormal; Notable for the following components:      Result Value   Lactic Acid, Venous 3.2 (*)    All other components within normal limits  LACTIC ACID, PLASMA - Abnormal; Notable for the following components:   Lactic Acid, Venous 2.9 (*)    All other components within normal limits  CBC WITH DIFFERENTIAL/PLATELET - Abnormal; Notable for the following components:   RBC 2.92 (*)    Hemoglobin 9.4 (*)    HCT 29.7 (*)    MCV 101.7 (*)  RDW 15.6 (*)    Lymphs Abs 0.1 (*)    All other components within normal limits  COMPREHENSIVE METABOLIC PANEL - Abnormal; Notable for the following components:   Sodium 124 (*)    Potassium 3.2 (*)    CO2 11 (*)    BUN 49 (*)    Creatinine, Ser 2.38 (*)    Calcium 7.6 (*)    Total Protein 6.2 (*)    Albumin 2.7 (*)    AST 101 (*)    ALT 53 (*)    Alkaline Phosphatase 179 (*)    GFR, Estimated 24 (*)    All other components within normal limits  SEDIMENTATION RATE - Abnormal; Notable for the following components:   Sed Rate 68 (*)    All other components within normal limits  BRAIN NATRIURETIC PEPTIDE - Abnormal; Notable for the following components:   B Natriuretic Peptide 2,049.5 (*)    All other components within normal limits  D-DIMER, QUANTITATIVE - Abnormal; Notable for the following components:   D-Dimer, Quant 7.62 (*)    All other components within normal limits  MAGNESIUM - Abnormal; Notable for the following components:   Magnesium 1.4 (*)    All other components within  normal limits  BLOOD GAS, ARTERIAL - Abnormal; Notable for the following components:   pH, Arterial <6.95 (*)    pO2, Arterial 55 (*)    Bicarbonate 8.6 (*)    Acid-base deficit 23.7 (*)    All other components within normal limits  TROPONIN I (HIGH SENSITIVITY) - Abnormal; Notable for the following components:   Troponin I (High Sensitivity) 109 (*)    All other components within normal limits  TROPONIN I (HIGH SENSITIVITY) - Abnormal; Notable for the following components:   Troponin I (High Sensitivity) 141 (*)    All other components within normal limits  CULTURE, BLOOD (ROUTINE X 2)  CULTURE, BLOOD (ROUTINE X 2)  MRSA NEXT GEN BY PCR, NASAL  LACTIC ACID, PLASMA  LIPASE, BLOOD  AMYLASE  CBC  LACTIC ACID, PLASMA  PROCALCITONIN  URINE DRUG SCREEN, QUALITATIVE ([facility] ONLY)  URINALYSIS, COMPLETE (UACMP) WITH MICROSCOPIC  CBG MONITORING, ED  POC SARS CORONAVIRUS 2 AG -  ED   Patient initially doing well on oxygen sats come up well then fairly suddenly decompensates becomes agitated and hypoxic.  See note below.  EKG  EKG read interpreted by me shows sinus tachycardia rate of 121 normal axis very irregular baseline makes the T waves hard to visualize but there appears to be some nonspecific ST-T changes.  QT interval QTc interval I should say is 520 ms.   RADIOLOGY Chest x-ray read by radiology as no acute disease I reviewed and interpreted the films as possible CHF.  There appears to be increased vascular markings.  This would go along with the patient's clinical exam as well.  We will get a BNP and see what that looks like. Abdominal films appear to show a lot of bowel gas possibly in the colon.  The walls are very smooth there does not appear to be any air-fluid levels though. Chest x-ray #2 read and interpreted by me shows slightly increased markings.  Possible CHF or pneumonia Chest x-ray #3 ET tube and OG tube in good position PROCEDURES:  Critical Care performed: Critical  care time 1-1/2 hours.  This includes initial evaluation of patient and then a lot of time spent at the bedside and the patient started to crash.  I spoke with  the husband several times as well.  He told me the patient is a no code.  But she will take intubation for brief periods if it is thought to be reversible process requiring intubation  Procedures patient sedated with Versed given rocuronium and intubated with a #7 ET tube under direct vision by me.  Patient tolerated very well.  Good color change on the colorimetric trick monitor and good breath sounds bilaterally with none in the stomach.  22 at the lips.  OG tube then inserted by me as the nurse could not get the NG tube to go past the mouth.  I used the laryngoscope to get the tube into the esophagus that was inserted and checked by insufflating air with good sounds in the epigastrium.   MEDICATIONS ORDERED IN ED: Medications  albuterol (PROVENTIL) (2.5 MG/3ML) 0.083% nebulizer solution 3 mL (3 mLs Inhalation Given Nov 17, 2021 1123)  0.9 %  sodium chloride infusion ( Intravenous Not Given 2021/11/17 1202)  lactated ringers infusion (has no administration in time range)  vancomycin (VANCOCIN) IVPB 1000 mg/200 mL premix (has no administration in time range)  midazolam (VERSED) 100 mg/100 mL (1 mg/mL) premix infusion (2 mg/hr Intravenous New Bag/Given 11-17-21 1252)  sodium chloride flush (NS) 0.9 % injection 3 mL (has no administration in time range)  sodium chloride flush (NS) 0.9 % injection 3 mL (has no administration in time range)  0.9 %  sodium chloride infusion (250 mLs Intravenous New Bag/Given 2021-11-17 1508)  heparin injection 5,000 Units (has no administration in time range)  pantoprazole sodium (PROTONIX) 40 mg/20 mL oral suspension 40 mg (has no administration in time range)  famotidine (PEPCID) tablet 20 mg (has no administration in time range)  docusate (COLACE) 50 MG/5ML liquid 100 mg (has no administration in time range)   polyethylene glycol (MIRALAX / GLYCOLAX) packet 17 g (has no administration in time range)  fentaNYL (SUBLIMAZE) injection 50 mcg (has no administration in time range)  fentaNYL 2551mcg in NS 246mL (37mcg/ml) infusion-PREMIX (has no administration in time range)  fentaNYL (SUBLIMAZE) bolus via infusion 50-100 mcg (has no administration in time range)  sodium bicarbonate 150 mEq in dextrose 5 % 1,150 mL infusion ( Intravenous New Bag/Given 11/17/21 1557)  Oral care mouth rinse (has no administration in time range)  Oral care mouth rinse (has no administration in time range)  potassium chloride 10 mEq in 100 mL IVPB (has no administration in time range)  magnesium sulfate IVPB 4 g 100 mL (has no administration in time range)  0.9 %  sodium chloride infusion (has no administration in time range)  norepinephrine (LEVOPHED) 4mg  in 259mL (0.016 mg/mL) premix infusion (40 mcg/min Intravenous New Bag/Given (Non-Interop) 11-17-2021 1610)  ipratropium-albuterol (DUONEB) 0.5-2.5 (3) MG/3ML nebulizer solution 3 mL (3 mLs Nebulization Given 2021-11-17 1034)  sodium chloride 0.9 % bolus 1,000 mL (0 mLs Intravenous Stopped 11-17-2021 1248)    And  sodium chloride 0.9 % bolus 1,000 mL (0 mLs Intravenous Stopped 11/17/21 1429)    And  sodium chloride 0.9 % bolus 1,000 mL (1,000 mLs Intravenous New Bag/Given 2021/11/17 1455)  ceFEPIme (MAXIPIME) 2 g in sodium chloride 0.9 % 100 mL IVPB (0 g Intravenous Stopped Nov 17, 2021 1359)  metroNIDAZOLE (FLAGYL) IVPB 500 mg (500 mg Intravenous New Bag/Given 11/17/21 1509)  norepinephrine (LEVOPHED) 4-5 MG/250ML-% infusion SOLN (30 mcg/min  Rate/Dose Change 2021/11/17 1228)  midazolam PF (VERSED) 5 MG/ML injection (4 mg Intravenous Given November 17, 2021 1221)  rocuronium bromide 100 MG/10ML SOSY (100 mg Intravenous Given  12-05-21 1224)  sodium chloride 0.9 % bolus 1,000 mL (0 mLs Intravenous Stopped Dec 05, 2021 1248)  acetaminophen (TYLENOL) suppository 650 mg (650 mg Rectal Given Dec 05, 2021 1358)   norepinephrine (LEVOPHED) 4-5 MG/250ML-% infusion SOLN (40 mcg/min  Rate/Dose Change 12/05/21 1510)     IMPRESSION / MDM / ASSESSMENT AND PLAN / ED COURSE  I reviewed the triage vital signs and the nursing notes. ----------------------------------------- 1:05 PM on 12-05-21 ----------------------------------------- Nurse called me because patient becomes restless and oxygen sats drop when I get to the room patient is oxygen sats are lower blood pressure is dropping but she is not restless any longer.  She is not herself per her husband.  She is getting more mottled.  We started her on more oxygen order BiPAP and then fluids and pressors.  Patient is then intubated as she seems to be too confused to be using the BiPAP in case she vomits.  Fluids are going patient develops a fever we ordered some antibiotics started Levophed at this time patient's pressure is up her sats are in the 80s D-dimer is to come back at 7 swelling in her neck and around the eye and under the chin is worse and becoming a little bluish as though there is some possible bleeding under the skin.  She is not as mottled but she had been just a little bit while ago but she is still somewhat mottled her belly is bigger she told me it was getting bigger.  We will try and see if we can get her stabilized for CT.  I have paged ICU.  CT shows only a lower lobe pneumonia which is not very marked.  No other pathology seen.  Really not sure what the swelling is from.  It is possible that she fell and bruised her cell but there were no marks on the skin consistent with that.  Then that would not explain the swelling under the chin either.  Became frankly septic and we gave her antibiotics.  Perhaps there is some infection causing some of the swelling.  Has GFR is too low for [place] to use contrast.  This makes it difficult to tell if there is any phlegmon or anything developing.  Discussed CT chest with radiology who feels that there is no way to  see any kind of pulmonary embolus without contrast.  I did see some shadows intravascularly but the radiologist explained that this could be artifact or any 1 of a number of things. Either we will have to get the GFR to improve and do contrast or try VQ scan and the patient becomes more stable.   he patient is on the cardiac monitor to evaluate for evidence of arrhythmia and/or significant heart rate changes.  No arrhythmias were seen.  Patient did become hypotensive. Patient was much more hypoxic than I would have expected from a minimal lower lobe pneumonia.  No other obvious source of sepsis was seen.  Her sed rate was elevated quite high as well.  I have started her on antibiotics for unknown source hopefully this will calm things down and we will be able to resuscitate her fully get her off of pressors and investigate what is going on further.  Husband was at the bedside most of the time.  He is aware of the serious nature of the patient's illness.  Patient again is a no code but that is only in case her heart is to stop there is no desire to not treat an acute  illness.     FINAL CLINICAL IMPRESSION(S) / ED DIAGNOSES   Final diagnoses:  Sepsis, due to unspecified organism, unspecified whether acute organ dysfunction present [facility])  Facial swelling  Hypoxia  Elevated troponin  Elevated d-dimer     Rx / DC Orders   ED Discharge Orders     None        Note:  This document was prepared using Dragon voice recognition software and may include unintentional dictation errors.   Nena Polio, MD 2021/11/13 1621 ----------------------------------------- 4:45 PM on 11/13/2021 ----------------------------------------- Please note patient's husband reported the patient's legs and arms have been mottled for months.  The abdominal mottling was new when it began.  Patient initially was not running a fever did not have a white count elevation had a little bit of a left shift.  Did appear to  have CHF so I would not did not want to give a lot of fluid for the elevated lactic acid especially since she did not initially appear to be septic.  This quickly changed however.  I do want to put that addendum in.   Nena Polio, MD 11-13-21 [phone]

## 2021-11-21 DEATH — deceased

## 2022-07-16 ENCOUNTER — Other Ambulatory Visit: Payer: BC Managed Care – PPO

## 2022-07-18 ENCOUNTER — Ambulatory Visit: Payer: BC Managed Care – PPO | Admitting: Oncology

## 2023-01-07 IMAGING — DX DG CHEST 1V
1 series · 2 of 2 positions shown · non-contrast
Comparison: None Available.

CLINICAL DATA: [id number]

EXAM:
CHEST  1 VIEW

[Series 1: chest ap · 0.14mm/px · 2 of 2 slices shown]
[im 1/2]
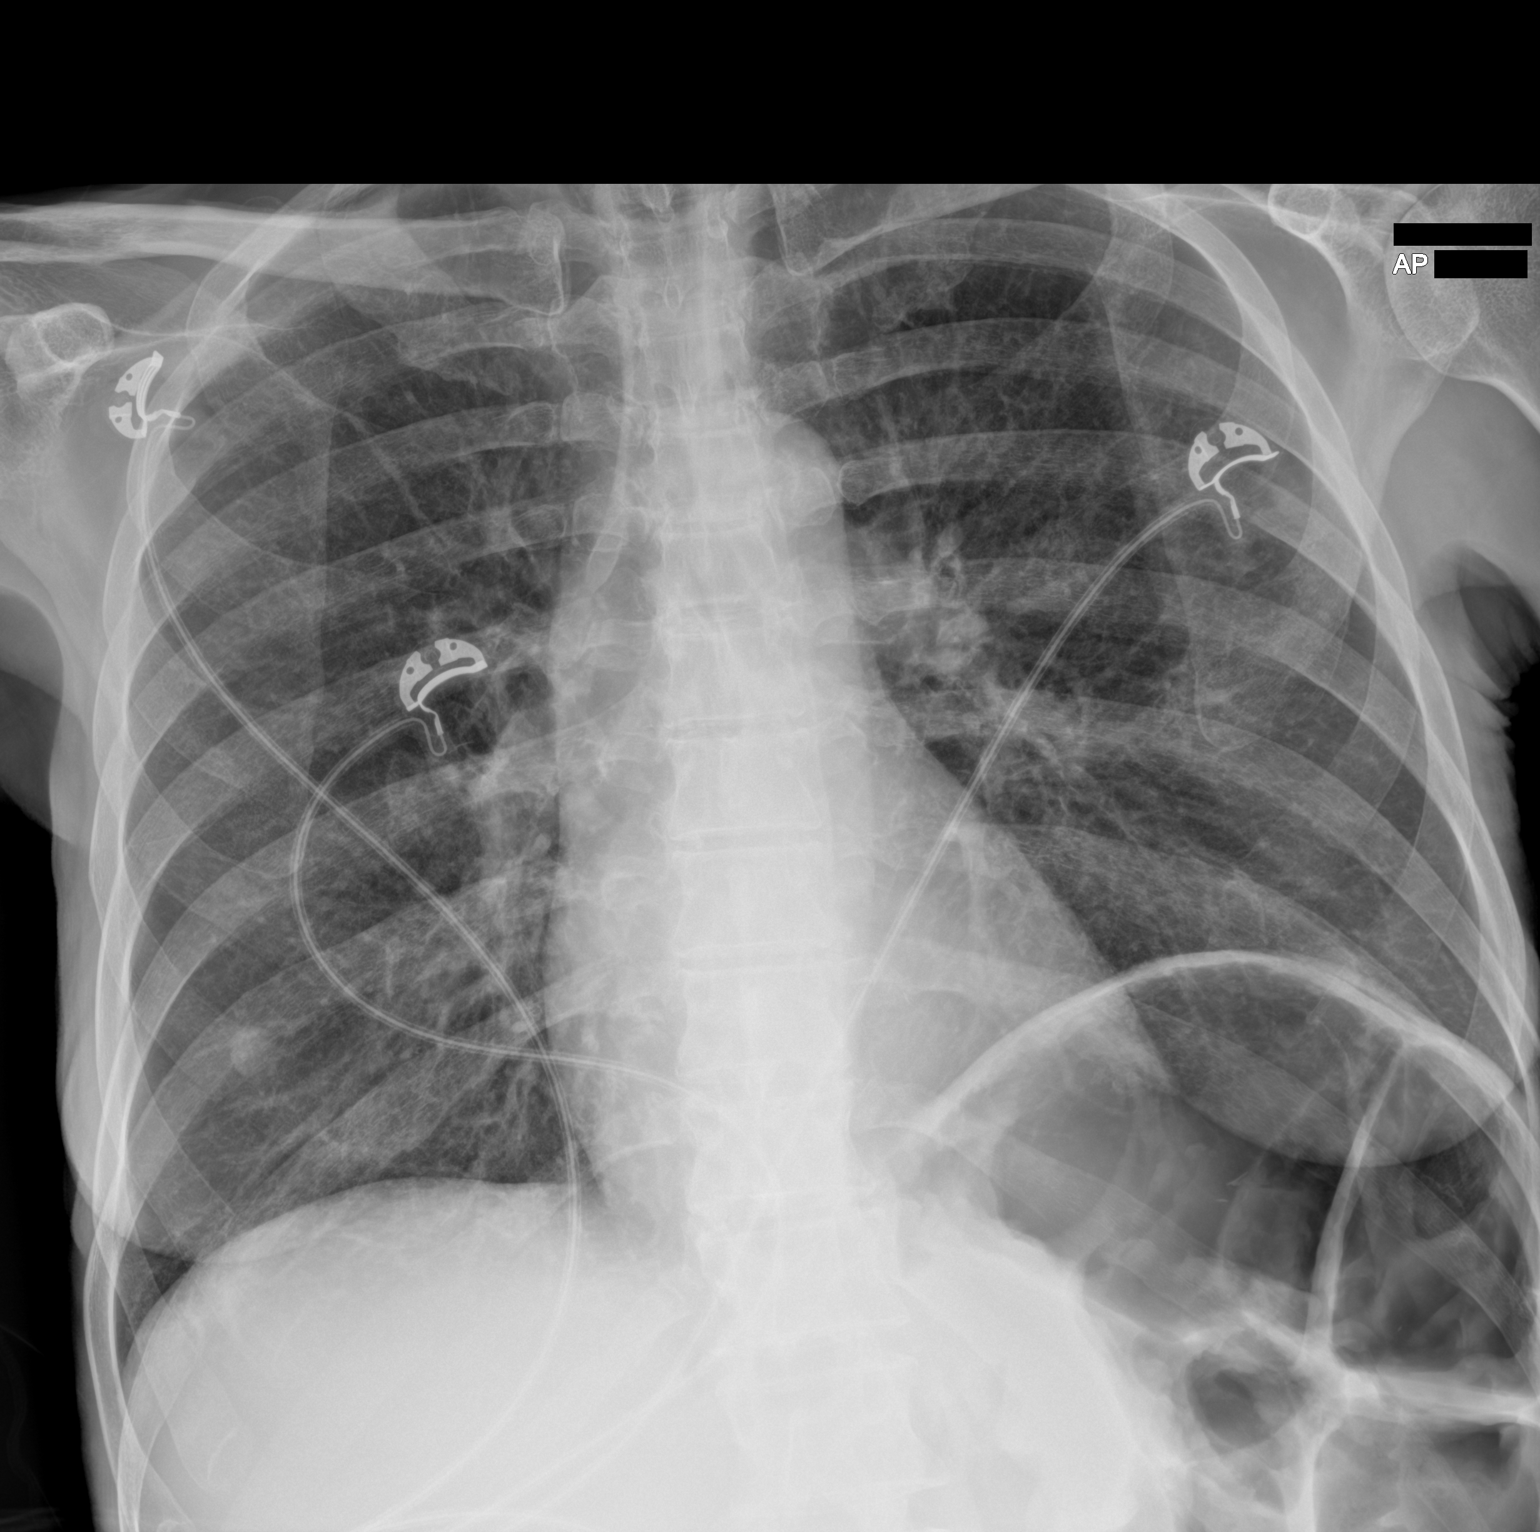
[im 2/2]
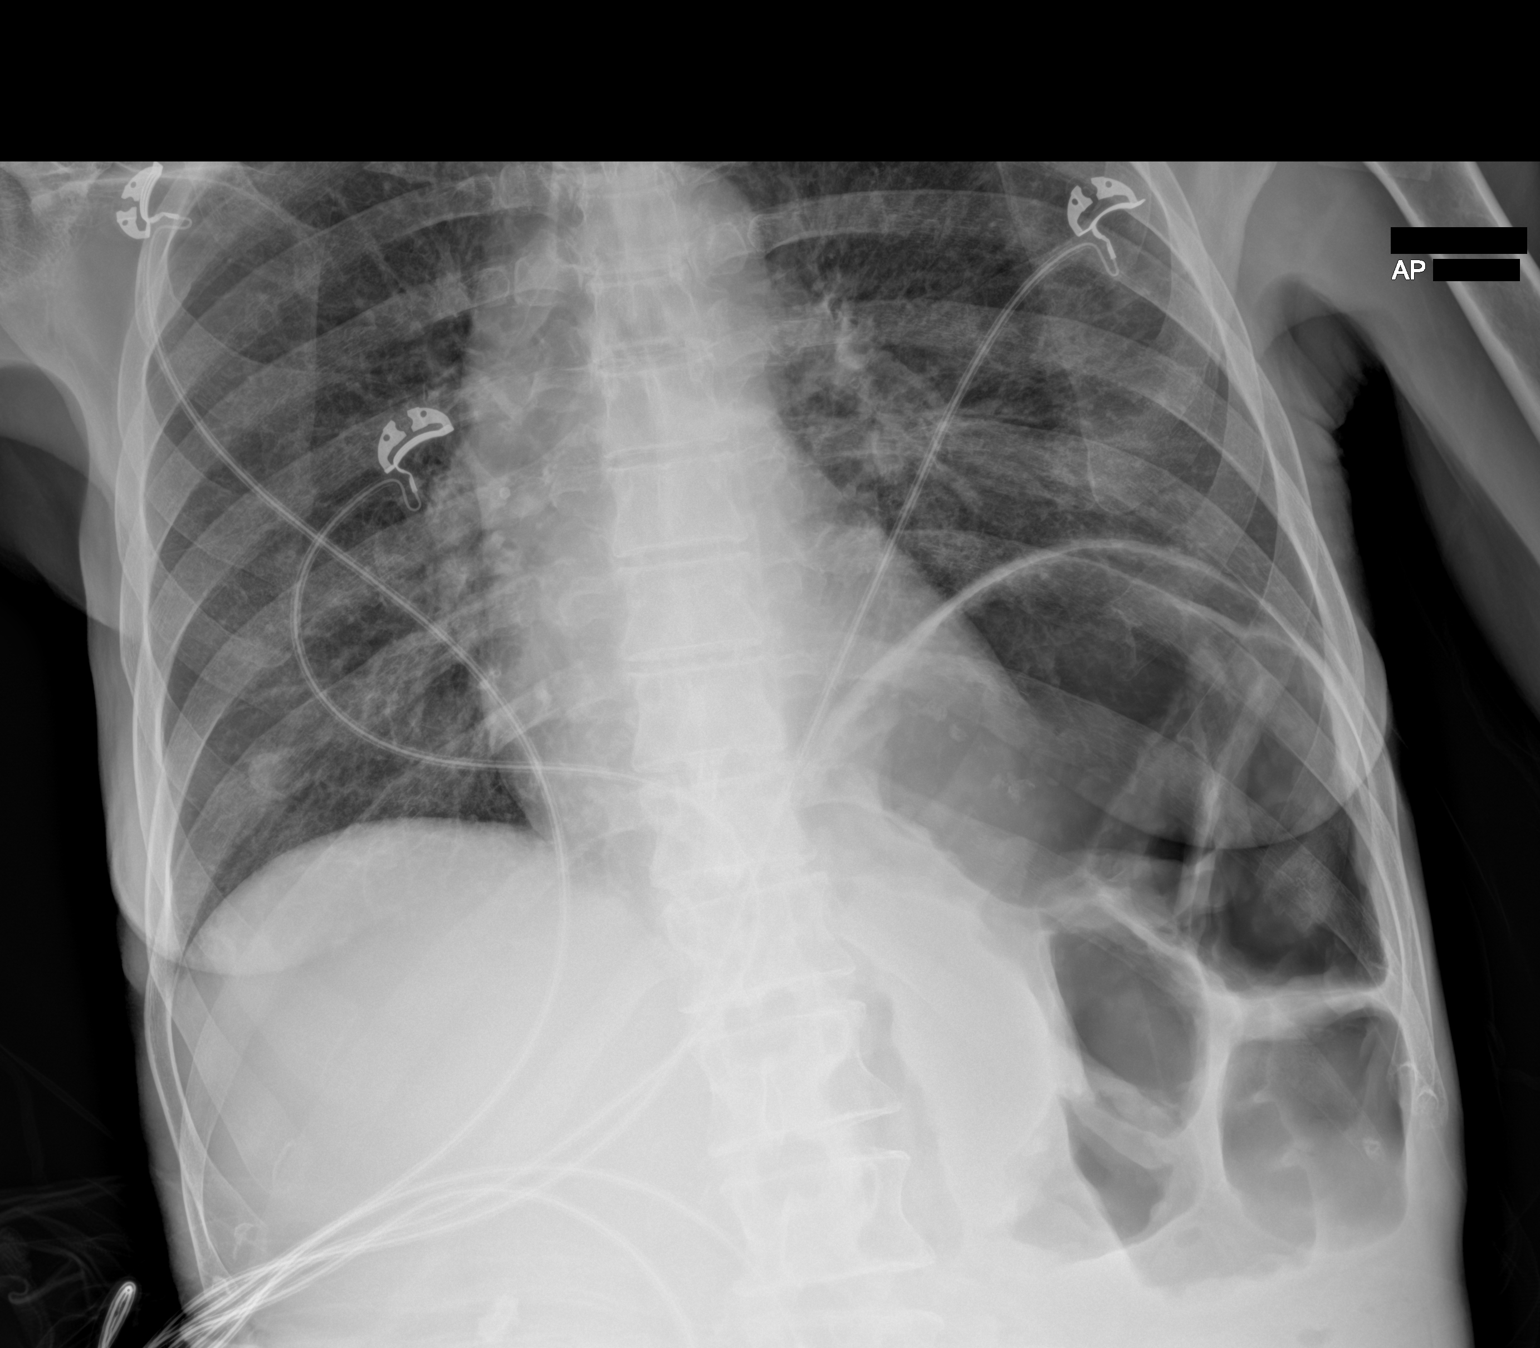

[2 of 2 positions shown; findings below may reference images not displayed]

FINDINGS: Lungs are well expanded, symmetric, and clear. No pneumothorax or
pleural effusion. Cardiac size within normal limits. Pulmonary
vascularity is normal. Osseous structures are age-appropriate. No
acute bone abnormality.
IMPRESSION: No active disease.

## 2023-01-08 IMAGING — CT CT ABD-PELV W/O CM
2 of 4 series · 15 of 46 positions shown, 17 images · non-contrast
Comparison: 07/04/2020

CLINICAL DATA: Abdominal pain, nausea, vomiting



[Series 2: routine abd/pel wo · axial · 0.75mm/px · z∈[-1196,-756]mm · 12 of 102 slices shown, 14 images]
[im 9/102  soft-tissue]
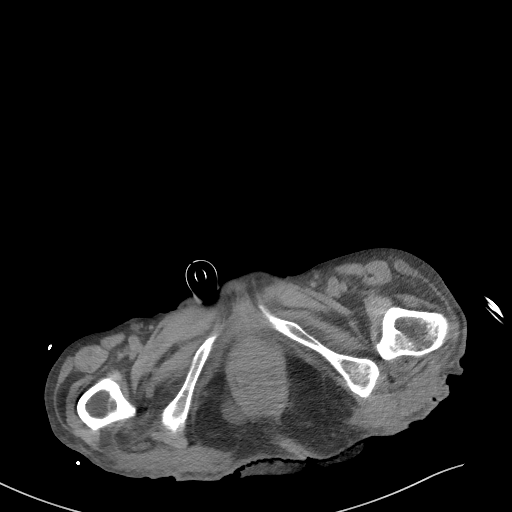
[im 9/102  bone]
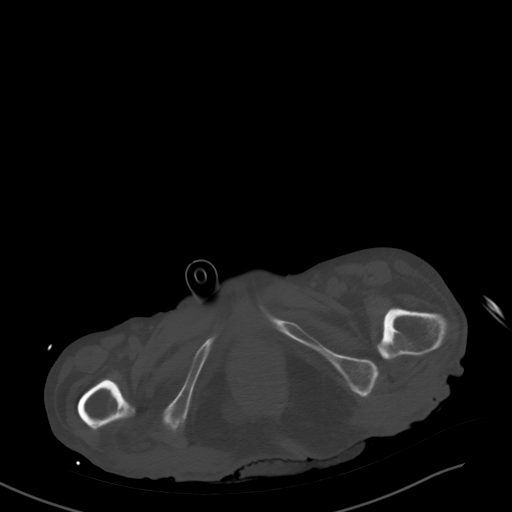
[im 17/102  soft-tissue]
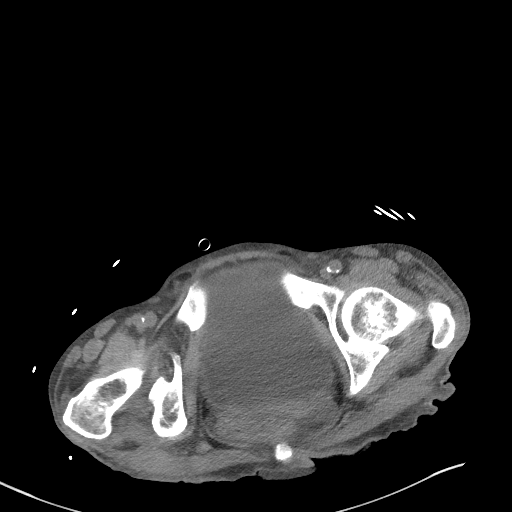
[im 25/102  soft-tissue]
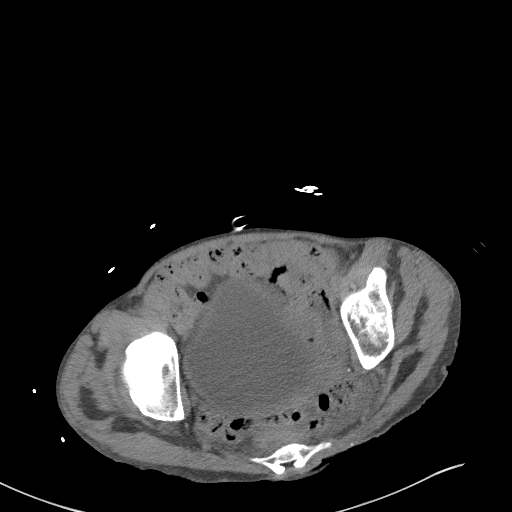
[im 33/102  soft-tissue]
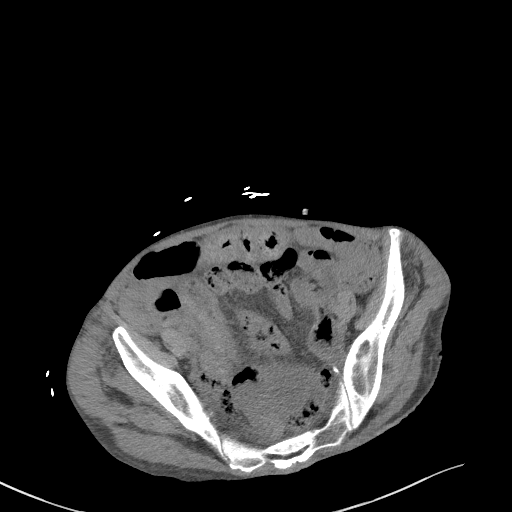
[im 41/102  soft-tissue]
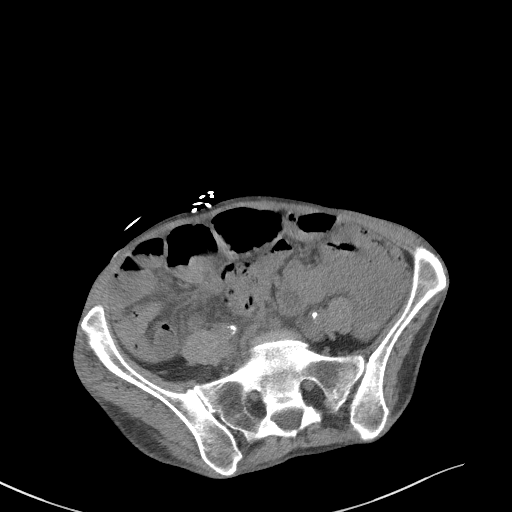
[im 49/102  soft-tissue]
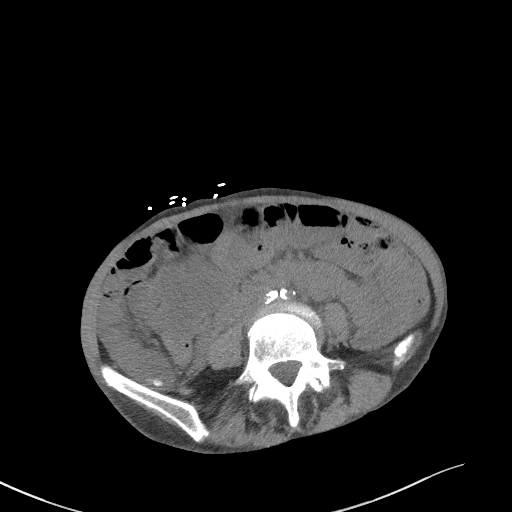
[im 57/102  soft-tissue]
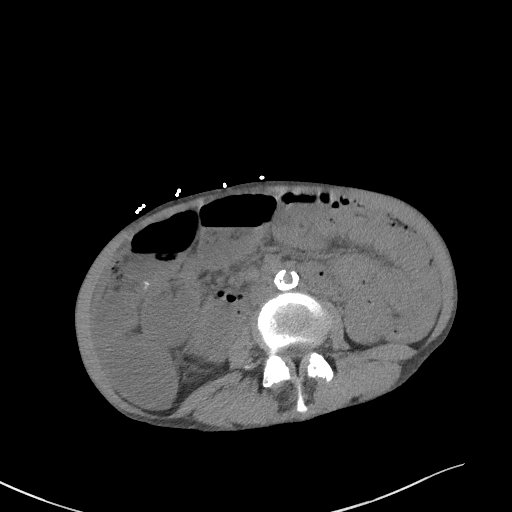
[im 65/102  soft-tissue]
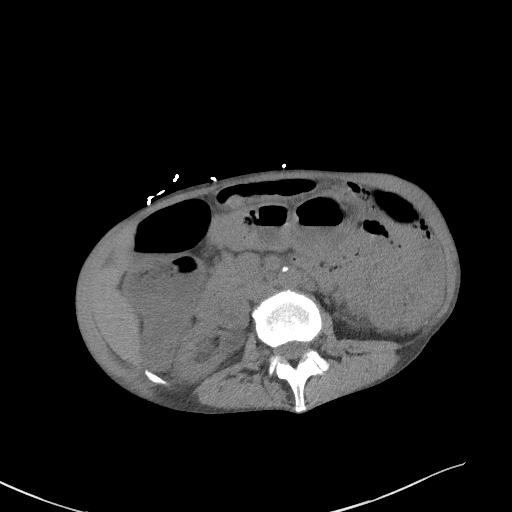
[im 73/102  soft-tissue]
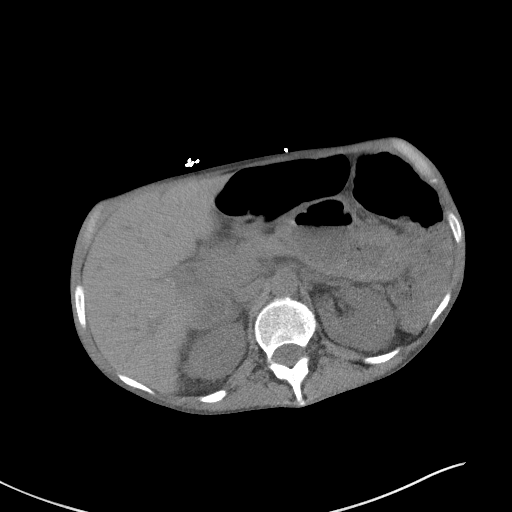
[im 73/102  bone]
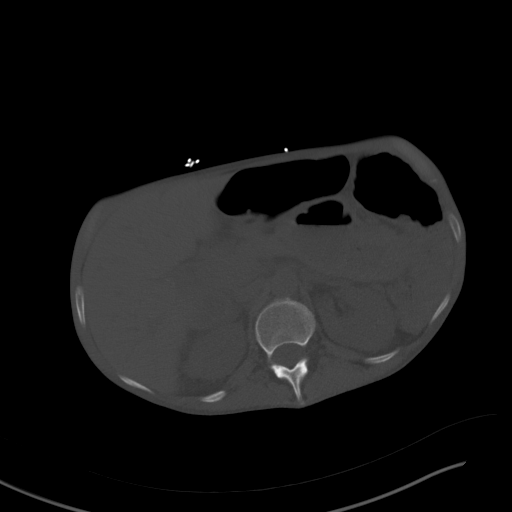
[im 81/102  soft-tissue]
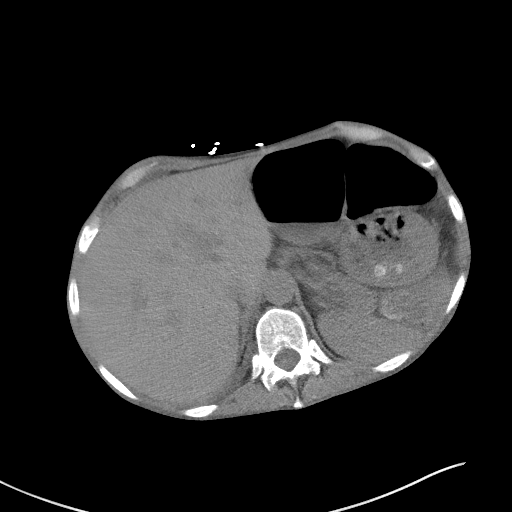
[im 89/102  soft-tissue]
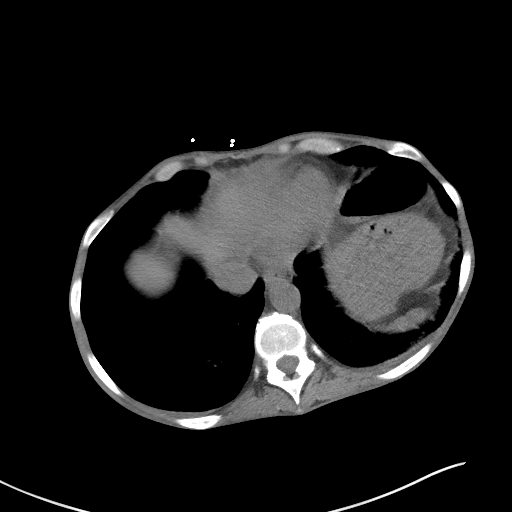
[im 97/102  soft-tissue]
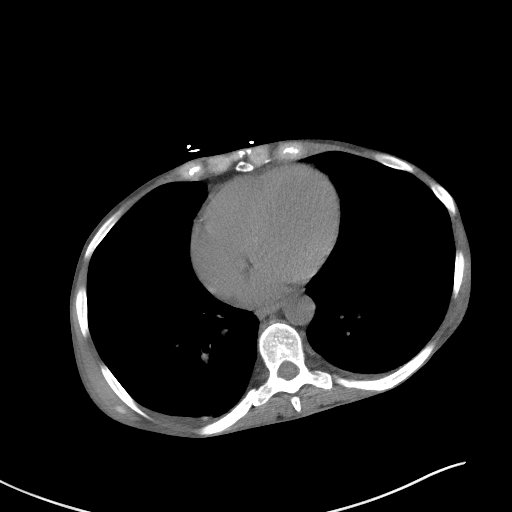

[Series 4: coronal st · coronal · 0.68mm/px · 3 of 86 slices shown]
[im 29/86  soft-tissue]
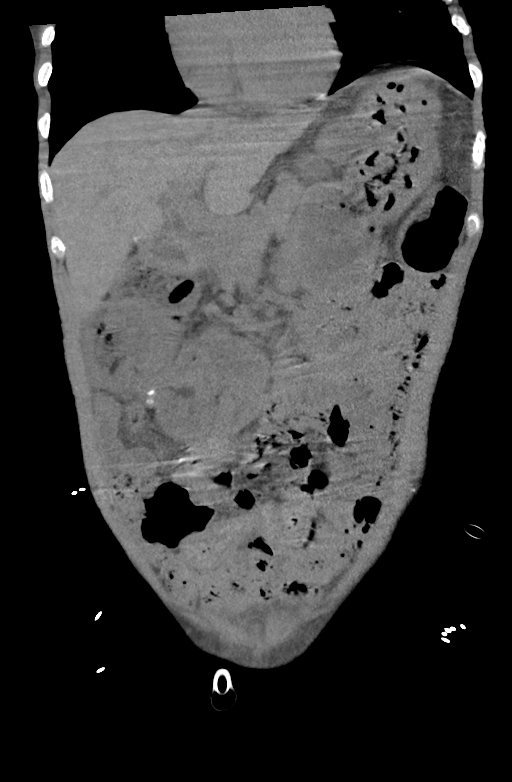
[im 38/86  soft-tissue]
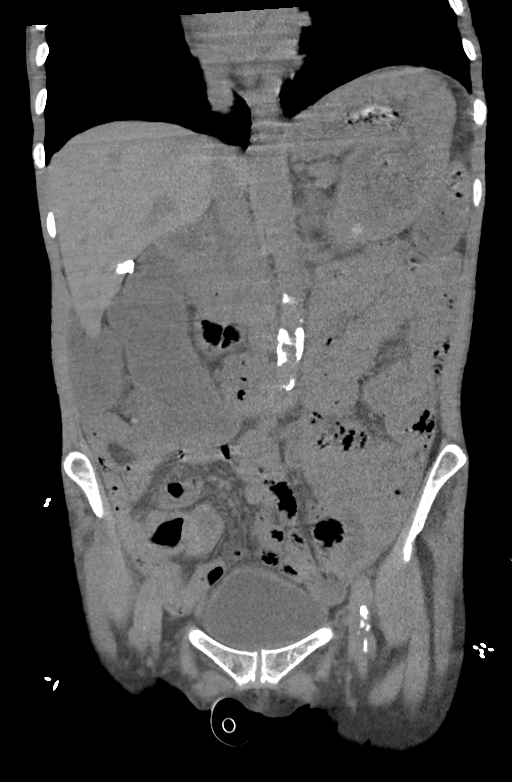
[im 48/86  soft-tissue]
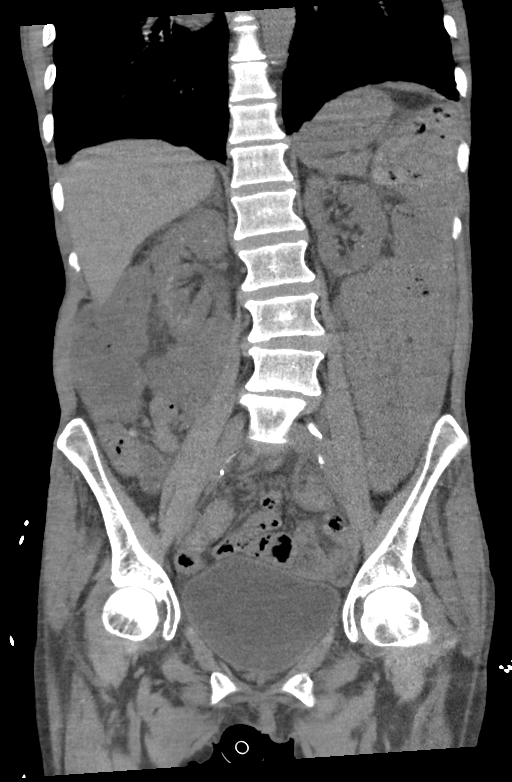

[15 of 46 positions shown; findings below may reference images not displayed]

FINDINGS: Lower chest: Small linear densities in the posterior lower lung
fields may suggest scarring or subsegmental atelectasis. Breathing
motion artifacts limit evaluation of lung fields. Minimal pleural
effusions are seen. Scattered coronary artery calcifications are
seen.

Hepatobiliary: There is coarse calcification in the right lobe with
no significant interval change. There is no significant dilation of
bile ducts. Gallbladder is not seen.

Pancreas: No focal abnormality is seen.

Spleen: Unremarkable.

Adrenals/Urinary Tract: Adrenals are unremarkable. There is no
hydronephrosis. There are tiny calcific densities in the renal
cortex in both kidneys. No definite renal stones are seen. Ureters
are not dilated. Urinary bladder is distended. There is no wall
thickening in the bladder.

Stomach/Bowel: Stomach is moderately distended. There is wall
thickening in the stomach. Duodenum is distended. There is mild to
moderate dilation of small-bowel loops. There is mild diffuse wall
thickening in the small bowel loops. Appendix is not dilated. There
is high density in the lumen of appendix. There is no focal
pericecal inflammation. There is fluid in the lumen of colon. There
is gaseous distention of transverse colon. There is no focal wall
thickening in the colon.

Vascular/Lymphatic: Calcifications are seen in aorta and its major
branches.

Reproductive: Uterus is difficult to visualize. No dominant adnexal
masses are seen.

Other: There is no pneumoperitoneum. There is minimal ascites in the
pelvis.

Musculoskeletal: There is minimal anterolisthesis at L4-L5 level
along with disc space narrowing.
IMPRESSION: There is no evidence of intestinal obstruction or pneumoperitoneum.
There is no hydronephrosis.

There is wall thickening in the stomach and small bowel loops. There
is fluid in the dilated small bowel loops. There is fluid in the
lumen of colon. There is minimal ascites. Findings suggest possible
gastritis and enterocolitis.
